# Patient Record
Sex: Female | Born: 1945 | Race: White | Hispanic: No | Marital: Married | Smoking: Current some day smoker
Health system: Southern US, Community
[De-identification: ages and names within clinical notes are randomized; demographics above are authoritative.]

## PROBLEM LIST (undated history)

## (undated) DIAGNOSIS — E785 Hyperlipidemia, unspecified: Secondary | ICD-10-CM

## (undated) DIAGNOSIS — E611 Iron deficiency: Secondary | ICD-10-CM

## (undated) DIAGNOSIS — M1990 Unspecified osteoarthritis, unspecified site: Secondary | ICD-10-CM

## (undated) DIAGNOSIS — H3530 Unspecified macular degeneration: Secondary | ICD-10-CM

## (undated) DIAGNOSIS — J449 Chronic obstructive pulmonary disease, unspecified: Secondary | ICD-10-CM

## (undated) DIAGNOSIS — Z923 Personal history of irradiation: Secondary | ICD-10-CM

## (undated) DIAGNOSIS — M5136 Other intervertebral disc degeneration, lumbar region: Secondary | ICD-10-CM

## (undated) DIAGNOSIS — R000 Tachycardia, unspecified: Secondary | ICD-10-CM

## (undated) DIAGNOSIS — I2699 Other pulmonary embolism without acute cor pulmonale: Secondary | ICD-10-CM

## (undated) DIAGNOSIS — C50919 Malignant neoplasm of unspecified site of unspecified female breast: Secondary | ICD-10-CM

## (undated) DIAGNOSIS — K219 Gastro-esophageal reflux disease without esophagitis: Secondary | ICD-10-CM

## (undated) DIAGNOSIS — K5792 Diverticulitis of intestine, part unspecified, without perforation or abscess without bleeding: Secondary | ICD-10-CM

## (undated) HISTORY — DX: Malignant neoplasm of unspecified site of unspecified female breast: C50.919

## (undated) HISTORY — DX: Diverticulitis of intestine, part unspecified, without perforation or abscess without bleeding: K57.92

## (undated) HISTORY — DX: Other pulmonary embolism without acute cor pulmonale: I26.99

## (undated) HISTORY — DX: Unspecified macular degeneration: H35.30

## (undated) HISTORY — DX: Hyperlipidemia, unspecified: E78.5

## (undated) HISTORY — PX: TONSILLECTOMY: SUR1361

## (undated) HISTORY — DX: Gastro-esophageal reflux disease without esophagitis: K21.9

## (undated) HISTORY — DX: Chronic obstructive pulmonary disease, unspecified: J44.9

## (undated) HISTORY — DX: Tachycardia, unspecified: R00.0

## (undated) HISTORY — PX: BACK SURGERY: SHX140

## (undated) HISTORY — DX: Unspecified osteoarthritis, unspecified site: M19.90

## (undated) HISTORY — PX: BREAST LUMPECTOMY: SHX2

## (undated) HISTORY — DX: Iron deficiency: E61.1

## (undated) HISTORY — DX: Other intervertebral disc degeneration, lumbar region: M51.36

## (undated) SURGERY — Surgical Case
Anesthesia: *Unknown

---

## 1968-06-02 HISTORY — PX: LAPAROSCOPIC TUBAL LIGATION: SUR803

## 2001-06-02 DIAGNOSIS — Z923 Personal history of irradiation: Secondary | ICD-10-CM

## 2001-06-02 HISTORY — DX: Personal history of irradiation: Z92.3

## 2001-10-28 ENCOUNTER — Ambulatory Visit: Admission: RE | Admit: 2001-10-28 | Discharge: 2002-01-26 | Payer: Self-pay | Admitting: Radiation Oncology

## 2003-05-22 ENCOUNTER — Encounter (HOSPITAL_COMMUNITY): Admission: RE | Admit: 2003-05-22 | Discharge: 2003-08-20 | Payer: Self-pay | Admitting: Hematology and Oncology

## 2004-06-06 ENCOUNTER — Encounter (HOSPITAL_COMMUNITY): Admission: RE | Admit: 2004-06-06 | Discharge: 2004-07-06 | Payer: Self-pay

## 2004-07-25 ENCOUNTER — Encounter (HOSPITAL_COMMUNITY): Admission: RE | Admit: 2004-07-25 | Discharge: 2004-08-24 | Payer: Self-pay

## 2004-08-26 ENCOUNTER — Encounter (HOSPITAL_COMMUNITY): Admission: RE | Admit: 2004-08-26 | Discharge: 2004-09-25 | Payer: Self-pay

## 2006-06-25 ENCOUNTER — Encounter: Admission: RE | Admit: 2006-06-25 | Discharge: 2006-06-25 | Payer: Self-pay | Admitting: Hematology and Oncology

## 2006-12-15 ENCOUNTER — Ambulatory Visit (HOSPITAL_COMMUNITY): Admission: RE | Admit: 2006-12-15 | Discharge: 2006-12-15 | Payer: Self-pay | Admitting: Internal Medicine

## 2007-11-18 ENCOUNTER — Inpatient Hospital Stay (HOSPITAL_COMMUNITY): Admission: EM | Admit: 2007-11-18 | Discharge: 2007-11-25 | Payer: Self-pay | Admitting: Emergency Medicine

## 2009-05-10 ENCOUNTER — Ambulatory Visit (HOSPITAL_COMMUNITY): Admission: RE | Admit: 2009-05-10 | Discharge: 2009-05-10 | Payer: Self-pay | Admitting: Internal Medicine

## 2009-05-31 ENCOUNTER — Ambulatory Visit (HOSPITAL_COMMUNITY): Admission: RE | Admit: 2009-05-31 | Discharge: 2009-05-31 | Payer: Self-pay | Admitting: Internal Medicine

## 2009-10-24 ENCOUNTER — Ambulatory Visit (HOSPITAL_COMMUNITY): Admission: RE | Admit: 2009-10-24 | Discharge: 2009-10-24 | Payer: Self-pay | Admitting: Internal Medicine

## 2010-06-23 ENCOUNTER — Encounter: Payer: Self-pay | Admitting: Internal Medicine

## 2010-10-15 ENCOUNTER — Ambulatory Visit: Payer: Self-pay | Admitting: Cardiology

## 2010-10-15 NOTE — Group Therapy Note (Signed)
NAME:  [name], [name] NO.:  [id number]   MEDICAL RECORD NO.:  [id number]          PATIENT TYPE:  INP   LOCATION:  A320                          FACILITY:  [facility]   PHYSICIAN:  Catalina Pizza, M.D.        DATE OF BIRTH:  1945/06/17   DATE OF PROCEDURE:  11/23/2007  DATE OF DISCHARGE:                                 PROGRESS NOTE   SUBJECTIVE:  [name] is a 65 year old white female with multiple  large PAs who was out to a telemetry bed at this time out of ICU.  She  overall is feeling much better, some occasional pain in her chest with  deep inspiration, but no other new complaints.  Eating and drinking  better.  Still does not had a bowel movement at this time.   OBJECTIVE:  VITAL SIGNS:  Temperature is 98.4, blood pressure 118/65,  pulse 93, respirations 16, sating 93% on room air.  GENERAL:  This is a thin white female lying in bed in no acute distress.  HEENT:  Unremarkable.  LUNGS:  Clear to auscultation bilaterally, showing good air movement  throughout.  Did not appreciate any rhonchi or crackles.  HEART:  Regular rate and rhythm.  No murmurs, gallops or rubs.  ABDOMEN:  Soft, nontender, nondistended, positive bowel sounds.  EXTREMITIES:  No lower extremity edema.  NEUROLOGIC:  Alert and oriented x3.  No deficits noted.   LABORATORY DATA:  PT is 20.5, INR 1.7.  Urine culture from a several  days ago shows greater than 100,000 colonies of different morphology, no  specific growth.  Anemia panel just showed RBC of 3.25, still awaiting  full hemoglobin/hematocrit at this time.  Chest x-ray revealed  subsegmental atelectasis in the right upper lobe, COPD type findings as  well.   IMPRESSION:  This is a 65 year old white female with new onset pulmonary  emboli.   ASSESSMENT AND PLAN:  1. Pulmonary emboli, INR is becoming close to therapeutic.  We will      bump up her Coumadin again at 10 mg today and continue with the      Lovenox through today as well.  2. Tachycardia.  This improved.  Continue on the Cardizem  3. Hemoptysis no further signs.  4. Anemia.  She continues to have anemia, but awaiting a CBC or H&H      today to see if it is significantly trended down further unclear      whether she is having any signs of bleed, but if it is lower than      was yesterday, we will get a CT scan of her abdomen to look for      retroperitoneal type bleed also awaiting a stool to see if she has      any blood in her stool as well.  5. Gastroesophageal reflux disease.  Continue on the Protonix.  6. History of diverticulitis.  No further signs at this time.  7. Mild dizziness.  This is improved and does not have any further      problems.   DISPOSITION:  The patient once therapeutic on Coumadin, will likely be  able to go back to home either tomorrow or Thursday as long as her blood  count is remaining stable.  Her diet has improved and encouraged to keep  restraints up.  Work with physical therapy as well.      Catalina Pizza, M.D.  Electronically Signed     ZH/MEDQ  D:  11/23/2007  T:  11/24/2007  Job:  [id number]

## 2010-10-15 NOTE — Group Therapy Note (Signed)
NAME:  [name], [name] NO.:  [id number]   MEDICAL RECORD NO.:  [id number]          PATIENT TYPE:  INP   LOCATION:  A320                          FACILITY:  [facility]   PHYSICIAN:  Catalina Pizza, M.D.        DATE OF BIRTH:  06-09-1945   DATE OF PROCEDURE:  11/24/2007  DATE OF DISCHARGE:                                 PROGRESS NOTE   SUBJECTIVE:  [name] is a 65 year old white female with multiple  large pulmonary emboli who is overall doing much better, ambulating  more, eating and drinking well.  Denies any specific complaints.  Does  note some pain with deep inspiration which has continued but overall  much better.   PHYSICAL EXAMINATION:  VITAL SIGNS:  Temperature is 98.3, blood pressure  101/55, pulse 81, respirations 16-22, and sating 95% on room air.  GENERAL:  This is a white female lying in bed, in no acute distress.  HEENT:  Unremarkable.  LUNGS:  Clear to auscultation bilaterally.  No rhonchi or wheezing.  HEART:  Regular rate and rhythm.  No murmurs, gallops, or rubs.  ABDOMEN:  Soft, nontender, nondistended, and positive bowel sounds.  EXTREMITIES:  No lower extremity edema.  NEUROLOGIC:  Alert and oriented x3.  No deficits noted.   LABORATORY DATA:  Anemia panel from yesterday full results show B12 of  455, folate of 10.2, percent saturation of iron 16, total iron of 29,  and total iron binding capacity of 187.  CBC shows a white count of 7.1,  hemoglobin 10.4, and platelet count of 407.  PT was 19.3 and INR of 1.6.  BMET shows sodium 141, potassium 4.0, chloride 103, CO2 30, glucose 113,  BUN 8, creatinine 0.58, and calcium of 9.2.   IMPRESSION:  This is a 65 year old white female with new-onset pulmonary  emboli.   PLAN:  1. Pulmonary emboli, still not therapeutic, unclear why her Coumadin      bumped down.  We will continue on the 10 mg daily and we will      recheck PT/INR in the morning.  Continue with Lovenox for now.  Did      instruct the  patient on Lovenox use and may be able to be sent home      with Lovenox if not therapeutic tomorrow.  2. Tachycardia, this is improved.  Continue on the Cardizem.  3. Hemoptysis, unclear exact etiology, but has not had any further      problems since initially.  May have been initially related to      pulmonary emboli.  4. Anemia.  CBC did show a bump up to 10.4.  This has remained stable      enough for the last 2 days.  She does have some signs of iron      deficiency but given her low TIBC as well, it could be a chronic      disease type picture as well.  Given her history of constipation,      we will hold off on the iron in the short term but  likely resume      once the patient is eating and drinking normally back at home.  5. Gastroesophageal reflux disease.  Continue on Protonix.  6. History of diverticulitis, no new signs of this.   DISPOSITION:  The patient will continue on her current therapy and will  change her pain regimen to oral Vicodin at this time.  We will continue  with physical therapy and likely will need home health set up at the  time of discharge to recheck PT/INR on routine basis depending on  whether she is therapeutic or not.  I appreciate Dr. Juanetta Gosling' input and  per his report it suggests to continue on the Coumadin therapy for least  6 months, may need to be longer than that.  Given the degree of her  pulmonary emboli, we will definitely not resume her on her Evista at the  time of discharge.      Catalina Pizza, M.D.  Electronically Signed     ZH/MEDQ  D:  11/24/2007  T:  11/25/2007  Job:  [id number]

## 2010-10-15 NOTE — Group Therapy Note (Signed)
NAME:  [name], [name] NO.:  [id number]   MEDICAL RECORD NO.:  [id number]          PATIENT TYPE:  INP   LOCATION:  IC03                          FACILITY:  [facility]   PHYSICIAN:  Catalina Pizza, M.D.        DATE OF BIRTH:  April 07, 1946   DATE OF PROCEDURE:  11/20/2007  DATE OF DISCHARGE:                                 PROGRESS NOTE   SUBJECTIVE:  [name] is a 65 year old white female who presented  with chest pain and shortness of breath, and found to have a significant  large PEs in the right and left lung.  She was seen and assessed by Dr.  Juanetta Gosling yesterday in consultation and again this morning.  Overall, [name] has improved both of chest pain and discomfort at some point as  well as her breathing.  She continues to have some difficulty, but  overall improved.  She is eating and drinking some, and does not have as  much nausea and abdominal pain still, but does have some bloating and  occasional burping sensation with mild cough.  She does also mentioned  that she did have cough with a trace of red blood, pinkish in nature,  noted this morning.   OBJECTIVE:  VITAL SIGNS:  Temperature is 97.3, blood pressure is 115/62,  pulse is ranging between 95 up to 120, most recently at approximately  105, and oxygen saturation 98% on 2 L of oxygen.  GENERAL:  This is a thin white female lying in bed in no acute distress.  HEENT:  Unremarkable.  LUNGS:  Showed okay air movement anteriorly, but question diminished at  the posterior of the right lung.  She does continue to have some  pleuritic-type chest pain with deep breathing.  No significant  tachypnea.  No other rhonchi or wheezing noted.  ABDOMEN:  Soft and nontender, positive bowel sounds.  HEART:  Regular rate and rhythm.  No murmurs, gallops, or rubs.  NEUROLOGIC:  The patient is alert and oriented x3.  No deficits.  EXTREMITIES:  On lower extremities, decreased pain on palpation in her  right calf, question quads  felt, but also has same type situation in the  left leg.  Right does appear to be mildly larger than the left, but no  significant swelling.   LABORATORY DATA:  CBC shows a white count of 11.0 and hemoglobin of  10.9.  BMET, sodium 133, potassium 3.9, chloride 97, CO2 of 30, glucose  128, BUN 90, creatinine 0.53, calcium of 9.4, PT is 14.6, and INR of  1.1.   IMPRESSION:  This is a 65 year old white female with new onset of  pulmonary emboli.   ASSESSMENT AND PLAN:  1. Pulmonary emboli, unknown exact cause of  immobility and recent      hospitalization.  Also, the patient started back on Evista which      may have contributed to clot formation.  Continue with the Lovenox      injections full dose q.12 h. as well as the Coumadin, we will      increase  the dose to 10 mg today.  We will continue O2 to help with      pain management.  We will also continue with morphine or Percocet      for severe pain.  2. Dr. Juanetta Gosling also follow along and appreciate his input.  3. Tachycardia.  Dr. Juanetta Gosling resumed her Cardizem back to several      doses per day, though not the long-acting as she was on before      which he will continue to monitor this.  4. Question hemoptysis.  The patient did have a trace of blood in her      sputum per report.  Unclear, whether this is a result of damage to      her lungs related to the pulmonary emboli plus her Coumadin therapy      contributed to it or whether there is some other etiology that      causing this.  5. Anemia.  Her hemoglobin has dropped down to 10.9 from 12.3.  No      appreciable signs of blood loss, but we will guaiac all stools.  6. Gastroesophageal reflux disease/belching.  The patient does have      some stomach irritation which may have been related to her recent      gastrointestinal issues, but we will treat with Protonix IV b.i.d.      for now.  7. History of diverticulitis.  No new signs or issues related to this.   DISPOSITION:  We  will continue in ICU for now and we will try to  encourage increased eating as well as getting up into a chair, probably  the patient will be here for another 2 or 3 days while being therapeutic  on her Coumadin.      Catalina Pizza, M.D.  Electronically Signed     ZH/MEDQ  D:  11/20/2007  T:  11/21/2007  Job:  [id number]

## 2010-10-15 NOTE — H&P (Signed)
[name], [name]                ACCOUNT NO.:  [id number]   MEDICAL RECORD NO.:  [id number]          PATIENT TYPE:  INP   LOCATION:  A317                          FACILITY:  [facility]   PHYSICIAN:  Angus G. Renard Matter, MD   DATE OF BIRTH:  [birth year]   DATE OF ADMISSION:  11/18/2007  DATE OF DISCHARGE:  LH                              HISTORY & PHYSICAL   ADDENDUM   REVIEW OF SYSTEMS:  HEENT:  Negative.  CARDIOPULMONARY:  The patient has  had some dyspnea and wheezing.  GI:  No vomiting, no diarrhea, no  melena.  GU:  No dysuria or hematuria.   PHYSICAL EXAMINATION:  GENERAL:  Alert white female complaining of chest  pain, sitting in upright position.  VITAL SIGNS:  Blood pressure 111/63, respirations 20, and pulse 106.  HEENT:  Eyes, PERRLA.  TM negative.  Oropharynx benign.  NECK:  Supple.  No JVD or thyroid abnormalities.  LUNGS:  Clear to P and A.  Diminished breath sounds.  HEART:  Regular rhythm.  No murmurs.  ABDOMEN:  No palpable organs or masses.  No organomegaly.  EXTREMITIES:  The patient does have tenderness in both calves  bilaterally.  NEUROLOGIC:  No focal deficit.   DIAGNOSES:  1. Pulmonary embolization probably from deep vein thrombosis.  2. Recent history of diverticulitis, treated conservatively.   ADDITIONAL LAB WORK:  Blood gases, pH of 7.455 with a PCO2 36.5, PO2  59.6, and bicarb 25.3.  Chem shows sodium 137, potassium 3.7, chloride  101, CO2 29, glucose 134, BUN 13, creatinine 0.60.  CBC, WBC 11,400,  hemoglobin 12.3, and hematocrit 36.2.  D-dimer 5, derivatives 11.33.   PLAN:  To start anticoagulation, Coumadin 5 mg, Lovenox 1 mg/kg b.i.d.  Nasal O2.      Angus G. Renard Matter, MD  Electronically Signed     AGM/MEDQ  D:  11/18/2007  T:  11/19/2007  Job:  [id number]

## 2010-10-15 NOTE — Group Therapy Note (Signed)
NAMENEERA, TENG                ACCOUNT NO.:  [id number]   MEDICAL RECORD NO.:  [id number]          PATIENT TYPE:  INP   LOCATION:  IC03                          FACILITY:  [facility]   PHYSICIAN:  Edward L. Juanetta Gosling, M.D.DATE OF BIRTH:  November 17, 1945   DATE OF PROCEDURE:  11/22/2007  DATE OF DISCHARGE:                                 PROGRESS NOTE   Ms. Revolorio seems to be doing better than yesterday.  She has had some  more cardiac arrhythmia but overall feels better.  She is taking deeper  breaths.  She is not having as much pain.  She has no new complaints.  No hemoptysis.  No chest pain except for some pleuritic type pain.   PHYSICAL EXAMINATION:  VITAL SIGNS:  Her heart rates in the 90s and  blood pressure 130 systolic.  CHEST:  Clear.  HEART:  Occasional extrasystole.  ABDOMEN:  Soft.   She wants to know about taking some eye drops from home that she says is  for some sort of a retinal problem.  I do not think it should interfere  with anything, so I think that is okay.   LABORATORY DATA:  Her lab work today shows that her BMET, CO2 is 33,  glucose is 111, otherwise normal.  CBC; white count 6400, hemoglobin  9.5, and platelets 316.  Protime is 19.5 with an INR of 1.6.   ASSESSMENT:  She has had some cardiac arrhythmias.  I think pretty much  to be expected in someone who has had massive pulmonary emboli,  particularly someone who has had cardiac arrhythmias in the past.  She  has problems with some sort of retinal difficulty, and she is on eye  drops for that.  I think it is okay for her to take at home.  She has  pulmonary emboli and seems to be making progress with that.  She is  starting to get anticoagulated with Coumadin.      Edward L. Juanetta Gosling, M.D.  Electronically Signed     ELH/MEDQ  D:  11/22/2007  T:  11/22/2007  Job:  [id number]

## 2010-10-15 NOTE — Group Therapy Note (Signed)
[name], [name]                ACCOUNT NO.:  [id number]   MEDICAL RECORD NO.:  [id number]          PATIENT TYPE:  INP   LOCATION:  IC03                          FACILITY:  [facility]   PHYSICIAN:  Edward L. Juanetta Gosling, M.D.DATE OF BIRTH:  [birth year]   DATE OF PROCEDURE:  DATE OF DISCHARGE:                                 PROGRESS NOTE   PROBLEM:  Extensive pulmonary emboli.   SUBJECTIVE:  [name] says she is feeling a little bit better.  She  is able to move around a little bit more.  She has no other new  complaints.  In addition to her previously noted hospitalizations and  bedrest she also has had risk factor of taking Evista that may have  increase her risk of pulmonary emboli.  Today, she says she is able to  move a little better.  She looks better.  She is more comfortable.  Her  heart rate still about 120.  She has been on Cardizem so I am going to  restart that.  Her blood pressure in the 120s, O2 sats in the mid 90s on  2 liters.  Her chest clear.  She is moving air better.   Her CBC shows a white count 11,000, hemoglobin is 10.9, and platelets of  330.  BMET:  BUN is 9, creatinine 0.53, and prothrombin time 14.61, and  INR of 1.1.   ASSESSMENT:  She is better.   PLAN:  To continue with treatments as follow with no changes today.  I  will add her Cardizem back, and I went ahead and did a short-acting  version for now.      Edward L. Juanetta Gosling, M.D.  Electronically Signed     ELH/MEDQ  D:  11/20/2007  T:  11/20/2007  Job:  [id number]

## 2010-10-15 NOTE — Consult Note (Signed)
[name], [name]                ACCOUNT NO.:  [id number]   MEDICAL RECORD NO.:  [id number]          PATIENT TYPE:  INP   LOCATION:  IC03                          FACILITY:  [facility]   PHYSICIAN:  Edward L. Juanetta Gosling, M.D.DATE OF BIRTH:  [birth year]   DATE OF CONSULTATION:  11/19/2007  DATE OF DISCHARGE:                                 CONSULTATION   REASON FOR CONSULTATION:  Bilateral pulmonary emboli.   HISTORY:  [name] is a 65 year old who had been in fairly good  health until about a month ago.  About a month ago, she developed  abdominal discomfort, went to the [facility], she was admitted  there, was being treated with antibiotics, but continued having  discomfort, was eventually transferred to [facility] where she was treated.  There was some question at that time  about whether she had a blood clot somewhere, that may have been in her  bladder area.  She had an ultrasound done that was negative for clots,  so she was treated simply with typical subcutaneous prophylactic dose of  what sounds like heparin, she said she was taking it 3 times a day.  Since then, she was discharged home, then she developed severe knee pain  in her left knee, went to see Dr. Arletha Grippe, an orthopedic surgeon, who  thought she might have gout, aspirated her knee, he did not find any  evidence of gout, and he injected her knee with steroids.  Several days  after that, she developed severe back pain, which went up into her  chest.  She then about 36 hours ago developed pain in her right side  mostly in the chest, it became quite severe.  She came to the emergency  room where she was found to have pulmonary emboli.  She came to the  emergency room where she was admitted.  Since then, she has had  increasing problems with pain and was transferred to the ICU because of  her increasing pain.  She says she is still having significant pain.  She is short of breath.  She is not  able to lie flat.   PAST MEDICAL HISTORY:  Positive for a history of breast cancer.  She  received chemotherapy and radiation post the breast cancer.  She is not  on any of these oral agents.  She has a history of rapid heartbeat and  takes Cardizem for that.  She has a history of hyperlipidemia, for which  she takes Lipitor.   REVIEW OF SYSTEMS:  Except as mentioned is negative.   FAMILY HISTORY:  Her father had cardiac disease and had thick blood  and ended up on Coumadin as well.  Her mother had excellent physical  health, but had Alzheimer's and died from that.   SOCIAL HISTORY:  She does not smoke now, stopped about a month ago when  she got sick, but has a significant smoking history.   PHYSICAL EXAMINATION:  GENERAL:  She appears to be mildly uncomfortable  now.  VITAL SIGNS:  Pulse 102, blood pressure 108/64, and respirations about  20.  HEENT:  Pupils are reactive.  Her nose and throat are clear.  NECK:  Supple without masses.  CHEST:  Fairly clear.  HEART:  Regular without gallop.  ABDOMEN:  Soft.  EXTREMITIES:  No edema.  She has some tenderness in the lower  extremities bilaterally.  I do not feel a definite cord.  CNS:  Grossly intact.   LABORATORY DATA:  White blood count is 11,400, hemoglobin is 12.3, and  platelets 402.  Amylase 43.  BMET; glucose is 134, otherwise normal.  Lipase was 21.  Blood gas on room air showed a pO2 of 59, pCO2 of 36,  and pH 7.45.  D-dimer was 11.33.  Urinalysis negative.  BNP was 46.9.  Protime this morning, INR was 1.1 and protime of 14.2.  Her CT angiogram  of the chest is strongly positive with large filling defects in both  pulmonary arteries and large saddle embolus in the right pulmonary  artery extending into the right upper lobe, right lower lobe, and right  middle lobe branches.  I expect that she has probably got some pulmonary  infarction as well based on the fact that she should have an infiltrate.  If she becomes febrile,  she needs antibiotics.   ASSESSMENT:  She has bilateral pulmonary emboli, which are large,  probably precipitated by her being hospitalized.  She is on Lovenox,  which is appropriate.  She is on Coumadin, which is appropriate.  I do  not think there is anything to add at this point.  She is  hemodynamically stable, so she is not a candidate I think for  prophylaxis and would watch hemodynamics carefully and continue with her  treatments.  She is already started on Coumadin, which is appropriate.  She may need a workup for hypercoagulable state, but I do think she has  a reasonable precipitating cause with this hospitalization.  She has  medications for pain which is also appropriate.  I do not think there is  anything else to add at this point.   Thank you very much for allowing me to see her with you.      Edward L. Juanetta Gosling, M.D.  Electronically Signed     ELH/MEDQ  D:  11/19/2007  T:  11/20/2007  Job:  [id number]

## 2010-10-15 NOTE — Group Therapy Note (Signed)
[name], [name]                ACCOUNT NO.:  [id number]   MEDICAL RECORD NO.:  [id number]          PATIENT TYPE:  INP   LOCATION:  A320                          FACILITY:  [facility]   PHYSICIAN:  Edward Diana. Juanetta Gosling, M.D.DATE OF BIRTH:  01-19-1946   DATE OF PROCEDURE:  11/23/2007  DATE OF DISCHARGE:                                 PROGRESS NOTE   PATIENT OF:  Catalina Pizza, MD   [name] is better.  She is up and moving around.  She has no new  complaints.   PHYSICAL EXAMINATION:  VITAL SIGNS:  Today shows; temperature 97.8,  pulse 90, respirations 20, blood pressure 129/68, and O2 sats 94% on  room air.  Her PT is 20.5 with an INR of 1.7.   ASSESSMENT:  She has had pulmonary emboli.  She is recovering from that  and seems to be doing very well, and I am going to plan to follow her  more peripherally.   Thanks for allowing me to see her with you.  She will need 6 months of  outpatient oral anticoagulation.      Edward Diana. Juanetta Gosling, M.D.  Electronically Signed     ELH/MEDQ  D:  11/23/2007  T:  11/23/2007  Job:  [id number]

## 2010-10-15 NOTE — H&P (Signed)
[name], [name]                ACCOUNT NO.:  [id number]   MEDICAL RECORD NO.:  [id number]          PATIENT TYPE:  INP   LOCATION:  A317                          FACILITY:  [facility]   PHYSICIAN:  Angus G. Renard Matter, MD   DATE OF BIRTH:  [birth year]   DATE OF ADMISSION:  11/18/2007  DATE OF DISCHARGE:  LH                              HISTORY & PHYSICAL   A 65 year old white female presented to the ED with chief complaint  being discomfort in her chest and shortness of breath.  Apparently, this  began today although she had noticed some pain in the left knee and had  seen Dr. Pecola Lawless with reference to this and noted some pain in both  lower legs she has not had any hemoptysis, but said some dyspnea.  Apparently, she was admitted to the hospital both in [place] and at  [facility] for treatment of diverticulitis.  This apparently was  conservative treat with antibiotics no surgery.  The patient was  evaluated by ED physician.  CT of the chest was done following elevation  of D-dimer and regular chest x-ray.  Chest x-ray showed bronchitic  changes and scattered area of parenchymal lung scarring left suprahilar  nodular density.  Chest CT showed evidence of pulmonary embolization.  The patient was given Lovenox in the emergency department and was  started on nasal O2 and subsequently admitted.   MEDICAL HISTORY:  The patient has history of breast cancer.  She has had  previous back surgery.   SOCIAL HISTORY:  She was a former smoker.  Does not use drugs or does  not smoke.   ALLERGIES:  No known no drug allergies.   MEDICATION LIST:  Vicodin p.r.n.   Dictation ended at this point.      Angus G. Renard Matter, MD  Electronically Signed     AGM/MEDQ  D:  11/18/2007  T:  11/19/2007  Job:  [id number]

## 2010-10-15 NOTE — Group Therapy Note (Signed)
NAME:  [name], [name] NO.:  [id number]   MEDICAL RECORD NO.:  [id number]          PATIENT TYPE:  INP   LOCATION:  IC03                          FACILITY:  [facility]   PHYSICIAN:  Catalina Pizza, M.D.        DATE OF BIRTH:  1946-04-12   DATE OF PROCEDURE:  11/22/2007  DATE OF DISCHARGE:                                 PROGRESS NOTE   SUBJECTIVE:  [name] is a 65 year old white female with multiple  large PEs, currently in the ICU.  Overall, feeling much better;  decreased pain in her chest, although she did note one episode of  different pain than she has had.  Overall, eating and drinking well,  able to get out of bed, denies any specific shortness of breath at this  time.  No signs of bleed.  No coughing up blood.   OBJECTIVE:  VITAL SIGNS:  Temperature is afebrile, blood pressure is  ranging between 100/66 to 115/56, respiratory rate 16-18, sating 99% on  2 liters of oxygen.  GENERAL:  This is thin white female, sitting on flatbed, in no acute  distress.  HEENT:  Unremarkable.  LUNGS:  Show somewhat better air movement in the right lower base  compared to the left.  HEART:  Regular rate and rhythm.  No murmurs, gallops, or rubs.  ABDOMEN:  Soft, nontender, nondistended with positive bowel sounds.  EXTREMITIES:  No lower extremity edema.  No significant pain to  palpation in the calf area as before.  NEUROLOGIC:  The patient is alert and oriented x3.  No deficits.   LABORATORY DATA:  BMET shows sodium 139, potassium 4.2, chloride 102,  CO2 33, glucose 111, BUN was 7, creatinine 0.57, and calcium of 9.  CBC  shows white count of 6.4, hemoglobin 9.5, hematocrit 28.1, platelet  count of 316.  PT is 19.5, INR of 1.6.   IMPRESSION:  A 65 year old white female with new onset of pulmonary  emboli.   ASSESSMENT/PLAN:  1. Pulmonary emboli.  Her INR is coming up not quite at the      therapeutic range at this time, but continue with Lovenox and      Coumadin; Evista  was stopped.  2. Tachycardia is much improved, change the Cardizem once daily.  3. Hemoptysis.  No further problems with this.  4. Anemia.  She continued to have this trending down, unclear where      this blood could be going; get guaiac stools and get anemia panel      in the morning, may also get a chest x-ray if continues to trend      down further tomorrow, need to do a CT scan of her abdomen and      pelvis looking for any possible retroperitoneal bleed, although I      am not sure exactly where this would be.  5. Gastroesophageal reflux disease, Protonix is helping.  6. History of diverticulitis, this apparently is better.  No signs of      bleeding as of yet in her stools.  7. Mild dizziness.  This is secondary to debility and possibly some      orthostasis.  As the patient gets more ambulatory, we will      reassess.   DISPOSITION:  The patient will be sent out of the ICU today, and we will  recheck all lab work in the morning.      Catalina Pizza, M.D.  Electronically Signed     ZH/MEDQ  D:  11/22/2007  T:  11/22/2007  Job:  [id number]

## 2010-10-15 NOTE — Discharge Summary (Signed)
NAMEHAYLEEN, [name] NO.:  [id number]   MEDICAL RECORD NO.:  [id number]          PATIENT TYPE:  INP   LOCATION:  A320                          FACILITY:  [facility]   PHYSICIAN:  Catalina Pizza, M.D.        DATE OF BIRTH:  [birth year]   DATE OF ADMISSION:  11/18/2007  DATE OF DISCHARGE:  06/25/2009LH                               DISCHARGE SUMMARY   DISCHARGE DIAGNOSES:  1. Multiple pulmonary emboli.  2. Tachycardia.  3. Hemoptysis.  4. Iron deficiency anemia.  5. Gastroesophageal reflux disease.  6. History of diverticulitis.  7. Chronic obstructive pulmonary disease.   DISCHARGE MEDICATIONS:  1. Folic acid 400 mcg p.o. daily.  2. Cardizem 360 mg p.o. daily.  3. Lipitor 40 mg p.o. daily.  4. Caltrate plus D with extra vitamin D b.i.d.  5. Metamucil 2 tablets p.o. daily.  6. Omega-3 1000 mg p.o. daily.  7. Multivitamin p.o. daily.  8. Vicodin 5/500 one tablet q.6 h. p.r.n. #40 given.  9. Coumadin 10 mg p.o. daily at 6:00 p.m. daily.  10.Lovenox 70 mg subcu two times a day until instructed otherwise.  11.Protonix 40 mg 1 tablet daily.   BRIEF HISTORY OF PRESENT ILLNESS:  Ms. [name] is a pleasant 65 year old  white female who had a recent prolonged hospitalization approximately 8-  10 days seen at  [facility] and at [facility], had some leg  discomfort bilaterally and woke up on November 18, 2007, with worsening  shortness of breath.  She came into the emergency department and was  found to have multiple large pulmonary emboli and was admitted for  further anticoagulation therapy.   PHYSICAL EXAM AT TIME OF DISCHARGE:  VITAL SIGNS:  Temperature is 98.3,  blood pressure 121/66, pulse 83, respirations 16, sating 95% on room  air.  GENERAL:  This is a white female lying in bed in no acute distress.  HEENT:  Unremarkable.  Pupils equal, round and reactive to light and  accommodation.  LUNGS:  Showed clear to auscultation bilaterally.  No rhonchi or  wheezing.   Does have improved aeration at the bases as compared to  initial exam.  Decrease pleuritic chest pain with deep inspiration.  HEART:  Regular rate and rhythm.  No murmurs, gallops or rubs.  ABDOMEN:  Soft, does have some mild suprapubic tenderness but mostly  related to when her body is in full extension, seems  musculoskeletal in  nature, is nondistended and positive bowel sounds.    EXTREMITIES:  No lower extremity edema.  NEUROLOGIC:  Alert and oriented x3.  No deficits noted.   LABORATORY DATA OBTAINED DURING HOSPITALIZATION:  Initial CBC showed a  white count of 11.4, hemoglobin of 12.3, platelet count of 402,  hemoglobin trended down to as low as 9.5 and day before discharge,  hemoglobin was stable at 10.4.  Her initial ABG obtained on room air  showed pH of 7.45, pCO2 of 36.5, pO2 of 59.6.  Initial D-dimer was  11.33.  Anemia panel obtained showed iron level 29, total iron binding  capacity of 187, percent saturation  of 16, vitamin B12 of 455, folate of  10.2, ferritin of 197.  PT at time of discharge 22.0, INR of 1.8.   SCANS OBTAINED DURING HOSPITALIZATION:  Initial chest x-ray revealed  bronchiectatic changes with scattered parenchymal lung scarring and  question of left suprahilar nodular density.  CT scan showed extensive  pulmonary emboli involving right upper, middle and lower lobes and left  lower lobe minimal infiltrate right lung base and right pleural  effusion, underlying chronic changes.  I did not appreciate any other  findings seen on chest x-ray.  Of note, did mention a large saddle  emboli in the bifurcation of right pulmonary artery.  A follow-up chest  x-ray showed subsegmental atelectasis on right upper lobe and COPD.   HOSPITAL COURSE PER PROBLEM:  1. Pulmonary emboli.  The patient was started on full anticoagulation      with Lovenox and Coumadin.  I did get Dr. Juanetta Gosling opinion as well      and we will continue with the Coumadin therapy.  She has been  slow      to increase with this and the patient does know the risks of and      the need, importance of being therapeutic on her Coumadin between 2      and 3, would aim for goals 2.5 given the extensive nature of her      pulmonary emboli bleeding clinically that it may have been a      combination of Evista, as well as sedentary nature of recent      illness that caused these venous thrombosis and pulmonary embolis      likely related to her legs.  She did have an increased right leg      compared to the left initially on exam when she came into the      hospital but this resolved at the time of discharge.  The patient      will likely need to be on Coumadin for at least 6 months likely      closer to 9-12 months depending on improvement in this.  May want      to reassess to assure that these emboli are improving after 4-6      weeks as far as reabsorption.  She is given some Vicodin for a      pleuritic type chest pain.  She did require several doses of      morphine during hospitalization for increasing her inspiration but      otherwise is doing well.  2. Tachycardia.  She had tachycardia initially likely related to      pulmonary emboli.  She does have a history of tachycardia and was      on Cardizem for this and we will continue this.  She is baseline      approximately 80-90 during hospitalization with occasional going up      in the 130s, but has resolved at the time of discharge.  3. Hemoptysis.  She did have signs of coughing up blood initially only      small amounts and only lasted for less than a day.  She has not had      any further episodes since then.  4. Anemia.  In reviewing her anemia panel, it did show that she has      iron deficiency anemia.  We will likely start her on some iron once      the patient is getting back  to normal.  Her hemoglobins remained      stable approximately 10.4 drop from 12.3-10.4, may have been      secondary to multiple blood draws, as  well as dilutional given the      fact she was getting fluids initially.  Unclear exact baseline for      this.  5. Gastroesophageal reflux disease/belching.  She was started on      Protonix and this is much improved.  6. History of diverticulitis.  She continues to do well.  She has had      some constipation and did have bowel movement yesterday. Continue      with routine treatment of this.  Her diverticulitis was adequately      treated and has not had any fevers signs at this time but did      recommend from [facility] that she get a follow-up colonoscopy which      we will arrange with Dr. Loreta Ave in next 4-6 weeks.  I do not feel it      is urgently important given the fact that she is on Coumadin right      now.  We will need to decide how to move forward with this.   DISPOSITION:  The patient will be discharged on only mentioned above  medicines on Lovenox that she will be educated on and give herself  daily.  She is to get home health nursing to come out and assist the  patient with this and check a PT/INR tomorrow, as well as Monday and  adjust the dose appropriately.  She is to continue on her Lovenox  until alerted not to. We will reassess on Monday per phone conversation  and see when the patient is to follow-up with me in the office.  The  patient is aware of all this and is in agreement that she will do better  at home.  We will need to keep close follow-up on this.      Catalina Pizza, M.D.  Electronically Signed     ZH/MEDQ  D:  11/25/2007  T:  11/25/2007  Job:  [id number]

## 2010-10-15 NOTE — Group Therapy Note (Signed)
NAME:  [name], [name] NO.:  [id number]   MEDICAL RECORD NO.:  [id number]          PATIENT TYPE:  INP   LOCATION:  IC03                          FACILITY:  [facility]   PHYSICIAN:  Catalina Pizza, M.D.        DATE OF BIRTH:  07-Jun-1945   DATE OF PROCEDURE:  DATE OF DISCHARGE:                                 PROGRESS NOTE   Of note, history and physical was performed by Dr. Renard Matter, but is  incomplete halfway through Dr. Renard Matter aware of this.   SUBJECTIVE:  [name] continues to have chest pain and discomfort  related to multiple large PEs, which she has.  She continues to have  pain in her legs bilaterally right greater than left.  She is also  having some anxiety issues as well.   BRIEF HISTORY:  The patient was at [facility] for 8-10 days and  went home approximately 1 week ago, was seen for the first time in my  office 2 days ago and with complain of some bilateral musculoskeletal  type pain and seeing Dr. Arletha Grippe for this.  She is continuing to have  fatigue apparently per Dr. Renard Matter' note.  She started having some  shortness of breath and chest discomfort and came to the Emergency  Department for further assessment and was found to have multiple  pulmonary emboli.  She was admitted, put on Lovenox, and admitted to the  third floor.   PHYSICAL EXAMINATION:  VITAL SIGNS:  Temperature is 97.8, blood pressure  is 96/57, pulse 103, respirations 20, satting 97% on room air.  GENERAL:  This is a white female sitting in chair in no acute distress.  HEENT:  Unremarkable.  Pupils equally round and reactive  to light and  accommodation.  LUNGS:  Clear to auscultation bilaterally.  Question with some mild  crackles at the bases bilaterally.  She did have some pleuritic type  chest pain with deep breathing, right greater than left.  ABDOMEN:  Soft, nontender, nondistended, positive bowel sounds.  HEART:  Regular rate and rhythm.  No murmurs, gallops, or rubs.  NEUROLOGIC:  The patient alert and oriented x3, some mild anxiety and  discussion with the patient.   Laboratory data was obtained, but not in dictation. White count of 11.4,  hemoglobin of 12.3, platelet count of 402,000, amylase of 43.  BMET only  showed mildly elevated glucose of 134, lipase was 21.  ABG did show pH  of 7.45, pCO2 of 36.5, low pO2 of 59.6.  D-Dimer was very elevated at  11.3.  UA showed small amount of blood, but specific gravity greater  than 1.030.  BNP was 46.9.  PT/INR was 13.7, INR of 1.0.   A CT angio of chest did reveal pulmonary emboli, but did have extensive  pulmonary emboli involving right upper, middle, and lower lobes and left  lower lobe, minimal infiltrate in the right lung base and right pleural  effusion.  In further looking at this, she has a large saddle embolus  identified at the bifurcation of  right pulmonary artery extending  to  the right upper lobe, right lower lobe, and right middle lobe branches.   IMPRESSION:  This is a 65 year old white female with recent  hospitalization for diverticulitis who has extensive pulmonary emboli.   ASSESSMENT/PLAN:  Pulmonary emboli/large saddle emboli.  This is life-  threatening in nature and will move the patient to the Intensive Care  unit.  She is to continue on the Lovenox for full anticoagulation as  well as Coumadin therapy.  We will place on telemetry and continue to  monitor her.  She is to limit her mobility in fear that small additional  emboli could tip over this large saddle emboli.  The patient is aware of  the severity of this condition.  We will get Dr. Juanetta Gosling to offer up any  further input from a pulmonary perspective.  She continues to have some  shortness of breath very likely to the large degree of pulmonary emboli  and should improve.  We will continue to monitor and continue with O2 on  a p.r.n. basis for this.  It is unclear exact cause of this.  We will  get lower extremity  ultrasound to further assess this, but do not feel  this is going to change our therapy at this time.  She does have history  of breast cancer and cancer risks do increase risk for embolization, so  we will need further workup as we move forward.   Significant tobacco use/question of chronic obstructive pulmonary  disease.  She has significant tobacco use, smoking approximately 2 packs  daily for 35 years up until several weeks ago when she went to [employer]  We will continue to treat on a p.r.n. basis for symptoms.   Recent history of diverticulitis.  She does not have any significant  problems at this time related to this, but still in recovery from recent  hospitalization.   DISPOSITION:  The patient as mentioned above will move to ICU and  continue to monitor and we will get therapeutic and treat  symptomatically with morphine on a p.r.n. basis.      Catalina Pizza, M.D.  Electronically Signed     ZH/MEDQ  D:  11/19/2007  T:  11/20/2007  Job:  [id number]

## 2010-10-15 NOTE — Group Therapy Note (Signed)
NAME:  [name], YAGER NO.:  [id number]   MEDICAL RECORD NO.:  [id number]          PATIENT TYPE:  INP   LOCATION:  IC03                          FACILITY:  [facility]   PHYSICIAN:  Catalina Pizza, M.D.        DATE OF BIRTH:  11-19-1945   DATE OF PROCEDURE:  DATE OF DISCHARGE:                                 PROGRESS NOTE   SUBJECTIVE:  [name] is a 65 year old white female with multiple  large PEs, currently in the ICU.  Overall, she is doing better.  She  still continues to have some right upper chest pain when leaning back  but breathing is okay, denies any abdominal pain or appetite, maybe  improving just mildly she only had a very trace amount of pinkish tinge  in sputum with cough yesterday evening but no further bright red blood  per report.  No other signs of bleeding at this time.   OBJECTIVE:  VITAL SIGNS:  Temperature is 97.6, blood pressures ranging  97/64 to 113/54, respiratory rate 16, and sating 99% on 2 liters of  oxygen.  GENERAL:  This is a thin white female lying in bed in no acute distress.  HEENT:  Unremarkable.  LUNGS:  Air movement heard throughout.  Question decrease on the right  side compared to the left in the posterior aspect anteriorly sounds good  with no appreciating rhonchi or wheezing.  HEART:  Regular rate and rhythm.  No murmurs, gallops, or rubs.  ABDOMEN:  Soft, nontender, and nondistended.  Positive bowel sounds.  EXTREMITIES:  No lower extremity edema.  No significant pain to  palpation in the calf area as before.  NEUROLOGIC:  The patient is alert and oriented x3.  No deficits.   LABORATORY DATA:  CBC shows white count of 7.9, hemoglobin 10.2, and  platelet count of 324.  PT is 14.9 and INR is 1.1   IMPRESSION:  A 65 year old white female with new onset of pulmonary  emboli.   ASSESSMENT AND PLAN:  1. Pulmonary emboli.  Continue with the Coumadin and Lovenox.  No      further Evista, continue treat pain on a p.r.n. basis or  in      significant amount.   1. Tachycardia is much improved.  We will change her back to her daily      Cardizem at this time and continue to monitor and see how she does      on that.   1. Hemoptysis.  She has not had any further problems with this. We      will continue to monitor.   1. Anemia.  She continues to have slow trend down in her hemoglobin.      We will recheck again in the morning.   1. Gastroesophageal reflux disease and belching.  Started on Protonix      and will continue that, will likely need to change to be her oral      regimen at this time.   1. History of diverticulitis.  No new issues.  Apparent, will do  Guaiac stools to see if she had any signs of diverticular-type      bleed that causing slight decrease in her anemia scheduled to have      a colonoscopy, and in another 4-6 weeks, we will need to discuss      given the findings of pulmonary emboli on Coumadin therapy.   1. Mild dizziness.  She may have a little bit of orthostasis due to      blood pressure medicines and immobility, we will get physical      therapy to work with the patient tomorrow and get her out of ICU.      Catalina Pizza, M.D.  Electronically Signed     ZH/MEDQ  D:  11/21/2007  T:  11/22/2007  Job:  [id number]

## 2010-10-15 NOTE — Group Therapy Note (Signed)
NAMEJANIT, CUTTER                ACCOUNT NO.:  [id number]   MEDICAL RECORD NO.:  [id number]          PATIENT TYPE:  INP   LOCATION:  IC03                          FACILITY:  [facility]   PHYSICIAN:  Edward L. Juanetta Gosling, M.D.DATE OF BIRTH:  10/20/1945   DATE OF PROCEDURE:  11/21/2007  DATE OF DISCHARGE:                                 PROGRESS NOTE   Ms. Rewerts seems to be getting better in general.  She says that she  has less shortness of breath and less chest discomfort.  She seems to be  improving.  She is able to take deeper breaths.  Her heart rate is much  improved with the addition of Cardizem.  She is not having any  hemoptysis.  She is not complaining of any leg pain.  She still has  trouble lying flat.   PHYSICAL EXAMINATION:  VITAL SIGNS:  Her heart rate in the 90s and blood  pressure 120/70.  CHEST:  Clear.  She does still have some decreased breath sounds.  HEART:  Regular with occasional extrasystole.  EXTREMITIES:  She has only minimal if any edema.   LABORATORY DATA:  CBC shows white count 7900, hemoglobin is 10.2, and  platelets 324.  PT 14.9 with an INR of 1.1.   ASSESSMENT:  Overall, I think she is improving.  She has bilateral large  pulmonary emboli, so she is at risk for some lung damage.  She also  probably has some chronic obstructive pulmonary disease from years of  smoking.  Otherwise, she is doing about the same.  I think things are  going about as well we can expect.      Edward L. Juanetta Gosling, M.D.  Electronically Signed     ELH/MEDQ  D:  11/22/2007  T:  11/22/2007  Job:  [id number]

## 2010-10-29 ENCOUNTER — Ambulatory Visit: Payer: Self-pay | Admitting: Cardiology

## 2010-11-06 ENCOUNTER — Ambulatory Visit: Payer: Self-pay | Admitting: Cardiology

## 2010-11-20 ENCOUNTER — Other Ambulatory Visit: Payer: Self-pay | Admitting: *Deleted

## 2010-11-21 ENCOUNTER — Encounter: Payer: Self-pay | Admitting: Cardiology

## 2010-11-21 ENCOUNTER — Other Ambulatory Visit: Payer: Self-pay

## 2010-11-21 ENCOUNTER — Ambulatory Visit (INDEPENDENT_AMBULATORY_CARE_PROVIDER_SITE_OTHER): Payer: Medicare HMO | Admitting: Cardiology

## 2010-11-21 VITALS — BP 120/70 | HR 99 | Ht 69.0 in | Wt 157.0 lb

## 2010-11-21 DIAGNOSIS — R000 Tachycardia, unspecified: Secondary | ICD-10-CM

## 2010-11-21 DIAGNOSIS — I495 Sick sinus syndrome: Secondary | ICD-10-CM

## 2010-11-21 MED ORDER — DILTIAZEM HCL ER COATED BEADS 180 MG PO CP24: 180.0000 mg | ORAL_CAPSULE | Freq: Every day | ORAL | Status: DC

## 2010-11-21 NOTE — Patient Instructions (Signed)
Your physician has recommended you make the following change in your medication: Stop taking all Vitamins except for Calcium and Vitamin D  Your physician recommends that you schedule a follow-up appointment in: as needed

## 2010-11-21 NOTE — Progress Notes (Signed)
HPI [name] is a delightful 65 year old white female is referred by [name] for fast and slow heart rate.  About 12 years ago, she had a drop in her heart rate into the 30s and felt weak. She checked her pulse with a machine. She was seen by Dr. Eliberto Ivory, her primary care, who basically reassured her. In 2003 prior to a colonoscopy, her heart rate was noted to be in the 120s. She was evaluated by our team at [facility] and remembers having a nuclear scan and possibly an echocardiogram. She was placed on diltiazem and went ahead and had the colonoscopy. There's been no followup since.  She's had periods where she's noted heart rate to be above 100. Again this was with her blood pressure monitor. She is not sure if this are regular.  She had one episode this one her in [place] where she felt weak but she did not have any way to check her pulse.  She saw [name] recently. He decreased her diltiazem and half. She's currently on 180 mg a day. She has felt better and her heart rate is now in the 80s.  She denies any angina or chest pain. He denies any syncope or palpitations.  Recent blood work showed a normal potassium. A TSH was not checked but she has no symptoms of thyroid disease.  The echocardiogram today shows normal sinus rhythm with a borderline first degree AV block with RSR prime in V1 and V2. QTC is normal at 425 ms Past Medical History  Diagnosis Date  . Pulmonary embolism   . Tachycardia   . Hemoptysis   . Iron deficiency   . GERD (gastroesophageal reflux disease)   . Diverticulitis   . COPD (chronic obstructive pulmonary disease)   . Breast cancer   . Hyperlipidemia   . Hypertension     Past Surgical History  Procedure Date  . Breast lumpectomy   . Back surgery     No family history on file.  History   Social History  . Marital Status: Married    Spouse Name: N/A    Number of Children: N/A  . Years of Education: N/A   Occupational History  . Not on file.    Social History Main Topics  . Smoking status: Current Everyday Smoker    Types: Cigarettes  . Smokeless tobacco: Never Used  . Alcohol Use: No  . Drug Use: No  . Sexually Active: Not on file   Other Topics Concern  . Not on file   Social History Narrative  . No narrative on file    Allergies  Allergen Reactions  . Sulfa Antibiotics     Current Outpatient Prescriptions  Medication Sig Dispense Refill  . aspirin 81 MG tablet Take 81 mg by mouth daily.        . Bimatoprost (LUMIGAN) 0.01 % SOLN Apply 1 drop to eye daily.        . calcium carbonate (OS-CAL) 600 MG TABS Take 600 mg by mouth daily.        . Cholecalciferol (VITAMIN D) 1000 UNITS capsule Take 1,000 Units by mouth daily.        . diclofenac (VOLTAREN) 75 MG EC tablet Take 75 mg by mouth daily.        . niacin (NIASPAN) 500 MG CR tablet Take 500 mg by mouth at bedtime.        Marland Kitchen omeprazole (PRILOSEC) 20 MG capsule Take 20 mg by mouth daily.        Marland Kitchen  simvastatin (ZOCOR) 40 MG tablet Take 40 mg by mouth at bedtime.        Marland Kitchen DISCONTD: Coenzyme Q10 (COQ10) 100 MG CAPS Take 1 capsule by mouth daily.        Marland Kitchen DISCONTD: fish oil-omega-3 fatty acids 1000 MG capsule Take 1 g by mouth daily.        Marland Kitchen DISCONTD: Lutein 20 MG CAPS Take 1 capsule by mouth daily.        Marland Kitchen DISCONTD: Multiple Vitamins-Minerals (MULTIVITAMIN WITH MINERALS) tablet Take 1 tablet by mouth daily.        Marland Kitchen DISCONTD: psyllium (METAMUCIL) 58.6 % powder Take 1 packet by mouth daily.        Marland Kitchen DISCONTD: vitamin C (ASCORBIC ACID) 500 MG tablet Take 500 mg by mouth daily.        Marland Kitchen DISCONTD: COMBIGAN 0.2-0.5 % ophthalmic solution       . DISCONTD: diltiazem (CARDIZEM CD) 180 MG 24 hr capsule Take 180 mg by mouth daily.        Marland Kitchen DISCONTD: latanoprost (XALATAN) 0.005 % ophthalmic solution         ROS Negative other than HPI.   PE General Appearance: well developed, well nourished in no acute distress HEENT: symmetrical face, PERRLA, good dentition  Neck: no  JVD, thyromegaly, or adenopathy, trachea midline Chest: symmetric without deformity Cardiac: PMI non-displaced, RRR, normal S1, S2, no gallop or murmur Lung: clear to ausculation and percussion Vascular: all pulses full without bruits  Abdominal: nondistended, nontender, good bowel sounds, no HSM, no bruits Extremities: no cyanosis, clubbing or edema, no sign of DVT, no varicosities  Skin: normal color, no rashes Neuro: alert and oriented x 3, non-focal Pysch: normal affect Filed Vitals:   11/21/10 1403  BP: 120/70  Pulse: 99  Height: 5\' 9"  (1.753 m)  Weight: 157 lb (71.215 kg)  SpO2: 95%    EKG  Labs and Studies Reviewed.   No results found for this basename: WBC, HGB, HCT, MCV, PLT      Chemistry   No results found for this basename: NA, K, CL, CO2, BUN, CREATININE, GLU   No results found for this basename: CALCIUM, ALKPHOS, AST, ALT, BILITOT       No results found for this basename: CHOL   No results found for this basename: HDL   No results found for this basename: LDLCALC   No results found for this basename: TRIG   No results found for this basename: CHOLHDL   No results found for this basename: HGBA1C   No results found for this basename: ALT, AST, GGT, ALKPHOS, BILITOT   No results found for this basename: TSH

## 2010-11-21 NOTE — Assessment & Plan Note (Signed)
It is obviously not clear what she is having. Regardless, she is better today has felt better since her diltiazem was decreased. Her exam is normal.  I have made no changes today in her program. I showed her how to check her heart rate with her carotid pulse. We've also talked about regularity versus irregularity. Watchful waiting approach will be initial strategy. If she begins to have recurrent symptoms will see her back in place a monitor. She was fine with this approach.

## 2010-11-29 ENCOUNTER — Encounter: Payer: Self-pay | Admitting: Cardiology

## 2010-12-03 ENCOUNTER — Other Ambulatory Visit (HOSPITAL_COMMUNITY)
Admission: RE | Admit: 2010-12-03 | Discharge: 2010-12-03 | Disposition: A | Discharge status: Home / Self Care | Payer: Medicare HMO | Source: Ambulatory Visit | Attending: Obstetrics & Gynecology | Admitting: Obstetrics & Gynecology

## 2010-12-03 ENCOUNTER — Other Ambulatory Visit: Payer: Self-pay | Admitting: Obstetrics & Gynecology

## 2010-12-03 DIAGNOSIS — Z01419 Encounter for gynecological examination (general) (routine) without abnormal findings: Secondary | ICD-10-CM | POA: Insufficient documentation

## 2011-02-27 LAB — URINALYSIS, ROUTINE W REFLEX MICROSCOPIC
Bilirubin Urine: NEGATIVE
Glucose, UA: NEGATIVE
Ketones, ur: NEGATIVE
Leukocytes, UA: NEGATIVE
Nitrite: NEGATIVE
Protein, ur: NEGATIVE
Specific Gravity, Urine: >1.03 — ABNORMAL HIGH
Urobilinogen, UA: 0.2
pH: 5

## 2011-02-27 LAB — LIPASE, BLOOD: Lipase: 21

## 2011-02-27 LAB — AMYLASE: Amylase: 43

## 2011-02-27 LAB — CBC
HCT: 30.2 — ABNORMAL LOW
HCT: 30.4 — ABNORMAL LOW
HCT: 30.8 — ABNORMAL LOW
HCT: 36.3
Hemoglobin: 10.2 — ABNORMAL LOW
Hemoglobin: 10.4 — ABNORMAL LOW
Hemoglobin: 12.3
Hemoglobin: 9.5 — ABNORMAL LOW
MCHC: 33.6
MCHC: 33.8
MCHC: 33.8
MCHC: 33.9
MCV: 92.1
MCV: 92.8
MCV: 93
MCV: 93.5
MCV: 93.7
Platelets: 330
Platelets: 402 — ABNORMAL HIGH
Platelets: 407 — ABNORMAL HIGH
RBC: 3.02 — ABNORMAL LOW
RBC: 3.22 — ABNORMAL LOW
RBC: 3.34 — ABNORMAL LOW
RBC: 3.87
RDW: 13.6
RDW: 13.7
RDW: 13.8
RDW: 13.8
RDW: 13.9
WBC: 11.4 — ABNORMAL HIGH
WBC: 7.1

## 2011-02-27 LAB — D-DIMER, QUANTITATIVE

## 2011-02-27 LAB — DIFFERENTIAL
Basophils Absolute: 0
Basophils Absolute: 0.1
Basophils Absolute: 0.1
Basophils Relative: 0
Basophils Relative: 1
Basophils Relative: 1
Basophils Relative: 1
Eosinophils Absolute: 0.1
Eosinophils Absolute: 0.1
Eosinophils Absolute: 0.2
Eosinophils Absolute: 0.2
Eosinophils Relative: 1
Eosinophils Relative: 1
Eosinophils Relative: 2
Eosinophils Relative: 3
Eosinophils Relative: 3
Lymphocytes Relative: 22
Lymphocytes Relative: 25
Lymphocytes Relative: 32
Lymphs Abs: 2.4
Lymphs Abs: 2.5
Monocytes Absolute: 0.5
Monocytes Absolute: 0.6
Monocytes Absolute: 0.6
Monocytes Absolute: 0.8
Monocytes Relative: 10
Monocytes Relative: 7
Monocytes Relative: 7
Monocytes Relative: 8
Monocytes Relative: 9
Neutro Abs: 5.2
Neutro Abs: 7.8 — ABNORMAL HIGH
Neutro Abs: 7.9 — ABNORMAL HIGH
Neutrophils Relative %: 69
Neutrophils Relative %: 71

## 2011-02-27 LAB — BASIC METABOLIC PANEL
BUN: 9
CO2: 30
CO2: 30
CO2: 33 — ABNORMAL HIGH
Calcium: 9.2
Calcium: 9.4
Chloride: 102
Chloride: 97
Creatinine, Ser: 0.53
Creatinine, Ser: 0.58
GFR calc Af Amer: >60
GFR calc Af Amer: >60
Glucose, Bld: 111 — ABNORMAL HIGH
Glucose, Bld: 113 — ABNORMAL HIGH
Glucose, Bld: 128 — ABNORMAL HIGH
Sodium: 139

## 2011-02-27 LAB — URINE CULTURE

## 2011-02-27 LAB — BLOOD GAS, ARTERIAL
Acid-Base Excess: 1.7
Bicarbonate: 25.3 — ABNORMAL HIGH
FIO2: 21
O2 Content: 21
O2 Saturation: 89.5
Patient temperature: 37
TCO2: 22.5
pCO2 arterial: 36.5
pH, Arterial: 7.455 — ABNORMAL HIGH
pO2, Arterial: 59.6 — ABNORMAL LOW

## 2011-02-27 LAB — URINE MICROSCOPIC-ADD ON

## 2011-02-27 LAB — IRON AND TIBC
Saturation Ratios: 16 — ABNORMAL LOW
UIBC: 158

## 2011-02-27 LAB — BASIC METABOLIC PANEL WITH GFR
BUN: 13
CO2: 29
Calcium: 9.6
Chloride: 101
Creatinine, Ser: 0.6
GFR calc non Af Amer: >60
Glucose, Bld: 134 — ABNORMAL HIGH
Potassium: 3.7
Sodium: 137

## 2011-02-27 LAB — PROTIME-INR
INR: 1
INR: 1.1
Prothrombin Time: 13.7
Prothrombin Time: 20.5 — ABNORMAL HIGH

## 2011-02-27 LAB — RETICULOCYTES: Retic Count, Absolute: 22.8

## 2011-02-27 LAB — FOLATE: Folate: 10.2

## 2011-02-27 LAB — FERRITIN: Ferritin: 197 (ref 10–291)

## 2011-02-27 LAB — B-NATRIURETIC PEPTIDE (CONVERTED LAB): Pro B Natriuretic peptide (BNP): 46.9

## 2011-10-09 ENCOUNTER — Encounter (INDEPENDENT_AMBULATORY_CARE_PROVIDER_SITE_OTHER): Payer: Self-pay | Admitting: *Deleted

## 2011-11-06 ENCOUNTER — Encounter (INDEPENDENT_AMBULATORY_CARE_PROVIDER_SITE_OTHER): Payer: Self-pay | Admitting: *Deleted

## 2011-11-06 ENCOUNTER — Other Ambulatory Visit (INDEPENDENT_AMBULATORY_CARE_PROVIDER_SITE_OTHER): Payer: Self-pay | Admitting: *Deleted

## 2011-11-06 ENCOUNTER — Telehealth (INDEPENDENT_AMBULATORY_CARE_PROVIDER_SITE_OTHER): Payer: Self-pay | Admitting: *Deleted

## 2011-11-06 DIAGNOSIS — Z1211 Encounter for screening for malignant neoplasm of colon: Secondary | ICD-10-CM

## 2011-11-06 NOTE — Telephone Encounter (Signed)
Patient needs movi prep 

## 2011-11-07 MED ORDER — PEG-KCL-NACL-NASULF-NA ASC-C 100 G PO SOLR: 1.0000 | Freq: Once | ORAL | Status: DC

## 2011-11-25 ENCOUNTER — Encounter: Payer: Medicare Other | Admitting: Hematology and Oncology

## 2011-12-10 ENCOUNTER — Encounter (INDEPENDENT_AMBULATORY_CARE_PROVIDER_SITE_OTHER): Payer: Self-pay | Admitting: *Deleted

## 2011-12-23 ENCOUNTER — Encounter: Payer: Medicare Other | Admitting: Hematology and Oncology

## 2011-12-30 ENCOUNTER — Other Ambulatory Visit: Payer: Self-pay | Admitting: Physician Assistant

## 2012-01-01 HISTORY — PX: COLONOSCOPY: SHX174

## 2012-01-09 ENCOUNTER — Telehealth (INDEPENDENT_AMBULATORY_CARE_PROVIDER_SITE_OTHER): Payer: Self-pay | Admitting: *Deleted

## 2012-01-09 NOTE — Telephone Encounter (Signed)
PCP/Requesting MD: hall  Name & DOB: Stephannie Franzel 05/06/1946     Procedure: tcs  Reason/Indication:  screening  Has patient had this procedure before?  yes  If so, when, by whom and where?  2003  Is there a family history of colon cancer?  no  Who?  What age when diagnosed?    Is patient diabetic?   borderline      Does patient have prosthetic heart valve?  no  Do you have a pacemaker?  no  Has patient had joint replacement within last 12 months?  no  Is patient on Coumadin, Plavix and/or Aspirin? yes  Medications: see epic  Allergies: see epic  Medication Adjustment: asa 2 days  Procedure date & time: 01/28/12 at 1200

## 2012-01-13 NOTE — Telephone Encounter (Signed)
agree

## 2012-01-14 ENCOUNTER — Encounter (HOSPITAL_COMMUNITY): Payer: Self-pay | Admitting: Pharmacy Technician

## 2012-01-27 MED ORDER — SODIUM CHLORIDE 0.45 % IV SOLN
Freq: Once | INTRAVENOUS | Status: AC
  Administered 2012-01-28: 1000 mL via INTRAVENOUS

## 2012-01-28 ENCOUNTER — Encounter (HOSPITAL_COMMUNITY): Payer: Self-pay | Admitting: *Deleted

## 2012-01-28 ENCOUNTER — Encounter (HOSPITAL_COMMUNITY)
Admission: RE | Disposition: A | Discharge status: Home / Self Care | Payer: Self-pay | Source: Ambulatory Visit | Attending: Internal Medicine

## 2012-01-28 ENCOUNTER — Ambulatory Visit (HOSPITAL_COMMUNITY)
Admission: RE | Admit: 2012-01-28 | Discharge: 2012-01-28 | Disposition: A | Discharge status: Home / Self Care | Payer: Medicare Other | Source: Ambulatory Visit | Attending: Internal Medicine | Admitting: Internal Medicine

## 2012-01-28 DIAGNOSIS — K5730 Diverticulosis of large intestine without perforation or abscess without bleeding: Secondary | ICD-10-CM | POA: Insufficient documentation

## 2012-01-28 DIAGNOSIS — E785 Hyperlipidemia, unspecified: Secondary | ICD-10-CM | POA: Insufficient documentation

## 2012-01-28 DIAGNOSIS — Q438 Other specified congenital malformations of intestine: Secondary | ICD-10-CM

## 2012-01-28 DIAGNOSIS — J4489 Other specified chronic obstructive pulmonary disease: Secondary | ICD-10-CM | POA: Insufficient documentation

## 2012-01-28 DIAGNOSIS — J449 Chronic obstructive pulmonary disease, unspecified: Secondary | ICD-10-CM | POA: Insufficient documentation

## 2012-01-28 DIAGNOSIS — Z1211 Encounter for screening for malignant neoplasm of colon: Secondary | ICD-10-CM

## 2012-01-28 HISTORY — PX: COLONOSCOPY: SHX5424

## 2012-01-28 SURGERY — COLONOSCOPY
Anesthesia: Moderate Sedation

## 2012-01-28 MED ORDER — MEPERIDINE HCL 50 MG/ML IJ SOLN
INTRAMUSCULAR | Status: DC | PRN
Start: 2012-01-28 — End: 2012-01-28
  Administered 2012-01-28 (×2): 25 mg via INTRAVENOUS

## 2012-01-28 MED ORDER — MIDAZOLAM HCL 5 MG/5ML IJ SOLN
INTRAMUSCULAR | Status: DC | PRN
  Administered 2012-01-28: 1 mg via INTRAVENOUS
  Administered 2012-01-28: 2 mg via INTRAVENOUS
  Administered 2012-01-28: 1 mg via INTRAVENOUS
  Administered 2012-01-28: 2 mg via INTRAVENOUS

## 2012-01-28 MED ORDER — SIMETHICONE 40 MG/0.6ML PO SUSP
ORAL | Status: DC | PRN
  Administered 2012-01-28: 11:00:00

## 2012-01-28 MED ORDER — MEPERIDINE HCL 50 MG/ML IJ SOLN
INTRAMUSCULAR | Status: AC
  Filled 2012-01-28: qty 1

## 2012-01-28 MED ORDER — MIDAZOLAM HCL 5 MG/5ML IJ SOLN
INTRAMUSCULAR | Status: AC
  Filled 2012-01-28: qty 10

## 2012-01-28 NOTE — H&P (Signed)
[name] is an 66 y.o. female.   Chief Complaint: Patient is here for colonoscopy. HPI: Asian is 65 year old Caucasian female was undergoing average risk screening colonoscopy. She denies abdominal pain change in her bowel habits or rectal bleeding. Family history is negative for colorectal carcinoma. Her last exam was incomplete 10 years ago followed by barium enema.  Past Medical History  Diagnosis Date  . Pulmonary embolism   . Tachycardia   . Hemoptysis   . Iron deficiency   . GERD (gastroesophageal reflux disease)   . Diverticulitis   . COPD (chronic obstructive pulmonary disease)   . Breast cancer   . Hyperlipidemia     Past Surgical History  Procedure Date  . Breast lumpectomy   . Back surgery     History reviewed. No pertinent family history. Social History:  reports that she has been smoking Cigarettes.  She has never used smokeless tobacco. She reports that she does not drink alcohol or use illicit drugs.  Allergies:  Allergies  Allergen Reactions  . Sulfa Antibiotics Other (See Comments)    Childhood Allergy     Medications Prior to Admission  Medication Sig Dispense Refill  . beta carotene w/minerals (OCUVITE) tablet Take 1 tablet by mouth daily.      . Bimatoprost (LUMIGAN) 0.01 % SOLN Place 1 drop into both eyes daily.       . calcium carbonate (OS-CAL) 600 MG TABS Take 600 mg by mouth daily.        . Cholecalciferol (VITAMIN D) 1000 UNITS capsule Take 1,000 Units by mouth daily.        Marland Kitchen diltiazem (DILACOR XR) 180 MG 24 hr capsule Take 180 mg by mouth daily.      . niacin (NIASPAN) 500 MG CR tablet Take 500 mg by mouth at bedtime.        Marland Kitchen omeprazole (PRILOSEC) 20 MG capsule Take 20 mg by mouth daily.        . peg 3350 powder (MOVIPREP) 100 G SOLR Take 1 kit (100 g total) by mouth once.  1 kit  0  . simvastatin (ZOCOR) 40 MG tablet Take 40 mg by mouth at bedtime.        Marland Kitchen aspirin 81 MG tablet Take 81 mg by mouth daily.          No results found for  this or any previous visit (from the past 48 hour(s)). No results found.  ROS  Blood pressure 136/88, pulse 93, temperature 97.5 F (36.4 C), temperature source Oral, resp. rate 18, height 5\' 9"  (1.753 m), weight 144 lb (65.318 kg), SpO2 99.00%. Physical Exam  Constitutional: She appears well-developed and well-nourished.  HENT:  Mouth/Throat: Oropharynx is clear and moist.  Eyes: Conjunctivae are normal. No scleral icterus.  Neck: No thyromegaly present.  Cardiovascular: Normal rate, regular rhythm and normal heart sounds.   No murmur heard. Respiratory: Effort normal and breath sounds normal.  GI: Soft. She exhibits no distension and no mass. There is no tenderness.  Lymphadenopathy:    She has no cervical adenopathy.  Neurological: She is alert.  Skin: Skin is warm and dry.     Assessment/Plan Average risk screening colonoscopy.  Ghali Morissette U 01/28/2012, 11:30 AM

## 2012-01-28 NOTE — Op Note (Signed)
COLONOSCOPY PROCEDURE REPORT  PATIENT:  [name]  MR#:  [id number] Birthdate:  1945-12-27, 66 y.o., female Endoscopist:  Dr. Malissa Hippo, MD Referred By:  Dr. Catalina Pizza, MD Procedure Date: 01/28/2012  Procedure:   Colonoscopy  Indications:  Patient is 65 year old Caucasian female was undergoing average risk screening colonoscopy. Her last exam was 10 years ago and was in to be followed by barium enema at an outside facility.  Informed Consent:  The procedure and risks were reviewed with the patient and informed consent was obtained.  Medications:  Demerol 50 mg IV Versed 6 mg IV  Description of procedure:  After a digital rectal exam was performed, that colonoscope was advanced from the anus through the rectum and colon to the area of the cecum, ileocecal valve and appendiceal orifice. The cecum was deeply intubated. These structures were well-seen and photographed for the record. From the level of the cecum and ileocecal valve, the scope was slowly and cautiously withdrawn. The mucosal surfaces were carefully surveyed utilizing scope tip to flexion to facilitate fold flattening as needed. The scope was pulled down into the rectum where a thorough exam including retroflexion was performed.  Findings:   Prep excellent. Tortuous redundant colon. Few large diverticula at sigmoid and descending colon. No colonic polyps or other mucosal abnormalities. Normal rectal mucosa. Thickened anoderm.  Therapeutic/Diagnostic Maneuvers Performed:  None  Complications:  None  Cecal Withdrawal Time:  10 minutes  Impression:  Examination performed to cecum. Tortuous and redundant colon with few diverticula at sigmoid and descending colon.  Recommendations:  Standard instructions given. Next screening exam in 10 years.  Nykia Turko U  01/28/2012 12:18 PM  CC: Dr. Dwana Melena, MD & Dr. Bonnetta Barry ref. provider found

## 2012-01-28 NOTE — Progress Notes (Signed)
Prior to colonoscopy procedure patient voiced concern to 2 RN's about her husbands driving to the hospital for procedure.  She stated that he had run off the road several times ran through at least 3 stop signs and ran into a ditch.  [name] stated to the patient that it did not sound like she had a safe means of transportation for departure from hospital after her procedure.  It was suggested by both [name] and [name] that we call an alternative person to take her home because she would not be able to drive herself.  [name] gave the name and number of her neighbor [name] as a safe way home.  [name] was called and the situation was explained to him of the unsafe driving situation with [name] husband.  [name] came to the hospital to get [name] but the husband sent him home. Therefore, patient was discharged home to the husband.

## 2012-01-30 ENCOUNTER — Encounter (HOSPITAL_COMMUNITY): Payer: Self-pay | Admitting: Internal Medicine

## 2012-01-30 ENCOUNTER — Encounter: Payer: Medicare Other | Admitting: Hematology and Oncology

## 2012-01-30 DIAGNOSIS — C50919 Malignant neoplasm of unspecified site of unspecified female breast: Secondary | ICD-10-CM

## 2012-01-30 DIAGNOSIS — Z171 Estrogen receptor negative status [ER-]: Secondary | ICD-10-CM

## 2012-08-19 ENCOUNTER — Telehealth (HOSPITAL_COMMUNITY): Payer: Self-pay | Admitting: Dietician

## 2012-08-19 NOTE — Telephone Encounter (Signed)
Referral coordinator to send order and records.

## 2012-08-19 NOTE — Telephone Encounter (Addendum)
Received voicemail from pt at 1627. Returned call at [employer]. Pt reports that referral is for low K diet. She reports K levels have been checked every 3 months for past 15 months and K levels are consistently elevated (most recent was 5.7). Pt reports she has "tried everything" to reduce dietary sources of K in her diet. She reports her kidneys are fine. Her glucose levels have improved and she reports that she is very familiar and has followed the diabetic diet closely. SHe is requesting meal plans for low K diet.

## 2012-08-19 NOTE — Telephone Encounter (Signed)
Received telephone request for consult (dx: HTN) from Dr. Marcelo Baldy' office. Appointment scheduled for 08/24/12 at 11 AM.

## 2012-08-24 ENCOUNTER — Encounter (HOSPITAL_COMMUNITY): Payer: Self-pay | Admitting: Dietician

## 2012-08-24 NOTE — Progress Notes (Addendum)
Outpatient Initial Nutrition Assessment  Date:08/24/2012   Appt Start Time: 1103  Referring Physician: [name] Reason for Visit: hyperkalemia/ low potassium diet  Nutrition Assessment:  Height: 5\' 9"  (175.3 cm)   Weight: 144 lb (65.318 kg) (reported)   IBW: 145# %IBW: 99% UBW: 144# %UBW: 100% Body mass index is 21.26 kg/(m^2). Classified as normal weight.  Goal Weight: Maintainence Weight hx: UBW: 144#.  Estimated nutritional needs: 1200-1300 kcals daily, 52-65 grams protein, 1.2-1.3 L fluid daily  PMH:  Past Medical History  Diagnosis Date  . Pulmonary embolism   . Tachycardia   . Hemoptysis   . Iron deficiency   . GERD (gastroesophageal reflux disease)   . Diverticulitis   . COPD (chronic obstructive pulmonary disease)   . Breast cancer   . Hyperlipidemia     Medications:  Current Outpatient Rx  Name  Route  Sig  Dispense  Refill  . aspirin 81 MG tablet   Oral   Take 81 mg by mouth daily.           . beta carotene w/minerals (OCUVITE) tablet   Oral   Take 1 tablet by mouth daily.         . Bimatoprost (LUMIGAN) 0.01 % SOLN   Both Eyes   Place 1 drop into both eyes daily.          . calcium carbonate (OS-CAL) 600 MG TABS   Oral   Take 600 mg by mouth daily.           . Cholecalciferol (VITAMIN D) 1000 UNITS capsule   Oral   Take 1,000 Units by mouth daily.           Marland Kitchen diltiazem (DILACOR XR) 180 MG 24 hr capsule   Oral   Take 180 mg by mouth daily.         . niacin (NIASPAN) 500 MG CR tablet   Oral   Take 500 mg by mouth at bedtime.           Marland Kitchen omeprazole (PRILOSEC) 20 MG capsule   Oral   Take 20 mg by mouth daily.           . simvastatin (ZOCOR) 40 MG tablet   Oral   Take 40 mg by mouth at bedtime.             Labs: CMP     Component Value Date/Time   NA 141 11/24/2007 0530   K 4.0 11/24/2007 0530   CL 103 11/24/2007 0530   CO2 30 11/24/2007 0530   GLUCOSE 113* 11/24/2007 0530   BUN 8 11/24/2007 0530   CREATININE 0.58  11/24/2007 0530   CALCIUM 9.2 11/24/2007 0530   GFRNONAA >60 11/24/2007 0530   GFRAA  Value: >60        The eGFR has been calculated using the MDRD equation. This calculation has not been validated in all clinical 11/24/2007 0530    Lipid Panel  No results found for this basename: chol, trig, hdl, cholhdl, vldl, ldlcalc     No results found for this basename: HGBA1C   Lab Results  Component Value Date   CREATININE 0.58 11/24/2007     Lifestyle/ social habits: [name] is a pleasant woman who lives in [place] with her husband. She is retired.   Nutrition hx/habits: [name] reports struggling with high potassium levels over the past year. She reports that K levels have been checked every 3 months for the past 15 months  and they continue to rise. All other labs are fine. She reports that she was diagnosed with pre-diabetes last years and was able to improve her blood sugars through diet alone. She is very concerned about her potassium levels and wonders why it is high. Per pt, she has not been referred to a nephrologist as all remaining kidney labs are WNL. She was told to work on her diet.  Diet has improved over the past year. She reports following a diabetic diet. Most of her diet is consists of low potassium foods. However, she is concerned because she does not know exactly how much potassium is in those foods. She reads food labels and finds it frustrating when potassium contents are either not included in labels or they are high. She has given up many foods she enjoys, including tomatoes, dairy products, and orange juice, in attempt to lower her potassium levels. She claims that she is hungry all the time. She requested an individualized meal plan prior to this appointment so she can better reach her K goals.   Diet recall: Pt reports eating a well balanced diet. She follows a diabetic diet. She does not use salt substitute (although she admits to using over 3 years ago) ir table salt.  Seasonings are mostly Mrs. Dash and cinnamon.   Nutrition Diagnosis: Nutrition-related knowledge deficit r/t low K diet AEB pt with many diet related questions.   Nutrition Intervention: Nutrition rx: 1500 kcal NAS, no added sugar, low K diet; 3 meals per day; physical activity as tolerated  Education/Counseling Provided: Most of visit was spend on counseling, as pt has a very good understanding of diabetic and low potassium diet principles. Individualized meal plan provided. Discussed with pt importance of portion control- as a low potassium food can become a high potassium food if eaten in large quantities. Discussed ways that pt can include favorite foods in her diet by pairing high K foods with lower K foods throughout the day and being vigilant with portion control. Also discussed hidden sources of potassium in foods and to continue to refrain from using salt substitutes. Also discussed importance of draining canned fruits and vegetables to limit K content. Reinforced importance of reading food labels. Emotional support given. Teach back method used.   Understanding, Motivation, Ability to Follow Recommendations: Expect good compliance.  Monitoring and Evaluation: Goals: 1) Weight maintenance; 2) 30 minutes physical activity 5 times daily; 3) K intake between 2000-2500 mg daily  Recommendations: 1) Watch portion sizes; 2) Keep food diary to better track K intake; 3) Continue to read food labels  F/U: PRN. Provided RD contact information.   Melody Haver, RD, LDN 08/24/2012  Appt EndTime:1140

## 2013-05-04 ENCOUNTER — Encounter (INDEPENDENT_AMBULATORY_CARE_PROVIDER_SITE_OTHER): Payer: Self-pay | Admitting: *Deleted

## 2013-06-27 ENCOUNTER — Ambulatory Visit (INDEPENDENT_AMBULATORY_CARE_PROVIDER_SITE_OTHER): Payer: Medicare HMO | Admitting: Internal Medicine

## 2013-06-27 ENCOUNTER — Encounter (INDEPENDENT_AMBULATORY_CARE_PROVIDER_SITE_OTHER): Payer: Self-pay | Admitting: Internal Medicine

## 2013-06-27 ENCOUNTER — Other Ambulatory Visit (INDEPENDENT_AMBULATORY_CARE_PROVIDER_SITE_OTHER): Payer: Self-pay | Admitting: *Deleted

## 2013-06-27 VITALS — BP 110/70 | HR 74 | Temp 97.9°F | Resp 18 | Ht 69.5 in | Wt 148.6 lb

## 2013-06-27 DIAGNOSIS — K219 Gastro-esophageal reflux disease without esophagitis: Secondary | ICD-10-CM

## 2013-06-27 DIAGNOSIS — K922 Gastrointestinal hemorrhage, unspecified: Secondary | ICD-10-CM

## 2013-06-27 DIAGNOSIS — R1013 Epigastric pain: Secondary | ICD-10-CM | POA: Insufficient documentation

## 2013-06-27 DIAGNOSIS — Z8719 Personal history of other diseases of the digestive system: Secondary | ICD-10-CM | POA: Insufficient documentation

## 2013-06-27 NOTE — Patient Instructions (Signed)
Esophagogastroduodenoscopy to be scheduled.  

## 2013-06-27 NOTE — Progress Notes (Signed)
Presenting complaint;  Burning in throat; epigastric pain and episode of hematemesis.  History of present illness;  Patient is 68 year old Caucasian female who has several year history of GERD and has been on omeprazole. About 3 months ago she developed intractable burning in her throat retrosternum and epigastric region. She noted some relief with OTC NSAIDs. She woke up sick during the night on 02/13/2013 and vomited by mouth blood. She did not experience melena rectal bleeding or postural symptoms. She was seen by Ms. Verlon Au NP and Dr. Josue Hector office on 03/06/2013. Patient was advised to discontinue aspirin and Voltaren. She was taking 81 mg of aspirin daily and 3 doses of Voltaren every week.. she was switched to Nexium at 40 mg by mouth daily for 5 days followed by 20 mg daily. Patient was advised to use liquid antacids on an as-needed basis and also advice to quit cigarette smoking. Patient states turning in her throat retrosternum and epigastric region has improved but has not gone away completely. She believes that these symptoms resulted from using 2 different eyedrops following cataract surgery in July of last year. Now she is only on one type of eyedrops. She states she has had problems with heartburn for 30 years of uncontrolled with therapy. She has never undergone an EGD in the past. She denies dysphagia nausea or vomiting. Her appetite is good and her weight has been stable.  Current Medications: Current Outpatient Prescriptions  Medication Sig Dispense Refill  . dorzolamide-timolol (COSOPT) 22.3-6.8 MG/ML ophthalmic solution Place 1 drop into both eyes 2 (two) times daily.       Marland Kitchen esomeprazole (NEXIUM) 20 MG capsule Take 20 mg by mouth daily at 12 noon.      . Multiple Vitamins-Minerals (PRESERVISION AREDS 2) CAPS Take by mouth daily.      . Omega-3 Fatty Acids (FISH OIL) 1000 MG CAPS Take by mouth daily.      Marland Kitchen aspirin 81 MG tablet Take 81 mg by mouth daily.          No current facility-administered medications for this visit.   Past medical history; Chronic GERD as above. Osteopenia. Last bone density study was in May 2013. She was given prescription for Fosamax by Dr. Sonny Dandy which she only took for a few doses. Hyperlipidemia. Prediabetes. Tonsillectomy at age 75. Bilateral tubal ligation in 1970. Neck surgery for disease in 1992. Lumbar spine surgery for disease possibly in 2000. History of left breast carcinoma in 2003 requiring lumpectomy followed by chemotherapy and radiation therapy and she remains in remission. Diverticulitis in 2009 treated medically. History of pulmonary embolism in 2009. She has bilateral glaucoma. She had left her rectal surgery about 3 years ago. She had a right cataract extraction in July 2014 and retinal surgery in November 2014. She had screening colonoscopy in August 2013 revealing few diverticula in sigmoid and descending colon.  Allergies; Allergies  Allergen Reactions  . Sulfa Antibiotics Other (See Comments)    Childhood Allergy      Family history; Mother was treated for uterine cancer in her 43s but died of MI at age 69. Father had COPD and CAD and died at 48. She has one brother in good health.  Social history; She is married. 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 She Has One Son Age 70 Whose in [place]. She's been smoking cigarettes for 35 years and now smokes half a pack of cigarettes a day and she does not drink alcohol.  Objective: Blood pressure 110/70, pulse 74, temperature 97.9 F (36.6 C), temperature source Oral, resp. rate 18, height 5' 9.5" (1.765 m), weight 148 lb 9.6 oz (67.405 kg). Patient is alert and in no acute distress. Conjunctiva is pink. Sclera is nonicteric Oropharyngeal mucosa is normal. No neck masses or thyromegaly noted. Cardiac exam with regular rhythm normal S1 and S2. No murmur or gallop noted. Lungs are clear to auscultation. Abdomen is  symmetrical. Bowel sounds are normal. No bruits noted. Abdomen is soft with mild midepigastric tenderness but no organomegaly or masses.  No LE edema or clubbing noted.  Assessment:  #1. Single episode of hematemesis over 3 months ago most likely secondary to peptic ulcer disease or Mallory-Weiss tear. She was on low dose aspirin and Voltaren at that time.  #2. Gastroesophageal reflux disease. Not convinced that symptoms triggered with use of  of eyedrops.  Plan:  Patient will continue Nexium at 20 mg by mouth q. A.m. Diagnostic esophagogastroduodenoscopy in the near future.

## 2013-06-29 ENCOUNTER — Encounter (HOSPITAL_COMMUNITY): Payer: Self-pay | Admitting: Pharmacy Technician

## 2013-07-01 ENCOUNTER — Encounter (HOSPITAL_COMMUNITY): Payer: Self-pay | Admitting: *Deleted

## 2013-07-01 ENCOUNTER — Ambulatory Visit (HOSPITAL_COMMUNITY)
Admission: RE | Admit: 2013-07-01 | Discharge: 2013-07-01 | Disposition: A | Discharge status: Home / Self Care | Payer: Medicare HMO | Source: Ambulatory Visit | Attending: Internal Medicine | Admitting: Internal Medicine

## 2013-07-01 ENCOUNTER — Encounter (HOSPITAL_COMMUNITY)
Admission: RE | Disposition: A | Discharge status: Home / Self Care | Payer: Self-pay | Source: Ambulatory Visit | Attending: Internal Medicine

## 2013-07-01 DIAGNOSIS — K449 Diaphragmatic hernia without obstruction or gangrene: Secondary | ICD-10-CM

## 2013-07-01 DIAGNOSIS — K2990 Gastroduodenitis, unspecified, without bleeding: Secondary | ICD-10-CM

## 2013-07-01 DIAGNOSIS — R1013 Epigastric pain: Secondary | ICD-10-CM

## 2013-07-01 DIAGNOSIS — K922 Gastrointestinal hemorrhage, unspecified: Secondary | ICD-10-CM

## 2013-07-01 DIAGNOSIS — K920 Hematemesis: Secondary | ICD-10-CM | POA: Insufficient documentation

## 2013-07-01 DIAGNOSIS — K2100 Gastro-esophageal reflux disease with esophagitis, without bleeding: Secondary | ICD-10-CM | POA: Insufficient documentation

## 2013-07-01 DIAGNOSIS — J449 Chronic obstructive pulmonary disease, unspecified: Secondary | ICD-10-CM | POA: Insufficient documentation

## 2013-07-01 DIAGNOSIS — Z7982 Long term (current) use of aspirin: Secondary | ICD-10-CM | POA: Insufficient documentation

## 2013-07-01 DIAGNOSIS — K2970 Gastritis, unspecified, without bleeding: Secondary | ICD-10-CM

## 2013-07-01 DIAGNOSIS — K219 Gastro-esophageal reflux disease without esophagitis: Secondary | ICD-10-CM

## 2013-07-01 DIAGNOSIS — J4489 Other specified chronic obstructive pulmonary disease: Secondary | ICD-10-CM | POA: Insufficient documentation

## 2013-07-01 HISTORY — PX: ESOPHAGOGASTRODUODENOSCOPY: SHX5428

## 2013-07-01 SURGERY — EGD (ESOPHAGOGASTRODUODENOSCOPY)
Anesthesia: Moderate Sedation

## 2013-07-01 MED ORDER — PANTOPRAZOLE SODIUM 40 MG PO TBEC: 40.0000 mg | DELAYED_RELEASE_TABLET | Freq: Two times a day (BID) | ORAL | Status: DC

## 2013-07-01 MED ORDER — MIDAZOLAM HCL 5 MG/5ML IJ SOLN
INTRAMUSCULAR | Status: DC | PRN
  Administered 2013-07-01 (×3): 2 mg via INTRAVENOUS

## 2013-07-01 MED ORDER — MEPERIDINE HCL 50 MG/ML IJ SOLN
INTRAMUSCULAR | Status: AC
  Filled 2013-07-01: qty 1

## 2013-07-01 MED ORDER — BUTAMBEN-TETRACAINE-BENZOCAINE 2-2-14 % EX AERO
INHALATION_SPRAY | CUTANEOUS | Status: DC | PRN
  Administered 2013-07-01: 2 via TOPICAL

## 2013-07-01 MED ORDER — STERILE WATER FOR IRRIGATION IR SOLN
Status: DC | PRN
  Administered 2013-07-01: 13:00:00

## 2013-07-01 MED ORDER — MIDAZOLAM HCL 5 MG/5ML IJ SOLN
INTRAMUSCULAR | Status: AC
  Filled 2013-07-01: qty 10

## 2013-07-01 MED ORDER — MEPERIDINE HCL 50 MG/ML IJ SOLN
INTRAMUSCULAR | Status: DC | PRN
  Administered 2013-07-01 (×2): 25 mg via INTRAVENOUS

## 2013-07-01 MED ORDER — SODIUM CHLORIDE 0.9 % IV SOLN
INTRAVENOUS | Status: DC
  Administered 2013-07-01: 13:00:00 via INTRAVENOUS

## 2013-07-01 NOTE — Op Note (Signed)
EGD PROCEDURE REPORT  PATIENT:  [name]  MR#:  [id number] Birthdate:  09-19-1945, 68 y.o., female Endoscopist:  Dr. Rogene Houston, MD Referred By:  Dr. Wende Neighbors, MD  Procedure Date: 07/01/2013  Procedure:   EGD  Indications:  Patient is a 68 year old Caucasian female who presents with persistent symptoms of sore throat rash or burning epigastric discomfort and experienced an episode of hematemesis over 3 months ago. She's been on Nexium 20 mg daily without complete resolution of her symptoms. She believes her symptoms were triggered because of eyedrops.            Informed Consent:  The risks, benefits, alternatives & imponderables which include, but are not limited to, bleeding, infection, perforation, drug reaction and potential missed lesion have been reviewed.  The potential for biopsy, lesion removal, esophageal dilation, etc. have also been discussed.  Questions have been answered.  All parties agreeable.  Please see history & physical in medical record for more information.  Medications:  Demerol 50 mg IV Versed 6 mg IV Cetacaine spray topically for oropharyngeal anesthesia  Description of procedure:  The endoscope was introduced through the mouth and advanced to the second portion of the duodenum without difficulty or limitations. The mucosal surfaces were surveyed very carefully during advancement of the scope and upon withdrawal.  Findings:  Esophagus:  Mucosa of the esophagus was normal. Serrated RVV GE junction with 2 erosions. No ring or stricture noted. GEJ:  39 cm Hiatus:  41 cm Stomach:  Stomach was empty and distended very well with insufflation. Folds in the proximal stomach were normal. Examination of mucosa at gastric body was normal. Focal erythema and edema noted in the prepyloric region as well as pyloric channel. Pyloric channel however was patent. Duodenum:  Patchy bulbar erythema and edema. Post bulbar mucosa was normal.  Therapeutic/Diagnostic Maneuvers  Performed:  None  Complications:   None  Impression: Erosive reflux esophagitis with small sliding hiatal hernia. Nonerosive gastroduodenitis.  Recommendations:  Discontinue Nexium. Pantoprazole 40 mg by mouth twice a day for 8 weeks and thereafter once a day. H. pylori serology.  [name]  07/01/2013  1:51 PM  CC: Dr. Delphina Cahill, MD & Dr. Rayne Du ref. provider found

## 2013-07-01 NOTE — H&P (Signed)
[name] is an 68 y.o. female.   Chief Complaint: Patient's here for EGD. HPI: Patient is 68 year old Caucasian female who has history of GERD experienced an episode of hematemesis in September 2014. She believes her symptoms started after she was given 2 different eyedrops and developed a soreness in the throat chest and epigastric region. She has been on a PPI with improvement in her symptoms without complete resolution. She denies dysphagia anorexia or melena. She is a little aspirin but does not take other NSAIDs.  Past Medical History  Diagnosis Date  . Pulmonary embolism   . Tachycardia   . Hemoptysis   . Iron deficiency   . GERD (gastroesophageal reflux disease)   . Diverticulitis   . COPD (chronic obstructive pulmonary disease)   . Breast cancer   . Hyperlipidemia     Past Surgical History  Procedure Laterality Date  . Breast lumpectomy    . Back surgery    . Colonoscopy  01/28/2012    Procedure: COLONOSCOPY;  Surgeon: Rogene Houston, MD;  Location: [facility];  Service: Endoscopy;  Laterality: N/A;  1200  . Colonoscopy  01-2012  . Tonsillectomy      Patient was age 24  . Laparoscopic tubal ligation  1970    Family History  Problem Relation Age of Onset  . Heart disease Mother   . Alzheimer's disease Mother   . Heart disease Father   . Lung disease Father   . Healthy Son    Social History:  reports that she has been smoking Cigarettes.  She has a 15 pack-year smoking history. She has never used smokeless tobacco. She reports that she does not drink alcohol or use illicit drugs.  Allergies:  Allergies  Allergen Reactions  . Sulfa Antibiotics Other (See Comments)    Childhood Allergy     Medications Prior to Admission  Medication Sig Dispense Refill  . CALCIUM PO Take 1 tablet by mouth daily.      . Cholecalciferol (VITAMIN D PO) Take 1 tablet by mouth daily.      . dorzolamide-timolol (COSOPT) 22.3-6.8 MG/ML ophthalmic solution Place 1 drop into both  eyes 2 (two) times daily.       Marland Kitchen esomeprazole (NEXIUM) 20 MG capsule Take 20 mg by mouth daily at 12 noon.      . folic acid (FOLVITE) 1 MG tablet Take 1 mg by mouth daily.      . Multiple Vitamins-Minerals (PRESERVISION AREDS 2) CAPS Take by mouth daily.      . Omega-3 Fatty Acids (FISH OIL) 1000 MG CAPS Take by mouth daily.      Marland Kitchen aspirin 81 MG tablet Take 81 mg by mouth daily.          No results found for this or any previous visit (from the past 48 hour(s)). No results found.  ROS  Blood pressure 131/71, pulse 82, temperature 97.8 F (36.6 C), temperature source Oral, resp. rate 15, height 5' 9.5" (1.765 m), weight 148 lb (67.132 kg), SpO2 96.00%. Physical Exam  Constitutional: She appears well-developed and well-nourished.  HENT:  Mouth/Throat: Oropharynx is clear and moist.  Eyes: Conjunctivae are normal. No scleral icterus.  Neck: No thyromegaly present.  Cardiovascular: Normal rate, regular rhythm and normal heart sounds.   No murmur heard. Respiratory: Effort normal and breath sounds normal.  GI: Soft. She exhibits no distension and no mass. Tenderness: mild midepigastric tenderness.  Musculoskeletal: She exhibits no edema.  Lymphadenopathy:  She has no cervical adenopathy.  Neurological: She is alert.  Skin: Skin is warm and dry.     Assessment/Plan History of hematemesis. Persistent symptoms of GERD and epigastric pain. Diagnostic EGD.  Alisan Dokes U 07/01/2013, 1:24 PM

## 2013-07-01 NOTE — Discharge Instructions (Signed)
Discontinue Nexium but resume other medications as before. Pantoprazole 40 mg by mouth 30 minutes before breakfast and evening meal daily. Resume usual diet. No driving for 24 hours. Physician will contact you with results of blood tests.  Gastroesophageal Reflux Disease, Adult Gastroesophageal reflux disease (GERD) happens when acid from your stomach flows up into the esophagus. When acid comes in contact with the esophagus, the acid causes soreness (inflammation) in the esophagus. Over time, GERD may create small holes (ulcers) in the lining of the esophagus. CAUSES   Increased body weight. This puts pressure on the stomach, making acid rise from the stomach into the esophagus.  Smoking. This increases acid production in the stomach.  Drinking alcohol. This causes decreased pressure in the lower esophageal sphincter (valve or ring of muscle between the esophagus and stomach), allowing acid from the stomach into the esophagus.  Late evening meals and a full stomach. This increases pressure and acid production in the stomach.  A malformed lower esophageal sphincter. Sometimes, no cause is found. SYMPTOMS   Burning pain in the lower part of the mid-chest behind the breastbone and in the mid-stomach area. This may occur twice a week or more often.  Trouble swallowing.  Sore throat.  Dry cough.  Asthma-like symptoms including chest tightness, shortness of breath, or wheezing. DIAGNOSIS  Your caregiver may be able to diagnose GERD based on your symptoms. In some cases, X-rays and other tests may be done to check for complications or to check the condition of your stomach and esophagus. TREATMENT  Your caregiver may recommend over-the-counter or prescription medicines to help decrease acid production. Ask your caregiver before starting or adding any new medicines.  HOME CARE INSTRUCTIONS   Change the factors that you can control. Ask your caregiver for guidance concerning weight loss,  quitting smoking, and alcohol consumption.  Avoid foods and drinks that make your symptoms worse, such as:  Caffeine or alcoholic drinks.  Chocolate.  Peppermint or mint flavorings.  Garlic and onions.  Spicy foods.  Citrus fruits, such as oranges, lemons, or limes.  Tomato-based foods such as sauce, chili, salsa, and pizza.  Fried and fatty foods.  Avoid lying down for the 3 hours prior to your bedtime or prior to taking a nap.  Eat small, frequent meals instead of large meals.  Wear loose-fitting clothing. Do not wear anything tight around your waist that causes pressure on your stomach.  Raise the head of your bed 6 to 8 inches with wood blocks to help you sleep. Extra pillows will not help.  Only take over-the-counter or prescription medicines for pain, discomfort, or fever as directed by your caregiver.  Do not take aspirin, ibuprofen, or other nonsteroidal anti-inflammatory drugs (NSAIDs). SEEK IMMEDIATE MEDICAL CARE IF:   You have pain in your arms, neck, jaw, teeth, or back.  Your pain increases or changes in intensity or duration.  You develop nausea, vomiting, or sweating (diaphoresis).  You develop shortness of breath, or you faint.  Your vomit is green, yellow, black, or looks like coffee grounds or blood.  Your stool is red, bloody, or black. These symptoms could be signs of other problems, such as heart disease, gastric bleeding, or esophageal bleeding. MAKE SURE YOU:   Understand these instructions.  Will watch your condition.  Will get help right away if you are not doing well or get worse. Document Released: 02/26/2005 Document Revised: 08/11/2011 Document Reviewed: 12/06/2010 [facility] Patient Information 2014 [place], [place]. Gastroesophageal Reflux Disease, Adult Gastroesophageal reflux  disease (GERD) happens when acid from your stomach flows up into the esophagus. When acid comes in contact with the esophagus, the acid causes soreness  (inflammation) in the esophagus. Over time, GERD may create small holes (ulcers) in the lining of the esophagus. CAUSES   Increased body weight. This puts pressure on the stomach, making acid rise from the stomach into the esophagus.  Smoking. This increases acid production in the stomach.  Drinking alcohol. This causes decreased pressure in the lower esophageal sphincter (valve or ring of muscle between the esophagus and stomach), allowing acid from the stomach into the esophagus.  Late evening meals and a full stomach. This increases pressure and acid production in the stomach.  A malformed lower esophageal sphincter. Sometimes, no cause is found. SYMPTOMS   Burning pain in the lower part of the mid-chest behind the breastbone and in the mid-stomach area. This may occur twice a week or more often.  Trouble swallowing.  Sore throat.  Dry cough.  Asthma-like symptoms including chest tightness, shortness of breath, or wheezing. DIAGNOSIS  Your caregiver may be able to diagnose GERD based on your symptoms. In some cases, X-rays and other tests may be done to check for complications or to check the condition of your stomach and esophagus. TREATMENT  Your caregiver may recommend over-the-counter or prescription medicines to help decrease acid production. Ask your caregiver before starting or adding any new medicines.  HOME CARE INSTRUCTIONS   Change the factors that you can control. Ask your caregiver for guidance concerning weight loss, quitting smoking, and alcohol consumption.  Avoid foods and drinks that make your symptoms worse, such as:  Caffeine or alcoholic drinks.  Chocolate.  Peppermint or mint flavorings.  Garlic and onions.  Spicy foods.  Citrus fruits, such as oranges, lemons, or limes.  Tomato-based foods such as sauce, chili, salsa, and pizza.  Fried and fatty foods.  Avoid lying down for the 3 hours prior to your bedtime or prior to taking a nap.  Eat  small, frequent meals instead of large meals.  Wear loose-fitting clothing. Do not wear anything tight around your waist that causes pressure on your stomach.  Raise the head of your bed 6 to 8 inches with wood blocks to help you sleep. Extra pillows will not help.  Only take over-the-counter or prescription medicines for pain, discomfort, or fever as directed by your caregiver.  Do not take aspirin, ibuprofen, or other nonsteroidal anti-inflammatory drugs (NSAIDs). SEEK IMMEDIATE MEDICAL CARE IF:   You have pain in your arms, neck, jaw, teeth, or back.  Your pain increases or changes in intensity or duration.  You develop nausea, vomiting, or sweating (diaphoresis).  You develop shortness of breath, or you faint.  Your vomit is green, yellow, black, or looks like coffee grounds or blood.  Your stool is red, bloody, or black. These symptoms could be signs of other problems, such as heart disease, gastric bleeding, or esophageal bleeding. MAKE SURE YOU:   Understand these instructions.  Will watch your condition.  Will get help right away if you are not doing well or get worse. Document Released: 02/26/2005 Document Revised: 08/11/2011 Document Reviewed: 12/06/2010 [facility] Patient Information 2014 [place], [place].

## 2013-07-04 ENCOUNTER — Encounter (HOSPITAL_COMMUNITY): Payer: Self-pay | Admitting: Internal Medicine

## 2013-07-04 LAB — H. PYLORI ANTIBODY, IGG: H Pylori IgG: 0.84 {ISR}

## 2013-10-03 ENCOUNTER — Other Ambulatory Visit (HOSPITAL_COMMUNITY): Payer: Self-pay | Admitting: Internal Medicine

## 2013-10-03 DIAGNOSIS — R042 Hemoptysis: Secondary | ICD-10-CM

## 2013-10-06 ENCOUNTER — Encounter (HOSPITAL_COMMUNITY): Payer: Self-pay

## 2013-10-06 ENCOUNTER — Ambulatory Visit (HOSPITAL_COMMUNITY)
Admission: RE | Admit: 2013-10-06 | Discharge: 2013-10-06 | Disposition: A | Discharge status: Home / Self Care | Payer: Medicare HMO | Source: Ambulatory Visit | Attending: Internal Medicine | Admitting: Internal Medicine

## 2013-10-06 DIAGNOSIS — R042 Hemoptysis: Secondary | ICD-10-CM | POA: Insufficient documentation

## 2013-10-06 DIAGNOSIS — I700 Atherosclerosis of aorta: Secondary | ICD-10-CM | POA: Insufficient documentation

## 2013-10-06 DIAGNOSIS — I7090 Unspecified atherosclerosis: Secondary | ICD-10-CM | POA: Insufficient documentation

## 2014-01-18 ENCOUNTER — Encounter (INDEPENDENT_AMBULATORY_CARE_PROVIDER_SITE_OTHER): Payer: Self-pay | Admitting: *Deleted

## 2014-03-10 ENCOUNTER — Other Ambulatory Visit (HOSPITAL_COMMUNITY): Payer: Self-pay | Admitting: Internal Medicine

## 2014-03-10 DIAGNOSIS — Z1231 Encounter for screening mammogram for malignant neoplasm of breast: Secondary | ICD-10-CM

## 2014-03-15 ENCOUNTER — Ambulatory Visit (HOSPITAL_COMMUNITY): Payer: Medicare HMO

## 2014-03-16 ENCOUNTER — Other Ambulatory Visit (HOSPITAL_COMMUNITY): Payer: Self-pay | Admitting: Internal Medicine

## 2014-03-16 DIAGNOSIS — Z1231 Encounter for screening mammogram for malignant neoplasm of breast: Secondary | ICD-10-CM

## 2014-03-17 ENCOUNTER — Ambulatory Visit (HOSPITAL_COMMUNITY)
Admission: RE | Admit: 2014-03-17 | Discharge: 2014-03-17 | Disposition: A | Discharge status: Home / Self Care | Payer: Medicare HMO | Source: Ambulatory Visit | Attending: Internal Medicine | Admitting: Internal Medicine

## 2014-03-17 DIAGNOSIS — Z1231 Encounter for screening mammogram for malignant neoplasm of breast: Secondary | ICD-10-CM | POA: Diagnosis not present

## 2014-05-30 ENCOUNTER — Encounter (INDEPENDENT_AMBULATORY_CARE_PROVIDER_SITE_OTHER): Payer: Self-pay

## 2014-06-26 ENCOUNTER — Ambulatory Visit (INDEPENDENT_AMBULATORY_CARE_PROVIDER_SITE_OTHER): Payer: Medicare HMO | Admitting: Internal Medicine

## 2014-06-27 ENCOUNTER — Encounter (INDEPENDENT_AMBULATORY_CARE_PROVIDER_SITE_OTHER): Payer: Self-pay | Admitting: Internal Medicine

## 2014-06-27 ENCOUNTER — Ambulatory Visit (INDEPENDENT_AMBULATORY_CARE_PROVIDER_SITE_OTHER): Payer: Medicare HMO | Admitting: Internal Medicine

## 2014-06-27 VITALS — BP 118/70 | HR 74 | Temp 97.0°F | Resp 18 | Ht 69.5 in | Wt 159.0 lb

## 2014-06-27 DIAGNOSIS — K2100 Gastro-esophageal reflux disease with esophagitis, without bleeding: Secondary | ICD-10-CM

## 2014-06-27 DIAGNOSIS — R1013 Epigastric pain: Secondary | ICD-10-CM | POA: Diagnosis not present

## 2014-06-27 MED ORDER — OMEPRAZOLE 40 MG PO CPDR: 40.0000 mg | DELAYED_RELEASE_CAPSULE | Freq: Every day | ORAL | Status: DC

## 2014-06-27 NOTE — Progress Notes (Signed)
Presenting complaint;  Follow-up for GERD.  Subjective:  Patient is 69 year old Caucasian female with history of erosive reflux esophagitis and is here for scheduled visit. She was last seen 1 year ago. Her last visit she was in pantoprazole. She states it didn't work and she has been taking Nexium OTC. However she is using OTC antacids on frequent basis. She believes she use it 15 times this month. He has very good appetite and admits to eating big meals every evening. She has gained 10 pounds since her last visit. She eats her evening meal at 6 PM and goes to bed at 11. She drinks diet green tea and 2-3 cups of coffee a day. She does not drink carbonated drinks. She denies nausea vomiting dysphagia melena or rectal bleeding. She does complain of epigastric pain when she bends forward. She is taking diet overnight but has cut back and is taking 1 pill every other day.   Current Medications: Outpatient Encounter Prescriptions as of 06/27/2014  Medication Sig  . aspirin 81 MG tablet Take 81 mg by mouth daily.    Marland Kitchen CALCIUM PO Take 1 tablet by mouth daily.  . Cholecalciferol (VITAMIN D PO) Take 1 tablet by mouth daily.  . diclofenac (VOLTAREN) 75 MG EC tablet Take 75 mg by mouth. Patient states that she takes 1 by mouth every other day  . dorzolamide-timolol (COSOPT) 22.3-6.8 MG/ML ophthalmic solution Place 1 drop into both eyes 2 (two) times daily.   . Esomeprazole Magnesium (NEXIUM 24HR PO) Take by mouth daily.  . folic acid (FOLVITE) 1 MG tablet Take 1 mg by mouth daily.  . Multiple Vitamins-Minerals (PRESERVISION AREDS 2) CAPS Take by mouth 2 (two) times daily.   . Omega-3 Fatty Acids (FISH OIL) 1000 MG CAPS Take by mouth daily.  . Psyllium (METAMUCIL PO) Take by mouth 2 (two) times daily.  . [DISCONTINUED] pantoprazole (PROTONIX) 40 MG tablet Take 1 tablet (40 mg total) by mouth 2 (two) times daily before a meal. (Patient not taking: Reported on 06/27/2014)     Objective: Blood pressure  118/70, pulse 74, temperature 97 F (36.1 C), temperature source Oral, resp. rate 18, height 5' 9.5" (1.765 m), weight 159 lb (72.122 kg). Patient is alert and in no acute distress. Conjunctiva is pink. Sclera is nonicteric Oropharyngeal mucosa is normal. No neck masses or thyromegaly noted. Cardiac exam with regular rhythm normal S1 and S2. No murmur or gallop noted. Lungs are clear to auscultation. Abdomen is symmetrical. It is soft with mild midepigastric tenderness. No organomegaly or masses. No LE edema or clubbing noted.   Assessment:  #1. Erosive reflux esophagitis. Last EGD was in January 2015. Heartburn is not well controlled with therapy. She has gained 10 pounds since her last visit. To date she has tried omeprazole 20 mg, pantoprazole and Nexium OTC. She does not have alarm symptoms. #2. Epigastric pain appears to be abdominal wall pain as it is more pronounced when she bends or stoops forward. She is on NSAID and therefore could have gastritis or peptic ulcer disease. H pylori serology one year ago was negative.   Plan:  Anti-reflux measures reinforced. She needs to decrease food intake at supper time. Discontinue Nexium as it is not working. Omeprazole 40 mg by mouth every morning. Use OTC antacid on as-needed basis and keep record as to the frequency of its use. Call with progress report in 1 month or earlier if needed. Office visit in 6 months

## 2014-06-27 NOTE — Patient Instructions (Signed)
Keep symptom diary and record the times you use OTC antacid for 1 month and call with progress report. Take omeprazole 40 mg by mouth 30 to 60 minutes daily before breakfast

## 2014-08-07 ENCOUNTER — Telehealth (INDEPENDENT_AMBULATORY_CARE_PROVIDER_SITE_OTHER): Payer: Self-pay | Admitting: *Deleted

## 2014-08-07 NOTE — Telephone Encounter (Signed)
L[place] was to call Dr. Laural Golden back with a progress report. She has taken OTC antiacids (generic liquid for maloxx) 3 times with the [place]. She is much better since she has started taking the Prilosec correctly.

## 2014-08-07 NOTE — Telephone Encounter (Signed)
Dr.Rehman made aware. 

## 2014-10-04 DIAGNOSIS — Z961 Presence of intraocular lens: Secondary | ICD-10-CM | POA: Diagnosis not present

## 2014-10-04 DIAGNOSIS — H3531 Nonexudative age-related macular degeneration: Secondary | ICD-10-CM | POA: Diagnosis not present

## 2014-10-04 DIAGNOSIS — H04123 Dry eye syndrome of bilateral lacrimal glands: Secondary | ICD-10-CM | POA: Diagnosis not present

## 2014-10-04 DIAGNOSIS — H35373 Puckering of macula, bilateral: Secondary | ICD-10-CM | POA: Diagnosis not present

## 2014-10-04 DIAGNOSIS — H02834 Dermatochalasis of left upper eyelid: Secondary | ICD-10-CM | POA: Diagnosis not present

## 2014-10-04 DIAGNOSIS — H40053 Ocular hypertension, bilateral: Secondary | ICD-10-CM | POA: Diagnosis not present

## 2014-10-04 DIAGNOSIS — H02831 Dermatochalasis of right upper eyelid: Secondary | ICD-10-CM | POA: Diagnosis not present

## 2014-12-21 DIAGNOSIS — E782 Mixed hyperlipidemia: Secondary | ICD-10-CM | POA: Diagnosis not present

## 2014-12-21 DIAGNOSIS — I10 Essential (primary) hypertension: Secondary | ICD-10-CM | POA: Diagnosis not present

## 2014-12-26 ENCOUNTER — Ambulatory Visit (INDEPENDENT_AMBULATORY_CARE_PROVIDER_SITE_OTHER): Payer: Medicare HMO | Admitting: Internal Medicine

## 2014-12-26 ENCOUNTER — Encounter (INDEPENDENT_AMBULATORY_CARE_PROVIDER_SITE_OTHER): Payer: Self-pay | Admitting: Internal Medicine

## 2014-12-26 VITALS — BP 120/70 | HR 82 | Temp 98.4°F | Resp 18 | Ht 69.5 in | Wt 154.0 lb

## 2014-12-26 DIAGNOSIS — H3530 Unspecified macular degeneration: Secondary | ICD-10-CM | POA: Diagnosis not present

## 2014-12-26 DIAGNOSIS — K2100 Gastro-esophageal reflux disease with esophagitis, without bleeding: Secondary | ICD-10-CM

## 2014-12-26 DIAGNOSIS — H409 Unspecified glaucoma: Secondary | ICD-10-CM | POA: Diagnosis not present

## 2014-12-26 DIAGNOSIS — I10 Essential (primary) hypertension: Secondary | ICD-10-CM | POA: Diagnosis not present

## 2014-12-26 DIAGNOSIS — E782 Mixed hyperlipidemia: Secondary | ICD-10-CM | POA: Diagnosis not present

## 2014-12-26 DIAGNOSIS — R3915 Urgency of urination: Secondary | ICD-10-CM | POA: Diagnosis not present

## 2014-12-26 DIAGNOSIS — R319 Hematuria, unspecified: Secondary | ICD-10-CM | POA: Diagnosis not present

## 2014-12-26 NOTE — Progress Notes (Signed)
Presenting complaint;  Follow-up for GERD.  Subjective:  Patient is 69 year old Caucasian female who is here for scheduled visit. She was last seen 6 months ago. She has no complaints. She is watching her diet and takes OTC Nexium once a day. She states her cost is 0. She is not having any side effects. She denies dysphagia nausea vomiting or throat symptoms. She also denies abdominal pain melena or rectal bleeding. Bowels move regularly. She gives history of osteopenia but does not remember the timing of her last bone density study.   Current Medications: Outpatient Encounter Prescriptions as of 12/26/2014  Medication Sig  . aspirin 81 MG tablet Take 81 mg by mouth daily.    Marland Kitchen CALCIUM PO Take 1 tablet by mouth daily.  . Cholecalciferol (VITAMIN D PO) Take 1 tablet by mouth daily.  . diclofenac (VOLTAREN) 75 MG EC tablet Take 75 mg by mouth. Patient states that she takes 1 by mouth every other day  . dorzolamide-timolol (COSOPT) 22.3-6.8 MG/ML ophthalmic solution Place 1 drop into both eyes 2 (two) times daily.   Marland Kitchen esomeprazole (NEXIUM) 20 MG capsule Take 20 mg by mouth daily at 12 noon.  . folic acid (FOLVITE) 1 MG tablet Take 1 mg by mouth daily.  . Multiple Vitamins-Minerals (PRESERVISION AREDS 2) CAPS Take by mouth 2 (two) times daily.   . Omega-3 Fatty Acids (FISH OIL) 1000 MG CAPS Take by mouth daily.  . Psyllium (METAMUCIL PO) Take by mouth 2 (two) times daily.  . Red Yeast Rice Extract (RED YEAST RICE PO) Take by mouth 2 (two) times daily.  . [DISCONTINUED] omeprazole (PRILOSEC) 40 MG capsule Take 1 capsule (40 mg total) by mouth daily before breakfast. (Patient not taking: Reported on 12/26/2014)   No facility-administered encounter medications on file as of 12/26/2014.    Objective: Blood pressure 120/70, pulse 82, temperature 98.4 F (36.9 C), temperature source Oral, resp. rate 18, height 5' 9.5" (1.765 m), weight 154 lb (69.854 kg). Patient is alert and in no acute  distress. Conjunctiva is pink. Sclera is nonicteric Oropharyngeal mucosa is normal. No neck masses or thyromegaly noted. Cardiac exam with regular rhythm normal S1 and S2. No murmur or gallop noted. Lungs are clear to auscultation. Abdomen is symmetrical soft and nontender without organomegaly or masses. No LE edema or clubbing noted.    Assessment:  #1. Erosive reflux esophagitis. She underwent EGD in January 2015. Symptoms are well controlled with anti-reflex measures and low dose PPI. #2.  Patient is average risk for CRC. Last colonoscopy was in August 2013 and she would not need another screening until August 2023. #3. History of osteopenia.   Plan:  Continue Nexium OTC 20 mg by mouth every morning. Patient advised to check with Dr. Delphina Cahill if she is due for bone density study. Office visit in one year.

## 2014-12-26 NOTE — Patient Instructions (Signed)
Please check with Dr. [place] Crane but the timing of her next bone density study. Call if current dose of Nexium or esomeprazole stops working

## 2014-12-29 ENCOUNTER — Other Ambulatory Visit (HOSPITAL_COMMUNITY): Payer: Self-pay | Admitting: Internal Medicine

## 2014-12-29 DIAGNOSIS — M8580 Other specified disorders of bone density and structure, unspecified site: Secondary | ICD-10-CM

## 2015-01-04 ENCOUNTER — Ambulatory Visit (HOSPITAL_COMMUNITY)
Admission: RE | Admit: 2015-01-04 | Discharge: 2015-01-04 | Disposition: A | Discharge status: Home / Self Care | Payer: Commercial Managed Care - HMO | Source: Ambulatory Visit | Attending: Internal Medicine | Admitting: Internal Medicine

## 2015-01-04 DIAGNOSIS — M8580 Other specified disorders of bone density and structure, unspecified site: Secondary | ICD-10-CM

## 2015-01-04 DIAGNOSIS — M899 Disorder of bone, unspecified: Secondary | ICD-10-CM | POA: Diagnosis not present

## 2015-01-04 DIAGNOSIS — Z780 Asymptomatic menopausal state: Secondary | ICD-10-CM | POA: Diagnosis not present

## 2015-01-04 DIAGNOSIS — M8588 Other specified disorders of bone density and structure, other site: Secondary | ICD-10-CM | POA: Diagnosis not present

## 2015-01-19 DIAGNOSIS — R300 Dysuria: Secondary | ICD-10-CM | POA: Diagnosis not present

## 2015-02-19 ENCOUNTER — Other Ambulatory Visit (HOSPITAL_COMMUNITY): Payer: Self-pay | Admitting: Internal Medicine

## 2015-02-19 DIAGNOSIS — Z1231 Encounter for screening mammogram for malignant neoplasm of breast: Secondary | ICD-10-CM

## 2015-03-19 ENCOUNTER — Ambulatory Visit (HOSPITAL_COMMUNITY)
Admission: RE | Admit: 2015-03-19 | Discharge: 2015-03-19 | Disposition: A | Discharge status: Home / Self Care | Payer: Commercial Managed Care - HMO | Source: Ambulatory Visit | Attending: Internal Medicine | Admitting: Internal Medicine

## 2015-03-19 DIAGNOSIS — Z1231 Encounter for screening mammogram for malignant neoplasm of breast: Secondary | ICD-10-CM

## 2015-04-04 DIAGNOSIS — E039 Hypothyroidism, unspecified: Secondary | ICD-10-CM | POA: Diagnosis not present

## 2015-04-04 DIAGNOSIS — L600 Ingrowing nail: Secondary | ICD-10-CM | POA: Diagnosis not present

## 2015-04-04 DIAGNOSIS — B3789 Other sites of candidiasis: Secondary | ICD-10-CM | POA: Diagnosis not present

## 2015-04-04 DIAGNOSIS — R160 Hepatomegaly, not elsewhere classified: Secondary | ICD-10-CM | POA: Diagnosis not present

## 2015-04-04 DIAGNOSIS — D510 Vitamin B12 deficiency anemia due to intrinsic factor deficiency: Secondary | ICD-10-CM | POA: Diagnosis not present

## 2015-04-18 DIAGNOSIS — H02831 Dermatochalasis of right upper eyelid: Secondary | ICD-10-CM | POA: Diagnosis not present

## 2015-04-18 DIAGNOSIS — H02834 Dermatochalasis of left upper eyelid: Secondary | ICD-10-CM | POA: Diagnosis not present

## 2015-04-18 DIAGNOSIS — H04123 Dry eye syndrome of bilateral lacrimal glands: Secondary | ICD-10-CM | POA: Diagnosis not present

## 2015-04-18 DIAGNOSIS — H40053 Ocular hypertension, bilateral: Secondary | ICD-10-CM | POA: Diagnosis not present

## 2015-04-18 DIAGNOSIS — H353132 Nonexudative age-related macular degeneration, bilateral, intermediate dry stage: Secondary | ICD-10-CM | POA: Diagnosis not present

## 2015-04-18 DIAGNOSIS — H35373 Puckering of macula, bilateral: Secondary | ICD-10-CM | POA: Diagnosis not present

## 2015-05-01 DIAGNOSIS — M25572 Pain in left ankle and joints of left foot: Secondary | ICD-10-CM | POA: Diagnosis not present

## 2015-05-02 ENCOUNTER — Other Ambulatory Visit (HOSPITAL_COMMUNITY): Payer: Self-pay | Admitting: Internal Medicine

## 2015-05-02 ENCOUNTER — Ambulatory Visit (HOSPITAL_COMMUNITY)
Admission: RE | Admit: 2015-05-02 | Discharge: 2015-05-02 | Disposition: A | Discharge status: Home / Self Care | Payer: Commercial Managed Care - HMO | Source: Ambulatory Visit | Attending: Internal Medicine | Admitting: Internal Medicine

## 2015-05-02 DIAGNOSIS — S9002XA Contusion of left ankle, initial encounter: Secondary | ICD-10-CM | POA: Diagnosis not present

## 2015-05-02 DIAGNOSIS — S93402A Sprain of unspecified ligament of left ankle, initial encounter: Secondary | ICD-10-CM | POA: Insufficient documentation

## 2015-05-02 DIAGNOSIS — T148XXA Other injury of unspecified body region, initial encounter: Secondary | ICD-10-CM

## 2015-05-02 DIAGNOSIS — W010XXA Fall on same level from slipping, tripping and stumbling without subsequent striking against object, initial encounter: Secondary | ICD-10-CM | POA: Diagnosis not present

## 2015-05-07 DIAGNOSIS — S91152A Open bite of left great toe without damage to nail, initial encounter: Secondary | ICD-10-CM | POA: Diagnosis not present

## 2015-05-07 DIAGNOSIS — M25572 Pain in left ankle and joints of left foot: Secondary | ICD-10-CM | POA: Diagnosis not present

## 2015-05-07 DIAGNOSIS — S99912A Unspecified injury of left ankle, initial encounter: Secondary | ICD-10-CM | POA: Diagnosis not present

## 2015-05-30 DIAGNOSIS — M25572 Pain in left ankle and joints of left foot: Secondary | ICD-10-CM | POA: Diagnosis not present

## 2015-05-30 DIAGNOSIS — S99912D Unspecified injury of left ankle, subsequent encounter: Secondary | ICD-10-CM | POA: Diagnosis not present

## 2015-05-30 DIAGNOSIS — M25672 Stiffness of left ankle, not elsewhere classified: Secondary | ICD-10-CM | POA: Diagnosis not present

## 2015-05-30 DIAGNOSIS — S92152D Displaced avulsion fracture (chip fracture) of left talus, subsequent encounter for fracture with routine healing: Secondary | ICD-10-CM | POA: Diagnosis not present

## 2015-05-31 DIAGNOSIS — L603 Nail dystrophy: Secondary | ICD-10-CM | POA: Diagnosis not present

## 2015-05-31 DIAGNOSIS — M25519 Pain in unspecified shoulder: Secondary | ICD-10-CM | POA: Diagnosis not present

## 2015-05-31 DIAGNOSIS — Z9181 History of falling: Secondary | ICD-10-CM | POA: Diagnosis not present

## 2015-06-11 ENCOUNTER — Telehealth (HOSPITAL_COMMUNITY): Payer: Self-pay | Admitting: Physical Therapy

## 2015-06-11 ENCOUNTER — Ambulatory Visit (HOSPITAL_COMMUNITY): Payer: Commercial Managed Care - HMO | Attending: Sports Medicine | Admitting: Physical Therapy

## 2015-06-11 DIAGNOSIS — R600 Localized edema: Secondary | ICD-10-CM | POA: Diagnosis not present

## 2015-06-11 DIAGNOSIS — M25672 Stiffness of left ankle, not elsewhere classified: Secondary | ICD-10-CM | POA: Diagnosis not present

## 2015-06-11 DIAGNOSIS — M25572 Pain in left ankle and joints of left foot: Secondary | ICD-10-CM | POA: Insufficient documentation

## 2015-06-11 DIAGNOSIS — R2681 Unsteadiness on feet: Secondary | ICD-10-CM | POA: Diagnosis not present

## 2015-06-11 DIAGNOSIS — R262 Difficulty in walking, not elsewhere classified: Secondary | ICD-10-CM | POA: Diagnosis not present

## 2015-06-11 NOTE — Therapy (Signed)
[place], [place], [address] Phone: [phone]   Fax:  [phone]  Physical Therapy Evaluation  Patient Details  Name: [name] MRN: [id number] Date of Birth: Sep 06, 1945 Referring Provider: Verda Cumins MD   Encounter Date: 06/11/2015      PT End of Session - 06/11/15 1442    Visit Number 1   Number of Visits 12   Date for PT Re-Evaluation 07/09/15   Authorization Type [employer] Medicare HMO/[employer]    Authorization Time Period 06/11/15 to 08/09/15   Authorization - Visit Number 1   Authorization - Number of Visits 10   PT Start Time [id number]   PT Stop Time 1429   PT Time Calculation (min) 42 min   Activity Tolerance Patient tolerated treatment well;Patient limited by pain   Behavior During Therapy [facility] for tasks assessed/performed      Past Medical History  Diagnosis Date  . Pulmonary embolism   . Tachycardia   . Hemoptysis   . Iron deficiency   . GERD (gastroesophageal reflux disease)   . Diverticulitis   . COPD (chronic obstructive pulmonary disease)   . Hyperlipidemia   . Breast cancer     left breast/lumpectomy/chemo/rad 2003    Past Surgical History  Procedure Laterality Date  . Breast lumpectomy    . Back surgery    . Colonoscopy  01/28/2012    Procedure: COLONOSCOPY;  Surgeon: Rogene Houston, MD;  [facility];  Service: Endoscopy;  Laterality: N/A;  1200  . Colonoscopy  01-2012  . Tonsillectomy      Patient was age 70  . Laparoscopic tubal ligation  1970  . Esophagogastroduodenoscopy N/A 07/01/2013    Procedure: ESOPHAGOGASTRODUODENOSCOPY (EGD);  Surgeon: Rogene Houston, MD;  [facility];  Service: Endoscopy;  Laterality: N/A;  1:10    There were no vitals filed for this visit.  Visit Diagnosis:  Left ankle pain - Plan: PT plan of care cert/re-cert  Local edema - Plan: PT plan of care cert/re-cert  Ankle stiffness, left - Plan: PT plan of care  cert/re-cert  Difficulty walking - Plan: PT plan of care cert/re-cert  Unsteadiness - Plan: PT plan of care cert/re-cert      Subjective Assessment - 06/11/15 1354    Subjective Ankle itself does not have constant pain; on the outer and inner sides of the ankle she will have some sharp pains coming up leg. Does have constant pains in the front of her foot as well as down to the front of the toes.    Pertinent History Patient does have cardiac history, also history of PE as well; also history of back surgeries on neck and low back. As for her ankle, patient was walking in her yard, which had been re-sodded. She was trying to set up the sprinkler and fell. Her big toe dug into the clay dirt and came up with a big plug of grass. She looked on web MD and diagnosed herself with sprained ankle; did not walk on her ankle for 3 weeks and followed RICE. She then went to the MD who she reports did find a fracture (per imaging report, small avulsion). She reports she is still supposed to be in the boot but did not wear it due to the ice today as she reports she has had a fall 2 weeks ago due to the boot.     How long can you stand comfortably?  30 minutes    How long can you walk comfortably? have not tried distance walking; maybe 15-20 minutes    Patient Stated Goals get foot to not be swollen and start back towards PLOF    Currently in Pain? Yes   Pain Score 1    Pain Location Ankle   Pain Orientation Anterior            [facility] PT Assessment - 06/11/15 0001    Assessment   Medical Diagnosis L ankle pain/swelling    Referring Provider Verda Cumins MD    Onset Date/Surgical Date 03/30/15   Next MD Visit Dr. Drema [place] January 30th    Precautions   Precautions Other (comment)   Precaution Comments needs to be in boot until PT contacts MD    Restrictions   Weight Bearing Restrictions No   Balance Screen   Has the patient fallen in the past 6 months Yes   How many times? 4- one in yard and others  while wearing boot   Has the patient had a decrease in activity level because of a fear of falling?  No   Is the patient reluctant to leave their home because of a fear of falling?  No   Prior Function   Level of Independence Independent with basic ADLs;Independent;Independent with gait;Independent with transfers   Vocation Retired   Leisure no hobbies    Observation/Other Assessments   Observations 72cm figure 8 on R, 57 cm figure 8 on L. Generally a difference of 1.5-2cm volume with all other circumferential measures.    Focus on Therapeutic Outcomes (FOTO)  52% limited    AROM   Right Ankle Dorsiflexion 4   Right Ankle Plantar Flexion 60   Right Ankle Inversion 28   Right Ankle Eversion 14   Left Ankle Dorsiflexion 2  4 AAROM    Left Ankle Plantar Flexion 37   Left Ankle Inversion 7   Left Ankle Eversion 13   Strength   Right Ankle Dorsiflexion 5/5   Right Ankle Inversion 5/5   Right Ankle Eversion 5/5   Left Ankle Dorsiflexion 4-/5   Left Ankle Inversion 4/5  pain    Left Ankle Eversion 4+/5  painful                            PT Education - 06/11/15 1441    Education provided Yes   Education Details necessity of proper boot wear until MD clears her to be out of boot; prognosis, HEP, plan of care    Person(s) Educated Patient   Methods Explanation;Demonstration;Handout   Comprehension Verbalized understanding;Returned demonstration          PT Short Term Goals - 06/11/15 1450    PT SHORT TERM GOAL #1   Title Patient will be independent in self-care management techniques for edema and pain, including but not limited to elevation, retromassage, and appropriate stretches/exercises in order to enhance safe self management of condition    Time 3   Period Weeks   Status New   PT SHORT TERM GOAL #2   Title Patient will improve bilateral ankle dorsiflexion to at least 10 degrees in order to improve overall mobility and improve balance reaction skills     Time 3   Period Weeks   Status New   PT SHORT TERM GOAL #3   Title Patient to be able to perform inversion/eversion with L ankle to at least 15 degrees eversion  and at least 25 degrees inversion with pain 0/10 in order to improve mechanics and general ankle function    Time 3   Period Weeks   Status New   PT SHORT TERM GOAL #4   Title Patient to be independent in correctly and consistently performing appropriate HEP, to be updated PRN    Time 3   Period Weeks   Status New   PT SHORT TERM GOAL #5   Title Patient will demonstrate no more than 0.5cm difference with circumferential measures between both ankles in order to reduce edema and pain, improve ROM    Time 3   Period Weeks   Status New           PT Long Term Goals - 06/11/15 1454    PT LONG TERM GOAL #1   Title Patient will demonstrate strength at least 4+/5 in all ankle planes with pain 0/10 in order to improve overall mobility and promote overall ankle stability    Time 6   Period Weeks   Status New   PT LONG TERM GOAL #2   Title Patient will be able to ambulate unlimited distances outside of boot, after MD has cleared patient to do so, with pain no more than 1/10 in order to assist patient in returning to PLOF    Time 6   Period Weeks   Status New   PT LONG TERM GOAL #3   Title Patient will be able to ambulate over uneven surfaces with pain no more than 1/10 and no loss of balance in order to assist her in being able to work in her yard and ambulate outdoors    Time 6   Period Weeks   Status New   PT LONG TERM GOAL #4   Title Patient to score at least 52 on BERG balance test in order to demonstrate improved balance skills and in general overall reduced fall risk    Time 6   Period Weeks   Status New               Plan - 06/11/15 1443    Clinical Impression Statement Patient presents with left ankle pain and swelling secondary to fall last year that, per imaging reports found in EPIC, resulted in small  avulsion injury to talus. Patient arrived reporting that her MD has her still wearing a boot for weight bearing however she did not wear it today because of the ice; she was extensively educated regarding importance of wearing boot per MD advise and evaluation was adjusted today to non-weightbearing tasks only. Upon examination patient does display approximately 1.5-2.0cm additional circumferential volume compared to non-injured side, some ankle weakness, signficant ankle stiffness, and difficulty with gait. Patient educated in retrograde massage to ankle for now as well as ankle pumps, which she was able to perform on a pain free basis but all other exercises/activities held for now pending return call from MD regarding boot/activity in boot. Pain with inversion/eversion and ankle circles. At this time patient will benefit from skilled PT services to address her functional limitations and assist in reaching an optimal level of function. ADDENDUM- MD office returned phone call and report that MD states patient is OK to be out of boot at this time due to length of time since initial injury.     Pt will benefit from skilled therapeutic intervention in order to improve on the following deficits Abnormal gait;Hypomobility;Decreased knowledge of precautions;Increased edema;Decreased strength;Pain;Decreased mobility;Difficulty walking   Rehab Potential  Good   PT Frequency 2x / week   PT Duration 6 weeks   PT Treatment/Interventions ADLs/Self Care Home Management;Cryotherapy;Gait training;Stair training;Functional mobility training;Therapeutic activities;Therapeutic exercise;Balance training;Neuromuscular re-education;Patient/family education;Manual techniques   PT Next Visit Plan  perform  BERG and update HEP; review HEP and goals; functional exercise in pain free range, gait training, manual for edema and calf tightness   PT Home Exercise Plan given- expand as MD says OK to be out of boot    Consulted and Agree  with Plan of Care Patient          G-Codes - 06-24-15 1509    Functional Assessment Tool Used FOTO 52% limited    Functional Limitation Mobility: Walking and moving around   Mobility: Walking and Moving Around Current Status [phone]) At least 40 percent but less than 60 percent impaired, limited or restricted   Mobility: Walking and Moving Around Goal Status [phone]) At least 20 percent but less than 40 percent impaired, limited or restricted       Problem List Patient Active Problem List   Diagnosis Date Noted  . GERD (gastroesophageal reflux disease) 06/27/2013  . H/O: upper GI bleed 06/27/2013  . Epigastric pain 06/27/2013  . Tachycardia-bradycardia [facility]) 11/21/2010    Deniece Ree PT, DPT [phone]  [facility] [address] [place], [place], [address] Phone: [phone]   Fax:  [phone]  Name: VALINDA WEARS MRN: [id number] Date of Birth: 04-Nov-1945

## 2015-06-11 NOTE — Telephone Encounter (Signed)
Called MD office to ask about how long patient needs to be in boot; staff returned call after speaking to MD who stated that due to length of time since injury she can now come out of boot during [place], DPT [phone]

## 2015-06-11 NOTE — Patient Instructions (Signed)
RETROGRADE MASSAGE  With your foot elevated, and using gentle pressure, lightly rub up from your foot and up along your ankle to about halfway up your calf. Massage your ankle/foot like this on all sides for 5-10 minutes twice a day, or as needed.   Ankle Pumps    Lie with knees supported on bolster or sit. Straighten one knee. Keep knee straight; alternately press out and pull up heel. Emphasize movement of heel. Repeat __10-15_ times, 2-3 times per day.   Copyright  VHI. All rights reserved.

## 2015-06-14 ENCOUNTER — Encounter (HOSPITAL_COMMUNITY): Payer: Self-pay

## 2015-06-14 ENCOUNTER — Ambulatory Visit (HOSPITAL_COMMUNITY): Payer: Commercial Managed Care - HMO

## 2015-06-14 DIAGNOSIS — R262 Difficulty in walking, not elsewhere classified: Secondary | ICD-10-CM

## 2015-06-14 DIAGNOSIS — M25672 Stiffness of left ankle, not elsewhere classified: Secondary | ICD-10-CM | POA: Diagnosis not present

## 2015-06-14 DIAGNOSIS — R600 Localized edema: Secondary | ICD-10-CM | POA: Diagnosis not present

## 2015-06-14 DIAGNOSIS — R2681 Unsteadiness on feet: Secondary | ICD-10-CM | POA: Diagnosis not present

## 2015-06-14 DIAGNOSIS — M25572 Pain in left ankle and joints of left foot: Secondary | ICD-10-CM | POA: Diagnosis not present

## 2015-06-14 NOTE — Therapy (Signed)
[place], [place], [address] Phone: [phone]   Fax:  [phone]  Physical Therapy Treatment  Patient Details  Name: [name] MRN: [id number] Date of Birth: Oct 08, 1945 Referring Provider: Verda Cumins MD   Encounter Date: 06/14/2015      PT End of Session - 06/14/15 1639    Visit Number 2   Number of Visits 12   Date for PT Re-Evaluation 07/09/15   Authorization Type [employer] Medicare/[employer] Gold   Authorization Time Period 06/11/2015 to 08/09/2015   Authorization - Visit Number 2   Authorization - Number of Visits 10   PT Start Time 1518   PT Stop Time 1615   PT Time Calculation (min) 57 min   Equipment Utilized During Treatment Other (comment)  ankle brace provided by MD   Activity Tolerance Patient tolerated treatment well   Behavior During Therapy [facility] for tasks assessed/performed      Past Medical History  Diagnosis Date  . Pulmonary embolism ([place])   . Tachycardia   . Hemoptysis   . Iron deficiency   . GERD (gastroesophageal reflux disease)   . Diverticulitis   . COPD (chronic obstructive pulmonary disease) ([place])   . Hyperlipidemia   . Breast cancer [facility])     left breast/lumpectomy/chemo/rad 2003    Past Surgical History  Procedure Laterality Date  . Breast lumpectomy    . Back surgery    . Colonoscopy  01/28/2012    Procedure: COLONOSCOPY;  Surgeon: Rogene Houston, MD;  [facility];  Service: Endoscopy;  Laterality: N/A;  1200  . Colonoscopy  01-2012  . Tonsillectomy      Patient was age 70  . Laparoscopic tubal ligation  1970  . Esophagogastroduodenoscopy N/A 07/01/2013    Procedure: ESOPHAGOGASTRODUODENOSCOPY (EGD);  Surgeon: Rogene Houston, MD;  [facility];  Service: Endoscopy;  Laterality: N/A;  1:10    There were no vitals filed for this visit.  Visit Diagnosis:  Left ankle pain  Local edema  Ankle stiffness, left  Difficulty  walking  Unsteadiness      Subjective Assessment - 06/14/15 1527    Subjective Pt denied pain upon arrival, but noted 2/10 pain over the distal aspect of the L foot yesterday. Pt reports icing and massaging her L foot and ankle multiple times a day with good relief reported. Furthermore, pt noted good compliance with initial HEP with good tolerance reported. Pt denied falls since last PT visit.    Pertinent History Tachycardia   Limitations Walking;Standing   Currently in Pain? No/denies   Pain Score 2   Current pain= 0/10 on a VAS; Recent pain= 2/10 on a VAS   Pain Location Foot  distal aspect of the L foot over the metatarsal region   Pain Orientation Left   Multiple Pain Sites No                         [facility] Adult PT Treatment/Exercise - 06/14/15 0001    Static Standing Balance   Static Standing - Comment/# of Minutes on airex pad with feet shoulder width apart x 3 trials    Dynamic Standing Balance   Forward lean/weight shifting comments: anterior<>posterior and medial<>lateral weight shifting on airex pad x 3 trials each    Manual Therapy   Edema Management Retrograde massage to L ankle in supine position with L LE elevated   x 5 minutes  Myofascial Release L Gastroc-soleus in prone position   x 5 minutes    Passive ROM L ankle into DF, PF, inversion, eversion x 10 reps   in seated position   Ankle Exercises: Stretches   Gastroc Stretch 4 reps;30 seconds;Other (comment)  left; completed manually    Ankle Exercises: Seated   Ankle Circles/Pumps AROM;Left;15 reps   Towel Crunch Other (comment)  15 reps; left foot;    Heel Raises 10 reps;Other (comment)  left foot   Other Seated Ankle Exercises Rocker board in DF<>PF and inversion<>eversion x 15 reps each direction  L foot/ankle                PT Education - 06/14/15 1638    Education provided Yes   Education Details educated pt on PT eval findings and goals, use of ice, elevation, ankle  pumps, use of CAM boot, and updated HEP with addition of toe towel scrunches   Person(s) Educated Patient   Methods Explanation;Demonstration;Handout   Comprehension Verbalized understanding;Returned demonstration          PT Short Term Goals - 06/11/15 1450    PT SHORT TERM GOAL #1   Title Patient will be independent in self-care management techniques for edema and pain, including but not limited to elevation, retromassage, and appropriate stretches/exercises in order to enhance safe self management of condition    Time 3   Period Weeks   Status New   PT SHORT TERM GOAL #2   Title Patient will improve bilateral ankle dorsiflexion to at least 10 degrees in order to improve overall mobility and improve balance reaction skills    Time 3   Period Weeks   Status New   PT SHORT TERM GOAL #3   Title Patient to be able to perform inversion/eversion with L ankle to at least 15 degrees eversion and at least 25 degrees inversion with pain 0/10 in order to improve mechanics and general ankle function    Time 3   Period Weeks   Status New   PT SHORT TERM GOAL #4   Title Patient to be independent in correctly and consistently performing appropriate HEP, to be updated PRN    Time 3   Period Weeks   Status New   PT SHORT TERM GOAL #5   Title Patient will demonstrate no more than 0.5cm difference with circumferential measures between both ankles in order to reduce edema and pain, improve ROM    Time 3   Period Weeks   Status New           PT Long Term Goals - 06/11/15 1454    PT LONG TERM GOAL #1   Title Patient will demonstrate strength at least 4+/5 in all ankle planes with pain 0/10 in order to improve overall mobility and promote overall ankle stability    Time 6   Period Weeks   Status New   PT LONG TERM GOAL #2   Title Patient will be able to ambulate unlimited distances outside of boot, after MD has cleared patient to do so, with pain no more than 1/10 in order to assist  patient in returning to PLOF    Time 6   Period Weeks   Status New   PT LONG TERM GOAL #3   Title Patient will be able to ambulate over uneven surfaces with pain no more than 1/10 and no loss of balance in order to assist her in being able to work in her yard  and ambulate outdoors    Time 6   Period Weeks   Status New   PT LONG TERM GOAL #4   Title Patient to score at least 52 on BERG balance test in order to demonstrate improved balance skills and in general overall reduced fall risk    Time 6   Period Weeks   Status New               Plan - 06/14/15 1642    Clinical Impression Statement Pt tolerated additional L ankle/foot ROM ther ex and added manual therapy techniques without exacerbating symptoms. Palpable edema and tenderness assessed over the distal and dorsal aspect of the L foot. L foot edema was reduced post retrograde massage. Instructed pt to ice and elevate to manage edema and pain. Added toe towel scrunches to ther ex regimen and HEP with good tolerance reported.. Progressed to standing weight shifting on airex pad with ankle brace donned with fair weight shifting assessed. Pt remained pain-free through today's tx. Pt declined cryotherapy at the completion of today's PT visit. Pt would continue to benefit from skilled PT to address current limitations and impairments.    Pt will benefit from skilled therapeutic intervention in order to improve on the following deficits Abnormal gait;Decreased range of motion;Difficulty walking;Increased muscle spasms;Decreased activity tolerance;Hypomobility;Pain;Impaired flexibility;Decreased mobility;Decreased strength;Increased edema   Rehab Potential Good   PT Frequency 2x / week   PT Duration 6 weeks   PT Next Visit Plan Next visit to focus on L ankle PROM/AROM ther ex, weight shifting on airex pad, STM/MFR to L gastroc-soleus, and review HEP understanding/tolerance.    PT Home Exercise Plan Added toe towel scrunches this visit; add  ankle isometrics 4-way at next visit   Consulted and Agree with Plan of Care Patient        Problem List Patient Active Problem List   Diagnosis Date Noted  . GERD (gastroesophageal reflux disease) 06/27/2013  . H/O: upper GI bleed 06/27/2013  . Epigastric pain 06/27/2013  . 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[place]) 11/21/2010    Garen Lah, PT, DPT    06/14/2015, 5:08 PM  [facility] [address] [place], [place], [address] Phone: [phone]   Fax:  [phone]  Name: [name] MRN: [id number] Date of Birth: [birth year]

## 2015-06-14 NOTE — Patient Instructions (Signed)
   Towel scrunch  Place towel on hard surface.  Begin with the foot flat on the floor.  Keeping the heel on the floor, repetitively "scrunch up" the towel by curling your toes.  Each repetition of toe curling is one repetition.  Attempt to bunch up the towel under the foot.  Completed x 15 reps, 2-3 sets/day, everyday

## 2015-06-15 DIAGNOSIS — E782 Mixed hyperlipidemia: Secondary | ICD-10-CM | POA: Diagnosis not present

## 2015-06-15 DIAGNOSIS — I10 Essential (primary) hypertension: Secondary | ICD-10-CM | POA: Diagnosis not present

## 2015-06-15 DIAGNOSIS — R7301 Impaired fasting glucose: Secondary | ICD-10-CM | POA: Diagnosis not present

## 2015-06-19 ENCOUNTER — Ambulatory Visit (HOSPITAL_COMMUNITY): Payer: Commercial Managed Care - HMO

## 2015-06-19 DIAGNOSIS — R262 Difficulty in walking, not elsewhere classified: Secondary | ICD-10-CM

## 2015-06-19 DIAGNOSIS — M25672 Stiffness of left ankle, not elsewhere classified: Secondary | ICD-10-CM | POA: Diagnosis not present

## 2015-06-19 DIAGNOSIS — R2681 Unsteadiness on feet: Secondary | ICD-10-CM

## 2015-06-19 DIAGNOSIS — M25572 Pain in left ankle and joints of left foot: Secondary | ICD-10-CM

## 2015-06-19 DIAGNOSIS — R600 Localized edema: Secondary | ICD-10-CM

## 2015-06-19 NOTE — Patient Instructions (Signed)
Ankle Alphabet    Using left ankle and foot only, trace the letters of the alphabet. Perform A to Z. Repeat _1___ times per set. Do _1___ sets per session. Do __3__ sessions per day.  http://orth.exer.us/17   Copyright  VHI. All rights reserved.

## 2015-06-19 NOTE — Therapy (Addendum)
[place], [place], [address] Phone: [phone]   Fax:  [phone]  Physical Therapy Treatment  Patient Details  Name: [name] MRN: [id number] Date of Birth: Mar 24, 1946 Referring Provider: Verda Cumins MD   Encounter Date: 06/19/2015      PT End of Session - 06/19/15 1456    Visit Number 3   Number of Visits 12   Date for PT Re-Evaluation 07/09/15   Authorization - Visit Number 3   Authorization - Number of Visits 10   PT Start Time 0230   PT Stop Time 0320   PT Time Calculation (min) 50 min   Activity Tolerance Patient tolerated treatment well   Behavior During Therapy [facility] for tasks assessed/performed      Past Medical History  Diagnosis Date  . Pulmonary embolism ([place])   . Tachycardia   . Hemoptysis   . Iron deficiency   . GERD (gastroesophageal reflux disease)   . Diverticulitis   . COPD (chronic obstructive pulmonary disease) ([place])   . Hyperlipidemia   . Breast cancer [facility])     left breast/lumpectomy/chemo/rad 2003    Past Surgical History  Procedure Laterality Date  . Breast lumpectomy    . Back surgery    . Colonoscopy  01/28/2012    Procedure: COLONOSCOPY;  Surgeon: Rogene Houston, MD;  Location: [facility];  Service: Endoscopy;  Laterality: N/A;  1200  . Colonoscopy  01-2012  . Tonsillectomy      Patient was age 70  . Laparoscopic tubal ligation  1970  . Esophagogastroduodenoscopy N/A 07/01/2013    Procedure: ESOPHAGOGASTRODUODENOSCOPY (EGD);  Surgeon: Rogene Houston, MD;  Location: [facility];  Service: Endoscopy;  Laterality: N/A;  1:10    There were no vitals filed for this visit.  Visit Diagnosis:  Left ankle pain  Local edema  Ankle stiffness, left  Difficulty walking  Unsteadiness      Subjective Assessment - 06/19/15 1440    Subjective Pt denies "pain". "Getting better".     Pertinent History Tachycardia   Limitations Walking;Standing   Currently  in Pain? No/denies   Pain Score 2   with ther ex/ activity   Pain Location Ankle   Pain Orientation Left                         [facility] Adult PT Treatment/Exercise - 06/19/15 1435    Dynamic Standing Balance   Forward lean/weight shifting comments: Airex pad: A<>P, R<>L, 20 reps each    Manual Therapy   Edema Management Retrograde massage to L ankle in supine position   x 5 minutes   Myofascial Release L Gastroc-soleus in prone position   x 5 minutes    Passive ROM L MTP, DIP, PIP flexion ext. ankle PF/DF, INV/ EV.   in supine position   Ankle Exercises: Seated   ABC's 1 rep   Ankle Circles/Pumps 20 reps   Towel Crunch Other (comment)  2 mins   Towel Inversion/Eversion Other (comment)  towel swipes   BAPS Sitting;Level 1;10 reps   Ankle Exercises: Stretches   Soleus Stretch 3 reps;30 seconds  slant board, sitting   Gastroc Stretch 3 reps;30 seconds  slant board standing   Ankle Exercises: Standing   SLS L: 4 secs, 8 secs, R: 12 secs, 26 secs.                 PT Education -  06/19/15 1444    Education provided Yes   Education Details HEP: Added ABCs with written handout given.    Person(s) Educated Patient   Methods Explanation;Demonstration;Handout   Comprehension Verbalized understanding;Returned demonstration          PT Short Term Goals - 06/11/15 1450    PT SHORT TERM GOAL #1   Title Patient will be independent in self-care management techniques for edema and pain, including but not limited to elevation, retromassage, and appropriate stretches/exercises in order to enhance safe self management of condition    Time 3   Period Weeks   Status New   PT SHORT TERM GOAL #2   Title Patient will improve bilateral ankle dorsiflexion to at least 10 degrees in order to improve overall mobility and improve balance reaction skills    Time 3   Period Weeks   Status New   PT SHORT TERM GOAL #3   Title Patient to be able to perform  inversion/eversion with L ankle to at least 15 degrees eversion and at least 25 degrees inversion with pain 0/10 in order to improve mechanics and general ankle function    Time 3   Period Weeks   Status New   PT SHORT TERM GOAL #4   Title Patient to be independent in correctly and consistently performing appropriate HEP, to be updated PRN    Time 3   Period Weeks   Status New   PT SHORT TERM GOAL #5   Title Patient will demonstrate no more than 0.5cm difference with circumferential measures between both ankles in order to reduce edema and pain, improve ROM    Time 3   Period Weeks   Status New           PT Long Term Goals - 06/11/15 1454    PT LONG TERM GOAL #1   Title Patient will demonstrate strength at least 4+/5 in all ankle planes with pain 0/10 in order to improve overall mobility and promote overall ankle stability    Time 6   Period Weeks   Status New   PT LONG TERM GOAL #2   Title Patient will be able to ambulate unlimited distances outside of boot, after MD has cleared patient to do so, with pain no more than 1/10 in order to assist patient in returning to PLOF    Time 6   Period Weeks   Status New   PT LONG TERM GOAL #3   Title Patient will be able to ambulate over uneven surfaces with pain no more than 1/10 and no loss of balance in order to assist her in being able to work in her yard and ambulate outdoors    Time 6   Period Weeks   Status New   PT LONG TERM GOAL #4   Title Patient to score at least 52 on BERG balance test in order to demonstrate improved balance skills and in general overall reduced fall risk    Time 6   Period Weeks   Status New               Plan - 06/20/15 0745    Clinical Impression Statement Pt tolerated all tx today without exacerbation of symptoms. Added ABC for HEP, and pt demonstrated understanding. Progressed SLS and dynamic balance on airex pad. VCs for counterbalance with upper body during weight shift anterior and  posterior. Pt continues to make good progress toward goals and would benefit from continued PT services.  Pt will benefit from skilled therapeutic intervention in order to improve on the following deficits Abnormal gait;Decreased range of motion;Difficulty walking;Increased muscle spasms;Decreased activity tolerance;Hypomobility;Pain;Impaired flexibility;Decreased mobility;Decreased strength;Increased edema   Rehab Potential Good   PT Frequency 2x / week   PT Duration 6 weeks   PT Treatment/Interventions ADLs/Self Care Home Management;Cryotherapy;Gait training;Stair training;Functional mobility training;Therapeutic activities;Therapeutic exercise;Balance training;Neuromuscular re-education;Patient/family education;Manual techniques   PT Next Visit Plan Next visit add ankle 4 way with theraband,  weight shifting on airex pad, SLS, STM/MFR to L gastroc-soleus, and review HEP understanding/tolerance.    PT Home Exercise Plan Added ABCs.    Consulted and Agree with Plan of Care Patient        Problem List Patient Active Problem List   Diagnosis Date Noted  . GERD (gastroesophageal reflux disease) 06/27/2013  . H/O: upper GI bleed 06/27/2013  . Epigastric pain 06/27/2013  . 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 [place], [place], [address] Phone: [phone]   Fax:  [phone]  Name: [name] MRN: [id number] Date of Birth: 1945/06/21

## 2015-06-20 DIAGNOSIS — R7301 Impaired fasting glucose: Secondary | ICD-10-CM | POA: Diagnosis not present

## 2015-06-20 DIAGNOSIS — E782 Mixed hyperlipidemia: Secondary | ICD-10-CM | POA: Diagnosis not present

## 2015-06-20 DIAGNOSIS — E875 Hyperkalemia: Secondary | ICD-10-CM | POA: Diagnosis not present

## 2015-06-21 ENCOUNTER — Ambulatory Visit (HOSPITAL_COMMUNITY): Payer: Commercial Managed Care - HMO

## 2015-06-21 DIAGNOSIS — M25572 Pain in left ankle and joints of left foot: Secondary | ICD-10-CM

## 2015-06-21 DIAGNOSIS — R2681 Unsteadiness on feet: Secondary | ICD-10-CM

## 2015-06-21 DIAGNOSIS — R262 Difficulty in walking, not elsewhere classified: Secondary | ICD-10-CM

## 2015-06-21 DIAGNOSIS — R600 Localized edema: Secondary | ICD-10-CM | POA: Diagnosis not present

## 2015-06-21 DIAGNOSIS — M25672 Stiffness of left ankle, not elsewhere classified: Secondary | ICD-10-CM | POA: Diagnosis not present

## 2015-06-21 NOTE — Therapy (Signed)
[place], [place], [address] Phone: [phone]   Fax:  [phone]  Physical Therapy Treatment  Patient Details  Name: [name] MRN: [id number] Date of Birth: 16-Apr-1946 Referring Provider: Verda Cumins MD   Encounter Date: 06/21/2015      PT End of Session - 06/21/15 1452    Visit Number 4   Number of Visits 12   Date for PT Re-Evaluation 07/09/15   Authorization Type [employer]   Authorization Time Period 06/11/2015 to 08/09/2015   Authorization - Visit Number 4   Authorization - Number of Visits 10   PT Start Time [id number]   PT Stop Time 1518   PT Time Calculation (min) 46 min   Activity Tolerance Patient tolerated treatment well   Behavior During Therapy [facility] for tasks assessed/performed      Past Medical History  Diagnosis Date  . Pulmonary embolism ([place])   . Tachycardia   . Hemoptysis   . Iron deficiency   . GERD (gastroesophageal reflux disease)   . Diverticulitis   . COPD (chronic obstructive pulmonary disease) ([place])   . Hyperlipidemia   . Breast cancer [facility])     left breast/lumpectomy/chemo/rad 2003    Past Surgical History  Procedure Laterality Date  . Breast lumpectomy    . Back surgery    . Colonoscopy  01/28/2012    Procedure: COLONOSCOPY;  Surgeon: Rogene Houston, MD;  [facility];  Service: Endoscopy;  Laterality: N/A;  1200  . Colonoscopy  01-2012  . Tonsillectomy      Patient was age 70  . Laparoscopic tubal ligation  1970  . Esophagogastroduodenoscopy N/A 07/01/2013    Procedure: ESOPHAGOGASTRODUODENOSCOPY (EGD);  Surgeon: Rogene Houston, MD;  [facility];  Service: Endoscopy;  Laterality: N/A;  1:10    There were no vitals filed for this visit.  Visit Diagnosis:  Left ankle pain  Local edema  Ankle stiffness, left  Difficulty walking  Unsteadiness      Subjective Assessment - 06/21/15 1451    Subjective Pt stated she  was a little sore yesterda,, pain free today.  Pt reports compliance with HEP with no questions concerning   Pertinent History Tachycardia   Patient Stated Goals get foot to not be swollen and start back towards PLOF    Currently in Pain? No/denies            [facility] PT Assessment - 06/21/15 0001    Standardized Balance Assessment   Standardized Balance Assessment Berg Balance Test   Berg Balance Test   Sit to Stand Able to stand without using hands and stabilize independently   Standing Unsupported Able to stand safely 2 minutes   Sitting with Back Unsupported but Feet Supported on Floor or Stool Able to sit safely and securely 2 minutes   Stand to Sit Sits safely with minimal use of hands   Transfers Able to transfer safely, minor use of hands   Standing Unsupported with Eyes Closed Able to stand 10 seconds safely   Standing Ubsupported with Feet Together Able to place feet together independently and stand 1 minute safely   From Standing, Reach Forward with Outstretched Arm Can reach confidently >25 cm (10")   From Standing Position, Pick up Object from Floor Able to pick up shoe safely and easily   From Standing Position, Turn to Look Behind Over each Shoulder Looks behind one side only/other side shows less  weight shift   Turn 360 Degrees Able to turn 360 degrees safely one side only in 4 seconds or less   Standing Unsupported, Alternately Place Feet on Step/Stool Able to stand independently and complete 8 steps >20 seconds   Standing Unsupported, One Foot in Front Able to plae foot ahead of the other independently and hold 30 seconds   Standing on One Leg Able to lift leg independently and hold > 10 seconds   Total Score 52             [facility] Adult PT Treatment/Exercise - 06/21/15 0001    Exercises   Exercises Ankle   Ankle Exercises: Stretches   Soleus Stretch 3 reps;30 seconds  slant board seated   Slant Board Stretch 3 reps;30 seconds   Ankle Exercises: Standing   SLS  Lt 23", Rt 23" max of 3   Rocker Board 2 minutes   Rocker Board Limitations In/Ev and A/P   Other Standing Ankle Exercises Airex weight shifting   Ankle Exercises: Seated   ABC's Limitations   ABC's Limitations HEP   BAPS Level 2;10 reps;Sitting   Ankle Exercises: Supine   T-Band RTB all directions            PT Short Term Goals - 06/11/15 1450    PT SHORT TERM GOAL #1   Title Patient will be independent in self-care management techniques for edema and pain, including but not limited to elevation, retromassage, and appropriate stretches/exercises in order to enhance safe self management of condition    Time 3   Period Weeks   Status New   PT SHORT TERM GOAL #2   Title Patient will improve bilateral ankle dorsiflexion to at least 10 degrees in order to improve overall mobility and improve balance reaction skills    Time 3   Period Weeks   Status New   PT SHORT TERM GOAL #3   Title Patient to be able to perform inversion/eversion with L ankle to at least 15 degrees eversion and at least 25 degrees inversion with pain 0/10 in order to improve mechanics and general ankle function    Time 3   Period Weeks   Status New   PT SHORT TERM GOAL #4   Title Patient to be independent in correctly and consistently performing appropriate HEP, to be updated PRN    Time 3   Period Weeks   Status New   PT SHORT TERM GOAL #5   Title Patient will demonstrate no more than 0.5cm difference with circumferential measures between both ankles in order to reduce edema and pain, improve ROM    Time 3   Period Weeks   Status New           PT Long Term Goals - 06/11/15 1454    PT LONG TERM GOAL #1   Title Patient will demonstrate strength at least 4+/5 in all ankle planes with pain 0/10 in order to improve overall mobility and promote overall ankle stability    Time 6   Period Weeks   Status New   PT LONG TERM GOAL #2   Title Patient will be able to ambulate unlimited distances outside of  boot, after MD has cleared patient to do so, with pain no more than 1/10 in order to assist patient in returning to PLOF    Time 6   Period Weeks   Status New   PT LONG TERM GOAL #3   Title Patient will be able  to ambulate over uneven surfaces with pain no more than 1/10 and no loss of balance in order to assist her in being able to work in her yard and ambulate outdoors    Time 6   Period Weeks   Status New   PT LONG TERM GOAL #4   Title Patient to score at least 52 on BERG balance test in order to demonstrate improved balance skills and in general overall reduced fall risk    Time 6   Period Weeks   Status New               Plan - 06/21/15 1748    Clinical Impression Statement Reviwed new HEP exercises with reoprts of compliance and ability to verbalize technique.  Progressed ankle strengthening this session with weight bearing activities including weight shifting on dynamic surface, SLS activities and rockerboard to improve weight distribution with gait.  Pt improving though does continue to have deficits with SLS.  BERG Balance test complete with score 52/56.  Began 4 way theraband with RTB resistance for strengthening.  No reports of pain through sessin   PT Next Visit Plan Continue with PT POC for ankle strengthening with manual PRN.  Begin standing heel and toe raises next session as tolerated.        Problem List Patient Active Problem List   Diagnosis Date Noted  . GERD (gastroesophageal reflux disease) 06/27/2013  . H/O: upper GI bleed 06/27/2013  . Epigastric pain 06/27/2013  . 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 [place], [place], [address] Phone: [phone]   Fax:  [phone]  Name: NYKEA JACEK MRN: [id number] Date of Birth: 1945-07-13

## 2015-06-26 ENCOUNTER — Ambulatory Visit (HOSPITAL_COMMUNITY): Payer: Commercial Managed Care - HMO

## 2015-06-26 ENCOUNTER — Encounter (HOSPITAL_COMMUNITY): Payer: Self-pay

## 2015-06-26 DIAGNOSIS — L603 Nail dystrophy: Secondary | ICD-10-CM | POA: Diagnosis not present

## 2015-06-26 DIAGNOSIS — R262 Difficulty in walking, not elsewhere classified: Secondary | ICD-10-CM | POA: Diagnosis not present

## 2015-06-26 DIAGNOSIS — M25572 Pain in left ankle and joints of left foot: Secondary | ICD-10-CM

## 2015-06-26 DIAGNOSIS — M25672 Stiffness of left ankle, not elsewhere classified: Secondary | ICD-10-CM

## 2015-06-26 DIAGNOSIS — B351 Tinea unguium: Secondary | ICD-10-CM | POA: Diagnosis not present

## 2015-06-26 DIAGNOSIS — R600 Localized edema: Secondary | ICD-10-CM | POA: Diagnosis not present

## 2015-06-26 DIAGNOSIS — R2681 Unsteadiness on feet: Secondary | ICD-10-CM | POA: Diagnosis not present

## 2015-06-26 NOTE — Therapy (Signed)
[place], [place], [address] Phone: [phone]   Fax:  [phone]  Physical Therapy Treatment  Patient Details  Name: [name] MRN: [id number] Date of Birth: [birth year] Referring Provider: Verda Cumins MD   Encounter Date: 06/26/2015      PT End of Session - 06/26/15 1026    Visit Number 5   Number of Visits 12   Date for PT Re-Evaluation 07/09/15   Authorization Type [employer]   Authorization Time Period 06/11/2015 to 08/09/2015   Authorization - Visit Number 5   Authorization - Number of Visits 10   PT Start Time 0931   PT Stop Time 1031   PT Time Calculation (min) 60 min   Activity Tolerance Patient tolerated treatment well   Behavior During Therapy [facility] for tasks assessed/performed      Past Medical History  Diagnosis Date  . Pulmonary embolism ([place])   . Tachycardia   . Hemoptysis   . Iron deficiency   . GERD (gastroesophageal reflux disease)   . Diverticulitis   . COPD (chronic obstructive pulmonary disease) ([place])   . Hyperlipidemia   . Breast cancer [facility])     left breast/lumpectomy/chemo/rad 2003    Past Surgical History  Procedure Laterality Date  . Breast lumpectomy    . Back surgery    . Colonoscopy  01/28/2012    Procedure: COLONOSCOPY;  Surgeon: Rogene Houston, MD;  [facility];  Service: Endoscopy;  Laterality: N/A;  1200  . Colonoscopy  01-2012  . Tonsillectomy      Patient was age 70  . Laparoscopic tubal ligation  1970  . Esophagogastroduodenoscopy N/A 07/01/2013    Procedure: ESOPHAGOGASTRODUODENOSCOPY (EGD);  Surgeon: Rogene Houston, MD;  Location[facility];  Service: Endoscopy;  Laterality: N/A;  1:10    There were no vitals filed for this visit.  Visit Diagnosis:  Left ankle pain  Local edema  Ankle stiffness, left  Difficulty walking  Unsteadiness      Subjective Assessment - 06/26/15 0935    Subjective Pt denied pain  upon arrival and stated " I feel like I'm getting better". Pt denied pain since last PT visit, but noted that she continues to experience intermittent swelling of her L foot and ankle.    Pertinent History Tachycardia   Limitations Walking;Standing   How long can you stand comfortably? 30 minutes    How long can you walk comfortably? 15-20 minutes    Patient Stated Goals Pt's goals include decreasing her L foot/ankle edema, improve her walking distance, and improve her balance.    Currently in Pain? No/denies   Pain Score 0-No pain   Pain Location Ankle   Pain Orientation Left   Multiple Pain Sites No                         [facility] Adult PT Treatment/Exercise - 06/26/15 0001    Knee/Hip Exercises: Standing   Lateral Step Up Left;2 sets;10 reps;Step Height: 6";Other (comment)  with B UE support on railing   Forward Step Up 2 sets;10 reps;Step Height: 6";Left;Other (comment)  with B UE support on railing   Wall Squat 2 sets;10 reps;Other (comment)  with physioball in corner of room    Manual Therapy   Edema Management Retrograde massage to L ankle in supine position    Myofascial Release L Gastroc-soleus in prone position    Passive  ROM L MTP, DIP, PIP flexion ext. ankle PF/DF, INV/ EV.    Ankle Exercises: Stretches   Soleus Stretch 3 reps;30 seconds   Slant Board Stretch 3 reps;30 seconds   Ankle Exercises: Standing   SLS L LE x 4 sets x 25 seconds with unilateral UE support   Rocker Board 2 minutes   Rocker Board Limitations In/Ev and A/P  with unilateral UE support   Ankle Exercises: Supine   Other Supine Ankle Exercises L ankle inversion and DF with manual resist 2 sets x 10 reps, each                 PT Education - 06/26/15 1056    Education provided Yes   Education Details Educated pt on current HEP, cryotherapy coupled with elevation to reduce edema, and to wear closed heel shoes.    Person(s) Educated Patient   Methods Explanation;Demonstration    Comprehension Verbalized understanding;Returned demonstration          PT Short Term Goals - 06/11/15 1450    PT SHORT TERM GOAL #1   Title Patient will be independent in self-care management techniques for edema and pain, including but not limited to elevation, retromassage, and appropriate stretches/exercises in order to enhance safe self management of condition    Time 3   Period Weeks   Status New   PT SHORT TERM GOAL #2   Title Patient will improve bilateral ankle dorsiflexion to at least 10 degrees in order to improve overall mobility and improve balance reaction skills    Time 3   Period Weeks   Status New   PT SHORT TERM GOAL #3   Title Patient to be able to perform inversion/eversion with L ankle to at least 15 degrees eversion and at least 25 degrees inversion with pain 0/10 in order to improve mechanics and general ankle function    Time 3   Period Weeks   Status New   PT SHORT TERM GOAL #4   Title Patient to be independent in correctly and consistently performing appropriate HEP, to be updated PRN    Time 3   Period Weeks   Status New   PT SHORT TERM GOAL #5   Title Patient will demonstrate no more than 0.5cm difference with circumferential measures between both ankles in order to reduce edema and pain, improve ROM    Time 3   Period Weeks   Status New           PT Long Term Goals - 06/11/15 1454    PT LONG TERM GOAL #1   Title Patient will demonstrate strength at least 4+/5 in all ankle planes with pain 0/10 in order to improve overall mobility and promote overall ankle stability    Time 6   Period Weeks   Status New   PT LONG TERM GOAL #2   Title Patient will be able to ambulate unlimited distances outside of boot, after MD has cleared patient to do so, with pain no more than 1/10 in order to assist patient in returning to PLOF    Time 6   Period Weeks   Status New   PT LONG TERM GOAL #3   Title Patient will be able to ambulate over uneven surfaces  with pain no more than 1/10 and no loss of balance in order to assist her in being able to work in her yard and ambulate outdoors    Time 6   Period Weeks  Status New   PT LONG TERM GOAL #4   Title Patient to score at least 52 on BERG balance test in order to demonstrate improved balance skills and in general overall reduced fall risk    Time 6   Period Weeks   Status New               Plan - 06/26/15 1033    Clinical Impression Statement Reviewed current HEP with good understanding demo and verbalized per pt. Less prominent L ankle/foot edema assessed this visit. Progressed ankle stabilization ther act with addition of anterior and lateral step ups on 6" step with B UE support. Pt was able to demo good control of the L LE with no pain exacerbated. Minor cues required for proper L knee alignment during step ups. Completed L SLS with toes of R ankle grounded in order to improve balance. Pt demo decreased ankle stability with SLS activity with verbal cues required to pull the arch of the L foot up in order to improve muscle recruitment and alignment. L ankle weakness was assessed into inversion, which was graded a 4-/5 MMT. L ankle pain remained at baseline levels at completion of PT tx. Pt is making steady progress towards stated goals with less severe ankle/foot pain, less edema, and improved activity tolerance. Pt would benefit from continued skilled PT in order to further improve L ankle stabilization/strength, ROM, edema, and jt mobility.    Pt will benefit from skilled therapeutic intervention in order to improve on the following deficits Abnormal gait;Decreased range of motion;Difficulty walking;Increased muscle spasms;Decreased activity tolerance;Hypomobility;Pain;Impaired flexibility;Decreased mobility;Decreased strength;Increased edema   Rehab Potential Good   PT Frequency 2x / week   PT Duration 6 weeks   PT Treatment/Interventions ADLs/Self Care Home Management;Cryotherapy;Gait  training;Stair training;Functional mobility training;Therapeutic activities;Therapeutic exercise;Balance training;Neuromuscular re-education;Patient/family education;Manual techniques   PT Next Visit Plan Continue with current PT POC with focus on CKC/OKC ankle strengthening, manual therapy techniques to improve joint mobility and reduce edema, and update HEP . Begin standing heel and toe raises next session as tolerated. Reassess L ankle AROM.    PT Home Exercise Plan Reviewed current HEP with plan to add step-ups to HEP at next PT visit.    Consulted and Agree with Plan of Care Patient        Problem List Patient Active Problem List   Diagnosis Date Noted  . GERD (gastroesophageal reflux disease) 06/27/2013  . H/O: upper GI bleed 06/27/2013  . Epigastric pain 06/27/2013  . 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[place]) 11/21/2010    Garen Lah, PT, DPT    06/26/2015, 10:58 AM  [place] [address] [place], [place], [address] Phone: [phone]   Fax:  [phone]  Name: [name] MRN: [id number] Date of Birth: 1945-10-27

## 2015-06-28 ENCOUNTER — Ambulatory Visit (HOSPITAL_COMMUNITY): Payer: Commercial Managed Care - HMO

## 2015-06-28 ENCOUNTER — Encounter (HOSPITAL_COMMUNITY): Payer: Self-pay

## 2015-06-28 DIAGNOSIS — R262 Difficulty in walking, not elsewhere classified: Secondary | ICD-10-CM

## 2015-06-28 DIAGNOSIS — M25672 Stiffness of left ankle, not elsewhere classified: Secondary | ICD-10-CM

## 2015-06-28 DIAGNOSIS — R2681 Unsteadiness on feet: Secondary | ICD-10-CM

## 2015-06-28 DIAGNOSIS — R600 Localized edema: Secondary | ICD-10-CM | POA: Diagnosis not present

## 2015-06-28 DIAGNOSIS — M25572 Pain in left ankle and joints of left foot: Secondary | ICD-10-CM | POA: Diagnosis not present

## 2015-06-28 NOTE — Therapy (Signed)
[place], [place], [address] Phone: [phone]   Fax:  [phone]  Physical Therapy Treatment  Patient Details  Name: [name] MRN: [id number] Date of Birth: Feb 29, 1946 Referring Provider: Verda Cumins MD   Encounter Date: 06/28/2015      PT End of Session - 06/28/15 1436    Visit Number 6   Number of Visits 12   Date for PT Re-Evaluation 07/09/15   Authorization Type [employer]   Authorization Time Period 06/11/2015 to 08/09/2015   Authorization - Visit Number 6   Authorization - Number of Visits 10   PT Start Time [id number]   PT Stop Time 1515   PT Time Calculation (min) 44 min   Activity Tolerance Patient tolerated treatment well;No increased pain   Behavior During Therapy [facility] for tasks assessed/performed      Past Medical History  Diagnosis Date  . Pulmonary embolism ([place])   . Tachycardia   . Hemoptysis   . Iron deficiency   . GERD (gastroesophageal reflux disease)   . Diverticulitis   . COPD (chronic obstructive pulmonary disease) ([place])   . Hyperlipidemia   . Breast cancer [facility])     left breast/lumpectomy/chemo/rad 2003    Past Surgical History  Procedure Laterality Date  . Breast lumpectomy    . Back surgery    . Colonoscopy  01/28/2012    Procedure: COLONOSCOPY;  Surgeon: Rogene Houston, MD;  [facility];  Service: Endoscopy;  Laterality: N/A;  1200  . Colonoscopy  01-2012  . Tonsillectomy      Patient was age 70  . Laparoscopic tubal ligation  1970  . Esophagogastroduodenoscopy N/A 07/01/2013    Procedure: ESOPHAGOGASTRODUODENOSCOPY (EGD);  Surgeon: Rogene Houston, MD;  [facility];  Service: Endoscopy;  Laterality: N/A;  1:10    There were no vitals filed for this visit.  Visit Diagnosis:  Left ankle pain  Local edema  Ankle stiffness, left  Difficulty walking  Unsteadiness      Subjective Assessment - 06/28/15 1434    Subjective Pt denied pain upon arrival but noted residual soreness over the lateral aspect of the  L ankle.  " My swelling is a lot better". L ankle pain ranges between a 0-1/10 on a VAS.    Limitations Walking;Standing   How long can you stand comfortably? 30 minutes    How long can you walk comfortably? 15-20 minutes    Patient Stated Goals Pt's goals include decreasing her L foot/ankle edema, improve her walking distance, and improve her balance.    Currently in Pain? No/denies   Pain Score 0-No pain   Pain Location Ankle   Pain Orientation Left   Multiple Pain Sites No                         [facility] Adult PT Treatment/Exercise - 06/28/15 0001    Knee/Hip Exercises: Standing   Lateral Step Up Left;2 sets;10 reps;Step Height: 6";Other (comment)   Forward Step Up 2 sets;10 reps;Step Height: 6";Left;Other (comment)   Manual Therapy   Edema Management Retrograde massage to L ankle in supine position    Myofascial Release L Gastroc-soleus in prone position   with use of palmar spreading technique and use of roller   Passive ROM L MTP, DIP, PIP flexion ext. ankle PF/DF, INV/ EV.    Ankle Exercises: Stretches   Soleus Stretch 3 reps;30  seconds   Gastroc Stretch 3 reps;30 seconds   Slant Board Stretch 3 reps;30 seconds   Ankle Exercises: Supine   T-Band RTB all directions   Ankle Exercises: Seated   ABC's Limitations HEP   Ankle Circles/Pumps AROM;10 reps   BAPS Level 2;10 reps;Sitting   Ankle Exercises: Standing   Heel Raises 10 reps;1 second;Other (comment)  1 set                  PT Short Term Goals - 06/11/15 1450    PT SHORT TERM GOAL #1   Title Patient will be independent in self-care management techniques for edema and pain, including but not limited to elevation, retromassage, and appropriate stretches/exercises in order to enhance safe self management of condition    Time 3   Period Weeks   Status New   PT SHORT TERM GOAL #2   Title Patient  will improve bilateral ankle dorsiflexion to at least 10 degrees in order to improve overall mobility and improve balance reaction skills    Time 3   Period Weeks   Status New   PT SHORT TERM GOAL #3   Title Patient to be able to perform inversion/eversion with L ankle to at least 15 degrees eversion and at least 25 degrees inversion with pain 0/10 in order to improve mechanics and general ankle function    Time 3   Period Weeks   Status New   PT SHORT TERM GOAL #4   Title Patient to be independent in correctly and consistently performing appropriate HEP, to be updated PRN    Time 3   Period Weeks   Status New   PT SHORT TERM GOAL #5   Title Patient will demonstrate no more than 0.5cm difference with circumferential measures between both ankles in order to reduce edema and pain, improve ROM    Time 3   Period Weeks   Status New           PT Long Term Goals - 06/11/15 1454    PT LONG TERM GOAL #1   Title Patient will demonstrate strength at least 4+/5 in all ankle planes with pain 0/10 in order to improve overall mobility and promote overall ankle stability    Time 6   Period Weeks   Status New   PT LONG TERM GOAL #2   Title Patient will be able to ambulate unlimited distances outside of boot, after MD has cleared patient to do so, with pain no more than 1/10 in order to assist patient in returning to PLOF    Time 6   Period Weeks   Status New   PT LONG TERM GOAL #3   Title Patient will be able to ambulate over uneven surfaces with pain no more than 1/10 and no loss of balance in order to assist her in being able to work in her yard and ambulate outdoors    Time 6   Period Weeks   Status New   PT LONG TERM GOAL #4   Title Patient to score at least 52 on BERG balance test in order to demonstrate improved balance skills and in general overall reduced fall risk    Time 6   Period Weeks   Status New               Plan - 06/28/15 1437    Clinical Impression  Statement PT tx focused on manual therapy techniques and ther ex in order to improve L  ankle mobility, flexibility, and strength. Limited L talocrural joint mobility palpated. Pt continues to tolerate manual therapy techniques and demo improved mobility of the L ankle in all directions. Improved muscle activation and coordination assessed with resisted L ankle inversion and eversion. In addition, L ankle edema continues to improve. Heel raises added to ther ex regimen this visit with good tolerance reported. Reviewed and completed L LE anterior and lateral step-ups with good tolerance and technique demo. Minimal to no cues required. L ankle/foot pain remained at baseline level t/o and at the end of today's PT visit. The pt is responding well to the current PT POC and would benefit from continued skilled PT to address remaining deficits.    Pt will benefit from skilled therapeutic intervention in order to improve on the following deficits Abnormal gait;Decreased range of motion;Difficulty walking;Increased muscle spasms;Decreased activity tolerance;Hypomobility;Pain;Impaired flexibility;Decreased mobility;Decreased strength;Increased edema   Rehab Potential Good   PT Frequency 2x / week   PT Duration 6 weeks   PT Treatment/Interventions ADLs/Self Care Home Management;Cryotherapy;Gait training;Stair training;Functional mobility training;Therapeutic activities;Therapeutic exercise;Balance training;Neuromuscular re-education;Patient/family education;Manual techniques   PT Next Visit Plan Continue with current PT POC with focus on CKC/OKC ankle strengthening, manual therapy techniques to improve joint mobility and reduce edema, and review HEP .  Reassess L ankle ROM at next visit.    PT Home Exercise Plan Reviewed current HEP with addition of 6" step-ups- pt declined HEP handout with excellent understanding verbalized and demo   Consulted and Agree with Plan of Care Patient        Problem List Patient  Active Problem List   Diagnosis Date Noted  . GERD (gastroesophageal reflux disease) 06/27/2013  . H/O: upper GI bleed 06/27/2013  . Epigastric pain 06/27/2013  . 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 [place], [place], [address] Phone: [phone]   Fax:  [phone]  Name: [name] MRN: [id number] Date of Birth: 04-Apr-1946

## 2015-07-02 DIAGNOSIS — S92152D Displaced avulsion fracture (chip fracture) of left talus, subsequent encounter for fracture with routine healing: Secondary | ICD-10-CM | POA: Diagnosis not present

## 2015-07-02 DIAGNOSIS — M25672 Stiffness of left ankle, not elsewhere classified: Secondary | ICD-10-CM | POA: Diagnosis not present

## 2015-07-02 DIAGNOSIS — M25472 Effusion, left ankle: Secondary | ICD-10-CM | POA: Diagnosis not present

## 2015-07-02 DIAGNOSIS — M25572 Pain in left ankle and joints of left foot: Secondary | ICD-10-CM | POA: Diagnosis not present

## 2015-07-03 ENCOUNTER — Ambulatory Visit (HOSPITAL_COMMUNITY): Payer: Commercial Managed Care - HMO | Admitting: Physical Therapy

## 2015-07-03 DIAGNOSIS — R262 Difficulty in walking, not elsewhere classified: Secondary | ICD-10-CM | POA: Diagnosis not present

## 2015-07-03 DIAGNOSIS — M25672 Stiffness of left ankle, not elsewhere classified: Secondary | ICD-10-CM | POA: Diagnosis not present

## 2015-07-03 DIAGNOSIS — M25572 Pain in left ankle and joints of left foot: Secondary | ICD-10-CM | POA: Diagnosis not present

## 2015-07-03 DIAGNOSIS — R600 Localized edema: Secondary | ICD-10-CM

## 2015-07-03 DIAGNOSIS — R2681 Unsteadiness on feet: Secondary | ICD-10-CM | POA: Diagnosis not present

## 2015-07-03 NOTE — Therapy (Signed)
[place], [place], [address] Phone: [phone]   Fax:  [phone]  Physical Therapy Treatment  Patient Details  Name: [name] MRN: [id number] Date of Birth: 30-Jan-1946 Referring Provider: Verda Cumins MD   Encounter Date: 07/03/2015      PT End of Session - 07/03/15 1613    Visit Number 7   Number of Visits 12   Date for PT Re-Evaluation 07/09/15   Authorization Type [employer]   Authorization Time Period 06/11/2015 to 08/09/2015   Authorization - Visit Number 7   Authorization - Number of Visits 10   PT Start Time [id number]   PT Stop Time 1602   PT Time Calculation (min) 44 min   Activity Tolerance Patient tolerated treatment well;No increased pain   Behavior During Therapy [facility] for tasks assessed/performed      Past Medical History  Diagnosis Date  . Pulmonary embolism ([place])   . Tachycardia   . Hemoptysis   . Iron deficiency   . GERD (gastroesophageal reflux disease)   . Diverticulitis   . COPD (chronic obstructive pulmonary disease) ([place])   . Hyperlipidemia   . Breast cancer [facility])     left breast/lumpectomy/chemo/rad 2003    Past Surgical History  Procedure Laterality Date  . Breast lumpectomy    . Back surgery    . Colonoscopy  01/28/2012    Procedure: COLONOSCOPY;  Surgeon: Rogene Houston, MD;  [facility];  Service: Endoscopy;  Laterality: N/A;  1200  . Colonoscopy  01-2012  . Tonsillectomy      Patient was age 70  . Laparoscopic tubal ligation  1970  . Esophagogastroduodenoscopy N/A 07/01/2013    Procedure: ESOPHAGOGASTRODUODENOSCOPY (EGD);  Surgeon: Rogene Houston, MD;  [facility];  Service: Endoscopy;  Laterality: N/A;  1:10    There were no vitals filed for this visit.  Visit Diagnosis:  Left ankle pain  Local edema  Ankle stiffness, left  Difficulty walking  Unsteadiness      Subjective Assessment - 07/03/15 1521    Subjective PT states she has a little discomfort dorsal ankle and around the medial lateral side of her ankle but not really pain.  Pt states she went to the orthopedic yesterday and he gave her TED stockings and was instucted to wear all day and remove at night.     Currently in Pain? No/denies            [facility] PT Assessment - 07/03/15 1559    AROM   Left Ankle Dorsiflexion 10  was 2 degrees   Left Ankle Plantar Flexion 45  was 37 degrees   Left Ankle Inversion 36  was 7 degrees   Left Ankle Eversion 30  was 13 degrees                     [facility] Adult PT Treatment/Exercise - 07/03/15 0001    Knee/Hip Exercises: Standing   Lateral Step Up Left;2 sets;10 reps;Step Height: 6";Other (comment)   Forward Step Up 2 sets;10 reps;Step Height: 6";Left;Other (comment)   Wall Squat 2 sets;10 reps;Other (comment)   Manual Therapy   Manual Therapy Edema management   Manual therapy comments prone.  Manual completed separate from therapeutic exericses at end of session   Edema Management Retrograde massage to L ankle in supine position    Ankle Exercises: Stretches   Slant Board Stretch 3 reps;30 seconds  Ankle Exercises: Standing   SLS L LE x 4 sets x 25 seconds with unilateral UE support   Rocker Board 2 minutes   Rocker Board Limitations In/Ev and A/P   Heel Raises 15 reps  toeraises 15 reps                  PT Short Term Goals - 06/11/15 1450    PT SHORT TERM GOAL #1   Title Patient will be independent in self-care management techniques for edema and pain, including but not limited to elevation, retromassage, and appropriate stretches/exercises in order to enhance safe self management of condition    Time 3   Period Weeks   Status New   PT SHORT TERM GOAL #2   Title Patient will improve bilateral ankle dorsiflexion to at least 10 degrees in order to improve overall mobility and improve balance reaction skills    Time 3   Period Weeks   Status New   PT  SHORT TERM GOAL #3   Title Patient to be able to perform inversion/eversion with L ankle to at least 15 degrees eversion and at least 25 degrees inversion with pain 0/10 in order to improve mechanics and general ankle function    Time 3   Period Weeks   Status New   PT SHORT TERM GOAL #4   Title Patient to be independent in correctly and consistently performing appropriate HEP, to be updated PRN    Time 3   Period Weeks   Status New   PT SHORT TERM GOAL #5   Title Patient will demonstrate no more than 0.5cm difference with circumferential measures between both ankles in order to reduce edema and pain, improve ROM    Time 3   Period Weeks   Status New           PT Long Term Goals - 06/11/15 1454    PT LONG TERM GOAL #1   Title Patient will demonstrate strength at least 4+/5 in all ankle planes with pain 0/10 in order to improve overall mobility and promote overall ankle stability    Time 6   Period Weeks   Status New   PT LONG TERM GOAL #2   Title Patient will be able to ambulate unlimited distances outside of boot, after MD has cleared patient to do so, with pain no more than 1/10 in order to assist patient in returning to PLOF    Time 6   Period Weeks   Status New   PT LONG TERM GOAL #3   Title Patient will be able to ambulate over uneven surfaces with pain no more than 1/10 and no loss of balance in order to assist her in being able to work in her yard and ambulate outdoors    Time 6   Period Weeks   Status New   PT LONG TERM GOAL #4   Title Patient to score at least 52 on BERG balance test in order to demonstrate improved balance skills and in general overall reduced fall risk    Time 6   Period Weeks   Status New               Plan - 07/03/15 1616    Clinical Impression Statement Focused session today on increasing ankle stabilty and strength.  AROM remeasured today with ankle motion now functional.  Pt wtih decreasing lateral ankle pain with most discomfort  continuing anteiror dorsal ankle region.  Overall reduced swelling in  ankle since wearing TED hose, however explained compression hose would be more appropriate for day time wear and help more with swelling.    PT Next Visit Plan Progress stability exericses with less focus on edema at this point.  Begin BAPS in standing and vectors next session.   Consulted and Agree with Plan of Care Patient        Problem List Patient Active Problem List   Diagnosis Date Noted  . GERD (gastroesophageal reflux disease) 06/27/2013  . H/O: upper GI bleed 06/27/2013  . Epigastric pain 06/27/2013  . Tachycardia-bradycardia [facility]) 11/21/2010    Teena Irani, PTA/CLT [phone]  07/03/2015, 4:20 PM  [facility] [address] [place], [place], [address] Phone: [phone]   Fax:  [phone]  Name: [name] MRN: [id number] Date of Birth: 1945-07-19

## 2015-07-05 ENCOUNTER — Ambulatory Visit (HOSPITAL_COMMUNITY): Payer: Commercial Managed Care - HMO | Attending: Sports Medicine

## 2015-07-05 DIAGNOSIS — R2681 Unsteadiness on feet: Secondary | ICD-10-CM | POA: Diagnosis not present

## 2015-07-05 DIAGNOSIS — M25672 Stiffness of left ankle, not elsewhere classified: Secondary | ICD-10-CM

## 2015-07-05 DIAGNOSIS — M25572 Pain in left ankle and joints of left foot: Secondary | ICD-10-CM | POA: Diagnosis not present

## 2015-07-05 DIAGNOSIS — R600 Localized edema: Secondary | ICD-10-CM | POA: Diagnosis not present

## 2015-07-05 DIAGNOSIS — R262 Difficulty in walking, not elsewhere classified: Secondary | ICD-10-CM | POA: Insufficient documentation

## 2015-07-05 NOTE — Therapy (Signed)
[facility] [address] [place], [place], [address] Phone: [phone]   Fax:  [phone]  Physical Therapy Treatment  Patient Details  Name: [name] MRN: [id number] Date of Birth: 1945-11-16 Referring Provider: Verda Cumins MD   Encounter Date: 07/05/2015    Past Medical History  Diagnosis Date  . Pulmonary embolism ([place])   . Tachycardia   . Hemoptysis   . Iron deficiency   . GERD (gastroesophageal reflux disease)   . Diverticulitis   . COPD (chronic obstructive pulmonary disease) ([place])   . Hyperlipidemia   . Breast cancer [facility])     left breast/lumpectomy/chemo/rad 2003    Past Surgical History  Procedure Laterality Date  . Breast lumpectomy    . Back surgery    . Colonoscopy  01/28/2012    Procedure: COLONOSCOPY;  Surgeon: Rogene Houston, MD;  [facility];  Service: Endoscopy;  Laterality: N/A;  1200  . Colonoscopy  01-2012  . Tonsillectomy      Patient was age 9  . Laparoscopic tubal ligation  1970  . Esophagogastroduodenoscopy N/A 07/01/2013    Procedure: ESOPHAGOGASTRODUODENOSCOPY (EGD);  Surgeon: Rogene Houston, MD;  [facility];  Service: Endoscopy;  Laterality: N/A;  1:10    There were no vitals filed for this visit.  Visit Diagnosis:  Left ankle pain  Local edema  Ankle stiffness, left  Difficulty walking  Unsteadiness      Subjective Assessment - 07/05/15 1440    Subjective Pt reports that she saw Dr. Drema Dallas this past Monday, 07/02/15, and was provided with TED stockings due to conintued c/o L ankle edema. Pt noted that her edema has significantly improved since her stockings were provided. Lateral ankle discomfort reported that was rated a 1/10 on a  VAS. Pt further noted that she has a MRI scheduled for next Wednesday for further assessment of the L ankle.     Pertinent History Tachycardia   Limitations Walking;Standing   How long can you sit comfortably? Unlimited    How long can you stand comfortably? 07/05/15= 60 minutes    How long can you walk comfortably? 07/05/15= 30 minutes    Patient Stated Goals Pt's goals include decreasing her L foot/ankle edema, improve her walking distance, and improve her balance.    Currently in Pain? Yes   Pain Score 1    Pain Location Ankle   Pain Orientation Left   Pain Descriptors / Indicators Aching   Pain Radiating Towards None    Pain Onset More than a month ago   Pain Frequency Intermittent   Aggravating Factors  long distance ambulation   Pain Relieving Factors rest and pain meds   Multiple Pain Sites No                         [facility] Adult PT Treatment/Exercise - 07/05/15 0001    Knee/Hip Exercises: Standing   Forward Step Up 2 sets;10 reps;Step Height: 6";Left;Other (comment)   Manual Therapy   Manual Therapy Edema management;Soft tissue mobilization   Manual therapy comments prone.  Manual completed separate from therapeutic exericses at end of session   Edema Management Retrograde massage to L ankle in supine position    Soft tissue mobilization cross friction to L peroneal muscles superior to the malleolus; 2 minutes x 2 sets    Passive ROM L MTP, DIP, PIP flexion ext. ankle PF/DF, INV/ EV.    Ankle  Exercises: Stretches   Plantar Fascia Stretch 3 reps;60 seconds;Other (comment)  Manual to L PF   Soleus Stretch 3 reps;30 seconds   Gastroc Stretch 3 reps;30 seconds   Ankle Exercises: Supine   T-Band L ankle 4-way with red thera-band x 2 sets of 15 reps    Ankle Exercises: Standing   Rocker Board 2 minutes   Rocker Board Limitations In/Ev and A/P   Ankle Exercises: Aerobic   Stationary Bike 5 minutes on level 1                PT Education - 07/05/15 1653    Education provided Yes   Education Details Edcuated pt on L LE elevation 3-4x/day, cryotherapy, HEP, and to wear closed heel shoes   Person(s) Educated Patient   Methods Explanation;Demonstration   Comprehension  Verbalized understanding;Returned demonstration          PT Short Term Goals - 06/11/15 1450    PT SHORT TERM GOAL #1   Title Patient will be independent in self-care management techniques for edema and pain, including but not limited to elevation, retromassage, and appropriate stretches/exercises in order to enhance safe self management of condition    Time 3   Period Weeks   Status New   PT SHORT TERM GOAL #2   Title Patient will improve bilateral ankle dorsiflexion to at least 10 degrees in order to improve overall mobility and improve balance reaction skills    Time 3   Period Weeks   Status New   PT SHORT TERM GOAL #3   Title Patient to be able to perform inversion/eversion with L ankle to at least 15 degrees eversion and at least 25 degrees inversion with pain 0/10 in order to improve mechanics and general ankle function    Time 3   Period Weeks   Status New   PT SHORT TERM GOAL #4   Title Patient to be independent in correctly and consistently performing appropriate HEP, to be updated PRN    Time 3   Period Weeks   Status New   PT SHORT TERM GOAL #5   Title Patient will demonstrate no more than 0.5cm difference with circumferential measures between both ankles in order to reduce edema and pain, improve ROM    Time 3   Period Weeks   Status New           PT Long Term Goals - 06/11/15 1454    PT LONG TERM GOAL #1   Title Patient will demonstrate strength at least 4+/5 in all ankle planes with pain 0/10 in order to improve overall mobility and promote overall ankle stability    Time 6   Period Weeks   Status New   PT LONG TERM GOAL #2   Title Patient will be able to ambulate unlimited distances outside of boot, after MD has cleared patient to do so, with pain no more than 1/10 in order to assist patient in returning to PLOF    Time 6   Period Weeks   Status New   PT LONG TERM GOAL #3   Title Patient will be able to ambulate over uneven surfaces with pain no more  than 1/10 and no loss of balance in order to assist her in being able to work in her yard and ambulate outdoors    Time 6   Period Weeks   Status New   PT LONG TERM GOAL #4   Title Patient to score at least  52 on BERG balance test in order to demonstrate improved balance skills and in general overall reduced fall risk    Time 6   Period Weeks   Status New               Plan - 07/05/15 1654    Clinical Impression Statement Palpable tenderness assessed at distal L peroneal tendon/muscle. Completed cross friction massage x 2 minutes x 2 sets with less severe tenderness reported. Minor edema assessed of the L ankle, which appears to have improved since TED stockings were prescribed. Strongly encouraged the pt to elevate the L LE t/o the day in order to manage edema more effectively. Pt verbalized full understanding. Improved L ankle mobility and ROM assessed post manual therapy techniques. Progressed from red to green thera-band with ankle 4-way with good tolerance reported. Progressed to standing ther ex including front step-ups and dorsiflexion/plantarflexion/inversion/eversion with rocker board. L ankle pain was reduced to a 0/10 on a VAS at the completion of today's PT visit. Pt would benefit from continued skilled PT to improve L ankle stabilization, improve soft tissue extensibility, and further reduce pain and edema.    Pt will benefit from skilled therapeutic intervention in order to improve on the following deficits Abnormal gait;Decreased range of motion;Difficulty walking;Increased muscle spasms;Decreased activity tolerance;Hypomobility;Pain;Impaired flexibility;Decreased mobility;Decreased strength;Increased edema   Rehab Potential Good   PT Frequency 2x / week   PT Duration 6 weeks   PT Treatment/Interventions ADLs/Self Care Home Management;Cryotherapy;Gait training;Stair training;Functional mobility training;Therapeutic activities;Therapeutic exercise;Balance training;Neuromuscular  re-education;Patient/family education;Manual techniques   PT Next Visit Plan Progress with L ankle CKC stabilization ther ex and cross friction massage to L peroneal muscles.    PT Home Exercise Plan Reviewed current HEP with no changes made this visit    Consulted and Agree with Plan of Care Patient        Problem List Patient Active Problem List   Diagnosis Date Noted  . GERD (gastroesophageal reflux disease) 06/27/2013  . H/O: upper GI bleed 06/27/2013  . Epigastric pain 06/27/2013  . 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 [place], [place], [address] Phone: [phone]   Fax:  [phone]  Name: [name] MRN: [id number] Date of Birth: 02-01-1946

## 2015-07-10 ENCOUNTER — Ambulatory Visit (HOSPITAL_COMMUNITY): Payer: Commercial Managed Care - HMO | Admitting: Physical Therapy

## 2015-07-10 ENCOUNTER — Telehealth (HOSPITAL_COMMUNITY): Payer: Self-pay

## 2015-07-10 DIAGNOSIS — R2681 Unsteadiness on feet: Secondary | ICD-10-CM

## 2015-07-10 DIAGNOSIS — M25572 Pain in left ankle and joints of left foot: Secondary | ICD-10-CM

## 2015-07-10 DIAGNOSIS — R262 Difficulty in walking, not elsewhere classified: Secondary | ICD-10-CM | POA: Diagnosis not present

## 2015-07-10 DIAGNOSIS — M25672 Stiffness of left ankle, not elsewhere classified: Secondary | ICD-10-CM | POA: Diagnosis not present

## 2015-07-10 DIAGNOSIS — R600 Localized edema: Secondary | ICD-10-CM | POA: Diagnosis not present

## 2015-07-10 NOTE — Telephone Encounter (Signed)
Going to a funeral.

## 2015-07-10 NOTE — Therapy (Signed)
[facility] [address] [place], [place], [address] Phone: [phone]   Fax:  [phone]  Physical Therapy Treatment (Re-Assessment)  Patient Details  Name: [name] MRN: [id number] Date of Birth: [birth year] Referring Provider: Verda Cumins MD   Encounter Date: 07/10/2015      PT End of Session - 07/10/15 1546    Visit Number 8   Number of Visits 16   Date for PT Re-Evaluation 08/07/15   Authorization Type [employer]   Authorization Time Period 06/11/2015 to 08/09/2015   Authorization - Visit Number 8   Authorization - Number of Visits 18   PT Start Time [id number]   PT Stop Time 1514   PT Time Calculation (min) 42 min   Activity Tolerance Patient tolerated treatment well   Behavior During Therapy [facility] for tasks assessed/performed      Past Medical History  Diagnosis Date  . Pulmonary embolism ([place])   . Tachycardia   . Hemoptysis   . Iron deficiency   . GERD (gastroesophageal reflux disease)   . Diverticulitis   . COPD (chronic obstructive pulmonary disease) ([place])   . Hyperlipidemia   . Breast cancer [facility])     left breast/lumpectomy/chemo/rad 2003    Past Surgical History  Procedure Laterality Date  . Breast lumpectomy    . Back surgery    . Colonoscopy  01/28/2012    Procedure: COLONOSCOPY;  Surgeon: Rogene Houston, MD;  [facility]: [facility];  Service: Endoscopy;  Laterality: N/A;  1200  . Colonoscopy  01-2012  . Tonsillectomy      Patient was age 71  . Laparoscopic tubal ligation  1970  . Esophagogastroduodenoscopy N/A 07/01/2013    Procedure: ESOPHAGOGASTRODUODENOSCOPY (EGD);  Surgeon: Rogene Houston, MD;  Location: [facility];  Service: Endoscopy;  Laterality: N/A;  1:10    There were no vitals filed for this visit.  Visit Diagnosis:  Left ankle pain  Local edema  Ankle stiffness, left  Difficulty walking  Unsteadiness      Subjective Assessment - 07/10/15 1434    Subjective  Patient reports that she is having that "itching feeling that means its healing"; reports that she is feeling better with therapy. Saw Dr. Drema Dallas last week who gave her stocking compression to wear. Literally everything is getting easier for her to do; not having any big problems or concerns with things.    Pertinent History Tachycardia   How long can you sit comfortably? 2/7- unlimited    How long can you stand comfortably? 2/7- 60 minutes    How long can you walk comfortably? 2/7- no limits    Patient Stated Goals Pt's goals include decreasing her L foot/ankle edema, improve her walking distance, and improve her balance.    Currently in Pain? No/denies            [facility] PT Assessment - 07/10/15 0001    Observation/Other Assessments   Observations 53cm L, 52cm R circumferential    Focus on Therapeutic Outcomes (FOTO)  11% limited    AROM   Right Ankle Dorsiflexion 9  AAROM; 4 AROM    Right Ankle Plantar Flexion 60   Right Ankle Inversion 29   Right Ankle Eversion 15   Left Ankle Dorsiflexion 11   Left Ankle Plantar Flexion 50   Left Ankle Inversion 15   Left Ankle Eversion 18   Strength   Right Ankle Dorsiflexion 5/5   Right Ankle Inversion 5/5  Right Ankle Eversion 5/5   Left Ankle Dorsiflexion 4-/5   Left Ankle Inversion 4-/5   Left Ankle Eversion 4/5   6 minute walk test results    Aerobic Endurance Distance Walked [id number]   Endurance additional comments 6MWT; gait speed 1.30m/s    Berg Balance Test   Sit to Stand Able to stand without using hands and stabilize independently   Standing Unsupported Able to stand safely 2 minutes   Sitting with Back Unsupported but Feet Supported on Floor or Stool Able to sit safely and securely 2 minutes   Stand to Sit Sits safely with minimal use of hands   Transfers Able to transfer safely, minor use of hands   Standing Unsupported with Eyes Closed Able to stand 10 seconds safely   Standing Ubsupported with Feet Together Able to place  feet together independently and stand 1 minute safely   From Standing, Reach Forward with Outstretched Arm Can reach confidently >25 cm (10")   From Standing Position, Pick up Object from Floor Able to pick up shoe safely and easily   From Standing Position, Turn to Look Behind Over each Shoulder Looks behind one side only/other side shows less weight shift   Turn 360 Degrees Able to turn 360 degrees safely one side only in 4 seconds or less   Standing Unsupported, Alternately Place Feet on Step/Stool Able to stand independently and complete 8 steps >20 seconds   Standing Unsupported, One Foot in Front Able to plae foot ahead of the other independently and hold 30 seconds   Standing on One Leg Able to lift leg independently and hold 5-10 seconds   Total Score 51                             PT Education - 07/10/15 1545    Education provided Yes   Education Details progress with skilled PT services, plan of care moving forward    Person(s) Educated Patient   Methods Explanation   Comprehension Verbalized understanding          PT Short Term Goals - 07/10/15 1550    PT SHORT TERM GOAL #1   Title Patient will be independent in self-care management techniques for edema and pain, including but not limited to elevation, retromassage, and appropriate stretches/exercises in order to enhance safe self management of condition    Time 3   Period Weeks   Status On-going   PT SHORT TERM GOAL #2   Title Patient will improve bilateral ankle dorsiflexion to at least 10 degrees in order to improve overall mobility and improve balance reaction skills    Time 3   Period Weeks   Status On-going   PT SHORT TERM GOAL #3   Title Patient to be able to perform inversion/eversion with L ankle to at least 15 degrees eversion and at least 25 degrees inversion with pain 0/10 in order to improve mechanics and general ankle function    Time 3   Period Weeks   Status On-going   PT SHORT  TERM GOAL #4   Title Patient to be independent in correctly and consistently performing appropriate HEP, to be updated PRN    Time 3   Period Weeks   Status On-going   PT SHORT TERM GOAL #5   Title Patient will demonstrate no more than 0.5cm difference with circumferential measures between both ankles in order to reduce edema and pain, improve ROM  Time 3   Period Weeks   Status On-going           PT Long Term Goals - 2015/07/26 1550    PT LONG TERM GOAL #1   Title Patient will demonstrate strength at least 4+/5 in all ankle planes with pain 0/10 in order to improve overall mobility and promote overall ankle stability    Time 6   Period Weeks   Status On-going   PT LONG TERM GOAL #2   Title Patient will be able to ambulate unlimited distances outside of boot, after MD has cleared patient to do so, with pain no more than 1/10 in order to assist patient in returning to PLOF    Time 6   Period Weeks   Status Achieved   PT LONG TERM GOAL #3   Title Patient will be able to ambulate over uneven surfaces with pain no more than 1/10 and no loss of balance in order to assist her in being able to work in her yard and ambulate outdoors    Time 6   Period Weeks   Status On-going   PT LONG TERM GOAL #4   Title Patient to score at least 52 on BERG balance test in order to demonstrate improved balance skills and in general overall reduced fall risk    Time 6   Period Weeks   Status Achieved               Plan - Jul 26, 2015 1546    Clinical Impression Statement Re-assessment performed today. Patient contintues to demonstrate L ankle edema, ankle weakness, and some difficulty with balance and ankle stability at this point; patient reports that she did have a fall the other day as well stepping in a dtich in her yard, but no associated injury or concern. Patient reports no major functional limitations, however based on skilled clinical assessment recommend continuing skilled PT services in  order to address remaining functional limitations and assist in reaching optimal level of function.    Pt will benefit from skilled therapeutic intervention in order to improve on the following deficits Abnormal gait;Decreased range of motion;Difficulty walking;Increased muscle spasms;Decreased activity tolerance;Hypomobility;Pain;Impaired flexibility;Decreased mobility;Decreased strength;Increased edema   Rehab Potential Good   PT Frequency 2x / week   PT Duration 4 weeks   PT Treatment/Interventions ADLs/Self Care Home Management;Cryotherapy;Gait training;Stair training;Functional mobility training;Therapeutic activities;Therapeutic exercise;Balance training;Neuromuscular re-education;Patient/family education;Manual techniques   PT Next Visit Plan Progress with L ankle CKC stabilization ther ex and cross friction massage to L peroneal muscles.    PT Home Exercise Plan Reviewed current HEP with no changes made this visit    Consulted and Agree with Plan of Care Patient     G-CODES 07/26/15 MOBILITY AND WALKING AROUND BASED ON FOTO AND SKILLED CLINICAL ASSESS CURRENT CJ GOAL CI   Problem List Patient Active Problem List   Diagnosis Date Noted  . GERD (gastroesophageal reflux disease) 06/27/2013  . H/O: upper GI bleed 06/27/2013  . Epigastric pain 06/27/2013  . 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  [place], [place], [address] Phone: [phone]   Fax:  [phone]  Name: KURSTEN FINNEGAN MRN: [id number] Date of Birth: 20-Jul-1945

## 2015-07-11 DIAGNOSIS — S92152D Displaced avulsion fracture (chip fracture) of left talus, subsequent encounter for fracture with routine healing: Secondary | ICD-10-CM | POA: Diagnosis not present

## 2015-07-11 DIAGNOSIS — M25572 Pain in left ankle and joints of left foot: Secondary | ICD-10-CM | POA: Diagnosis not present

## 2015-07-12 ENCOUNTER — Encounter (HOSPITAL_COMMUNITY): Payer: Commercial Managed Care - HMO

## 2015-07-17 ENCOUNTER — Ambulatory Visit (HOSPITAL_COMMUNITY): Payer: Commercial Managed Care - HMO | Admitting: Physical Therapy

## 2015-07-17 DIAGNOSIS — R262 Difficulty in walking, not elsewhere classified: Secondary | ICD-10-CM | POA: Diagnosis not present

## 2015-07-17 DIAGNOSIS — R2681 Unsteadiness on feet: Secondary | ICD-10-CM

## 2015-07-17 DIAGNOSIS — M25672 Stiffness of left ankle, not elsewhere classified: Secondary | ICD-10-CM | POA: Diagnosis not present

## 2015-07-17 DIAGNOSIS — R600 Localized edema: Secondary | ICD-10-CM | POA: Diagnosis not present

## 2015-07-17 DIAGNOSIS — M25572 Pain in left ankle and joints of left foot: Secondary | ICD-10-CM | POA: Diagnosis not present

## 2015-07-17 NOTE — Therapy (Signed)
[facility] [place], [place], [address] Phone: [phone]   Fax:  [phone]  Physical Therapy Treatment  Patient Details  Name: [name] MRN: [id number] Date of Birth: July 27, 1945 Referring Provider: Verda Cumins MD   Encounter Date: 07/17/2015      PT End of Session - 07/17/15 1512    Visit Number 9   Number of Visits 12   Date for PT Re-Evaluation 08/07/15   Authorization Type [employer]   Authorization Time Period 06/11/2015 to 08/09/2015   Authorization - Visit Number 9   Authorization - Number of Visits 18   PT Start Time [id number]   PT Stop Time 1512   PT Time Calculation (min) 39 min   Activity Tolerance Patient tolerated treatment well   Behavior During Therapy [facility] for tasks assessed/performed      Past Medical History  Diagnosis Date  . Pulmonary embolism ([place])   . Tachycardia   . Hemoptysis   . Iron deficiency   . GERD (gastroesophageal reflux disease)   . Diverticulitis   . COPD (chronic obstructive pulmonary disease) ([place])   . Hyperlipidemia   . Breast cancer [facility])     left breast/lumpectomy/chemo/rad 2003    Past Surgical History  Procedure Laterality Date  . Breast lumpectomy    . Back surgery    . Colonoscopy  01/28/2012    Procedure: COLONOSCOPY;  Surgeon: Rogene Houston, MD;  [facility];  Service: Endoscopy;  Laterality: N/A;  1200  . Colonoscopy  01-2012  . Tonsillectomy      Patient was age 70  . Laparoscopic tubal ligation  1970  . Esophagogastroduodenoscopy N/A 07/01/2013    Procedure: ESOPHAGOGASTRODUODENOSCOPY (EGD);  Surgeon: Rogene Houston, MD;  Location[facility];  Service: Endoscopy;  Laterality: N/A;  1:10    There were no vitals filed for this visit.  Visit Diagnosis:  Left ankle pain  Local edema  Ankle stiffness, left  Difficulty walking  Unsteadiness      Subjective Assessment - 07/17/15 1436    Subjective Patient reoprts  that she is having a bit more pain today, it has been recently hurting more around the top of her foot; she went to a funeral out at the [place] recently and just got back last ngiht.    Pertinent History Tachycardia   Currently in Pain? Yes   Pain Score 3    Pain Location Ankle   Pain Orientation Left   Pain Descriptors / Indicators Aching   Pain Type Chronic pain   Pain Radiating Towards pain in ankle and across top of foot, not radiating    Pain Onset More than a month ago   Pain Frequency Intermittent   Aggravating Factors  worst pain is when she is sitting, cannot find a comfortable way to position foot    Pain Relieving Factors finding a comfortable position for foot    Effect of Pain on Daily Activities doesn't really stop her from doing much of anything    Multiple Pain Sites No                         [facility] Adult PT Treatment/Exercise - 07/17/15 0001    Ankle Exercises: Stretches   Plantar Fascia Stretch 3 reps;30 seconds   Slant Board Stretch 3 reps;30 seconds   Ankle Exercises: Seated   ABC's 2 reps   Ankle Circles/Pumps AROM;15 reps  Ankle Exercises: Supine   T-Band L ankle 4 way with Red TB 2x10, gravity present    Ankle Exercises: Standing   Rocker Board 2 minutes   Rocker Board Limitations [facility] and [place]    Heel Raises 15 reps;Other (comment)  heel-toe    Other Standing Ankle Exercises Airex weight shifting   Other Standing Ankle Exercises SLS L ankle x3; triple cone taps x5 L LE                 PT Education - 07/17/15 1512    Education provided No          PT Short Term Goals - 07/10/15 1550    PT SHORT TERM GOAL #1   Title Patient will be independent in self-care management techniques for edema and pain, including but not limited to elevation, retromassage, and appropriate stretches/exercises in order to enhance safe self management of condition    Time 3   Period Weeks   Status On-going   PT SHORT TERM GOAL #2   Title Patient  will improve bilateral ankle dorsiflexion to at least 10 degrees in order to improve overall mobility and improve balance reaction skills    Time 3   Period Weeks   Status On-going   PT SHORT TERM GOAL #3   Title Patient to be able to perform inversion/eversion with L ankle to at least 15 degrees eversion and at least 25 degrees inversion with pain 0/10 in order to improve mechanics and general ankle function    Time 3   Period Weeks   Status On-going   PT SHORT TERM GOAL #4   Title Patient to be independent in correctly and consistently performing appropriate HEP, to be updated PRN    Time 3   Period Weeks   Status On-going   PT SHORT TERM GOAL #5   Title Patient will demonstrate no more than 0.5cm difference with circumferential measures between both ankles in order to reduce edema and pain, improve ROM    Time 3   Period Weeks   Status On-going           PT Long Term Goals - 07/10/15 1550    PT LONG TERM GOAL #1   Title Patient will demonstrate strength at least 4+/5 in all ankle planes with pain 0/10 in order to improve overall mobility and promote overall ankle stability    Time 6   Period Weeks   Status On-going   PT LONG TERM GOAL #2   Title Patient will be able to ambulate unlimited distances outside of boot, after MD has cleared patient to do so, with pain no more than 1/10 in order to assist patient in returning to PLOF    Time 6   Period Weeks   Status Achieved   PT LONG TERM GOAL #3   Title Patient will be able to ambulate over uneven surfaces with pain no more than 1/10 and no loss of balance in order to assist her in being able to work in her yard and ambulate outdoors    Time 6   Period Weeks   Status On-going   PT LONG TERM GOAL #4   Title Patient to score at least 52 on BERG balance test in order to demonstrate improved balance skills and in general overall reduced fall risk    Time 6   Period Weeks   Status Achieved  Plan -  07/17/15 1458    Clinical Impression Statement Focused on ankle mobility and strength today as well as closed chain strengthening and balance tasks. Perofmred all exercises today within pain limitations as patient is reporting more pain across the top of her foot, however did not have fall or any other incident that would have casued an acute injury or irritation/aggravation  of originial injury. Noted ankle muscle fatigue with strengthening and stability exercises today. Pateint reports that she is getting new imaging results back from MD tomorrow.    Pt will benefit from skilled therapeutic intervention in order to improve on the following deficits Abnormal gait;Decreased range of motion;Difficulty walking;Increased muscle spasms;Decreased activity tolerance;Hypomobility;Pain;Impaired flexibility;Decreased mobility;Decreased strength;Increased edema   Rehab Potential Good   PT Frequency 2x / week   PT Duration 4 weeks   PT Treatment/Interventions ADLs/Self Care Home Management;Cryotherapy;Gait training;Stair training;Functional mobility training;Therapeutic activities;Therapeutic exercise;Balance training;Neuromuscular re-education;Patient/family education;Manual techniques   PT Next Visit Plan Progress with L ankle CKC stabilization ther ex and cross friction massage to L peroneal muscles.    PT Home Exercise Plan Reviewed current HEP with no changes made this visit    Consulted and Agree with Plan of Care Patient        Problem List Patient Active Problem List   Diagnosis Date Noted  . GERD (gastroesophageal reflux disease) 06/27/2013  . H/O: upper GI bleed 06/27/2013  . Epigastric pain 06/27/2013  . Tachycardia-bradycardia [facility]) 11/21/2010    Deniece Ree PT, DPT [phone]  [facility] [address] [place], [place], [address] Phone: [phone]   Fax:  [phone]  Name: KIMISHA KEITH MRN: [id number] Date of Birth:  January 21, 1946

## 2015-07-18 DIAGNOSIS — M25572 Pain in left ankle and joints of left foot: Secondary | ICD-10-CM | POA: Diagnosis not present

## 2015-07-18 DIAGNOSIS — S92152D Displaced avulsion fracture (chip fracture) of left talus, subsequent encounter for fracture with routine healing: Secondary | ICD-10-CM | POA: Diagnosis not present

## 2015-07-18 DIAGNOSIS — M25672 Stiffness of left ankle, not elsewhere classified: Secondary | ICD-10-CM | POA: Diagnosis not present

## 2015-07-18 DIAGNOSIS — M25472 Effusion, left ankle: Secondary | ICD-10-CM | POA: Diagnosis not present

## 2015-07-19 ENCOUNTER — Ambulatory Visit (HOSPITAL_COMMUNITY): Payer: Commercial Managed Care - HMO | Admitting: Physical Therapy

## 2015-07-19 DIAGNOSIS — R600 Localized edema: Secondary | ICD-10-CM | POA: Diagnosis not present

## 2015-07-19 DIAGNOSIS — M25572 Pain in left ankle and joints of left foot: Secondary | ICD-10-CM

## 2015-07-19 DIAGNOSIS — R262 Difficulty in walking, not elsewhere classified: Secondary | ICD-10-CM

## 2015-07-19 DIAGNOSIS — M25672 Stiffness of left ankle, not elsewhere classified: Secondary | ICD-10-CM | POA: Diagnosis not present

## 2015-07-19 DIAGNOSIS — R2681 Unsteadiness on feet: Secondary | ICD-10-CM

## 2015-07-19 NOTE — Therapy (Signed)
[place], [place], [address] Phone: [phone]   Fax:  [phone]  Physical Therapy Treatment  Patient Details  Name: [name] MRN: [id number] Date of Birth: 1945-08-03 Referring Provider: Verda Cumins MD   Encounter Date: 07/19/2015      PT End of Session - 07/19/15 1527    Visit Number 10   Number of Visits 12   Date for PT Re-Evaluation 08/07/15   Authorization Type [employer] Rm Griselda Miner done 8th session)   Authorization Time Period 06/11/2015 to 08/09/2015   Authorization - Visit Number 10   Authorization - Number of Visits 18   PT Start Time [id number]   PT Stop Time 1515   PT Time Calculation (min) 41 min   Activity Tolerance Patient tolerated treatment well   Behavior During Therapy [facility] for tasks assessed/performed      Past Medical History  Diagnosis Date  . Pulmonary embolism ([place])   . Tachycardia   . Hemoptysis   . Iron deficiency   . GERD (gastroesophageal reflux disease)   . Diverticulitis   . COPD (chronic obstructive pulmonary disease) ([place])   . Hyperlipidemia   . Breast cancer [facility])     left breast/lumpectomy/chemo/rad 2003    Past Surgical History  Procedure Laterality Date  . Breast lumpectomy    . Back surgery    . Colonoscopy  01/28/2012    Procedure: COLONOSCOPY;  Surgeon: Rogene Houston, MD;  [facility];  Service: Endoscopy;  Laterality: N/A;  1200  . Colonoscopy  01-2012  . Tonsillectomy      Patient was age 70  . Laparoscopic tubal ligation  1970  . Esophagogastroduodenoscopy N/A 07/01/2013    Procedure: ESOPHAGOGASTRODUODENOSCOPY (EGD);  Surgeon: Rogene Houston, MD;  Location: [facility];  Service: Endoscopy;  Laterality: N/A;  1:10    There were no vitals filed for this visit.  Visit Diagnosis:  Left ankle pain  Local edema  Ankle stiffness, left  Difficulty walking  Unsteadiness      Subjective Assessment - 07/19/15 1438    Subjective Patient arrives with new extension of MD order for ongiong skilled PT services; also brings imaging report detailing recent MRI.    Pertinent History Tachycardia   Currently in Pain? No/denies                         [facility] Adult PT Treatment/Exercise - 07/19/15 0001    Ankle Exercises: Stretches   Plantar Fascia Stretch 3 reps;30 seconds   Slant Board Stretch 3 reps;30 seconds   Other Stretch hamstring stretch 3x30 seconds    Ankle Exercises: Seated   ABC's 3 reps   Ankle Circles/Pumps AROM;20 reps   Ankle Exercises: Standing   Rocker Board 2 minutes   Rocker Board Limitations AP and latearl    Heel Raises 15 reps;Other (comment)  heel and toe    Other Standing Ankle Exercises toe and heel walking, 3 reps blue line each    Other Standing Ankle Exercises 3 way toe taps with SLS on L foot; weight shifts onto red airex pad with min guard                 PT Education - 07/19/15 1526    Education provided Yes   Education Details general education about terms MD used to describe imaging report to patient, as she did have questions about what he told  her about the image    Person(s) Educated Patient   Methods Explanation   Comprehension Verbalized understanding          PT Short Term Goals - 07/10/15 1550    PT SHORT TERM GOAL #1   Title Patient will be independent in self-care management techniques for edema and pain, including but not limited to elevation, retromassage, and appropriate stretches/exercises in order to enhance safe self management of condition    Time 3   Period Weeks   Status On-going   PT SHORT TERM GOAL #2   Title Patient will improve bilateral ankle dorsiflexion to at least 10 degrees in order to improve overall mobility and improve balance reaction skills    Time 3   Period Weeks   Status On-going   PT SHORT TERM GOAL #3   Title Patient to be able to perform inversion/eversion with L ankle to at least 15 degrees eversion  and at least 25 degrees inversion with pain 0/10 in order to improve mechanics and general ankle function    Time 3   Period Weeks   Status On-going   PT SHORT TERM GOAL #4   Title Patient to be independent in correctly and consistently performing appropriate HEP, to be updated PRN    Time 3   Period Weeks   Status On-going   PT SHORT TERM GOAL #5   Title Patient will demonstrate no more than 0.5cm difference with circumferential measures between both ankles in order to reduce edema and pain, improve ROM    Time 3   Period Weeks   Status On-going           PT Long Term Goals - 07/10/15 1550    PT LONG TERM GOAL #1   Title Patient will demonstrate strength at least 4+/5 in all ankle planes with pain 0/10 in order to improve overall mobility and promote overall ankle stability    Time 6   Period Weeks   Status On-going   PT LONG TERM GOAL #2   Title Patient will be able to ambulate unlimited distances outside of boot, after MD has cleared patient to do so, with pain no more than 1/10 in order to assist patient in returning to PLOF    Time 6   Period Weeks   Status Achieved   PT LONG TERM GOAL #3   Title Patient will be able to ambulate over uneven surfaces with pain no more than 1/10 and no loss of balance in order to assist her in being able to work in her yard and ambulate outdoors    Time 6   Period Weeks   Status On-going   PT LONG TERM GOAL #4   Title Patient to score at least 52 on BERG balance test in order to demonstrate improved balance skills and in general overall reduced fall risk    Time 6   Period Weeks   Status Achieved               Plan - 07/19/15 1531    Clinical Impression Statement COntinued to focus on ankle mobilty and strength this afternoon with introduction of heel and toe walking and single leg 3 way toe taps today; patient did bring a copy of MD"s extended prescription as well as a copy of the imaging report done at her last viisit. No  increased pain throughout session.    Pt will benefit from skilled therapeutic intervention in order to improve on the  following deficits Abnormal gait;Decreased range of motion;Difficulty walking;Increased muscle spasms;Decreased activity tolerance;Hypomobility;Pain;Impaired flexibility;Decreased mobility;Decreased strength;Increased edema   Rehab Potential Good   PT Frequency 2x / week   PT Duration 4 weeks   PT Treatment/Interventions ADLs/Self Care Home Management;Cryotherapy;Gait training;Stair training;Functional mobility training;Therapeutic activities;Therapeutic exercise;Balance training;Neuromuscular re-education;Patient/family education;Manual techniques   PT Next Visit Plan Progress with L ankle CKC stabilization ther ex and cross friction massage to L peroneal muscles.    PT Home Exercise Plan Reviewed current HEP with no changes made this visit    Consulted and Agree with Plan of Care Patient        Problem List Patient Active Problem List   Diagnosis Date Noted  . GERD (gastroesophageal reflux disease) 06/27/2013  . H/O: upper GI bleed 06/27/2013  . Epigastric pain 06/27/2013  . Tachycardia-bradycardia [facility]) 11/21/2010    Deniece Ree PT, DPT [phone]  [place] [address] [place], [place], [address] Phone: [phone]   Fax:  [phone]  Name: [name] MRN: [id number] Date of Birth: [birth year]

## 2015-07-24 ENCOUNTER — Ambulatory Visit (HOSPITAL_COMMUNITY): Payer: Commercial Managed Care - HMO | Admitting: Physical Therapy

## 2015-07-24 DIAGNOSIS — R262 Difficulty in walking, not elsewhere classified: Secondary | ICD-10-CM | POA: Diagnosis not present

## 2015-07-24 DIAGNOSIS — M25672 Stiffness of left ankle, not elsewhere classified: Secondary | ICD-10-CM | POA: Diagnosis not present

## 2015-07-24 DIAGNOSIS — M25572 Pain in left ankle and joints of left foot: Secondary | ICD-10-CM | POA: Diagnosis not present

## 2015-07-24 DIAGNOSIS — R2681 Unsteadiness on feet: Secondary | ICD-10-CM | POA: Diagnosis not present

## 2015-07-24 DIAGNOSIS — R600 Localized edema: Secondary | ICD-10-CM | POA: Diagnosis not present

## 2015-07-24 NOTE — Therapy (Signed)
[place], [place], [address] Phone: [phone]   Fax:  [phone]  Physical Therapy Treatment  Patient Details  Name: [name] MRN: [id number] Date of Birth: 11/30/1945 Referring Provider: Verda Cumins MD   Encounter Date: 07/24/2015      PT End of Session - 07/24/15 1521    Visit Number 11   Number of Visits 12   Date for PT Re-Evaluation 08/07/15   Authorization Type [employer] Rm Griselda Miner done 8th session)   Authorization Time Period 06/11/2015 to 08/09/2015   Authorization - Visit Number 11   Authorization - Number of Visits 18   PT Start Time [id number]   PT Stop Time 1515   PT Time Calculation (min) 42 min   Activity Tolerance Patient tolerated treatment well   Behavior During Therapy [facility] for tasks assessed/performed      Past Medical History  Diagnosis Date  . Pulmonary embolism ([place])   . Tachycardia   . Hemoptysis   . Iron deficiency   . GERD (gastroesophageal reflux disease)   . Diverticulitis   . COPD (chronic obstructive pulmonary disease) ([place])   . Hyperlipidemia   . Breast cancer [facility])     left breast/lumpectomy/chemo/rad 2003    Past Surgical History  Procedure Laterality Date  . Breast lumpectomy    . Back surgery    . Colonoscopy  01/28/2012    Procedure: COLONOSCOPY;  Surgeon: Rogene Houston, MD;  [facility];  Service: Endoscopy;  Laterality: N/A;  1200  . Colonoscopy  01-2012  . Tonsillectomy      Patient was age 8  . Laparoscopic tubal ligation  1970  . Esophagogastroduodenoscopy N/A 07/01/2013    Procedure: ESOPHAGOGASTRODUODENOSCOPY (EGD);  Surgeon: Rogene Houston, MD;  [facility];  Service: Endoscopy;  Laterality: N/A;  1:10    There were no vitals filed for this visit.  Visit Diagnosis:  Left ankle pain  Local edema  Ankle stiffness, left  Difficulty walking  Unsteadiness      Subjective Assessment - 07/24/15 1435    Subjective Patient reports she is doing OK today; states that she feels she just might be feeling symptoms from this the rest of her life    Pertinent History Tachycardia   Currently in Pain? No/denies  just discomfort, not true pain                          [facility] Adult PT Treatment/Exercise - 07/24/15 0001    Ankle Exercises: Stretches   Plantar Fascia Stretch 3 reps;30 seconds   Slant Board Stretch 3 reps;30 seconds   Other Stretch hamstring stretch 3x30 seconds    Ankle Exercises: Seated   ABC's 3 reps   Other Seated Ankle Exercises DF/inversion with green TB 1x15; everision with red TB 1x15    Ankle Exercises: Standing   Rocker Board 2 minutes   Rocker Board Limitations AP and latearl    Heel Walk (Round Trip) 2RT    Toe Walk (Round Trip) 2RT    Other Standing Ankle Exercises 3 way toe taps with SLS on L foot; weight shifts onto green airex pad with min guard ; marching on trampoline with fingertip pressure;                 PT Education - 07/24/15 1521    Education provided No  PT Short Term Goals - 07/10/15 1550    PT SHORT TERM GOAL #1   Title Patient will be independent in self-care management techniques for edema and pain, including but not limited to elevation, retromassage, and appropriate stretches/exercises in order to enhance safe self management of condition    Time 3   Period Weeks   Status On-going   PT SHORT TERM GOAL #2   Title Patient will improve bilateral ankle dorsiflexion to at least 10 degrees in order to improve overall mobility and improve balance reaction skills    Time 3   Period Weeks   Status On-going   PT SHORT TERM GOAL #3   Title Patient to be able to perform inversion/eversion with L ankle to at least 15 degrees eversion and at least 25 degrees inversion with pain 0/10 in order to improve mechanics and general ankle function    Time 3   Period Weeks   Status On-going   PT SHORT TERM GOAL #4   Title  Patient to be independent in correctly and consistently performing appropriate HEP, to be updated PRN    Time 3   Period Weeks   Status On-going   PT SHORT TERM GOAL #5   Title Patient will demonstrate no more than 0.5cm difference with circumferential measures between both ankles in order to reduce edema and pain, improve ROM    Time 3   Period Weeks   Status On-going           PT Long Term Goals - 07/10/15 1550    PT LONG TERM GOAL #1   Title Patient will demonstrate strength at least 4+/5 in all ankle planes with pain 0/10 in order to improve overall mobility and promote overall ankle stability    Time 6   Period Weeks   Status On-going   PT LONG TERM GOAL #2   Title Patient will be able to ambulate unlimited distances outside of boot, after MD has cleared patient to do so, with pain no more than 1/10 in order to assist patient in returning to PLOF    Time 6   Period Weeks   Status Achieved   PT LONG TERM GOAL #3   Title Patient will be able to ambulate over uneven surfaces with pain no more than 1/10 and no loss of balance in order to assist her in being able to work in her yard and ambulate outdoors    Time 6   Period Weeks   Status On-going   PT LONG TERM GOAL #4   Title Patient to score at least 52 on BERG balance test in order to demonstrate improved balance skills and in general overall reduced fall risk    Time 6   Period Weeks   Status Achieved               Plan - 07/24/15 1511    Clinical Impression Statement Focused on ankle stability and strength today with brief touch on mobilty exercise; patient continues to demontrate difficulty with single leg stance based tasks esepcially on unstable surfaces such as trampoline. Continues to experience discomfort in ankle but not pain with exercises. Suspect that patient may also have some abductor weakness due to difficulty with these tasks, which may be exacerbating difficulty maintaining balance in SLS.    Pt  will benefit from skilled therapeutic intervention in order to improve on the following deficits Abnormal gait;Decreased range of motion;Difficulty walking;Increased muscle spasms;Decreased activity tolerance;Hypomobility;Pain;Impaired flexibility;Decreased mobility;Decreased strength;Increased  edema   Rehab Potential Good   PT Frequency 2x / week   PT Duration 4 weeks   PT Treatment/Interventions ADLs/Self Care Home Management;Cryotherapy;Gait training;Stair training;Functional mobility training;Therapeutic activities;Therapeutic exercise;Balance training;Neuromuscular re-education;Patient/family education;Manual techniques   PT Next Visit Plan Progress with L ankle CKC stabilization ther ex and cross friction massage to L peroneal muscles.    PT Home Exercise Plan Reviewed current HEP with no changes made this visit    Consulted and Agree with Plan of Care Patient        Problem List Patient Active Problem List   Diagnosis Date Noted  . GERD (gastroesophageal reflux disease) 06/27/2013  . H/O: upper GI bleed 06/27/2013  . Epigastric pain 06/27/2013  . 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 [place], [place], [address] Phone: [phone]   Fax:  [phone]  Name: [name] MRN: [id number] Date of Birth: 1946/05/10

## 2015-07-26 ENCOUNTER — Ambulatory Visit (HOSPITAL_COMMUNITY): Payer: Commercial Managed Care - HMO | Admitting: Physical Therapy

## 2015-07-26 DIAGNOSIS — R2681 Unsteadiness on feet: Secondary | ICD-10-CM

## 2015-07-26 DIAGNOSIS — R600 Localized edema: Secondary | ICD-10-CM | POA: Diagnosis not present

## 2015-07-26 DIAGNOSIS — R262 Difficulty in walking, not elsewhere classified: Secondary | ICD-10-CM | POA: Diagnosis not present

## 2015-07-26 DIAGNOSIS — M25572 Pain in left ankle and joints of left foot: Secondary | ICD-10-CM | POA: Diagnosis not present

## 2015-07-26 DIAGNOSIS — M25672 Stiffness of left ankle, not elsewhere classified: Secondary | ICD-10-CM | POA: Diagnosis not present

## 2015-07-26 NOTE — Therapy (Signed)
[place], [place], [address] Phone: [phone]   Fax:  [phone]  Physical Therapy Treatment  Patient Details  Name: [name] MRN: [id number] Date of Birth: 05-25-1946 Referring Provider: Verda Cumins MD   Encounter Date: 07/26/2015      PT End of Session - 07/26/15 1536    Visit Number 13   Number of Visits 16   Date for PT Re-Evaluation 08/07/15   Authorization Type [employer] Medicare/[employer] Rm Griselda Miner done 9th session)   Authorization Time Period 06/11/2015 to 08/09/2015   Authorization - Visit Number 13   Authorization - Number of Visits 18   PT Start Time [id number]   PT Stop Time 1522   PT Time Calculation (min) 47 min   Activity Tolerance Patient tolerated treatment well   Behavior During Therapy [facility] for tasks assessed/performed      Past Medical History  Diagnosis Date  . Pulmonary embolism ([place])   . Tachycardia   . Hemoptysis   . Iron deficiency   . GERD (gastroesophageal reflux disease)   . Diverticulitis   . COPD (chronic obstructive pulmonary disease) ([place])   . Hyperlipidemia   . Breast cancer [facility])     left breast/lumpectomy/chemo/rad 2003    Past Surgical History  Procedure Laterality Date  . Breast lumpectomy    . Back surgery    . Colonoscopy  01/28/2012    Procedure: COLONOSCOPY;  Surgeon: Rogene Houston, MD;  [facility];  Service: Endoscopy;  Laterality: N/A;  1200  . Colonoscopy  01-2012  . Tonsillectomy      Patient was age 13  . Laparoscopic tubal ligation  1970  . Esophagogastroduodenoscopy N/A 07/01/2013    Procedure: ESOPHAGOGASTRODUODENOSCOPY (EGD);  Surgeon: Rogene Houston, MD;  [facility];  Service: Endoscopy;  Laterality: N/A;  1:10    There were no vitals filed for this visit.  Visit Diagnosis:  Left ankle pain  Local edema  Ankle stiffness, left  Difficulty walking  Unsteadiness      Subjective Assessment - 07/26/15 1541    Subjective PT states she can tell she's getting better.  Still aches from time to time and gets tired but improving.   Currently in Pain? No/denies                         [facility] Adult PT Treatment/Exercise - 07/26/15 0001    Ankle Exercises: Stretches   Plantar Fascia Stretch 3 reps;30 seconds   Gastroc Stretch 3 reps;30 seconds   Slant Board Stretch 3 reps;30 seconds   Other Stretch hamstring stretch 3x30 seconds    Ankle Exercises: Standing   BAPS Standing;Level 2;Limitations  Lt only with B UE support   Vector Stance Left;5 seconds  10 reps with 1 HHA   SLS Lt LE 3 trials no HHA 15" max   Heel Walk (Round Trip) 2RT    Toe Walk (Round Trip) 2RT    Ankle Exercises: Supine   T-Band L ankle 4 way with Red TB 2x10, gravity present                   PT Short Term Goals - 07/10/15 1550    PT SHORT TERM GOAL #1   Title Patient will be independent in self-care management techniques for edema and pain, including but not limited to elevation, retromassage, and appropriate stretches/exercises in order to enhance safe self management  of condition    Time 3   Period Weeks   Status On-going   PT SHORT TERM GOAL #2   Title Patient will improve bilateral ankle dorsiflexion to at least 10 degrees in order to improve overall mobility and improve balance reaction skills    Time 3   Period Weeks   Status On-going   PT SHORT TERM GOAL #3   Title Patient to be able to perform inversion/eversion with L ankle to at least 15 degrees eversion and at least 25 degrees inversion with pain 0/10 in order to improve mechanics and general ankle function    Time 3   Period Weeks   Status On-going   PT SHORT TERM GOAL #4   Title Patient to be independent in correctly and consistently performing appropriate HEP, to be updated PRN    Time 3   Period Weeks   Status On-going   PT SHORT TERM GOAL #5   Title Patient will demonstrate no more than 0.5cm difference with circumferential  measures between both ankles in order to reduce edema and pain, improve ROM    Time 3   Period Weeks   Status On-going           PT Long Term Goals - 07/10/15 1550    PT LONG TERM GOAL #1   Title Patient will demonstrate strength at least 4+/5 in all ankle planes with pain 0/10 in order to improve overall mobility and promote overall ankle stability    Time 6   Period Weeks   Status On-going   PT LONG TERM GOAL #2   Title Patient will be able to ambulate unlimited distances outside of boot, after MD has cleared patient to do so, with pain no more than 1/10 in order to assist patient in returning to PLOF    Time 6   Period Weeks   Status Achieved   PT LONG TERM GOAL #3   Title Patient will be able to ambulate over uneven surfaces with pain no more than 1/10 and no loss of balance in order to assist her in being able to work in her yard and ambulate outdoors    Time 6   Period Weeks   Status On-going   PT LONG TERM GOAL #4   Title Patient to score at least 52 on BERG balance test in order to demonstrate improved balance skills and in general overall reduced fall risk    Time 6   Period Weeks   Status Achieved               Plan - 07/26/15 1537    Clinical Impression Statement Continued focus on improving ankle stability.  Overall improvement voiced per patient, still with fatigue and swelling at times but continues to wear compression garment to help with this as well as take breaks as needed.  Progressed with standing vectors and BAPS actvitiy with short break between each movement.  Pt with most weakness with inversion as exhibitied with theraband exercises.     Pt will benefit from skilled therapeutic intervention in order to improve on the following deficits Abnormal gait;Decreased range of motion;Difficulty walking;Increased muscle spasms;Decreased activity tolerance;Hypomobility;Pain;Impaired flexibility;Decreased mobility;Decreased strength;Increased edema   Rehab  Potential Good   PT Frequency 2x / week   PT Duration 4 weeks   PT Treatment/Interventions ADLs/Self Care Home Management;Cryotherapy;Gait training;Stair training;Functional mobility training;Therapeutic activities;Therapeutic exercise;Balance training;Neuromuscular re-education;Patient/family education;Manual techniques   PT Next Visit Plan Progress with L ankle CKC stabilization ther  ex and cross friction massage to L peroneal muscles if needed.  Re-evaluate X 3 more visits.    Consulted and Agree with Plan of Care Patient        Problem List Patient Active Problem List   Diagnosis Date Noted  . GERD (gastroesophageal reflux disease) 06/27/2013  . H/O: upper GI bleed 06/27/2013  . Epigastric pain 06/27/2013  . 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

## 2015-07-31 ENCOUNTER — Ambulatory Visit (HOSPITAL_COMMUNITY): Payer: Commercial Managed Care - HMO | Admitting: Physical Therapy

## 2015-07-31 DIAGNOSIS — R262 Difficulty in walking, not elsewhere classified: Secondary | ICD-10-CM

## 2015-07-31 DIAGNOSIS — R2681 Unsteadiness on feet: Secondary | ICD-10-CM

## 2015-07-31 DIAGNOSIS — M25672 Stiffness of left ankle, not elsewhere classified: Secondary | ICD-10-CM | POA: Diagnosis not present

## 2015-07-31 DIAGNOSIS — R600 Localized edema: Secondary | ICD-10-CM | POA: Diagnosis not present

## 2015-07-31 DIAGNOSIS — M25572 Pain in left ankle and joints of left foot: Secondary | ICD-10-CM

## 2015-07-31 NOTE — Therapy (Signed)
[place], [place], [address] Phone: [phone]   Fax:  [phone]  Physical Therapy Treatment  Patient Details  Name: [name] MRN: [id number] Date of Birth: 1946-01-28 Referring Provider: Verda Cumins MD   Encounter Date: 07/31/2015      PT End of Session - 07/31/15 1823    Visit Number 14   Number of Visits 16   Date for PT Re-Evaluation 08/07/15   Authorization Type [employer] Rm Griselda Miner done 9th session)   Authorization Time Period 06/11/2015 to 08/09/2015   Authorization - Visit Number 14   Authorization - Number of Visits 18   PT Start Time [id number]   PT Stop Time 1428   PT Time Calculation (min) 40 min   Activity Tolerance Patient tolerated treatment well   Behavior During Therapy [facility] for tasks assessed/performed      Past Medical History  Diagnosis Date  . Pulmonary embolism ([place])   . Tachycardia   . Hemoptysis   . Iron deficiency   . GERD (gastroesophageal reflux disease)   . Diverticulitis   . COPD (chronic obstructive pulmonary disease) ([place])   . Hyperlipidemia   . Breast cancer [facility])     left breast/lumpectomy/chemo/rad 2003    Past Surgical History  Procedure Laterality Date  . Breast lumpectomy    . Back surgery    . Colonoscopy  01/28/2012    Procedure: COLONOSCOPY;  Surgeon: Rogene Houston, MD;  [facility];  Service: Endoscopy;  Laterality: N/A;  1200  . Colonoscopy  01-2012  . Tonsillectomy      Patient was age 70  . Laparoscopic tubal ligation  1970  . Esophagogastroduodenoscopy N/A 07/01/2013    Procedure: ESOPHAGOGASTRODUODENOSCOPY (EGD);  Surgeon: Rogene Houston, MD;  Location[facility];  Service: Endoscopy;  Laterality: N/A;  1:10    There were no vitals filed for this visit.  Visit Diagnosis:  Left ankle pain  Local edema  Ankle stiffness, left  Difficulty walking  Unsteadiness      Subjective Assessment - 07/31/15 1351    Subjective Patient reports she had quite a bit of hip soreness after last session ,reports it was mostly muscle soreness, otherwise doing well.    Pertinent History Tachycardia   Currently in Pain? No/denies                         [facility] Adult PT Treatment/Exercise - 07/31/15 0001    Ankle Exercises: Stretches   Plantar Fascia Stretch 3 reps;30 seconds   Gastroc Stretch 3 reps;30 seconds   Slant Board Stretch 3 reps;30 seconds   Other Stretch hamstring stretch 3x30 seconds    Ankle Exercises: Standing   Rocker Board 2 minutes   Rocker Board Limitations AP and latearl, 2 fingers    Heel Walk (Round Trip) 3RT    Toe Walk (Round Trip) 3RT    Other Standing Ankle Exercises toe and heel raises, no HHA, cues for form; lunges onto foam pad (attempted side lunges but unable due to knees); vector stances 1x10 each side, 3 second holds    Ankle Exercises: Seated   Ankle Circles/Pumps AROM;20 reps                PT Education - 07/31/15 1822    Education provided No          PT Short Term Goals - 07/10/15 1550    PT  SHORT TERM GOAL #1   Title Patient will be independent in self-care management techniques for edema and pain, including but not limited to elevation, retromassage, and appropriate stretches/exercises in order to enhance safe self management of condition    Time 3   Period Weeks   Status On-going   PT SHORT TERM GOAL #2   Title Patient will improve bilateral ankle dorsiflexion to at least 10 degrees in order to improve overall mobility and improve balance reaction skills    Time 3   Period Weeks   Status On-going   PT SHORT TERM GOAL #3   Title Patient to be able to perform inversion/eversion with L ankle to at least 15 degrees eversion and at least 25 degrees inversion with pain 0/10 in order to improve mechanics and general ankle function    Time 3   Period Weeks   Status On-going   PT SHORT TERM GOAL #4   Title Patient to be independent in  correctly and consistently performing appropriate HEP, to be updated PRN    Time 3   Period Weeks   Status On-going   PT SHORT TERM GOAL #5   Title Patient will demonstrate no more than 0.5cm difference with circumferential measures between both ankles in order to reduce edema and pain, improve ROM    Time 3   Period Weeks   Status On-going           PT Long Term Goals - 07/10/15 1550    PT LONG TERM GOAL #1   Title Patient will demonstrate strength at least 4+/5 in all ankle planes with pain 0/10 in order to improve overall mobility and promote overall ankle stability    Time 6   Period Weeks   Status On-going   PT LONG TERM GOAL #2   Title Patient will be able to ambulate unlimited distances outside of boot, after MD has cleared patient to do so, with pain no more than 1/10 in order to assist patient in returning to PLOF    Time 6   Period Weeks   Status Achieved   PT LONG TERM GOAL #3   Title Patient will be able to ambulate over uneven surfaces with pain no more than 1/10 and no loss of balance in order to assist her in being able to work in her yard and ambulate outdoors    Time 6   Period Weeks   Status On-going   PT LONG TERM GOAL #4   Title Patient to score at least 52 on BERG balance test in order to demonstrate improved balance skills and in general overall reduced fall risk    Time 6   Period Weeks   Status Achieved               Plan - 07/31/15 1823    Clinical Impression Statement Continued ankle stability training today; attempted lunges onto unsteady surfaces but this was limited today due to knee pain, pre-existing. Patient continues to state that she feels she is improving but is questioning just how much better she is going to get at this point. Contnued vector stances and ended patient session with gentle ROM exercises today.    Pt will benefit from skilled therapeutic intervention in order to improve on the following deficits Abnormal gait;Decreased  range of motion;Difficulty walking;Increased muscle spasms;Decreased activity tolerance;Hypomobility;Pain;Impaired flexibility;Decreased mobility;Decreased strength;Increased edema   Rehab Potential Good   PT Frequency 2x / week   PT Duration 4 weeks  PT Treatment/Interventions ADLs/Self Care Home Management;Cryotherapy;Gait training;Stair training;Functional mobility training;Therapeutic activities;Therapeutic exercise;Balance training;Neuromuscular re-education;Patient/family education;Manual techniques   PT Next Visit Plan Progress with L ankle CKC stabilization ther ex and cross friction massage to L peroneal muscles if needed.  Re-evaluate X 2 more visits.    PT Home Exercise Plan Reviewed current HEP with no changes made this visit    Consulted and Agree with Plan of Care Patient        Problem List Patient Active Problem List   Diagnosis Date Noted  . GERD (gastroesophageal reflux disease) 06/27/2013  . H/O: upper GI bleed 06/27/2013  . Epigastric pain 06/27/2013  . 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

## 2015-08-02 ENCOUNTER — Ambulatory Visit (HOSPITAL_COMMUNITY): Payer: Commercial Managed Care - HMO | Attending: Sports Medicine

## 2015-08-02 DIAGNOSIS — R262 Difficulty in walking, not elsewhere classified: Secondary | ICD-10-CM | POA: Diagnosis not present

## 2015-08-02 DIAGNOSIS — M25572 Pain in left ankle and joints of left foot: Secondary | ICD-10-CM | POA: Diagnosis not present

## 2015-08-02 DIAGNOSIS — R2681 Unsteadiness on feet: Secondary | ICD-10-CM | POA: Diagnosis not present

## 2015-08-02 DIAGNOSIS — R600 Localized edema: Secondary | ICD-10-CM | POA: Insufficient documentation

## 2015-08-02 DIAGNOSIS — M25672 Stiffness of left ankle, not elsewhere classified: Secondary | ICD-10-CM | POA: Diagnosis not present

## 2015-08-02 NOTE — Patient Instructions (Signed)
Calf Stretch    Place hands on wall at shoulder height. Keeping back leg straight, bend front leg, feet pointing forward, heels flat on floor. Lean forward slightly until stretch is felt in calf of back leg. Hold stretch 30 seconds, breathing slowly in and out. Repeat stretch with other leg back. Do 3 sessions per day. Variation: Use chair or table for support.  Copyright  VHI. All rights reserved.   Achilles / Soleus, Standing    Stand, right foot behind, heel on floor and turned slightly out. Lower hips and bend knees. Hold 30 seconds. Repeat 3 times per session. Do 1-2 sessions per day.  Copyright  VHI. All rights reserved.   Plantar Fascia Stretch    Standing with only ball of left foot on stair, push heel down until stretch is felt through arch of foot. Hold 30 seconds. Relax. Repeat 3 times per set.   http://orth.exer.us/23   Copyright  VHI. All rights reserved.

## 2015-08-02 NOTE — Therapy (Signed)
[place], [place], [address] Phone: [phone]   Fax:  [phone]  Physical Therapy Treatment  Patient Details  Name: [name] MRN: [id number] Date of Birth: [birth year] Referring Provider: Verda Cumins MD   Encounter Date: 08/02/2015      PT End of Session - 08/02/15 1820    Visit Number 15   Number of Visits 16   Date for PT Re-Evaluation 08/07/15   Authorization Type [employer] Rm Griselda Miner done 9th session)   Authorization Time Period 06/11/2015 to 08/09/2015   Authorization - Visit Number 15   Authorization - Number of Visits 18   PT Start Time [id number]   PT Stop Time 1820   PT Time Calculation (min) 48 min   Activity Tolerance Patient tolerated treatment well   Behavior During Therapy [facility] for tasks assessed/performed      Past Medical History  Diagnosis Date  . Pulmonary embolism ([place])   . Tachycardia   . Hemoptysis   . Iron deficiency   . GERD (gastroesophageal reflux disease)   . Diverticulitis   . COPD (chronic obstructive pulmonary disease) ([place])   . Hyperlipidemia   . Breast cancer [facility])     left breast/lumpectomy/chemo/rad 2003    Past Surgical History  Procedure Laterality Date  . Breast lumpectomy    . Back surgery    . Colonoscopy  01/28/2012    Procedure: COLONOSCOPY;  Surgeon: Rogene Houston, MD;  [facility];  Service: Endoscopy;  Laterality: N/A;  1200  . Colonoscopy  01-2012  . Tonsillectomy      Patient was age 70  . Laparoscopic tubal ligation  1970  . Esophagogastroduodenoscopy N/A 07/01/2013    Procedure: ESOPHAGOGASTRODUODENOSCOPY (EGD);  Surgeon: Rogene Houston, MD;  [facility];  Service: Endoscopy;  Laterality: N/A;  1:10    There were no vitals filed for this visit.  Visit Diagnosis:  Left ankle pain  Local edema  Ankle stiffness, left  Difficulty walking  Unsteadiness      Subjective Assessment - 08/02/15 1734    Subjective Pt reports she is feeling good today, no reports of pain today.   Pertinent History Tachycardia   Patient Stated Goals Pt's goals include decreasing her L foot/ankle edema, improve her walking distance, and improve her balance.    Currently in Pain? No/denies              [facility] Adult PT Treatment/Exercise - 08/02/15 0001    Ankle Exercises: Standing   Vector Stance Right;Left;5 reps;5 seconds  intermittent   SLS Lt 60", Rt 33" max of 3   Rocker Board 2 minutes   Rocker Board Limitations AP and latearl, 2 fingers    Heel Raises 20 reps   Toe Raise 20 reps   Heel Walk (Round Trip) 3RT    Toe Walk (Round Trip) 3RT    Other Standing Ankle Exercises lunges onto BOSU 15x each forward and lateral 10x , vector stancev 5x5"   Ankle Exercises: Stretches   Plantar Fascia Stretch 3 reps;30 seconds   Soleus Stretch 3 reps;30 seconds   Gastroc Stretch 3 reps;30 seconds  standard against wall, HEP given           PT Short Term Goals - 07/10/15 1550    PT SHORT TERM GOAL #1   Title Patient will be independent in self-care management techniques for edema and pain, including but not limited to elevation, retromassage,  and appropriate stretches/exercises in order to enhance safe self management of condition    Time 3   Period Weeks   Status On-going   PT SHORT TERM GOAL #2   Title Patient will improve bilateral ankle dorsiflexion to at least 10 degrees in order to improve overall mobility and improve balance reaction skills    Time 3   Period Weeks   Status On-going   PT SHORT TERM GOAL #3   Title Patient to be able to perform inversion/eversion with L ankle to at least 15 degrees eversion and at least 25 degrees inversion with pain 0/10 in order to improve mechanics and general ankle function    Time 3   Period Weeks   Status On-going   PT SHORT TERM GOAL #4   Title Patient to be independent in correctly and consistently performing appropriate HEP, to be updated PRN     Time 3   Period Weeks   Status On-going   PT SHORT TERM GOAL #5   Title Patient will demonstrate no more than 0.5cm difference with circumferential measures between both ankles in order to reduce edema and pain, improve ROM    Time 3   Period Weeks   Status On-going           PT Long Term Goals - 07/10/15 1550    PT LONG TERM GOAL #1   Title Patient will demonstrate strength at least 4+/5 in all ankle planes with pain 0/10 in order to improve overall mobility and promote overall ankle stability    Time 6   Period Weeks   Status On-going   PT LONG TERM GOAL #2   Title Patient will be able to ambulate unlimited distances outside of boot, after MD has cleared patient to do so, with pain no more than 1/10 in order to assist patient in returning to PLOF    Time 6   Period Weeks   Status Achieved   PT LONG TERM GOAL #3   Title Patient will be able to ambulate over uneven surfaces with pain no more than 1/10 and no loss of balance in order to assist her in being able to work in her yard and ambulate outdoors    Time 6   Period Weeks   Status On-going   PT LONG TERM GOAL #4   Title Patient to score at least 52 on BERG balance test in order to demonstrate improved balance skills and in general overall reduced fall risk    Time 6   Period Weeks   Status Achieved               Plan - 08/02/15 1826    Clinical Impression Statement Session focus on improving ankle mobiltiy initially to improve gait mechanics and progress to functional strenghtening for stabiltiy.  Pt given HEP printout for gastroc, soleus and plantar fascia stretches to add at home for mobiltiy.  Pt able to demontrate appropraite form with all exercises with min cueing for form wtih lateral lunges this session to prevent knee pain, lunges compelte on BOSU for dynamic surface to improve ankle stabiltiy.  Pt improving balance with SLS actiivities and form with vector stance this session with intermittent HHA required  only.     Pt will benefit from skilled therapeutic intervention in order to improve on the following deficits Abnormal gait;Decreased range of motion;Difficulty walking;Increased muscle spasms;Decreased activity tolerance;Hypomobility;Pain;Impaired flexibility;Decreased mobility;Decreased strength;Increased edema   PT Frequency 2x / week   PT Duration  4 weeks   PT Treatment/Interventions ADLs/Self Care Home Management;Cryotherapy;Gait training;Stair training;Functional mobility training;Therapeutic activities;Therapeutic exercise;Balance training;Neuromuscular re-education;Patient/family education;Manual techniques   PT Next Visit Plan Reassess next session.  Give pt HEP for functional strenghtening next session.     PT Home Exercise Plan Given printout for stretches HEP        Problem List Patient Active Problem List   Diagnosis Date Noted  . GERD (gastroesophageal reflux disease) 06/27/2013  . H/O: upper GI bleed 06/27/2013  . Epigastric pain 06/27/2013  . Tachycardia-bradycardia [facility]) 11/21/2010   Ihor Austin, LPTA; [place]  Aldona Lento 08/02/2015, 6:31 PM  [place] [address] [place], [place], [address] Phone: [phone]   Fax:  [phone]  Name: ORLETTA SOLIMINE MRN: [id number] Date of Birth: 05-24-1946

## 2015-08-07 ENCOUNTER — Ambulatory Visit (HOSPITAL_COMMUNITY): Payer: Commercial Managed Care - HMO

## 2015-08-07 DIAGNOSIS — R262 Difficulty in walking, not elsewhere classified: Secondary | ICD-10-CM | POA: Diagnosis not present

## 2015-08-07 DIAGNOSIS — M25672 Stiffness of left ankle, not elsewhere classified: Secondary | ICD-10-CM

## 2015-08-07 DIAGNOSIS — R2681 Unsteadiness on feet: Secondary | ICD-10-CM | POA: Diagnosis not present

## 2015-08-07 DIAGNOSIS — R600 Localized edema: Secondary | ICD-10-CM | POA: Diagnosis not present

## 2015-08-07 DIAGNOSIS — M25572 Pain in left ankle and joints of left foot: Secondary | ICD-10-CM

## 2015-08-07 NOTE — Patient Instructions (Addendum)
Heel Raises  Toe / Heel Raise (Standing)    Standing with support, raise heels, then rock back on heels and raise toes. Repeat 20 times.  Copyright  VHI. All rights reserved.   Single Leg Balance: Eyes Open    Stand on right leg with eyes open. Hold up to 6 seconds. 3 reps 1-2 times per day.  http://ggbe.exer.us/5   Copyright  VHI. All rights reserved.   Tandem Stance    Right foot in front of left, heel touching toe both feet "straight ahead". Stand on Foot Triangle of Support with both feet. Balance in this position 30  seconds. Do with left foot in front of right.  Copyright  VHI. All rights reserved.   Walking on Heels    Walk on heels for ___ feet while continuing on a straight path. Do 1-2 sessions per day.  Copyright  VHI. All rights reserved.   Walking on Toes    Walk on toes for ____ feet while continuing on a straight path.  Do 1-2 sessions per day.  Copyright  VHI. All rights reserved.

## 2015-08-08 NOTE — Therapy (Signed)
[place], [place], [address] Phone: [phone]   Fax:  [phone]  Physical Therapy Treatment  Patient Details  Name: [name] MRN: [id number] Date of Birth: July 12, 1945 Referring Provider: Verda Cumins  Encounter Date: 08/07/2015      PT End of Session - 08/07/15 1604    Visit Number 16   Number of Visits 16   Date for PT Re-Evaluation 08/07/15   Authorization Type [employer] [place] Rm Griselda Miner done 9th session)   Authorization Time Period 06/11/2015 to 08/09/2015   Authorization - Visit Number 16   Authorization - Number of Visits 16   PT Start Time 3299   PT Stop Time 1601   PT Time Calculation (min) 46 min   Activity Tolerance Patient tolerated treatment well   Behavior During Therapy [facility] for tasks assessed/performed      Past Medical History  Diagnosis Date  . Pulmonary embolism ([place])   . Tachycardia   . Hemoptysis   . Iron deficiency   . GERD (gastroesophageal reflux disease)   . Diverticulitis   . COPD (chronic obstructive pulmonary disease) ([place])   . Hyperlipidemia   . Breast cancer [facility])     left breast/lumpectomy/chemo/rad 2003    Past Surgical History  Procedure Laterality Date  . Breast lumpectomy    . Back surgery    . Colonoscopy  01/28/2012    Procedure: COLONOSCOPY;  Surgeon: Rogene Houston, MD;  [facility];  Service: Endoscopy;  Laterality: N/A;  1200  . Colonoscopy  01-2012  . Tonsillectomy      Patient was age 70  . Laparoscopic tubal ligation  1970  . Esophagogastroduodenoscopy N/A 07/01/2013    Procedure: ESOPHAGOGASTRODUODENOSCOPY (EGD);  Surgeon: Rogene Houston, MD;  [facility];  Service: Endoscopy;  Laterality: N/A;  1:10    There were no vitals filed for this visit.  Visit Diagnosis:  Local edema  Left ankle pain  Ankle stiffness, left  Difficulty walking  Unsteadiness      Subjective Assessment - 08/07/15 1519    Subjective Pt reports some lingering discomfort today, but notes that it doesn't keep her from doing anything.     Pertinent History Tachycardia   Limitations Walking;Standing   How long can you sit comfortably? 3/7   unlimited;   2/7- unlimited    How long can you stand comfortably? 3/7 unlimited;  2/7- 60 minutes    How long can you walk comfortably? 3/7 no limits;  2/7- no limits    Patient Stated Goals Pt's goals include decreasing her L foot/ankle edema, improve her walking distance, and improve her balance.    Currently in Pain? Yes   Pain Score 1    Pain Location Ankle   Pain Orientation Left   Pain Descriptors / Indicators Other (Comment)  sting   Pain Type Chronic pain   Pain Radiating Towards pain in ankle across top of foot   Pain Onset More than a month ago   Pain Frequency Intermittent   Aggravating Factors  not sure what increases pain   Pain Relieving Factors always there   Effect of Pain on Daily Activities doesn't stop her from doing anything    Multiple Pain Sites No            [facility] PT Assessment - 08/08/15 0001    Assessment   Medical Diagnosis L ankle pain/swelling    Referring Provider Verda Cumins  Next MD Visit Dr. Zachery Dauer March 31st   Balance Screen   Has the patient fallen in the past 6 months No   Has the patient had a decrease in activity level because of a fear of falling?  No   Is the patient reluctant to leave their home because of a fear of falling?  No   Observation/Other Assessments   Observations .5cm difference between Rt and Lt circumferential measurement   Focus on Therapeutic Outcomes (FOTO)  17% limited was 52% initial eval   AROM   Right Ankle Dorsiflexion --  was 9   Right Ankle Plantar Flexion --  was 60   Right Ankle Inversion --  was 29   Right Ankle Eversion --  was 15   Left Ankle Dorsiflexion 11  was 11   Left Ankle Plantar Flexion 50  was 50   Left Ankle Inversion 25  was 15   Left Ankle Eversion 20  was 18    Strength   Right Ankle Dorsiflexion 5/5   Right Ankle Inversion 5/5   Right Ankle Eversion 5/5   Left Ankle Dorsiflexion 5/5  was 4-/5   Left Ankle Inversion 4+/5  was 4-/5   Left Ankle Eversion 4+/5  was 4-/5   6 minute walk test results    Aerobic Endurance Distance Walked 1376   Endurance additional comments   Standardized Balance Assessment   Standardized Balance Assessment Berg Balance Test   Berg Balance Test   Sit to Stand Able to stand without using hands and stabilize independently   Standing Unsupported Able to stand safely 2 minutes   Sitting with Back Unsupported but Feet Supported on Floor or Stool Able to sit safely and securely 2 minutes   Stand to Sit Sits safely with minimal use of hands   Transfers Able to transfer safely, minor use of hands   Standing Unsupported with Eyes Closed Able to stand 10 seconds safely   Standing Ubsupported with Feet Together Able to place feet together independently and stand 1 minute safely   From Standing, Reach Forward with Outstretched Arm Can reach confidently >25 cm (10")   From Standing Position, Pick up Object from Floor Able to pick up shoe safely and easily   From Standing Position, Turn to Look Behind Over each Shoulder Looks behind from both sides and weight shifts well   Turn 360 Degrees Able to turn 360 degrees safely in 4 seconds or less   Standing Unsupported, Alternately Place Feet on Step/Stool Able to stand independently and safely and complete 8 steps in 20 seconds   Standing Unsupported, One Foot in Front Able to place foot tandem independently and hold 30 seconds   Standing on One Leg Able to lift leg independently and hold > 10 seconds   Total Score 56   Berg comment: no increase in pain                              PT Education - 08/07/15 1613    Education provided Yes   Education Details Reviewed and updated HEP, discussed edema management with ice/elevation/massage, discussed goals  met and discharge status with independence with HEP    Person(s) Educated Patient   Methods Explanation   Comprehension Verbalized understanding          PT Short Term Goals - 08/07/15 1530    PT SHORT TERM GOAL #1   Title Patient will  be independent in self-care management techniques for edema and pain, including but not limited to elevation, retromassage, and appropriate stretches/exercises in order to enhance safe self management of condition    Status Achieved   PT SHORT TERM GOAL #2   Title Patient will improve bilateral ankle dorsiflexion to at least 10 degrees in order to improve overall mobility and improve balance reaction skills    Status Achieved   PT SHORT TERM GOAL #3   Title Patient to be able to perform inversion/eversion with L ankle to at least 15 degrees eversion and at least 25 degrees inversion with pain 0/10 in order to improve mechanics and general ankle function    Status Achieved   PT SHORT TERM GOAL #4   Title Patient to be independent in correctly and consistently performing appropriate HEP, to be updated PRN    Baseline Reports compliance daily   Status Achieved   PT SHORT TERM GOAL #5   Title Patient will demonstrate no more than 0.5cm difference with circumferential measures between both ankles in order to reduce edema and pain, improve ROM    Status Achieved           PT Long Term Goals - 08/07/15 1538    PT LONG TERM GOAL #1   Title Patient will demonstrate strength at least 4+/5 in all ankle planes with pain 0/10 in order to improve overall mobility and promote overall ankle stability    Status Achieved   PT LONG TERM GOAL #2   Title Patient will be able to ambulate unlimited distances outside of boot, after MD has cleared patient to do so, with pain no more than 1/10 in order to assist patient in returning to PLOF    Status Achieved   PT LONG TERM GOAL #3   Title Patient will be able to ambulate over uneven surfaces with pain no more than 1/10  and no loss of balance in order to assist her in being able to work in her yard and ambulate outdoors    Baseline 08/07/2015:  No reports of pain on uneven surfaces, feels her vision inpact LOB episodes   Status Achieved   PT LONG TERM GOAL #4   Title Patient to score at least 52 on BERG balance test in order to demonstrate improved balance skills and in general overall reduced fall risk    Status Achieved               Plan - 08/07/15 1617    Clinical Impression Statement Pt has made significant progress from her intial evaluation, demonstrating improved ankle ROM, strength, balance and endurance. She notes some intermittent  "nagging" in her L ankle which is not limiting any activtiy and does not get higher than a 1/10 on the VAS. Strength was assessed via MMT revealing 4+/5 remaining ankle strength which will continue to improve with perfoming her HEP. Her ROM is full and pain free in BLE. Her 6MWT has improved significantly as well since her initial evaluation without any noted pain or issues with her LLE. She has met all updated short and long term goals and therapist reviewed her HEP as well as edema management at home with compression, elevation and ice. Pt has no concerns and feels she has made great improvement since she started coming to PT. Pt no longer benefits from skilled PT services and is discharged with independence with her updated HEP.   Pt will benefit from skilled therapeutic intervention in order to improve  on the following deficits Other (comment)  Pt no longer requires skilled PT services; indep with HEP.   PT Next Visit Plan D/C to HEP   PT Home Exercise Plan Updated HEP; printout given    Consulted and Agree with Plan of Care Patient          G-Codes - 2015-08-25 1758    Functional Assessment Tool Used FOTO; clinicall assessment of ROM, strength, balance and mobility.   Functional Limitation Mobility: Walking and moving around   Mobility: Walking and Moving  Around Goal Status [phone]) At least 1 percent but less than 20 percent impaired, limited or restricted   Mobility: Walking and Moving Around Discharge Status [phone]) At least 1 percent but less than 20 percent impaired, limited or restricted      Problem List Patient Active Problem List   Diagnosis Date Noted  . GERD (gastroesophageal reflux disease) 06/27/2013  . H/O: upper GI bleed 06/27/2013  . Epigastric pain 06/27/2013  . 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[place]) 11/21/2010   PHYSICAL THERAPY DISCHARGE SUMMARY  Visits from Start of Care: 16  Current functional level related to goals / functional outcomes:  Pt showing improvement in all areas of mobility, ROM, strength, balance and gait. She is independent with HEP. Please see assessment above for further clarification.   Remaining deficits: Strength 4+/5 in certain areas, but this will continue to improve with HEP.   Education / Equipment: Advanced home program   Plan: Patient agrees to discharge.  Patient goals were partially met. Patient is being discharged due to meeting the stated rehab goals.  ?????        Elly Modena PT, [place] [address] [place], [place], [address] Phone: [phone]   Fax:  [phone]  Name: JOANNIE MEDINE MRN: [id number] Date of Birth: 06/09/1945

## 2015-08-09 ENCOUNTER — Encounter (HOSPITAL_COMMUNITY): Payer: Commercial Managed Care - HMO | Admitting: Physical Therapy

## 2015-08-29 DIAGNOSIS — M25572 Pain in left ankle and joints of left foot: Secondary | ICD-10-CM | POA: Diagnosis not present

## 2015-08-29 DIAGNOSIS — S92152D Displaced avulsion fracture (chip fracture) of left talus, subsequent encounter for fracture with routine healing: Secondary | ICD-10-CM | POA: Diagnosis not present

## 2015-08-29 DIAGNOSIS — M25672 Stiffness of left ankle, not elsewhere classified: Secondary | ICD-10-CM | POA: Diagnosis not present

## 2015-10-01 ENCOUNTER — Encounter (INDEPENDENT_AMBULATORY_CARE_PROVIDER_SITE_OTHER): Payer: Self-pay | Admitting: *Deleted

## 2015-10-01 DIAGNOSIS — J0100 Acute maxillary sinusitis, unspecified: Secondary | ICD-10-CM | POA: Diagnosis not present

## 2015-10-31 DIAGNOSIS — H02834 Dermatochalasis of left upper eyelid: Secondary | ICD-10-CM | POA: Diagnosis not present

## 2015-10-31 DIAGNOSIS — H35373 Puckering of macula, bilateral: Secondary | ICD-10-CM | POA: Diagnosis not present

## 2015-10-31 DIAGNOSIS — Z961 Presence of intraocular lens: Secondary | ICD-10-CM | POA: Diagnosis not present

## 2015-10-31 DIAGNOSIS — H04123 Dry eye syndrome of bilateral lacrimal glands: Secondary | ICD-10-CM | POA: Diagnosis not present

## 2015-10-31 DIAGNOSIS — H40053 Ocular hypertension, bilateral: Secondary | ICD-10-CM | POA: Diagnosis not present

## 2015-11-20 DIAGNOSIS — M25552 Pain in left hip: Secondary | ICD-10-CM | POA: Diagnosis not present

## 2015-12-20 ENCOUNTER — Ambulatory Visit (INDEPENDENT_AMBULATORY_CARE_PROVIDER_SITE_OTHER): Payer: Commercial Managed Care - HMO | Admitting: Internal Medicine

## 2015-12-20 ENCOUNTER — Encounter (INDEPENDENT_AMBULATORY_CARE_PROVIDER_SITE_OTHER): Payer: Self-pay | Admitting: Internal Medicine

## 2015-12-20 ENCOUNTER — Ambulatory Visit (HOSPITAL_COMMUNITY)
Admission: RE | Admit: 2015-12-20 | Discharge: 2015-12-20 | Disposition: A | Discharge status: Home / Self Care | Payer: Commercial Managed Care - HMO | Source: Ambulatory Visit | Attending: Internal Medicine | Admitting: Internal Medicine

## 2015-12-20 VITALS — BP 130/62 | HR 112 | Temp 98.3°F | Ht 68.0 in | Wt 157.7 lb

## 2015-12-20 DIAGNOSIS — R0602 Shortness of breath: Secondary | ICD-10-CM | POA: Diagnosis not present

## 2015-12-20 DIAGNOSIS — K2100 Gastro-esophageal reflux disease with esophagitis, without bleeding: Secondary | ICD-10-CM

## 2015-12-20 DIAGNOSIS — R05 Cough: Secondary | ICD-10-CM | POA: Diagnosis not present

## 2015-12-20 DIAGNOSIS — R1013 Epigastric pain: Secondary | ICD-10-CM | POA: Insufficient documentation

## 2015-12-20 DIAGNOSIS — I700 Atherosclerosis of aorta: Secondary | ICD-10-CM | POA: Diagnosis not present

## 2015-12-20 NOTE — Progress Notes (Signed)
Subjective:    Patient ID: [name], female    DOB: April 30, 1946, 70 y.o.   MRN: [id number]  HPI here today for f/u of her GERD. She was last seen by Dr. Laural Golden in July of 2016. She tells me she is doing good. GERD for the most part controlled with Nexium. Her appetite has remained good. No weight loss. Usually has a BM dialy. No melena or BRRB. She stays active at home. No dysphagia, nausea or vomiting. She has no GI complaints.       07/01/2013: EGD  Indications: Patient is a 70 year old Caucasian female who presents with persistent symptoms of sore throat rash or burning epigastric discomfort and experienced an episode of hematemesis over 3 months ago. She's been on Nexium 20 mg daily without complete resolution of her symptoms. She believes her symptoms were triggered because of eyedrops.  Impression: Erosive reflux esophagitis with small sliding hiatal hernia. Nonerosive gastroduodenitis.   Review of Systems Past Medical History  Diagnosis Date  . Pulmonary embolism ([place])   . Tachycardia   . Hemoptysis   . Iron deficiency   . GERD (gastroesophageal reflux disease)   . Diverticulitis   . COPD (chronic obstructive pulmonary disease) ([place])   . Hyperlipidemia   . Breast cancer [facility])     left breast/lumpectomy/chemo/rad 2003    Past Surgical History  Procedure Laterality Date  . Breast lumpectomy    . Back surgery    . Colonoscopy  01/28/2012    Procedure: COLONOSCOPY;  Surgeon: Rogene Houston, MD;  [facility];  Service: Endoscopy;  Laterality: N/A;  1200  . Colonoscopy  01-2012  . Tonsillectomy      Patient was age 46  . Laparoscopic tubal ligation  1970  . Esophagogastroduodenoscopy N/A 07/01/2013    Procedure: ESOPHAGOGASTRODUODENOSCOPY (EGD);  Surgeon: Rogene Houston, MD;  [facility];  Service: Endoscopy;  Laterality:  N/A;  1:10    Allergies  Allergen Reactions  . Sulfa Antibiotics Other (See Comments)    Childhood Allergy     Current Outpatient Prescriptions on File Prior to Visit  Medication Sig Dispense Refill  . aspirin 81 MG tablet Take 81 mg by mouth daily.      Marland Kitchen CALCIUM PO Take 1 tablet by mouth daily.    . Cholecalciferol (VITAMIN D PO) Take 1 tablet by mouth daily.    . diclofenac (VOLTAREN) 75 MG EC tablet Take 75 mg by mouth. Patient states that she takes 1 by mouth every other day    . dorzolamide-timolol (COSOPT) 22.3-6.8 MG/ML ophthalmic solution Place 1 drop into both eyes 2 (two) times daily.     Marland Kitchen esomeprazole (NEXIUM) 20 MG capsule Take 20 mg by mouth daily at 12 noon.    . folic acid (FOLVITE) 1 MG tablet Take 1 mg by mouth daily.    . Multiple Vitamins-Minerals (PRESERVISION AREDS 2) CAPS Take by mouth 2 (two) times daily.     . Omega-3 Fatty Acids (FISH OIL) 1000 MG CAPS Take by mouth daily.    . Psyllium (METAMUCIL PO) Take by mouth 2 (two) times daily.    . Red Yeast Rice Extract (RED YEAST RICE PO) Take by mouth 2 (two) times daily.     No current facility-administered medications on file prior to visit.    Past Medical History  Diagnosis Date  . Pulmonary embolism ([place])   . Tachycardia   . Hemoptysis   . Iron deficiency   .  GERD (gastroesophageal reflux disease)   . Diverticulitis   . COPD (chronic obstructive pulmonary disease) ([place])   . Hyperlipidemia   . Breast cancer [facility])     left breast/lumpectomy/chemo/rad 2003    Past Surgical History  Procedure Laterality Date  . Breast lumpectomy    . Back surgery    . Colonoscopy  01/28/2012    Procedure: COLONOSCOPY;  Surgeon: Rogene Houston, MD;  [facility];  Service: Endoscopy;  Laterality: N/A;  1200  . Colonoscopy  01-2012  . Tonsillectomy      Patient was age 54  . Laparoscopic tubal ligation  1970  . Esophagogastroduodenoscopy N/A 07/01/2013    Procedure: ESOPHAGOGASTRODUODENOSCOPY (EGD);   Surgeon: Rogene Houston, MD;  [facility];  Service: Endoscopy;  Laterality: N/A;  1:10    Allergies  Allergen Reactions  . Sulfa Antibiotics Other (See Comments)    Childhood Allergy     Current Outpatient Prescriptions on File Prior to Visit  Medication Sig Dispense Refill  . aspirin 81 MG tablet Take 81 mg by mouth daily.      Marland Kitchen CALCIUM PO Take 1 tablet by mouth daily.    . Cholecalciferol (VITAMIN D PO) Take 1 tablet by mouth daily.    . diclofenac (VOLTAREN) 75 MG EC tablet Take 75 mg by mouth. Patient states that she takes 1 by mouth every other day    . dorzolamide-timolol (COSOPT) 22.3-6.8 MG/ML ophthalmic solution Place 1 drop into both eyes 2 (two) times daily.     Marland Kitchen esomeprazole (NEXIUM) 20 MG capsule Take 20 mg by mouth daily at 12 noon.    . folic acid (FOLVITE) 1 MG tablet Take 1 mg by mouth daily.    . Multiple Vitamins-Minerals (PRESERVISION AREDS 2) CAPS Take by mouth 2 (two) times daily.     . Omega-3 Fatty Acids (FISH OIL) 1000 MG CAPS Take by mouth daily.    . Psyllium (METAMUCIL PO) Take by mouth 2 (two) times daily.    . Red Yeast Rice Extract (RED YEAST RICE PO) Take by mouth 2 (two) times daily.     No current facility-administered medications on file prior to visit.        Objective:   Physical ExamBlood pressure 130/62, pulse 112, temperature 98.3 F (36.8 C), height 5\' 8"  (1.727 m), weight 157 lb 11.2 oz (71.532 kg).  Alert and oriented. Skin warm and dry. Oral mucosa is moist.   . Sclera anicteric, conjunctivae is pink. Thyroid not enlarged. No cervical lymphadenopathy. Lungs clear. Heart regular rate and rhythm.  Abdomen is soft. Bowel sounds are positive. No hepatomegaly. No abdominal masses felt. No tenderness.  No edema to lower extremities.         Assessment & Plan:     Erosive reflux esophagitis. She underwent EGD in January 2015. Symptoms are well controlled with anti-reflex measures and low dose PPI. Continue Nexium.    Patient is average risk for CRC. Last colonoscopy was in August 2013 and she would not need another screening until August 2023.  OV in 1 year.

## 2015-12-20 NOTE — Patient Instructions (Signed)
OV in 1 year.  

## 2015-12-24 DIAGNOSIS — E782 Mixed hyperlipidemia: Secondary | ICD-10-CM | POA: Diagnosis not present

## 2015-12-24 DIAGNOSIS — R7301 Impaired fasting glucose: Secondary | ICD-10-CM | POA: Diagnosis not present

## 2015-12-26 DIAGNOSIS — E875 Hyperkalemia: Secondary | ICD-10-CM | POA: Diagnosis not present

## 2015-12-26 DIAGNOSIS — R600 Localized edema: Secondary | ICD-10-CM | POA: Diagnosis not present

## 2015-12-26 DIAGNOSIS — R7301 Impaired fasting glucose: Secondary | ICD-10-CM | POA: Diagnosis not present

## 2015-12-26 DIAGNOSIS — E782 Mixed hyperlipidemia: Secondary | ICD-10-CM | POA: Diagnosis not present

## 2015-12-26 DIAGNOSIS — Z0001 Encounter for general adult medical examination with abnormal findings: Secondary | ICD-10-CM | POA: Diagnosis not present

## 2016-01-01 ENCOUNTER — Ambulatory Visit (INDEPENDENT_AMBULATORY_CARE_PROVIDER_SITE_OTHER): Payer: Commercial Managed Care - HMO | Admitting: Orthopedic Surgery

## 2016-01-01 ENCOUNTER — Ambulatory Visit (HOSPITAL_COMMUNITY)
Admission: RE | Admit: 2016-01-01 | Discharge: 2016-01-01 | Disposition: A | Discharge status: Home / Self Care | Payer: Commercial Managed Care - HMO | Source: Ambulatory Visit | Attending: Orthopedic Surgery | Admitting: Orthopedic Surgery

## 2016-01-01 ENCOUNTER — Encounter: Payer: Self-pay | Admitting: Orthopedic Surgery

## 2016-01-01 VITALS — BP 138/72 | HR 101 | Ht 68.5 in | Wt 159.0 lb

## 2016-01-01 DIAGNOSIS — M545 Low back pain, unspecified: Secondary | ICD-10-CM

## 2016-01-01 DIAGNOSIS — M25552 Pain in left hip: Secondary | ICD-10-CM

## 2016-01-01 DIAGNOSIS — I700 Atherosclerosis of aorta: Secondary | ICD-10-CM | POA: Diagnosis not present

## 2016-01-01 DIAGNOSIS — M5136 Other intervertebral disc degeneration, lumbar region: Secondary | ICD-10-CM | POA: Insufficient documentation

## 2016-01-01 MED ORDER — PREDNISONE 10 MG PO TABS: 10.0000 mg | ORAL_TABLET | Freq: Every day | ORAL | 0 refills | Status: DC

## 2016-01-01 NOTE — Progress Notes (Signed)
Chief Complaint  Patient presents with  . New Patient (Initial Visit)    Left hip pain   HPI 70 year old female presents with 6 months history of left hip pain no injury pain is 1 out of 10 after activity. It's dull it's worse with sitting and activity better if she lies down. She has tried some Tylenol. She does have a history of back surgery in 1992. The pain radiates down her left leg to the knee but not below    Review of Systems  Constitutional: Negative.   Eyes: Negative.        Problems with vision  Genitourinary:       Loss of bladder control, frequent urination, excessive urination.  Musculoskeletal: Positive for back pain.  Neurological: Negative.     Past Medical History:  Diagnosis Date  . Breast cancer ([place])    left breast/lumpectomy/chemo/rad 2003  . COPD (chronic obstructive pulmonary disease) ([place])   . Diverticulitis   . GERD (gastroesophageal reflux disease)   . Hemoptysis   . Hyperlipidemia   . Iron deficiency   . Pulmonary embolism ([place])   . Tachycardia     Past Surgical History:  Procedure Laterality Date  . BACK SURGERY    . BREAST LUMPECTOMY    . COLONOSCOPY  01/28/2012   Procedure: COLONOSCOPY;  Surgeon: Rogene Houston, MD;  [facility];  Service: Endoscopy;  Laterality: N/A;  1200  . COLONOSCOPY  01-2012  . ESOPHAGOGASTRODUODENOSCOPY N/A 07/01/2013   Procedure: ESOPHAGOGASTRODUODENOSCOPY (EGD);  Surgeon: Rogene Houston, MD;  [facility];  Service: Endoscopy;  Laterality: N/A;  1:10  . LAPAROSCOPIC TUBAL LIGATION  1970  . TONSILLECTOMY     Patient was age 31   Family History  Problem Relation Age of Onset  . Heart disease Mother   . Alzheimer's disease Mother   . Heart disease Father   . Lung disease Father   . Healthy Son    Social History  Substance Use Topics  . Smoking status: Current Every Day Smoker    Packs/day: 0.50    Years: 30.00    Types: Cigarettes  . Smokeless tobacco: Never Used  . Alcohol use No     Current Outpatient Prescriptions:  .  aspirin 81 MG tablet, Take 81 mg by mouth daily.  , Disp: , Rfl:  .  CALCIUM PO, Take 1 tablet by mouth daily., Disp: , Rfl:  .  Cholecalciferol (VITAMIN D PO), Take 1 tablet by mouth daily., Disp: , Rfl:  .  diclofenac (VOLTAREN) 75 MG EC tablet, Take 75 mg by mouth. Patient states that she takes 1 by mouth every other day, Disp: , Rfl:  .  dorzolamide-timolol (COSOPT) 22.3-6.8 MG/ML ophthalmic solution, Place 1 drop into both eyes 2 (two) times daily. , Disp: , Rfl:  .  esomeprazole (NEXIUM) 20 MG capsule, Take 20 mg by mouth daily at 12 noon., Disp: , Rfl:  .  folic acid (FOLVITE) 1 MG tablet, Take 1 mg by mouth daily., Disp: , Rfl:  .  Multiple Vitamins-Minerals (PRESERVISION AREDS 2) CAPS, Take by mouth 2 (two) times daily. , Disp: , Rfl:  .  Omega-3 Fatty Acids (FISH OIL) 1000 MG CAPS, Take by mouth daily., Disp: , Rfl:  .  Psyllium (METAMUCIL PO), Take by mouth 2 (two) times daily., Disp: , Rfl:  .  Red Yeast Rice Extract (RED YEAST RICE PO), Take by mouth 2 (two) times daily., Disp: , Rfl:   BP  138/72   Pulse (!) 101   Ht 5' 8.5" (1.74 m)   Physical Exam  Constitutional: She is oriented to person, place, and time. She appears well-developed and well-nourished. No distress.  Cardiovascular: Normal rate and intact distal pulses.   Neurological: She is alert and oriented to person, place, and time. She has normal reflexes. She exhibits normal muscle tone. Coordination normal.  Skin: Skin is warm and dry. No rash noted. She is not diaphoretic. No erythema. No pallor.  Psychiatric: She has a normal mood and affect. Her behavior is normal. Judgment and thought content normal.   Gait:  Ortho Exam Moderate kyphosis in the thoracic 6 spine tenderness in the lower lumbar spine central and left. With incision healed. Decreased range of motion.  Right and left hip range of motion is normal both hips are stable strength in the right and left leg  normal hip flexors knee flexor extensor dorsiflexes plantar flexors in both hips have normal skin without nodularity ulceration. Distal pulses are intact no edema. Distal sensation is normal in each leg. Reflexes are normal knee and ankle. Straight leg raise is negative.  ASSESSMENT: My personal interpretation of the images:  Hospital films were obtained. Have ordered by our office.  AP pelvis and AP lateral left hip normal hip joints  Lumbar spine 3 views degenerative disc disease at L5-S1 mild spondylolisthesis L4-L5 and L5-S1.   Encounter Diagnoses  Name Primary?  . Left hip pain Yes  . Left-sided low back pain without sciatica     PLAN Physical therapy  Meds ordered this encounter  Medications  . predniSONE (DELTASONE) 10 MG tablet    Sig: Take 1 tablet (10 mg total) by mouth daily.    Dispense:  21 tablet    Refill:  0   4 weeks return for follow-up  Arther Abbott, MD 01/01/2016 10:45 AM

## 2016-01-01 NOTE — Patient Instructions (Signed)
CALL [facility] THERAPY DEPT TO SCHEDULE THERAPY VISITS [phone] MEDICATION SENT TO YOUR PHARMACY

## 2016-01-14 ENCOUNTER — Ambulatory Visit (HOSPITAL_COMMUNITY): Payer: Commercial Managed Care - HMO | Attending: Orthopedic Surgery

## 2016-01-14 DIAGNOSIS — M25552 Pain in left hip: Secondary | ICD-10-CM | POA: Insufficient documentation

## 2016-01-14 DIAGNOSIS — R293 Abnormal posture: Secondary | ICD-10-CM | POA: Diagnosis not present

## 2016-01-14 DIAGNOSIS — M62838 Other muscle spasm: Secondary | ICD-10-CM | POA: Diagnosis not present

## 2016-01-14 NOTE — Therapy (Addendum)
[place], [place], [address] Phone: [phone]   Fax:  [phone]  Physical Therapy Evaluation  Patient Details  Name: [name] MRN: [id number] Date of Birth: July 23, 1945 Referring Provider: Arther Abbott   Encounter Date: 01/14/2016      PT End of Session - 01/14/16 1650    Visit Number 1   Number of Visits 16   Date for PT Re-Evaluation 02/14/16   Authorization Type [employer]   Authorization Time Period 01/14/16-03/15/16   Authorization - Visit Number 1   Authorization - Number of Visits 10   PT Start Time 1302   PT Stop Time 1402   PT Time Calculation (min) 60 min   Activity Tolerance Patient tolerated treatment well   Behavior During Therapy [facility] for tasks assessed/performed      Past Medical History:  Diagnosis Date  . Breast cancer ([place])    left breast/lumpectomy/chemo/rad 2003  . COPD (chronic obstructive pulmonary disease) ([place])   . Diverticulitis   . GERD (gastroesophageal reflux disease)   . Hemoptysis   . Hyperlipidemia   . Iron deficiency   . Pulmonary embolism ([place])   . Tachycardia     Past Surgical History:  Procedure Laterality Date  . BACK SURGERY    . BREAST LUMPECTOMY    . COLONOSCOPY  01/28/2012   Procedure: COLONOSCOPY;  Surgeon: Rogene Houston, MD;  [facility];  Service: Endoscopy;  Laterality: N/A;  1200  . COLONOSCOPY  01-2012  . ESOPHAGOGASTRODUODENOSCOPY N/A 07/01/2013   Procedure: ESOPHAGOGASTRODUODENOSCOPY (EGD);  Surgeon: Rogene Houston, MD;  [facility];  Service: Endoscopy;  Laterality: N/A;  1:10  . LAPAROSCOPIC TUBAL LIGATION  1970  . TONSILLECTOMY     Patient was age 70    There were no vitals filed for this visit.       Subjective Assessment - 01/14/16 1308    Subjective Pt reports some in intermittent pain in her posterolateral hip, with referral from lateral iliac crest to through the left lateral promial third  of femur. Pain is achy. She reports pain started about 8 months ago, but has had 3 really bad exacerbations within the last few months. These are exacerbated by house/yard work.    Pertinent History Tachycardia; recent osteopenia noted in imaging report (pt unclear about BMD testing)   Limitations Sitting   How long can you sit comfortably? Pain is worse with sitting prolonged periods but is tolerated.   How long can you stand comfortably? Aggravated by low back pain; low back hurts chronically, but does not change with activity, and does not relate to changes with CC.    How long can you walk comfortably? Only when exacerbated.    Diagnostic tests XRAYS only, hip/pelvis    Patient Stated Goals Be able to perform house work/yardwork without having to stop due to pain    Currently in Pain? No/denies  Left hip discomfort/achiness; wil nto call it pain            [facility] PT Assessment - 01/14/16 0001      Assessment   Medical Diagnosis L posterolateral hip pain   Referring Provider Arther Abbott    Onset Date/Surgical Date --  About 8 months    Hand Dominance Right   Next MD Visit --  Middle September    Prior Therapy Other diagnoses only (at this location)      Balance Screen   Has  the patient fallen in the past 6 months Yes   How many times? 2   Has the patient had a decrease in activity level because of a fear of falling?  Yes   Is the patient reluctant to leave their home because of a fear of falling?  No     Posture/Postural Control   Posture/Postural Control Postural limitations   Postural Limitations Increased lumbar lordosis;Increased thoracic kyphosis     AROM   Overall AROM  Deficits     PROM   PROM Assessment Site Hip   Right/Left Hip Right;Left   Right Hip External Rotation  45  spingy end-feel   Right Hip Internal Rotation  45   Left Hip Extension 8   Left Hip Flexion 112  limited by lordosis, pain free   Left Hip External Rotation  45   Left Hip Internal  Rotation  45   Left Hip ADduction 19     Strength   Right Hip Extension 3-/5   Right Hip External Rotation  5/5  seated   Right Hip Internal Rotation 5/5  seated   Right Hip ABduction 4/5   Right Hip ADduction 5/5   Left Hip Extension 3-/5   Left Hip External Rotation 5/5  seated   Left Hip Internal Rotation 5/5  seated   Left Hip ABduction 5/5   Left Hip ADduction 5/5   Right/Left Knee Right;Left   Right Knee Flexion 5/5   Right Knee Extension 5/5   Left Knee Flexion 5/5   Left Knee Extension 5/5   Right Ankle Dorsiflexion 5/5  hallux ext (5/5); toe ext (5/5)   Left Ankle Dorsiflexion 5/5  hallux ext (5/5); toe ext (5/5)     Right Hip   Right Hip Extension 8   Right Hip Flexion 110  limited by lordosis, pain free   Right Hip ABduction 21     Palpation   Spinal mobility mild hypomobility   Palpation comment L hamstrings (-); glute med/min painful bilat, unfamiliar pain; Piriformis negative  Left glute max painful with trigger points along the sacrum      Special Tests    Special Tests Lumbar;Sacrolliac Tests;Hip Special Tests   Lumbar Tests other   Sacroiliac Tests  Sacral Thrust   Hip Special Tests  Marcello Moores Test;Ely's Test     other   Findings Negative   Side  Left   Comments PA glides/spring testing- all left  L5/S1 facet, L4/L5 facet, L3/L4 facet     Sacral thrust    Findings Negative   Side Left   Comments Doesn not present like an SI problem     Thomas Test    Findings Positive   Side Right;Left   Comments bilat hip extension: -5* bil  Right rectus very tight     Ely's Test   Findings Positive   Side Right;Left     Balance   Balance Assessed Yes     High Level Balance   High Level Balance Comments R-SLS: 10.20; L-SLS 9.30s     Functional Gait  Assessment   Gait assessed  No                   [facility] Adult PT Treatment/Exercise - 01/14/16 0001      Manual Therapy   Myofascial Release Trigger point release  Left glute max  along the sacral border S2, S3, S4  PT Education - 01/14/16 1643    Education provided Yes   Education Details Explained how pain can be generated from knots in muscle and easily assumed to be related to joint pain.    Person(s) Educated Patient   Methods Explanation   Comprehension Verbalized understanding          PT Short Term Goals - 01/14/16 1724      PT SHORT TERM GOAL #1   Title After 2 weeks pt will be independent in HEP to reduce symptoms at home and progress function.    Status New     PT SHORT TERM GOAL #2   Title After 4 weeks patient will demonstrate 5 degrees of hip extension bil during thomas test to improve lumbosacral mechanics during gait for reduced pain with function.     Baseline -5 degrees extension bilat   Status New     PT SHORT TERM GOAL #3   Title After 4 weeks patient will demonstrate 4/5 strength in MMT of hip extension in prone bilat to improve strength for yard/house work activity.    Baseline 3-/5, range limited and painful   Status New           PT Long Term Goals - 01/14/16 1724      PT LONG TERM GOAL #1   Title After 8 weeks pt will be independent in advanced HEP to progress function after DC.    Status New     PT LONG TERM GOAL #2   Title After 8 weeks patient will demonstrate 10 degrees of hip extension bil during thomas test to improve lumbosacral mechanics during gait for reduced pain with function.     Status New     PT LONG TERM GOAL #3   Title After 8 weeks patient will demonstrate 4+/5 strength in MMT of hip extension in prone bilat to improve strength for yard/house work activity.    Status New               Plan - 01/14/16 1653    Clinical Impression Statement Patient presenting today with continued achiness along the left posterolateral gluteal region. Pt demonstrating impairment of pain, joint range of motion, strength, balance, and activity tolerance, limiting ability to perform typical  leisure and household activities at prior level of fuction without exacerbation of pain. Light touch sensation is intact in BLE. Subjective report inconsistent with typical presentation of hip OA, hence is not exahaustively examined, however, assessment that is performed reveals no suspected intraarticular impairment in the hip. Bilat hips present with mobility restrictions improsed by tightness in hip flexors. In depth testing of lumbar spine reveals excessive lordosis, lack of lumbar flexion, and some pain with perturbation of PA spring testing, all of which is local and does not reproduce the patients CC. PA springing of Left lumbar facet joints while in lordosis is noted to 'feel good' and does not reproduce any pain at all. Soft tissue assessment reveals tenderness in bilat glute med/min superior the great trochanters bil, but is not familiar pain. Pt's CC is exacerbated by palpation of myofascial trigger points in the Left Gluteus Maximus along the attachment ot the sacrum (S2, S3, S4). This pain is reduced after extensive manual therapy, both in prone and return to seated posture. Palpation of the hamstrings attachment at the L ischial tuberaosity if pain free. Pt endorses repeated flexion as the primary aggravating factor, an activity that engages the glute max as a hip extensor.  Rehab Potential Excellent   PT Frequency 2x / week   PT Duration 4 weeks   PT Treatment/Interventions ADLs/Self Care Home Management;Cryotherapy;Gait training;Stair training;Functional mobility training;Therapeutic activities;Therapeutic exercise;Balance training;Neuromuscular re-education;Patient/family education;Manual techniques   PT Next Visit Plan Assess response to treatment; Review treatment goals; issue and teach HEP;    PT Home Exercise Plan Next session: HEP --> glute max bridge, Clam shell with band, Thomas Test Psoas Stretch (3x60sec bilat)    Consulted and Agree with Plan of Care Patient      Patient will  benefit from skilled therapeutic intervention in order to improve the following deficits and impairments:  Abnormal gait, Decreased activity tolerance, Decreased balance, Decreased mobility, Decreased strength, Decreased range of motion, Increased muscle spasms, Pain, Improper body mechanics, Postural dysfunction  Visit Diagnosis: Pain in left hip - Plan: PT plan of care cert/re-cert  Abnormal posture - Plan: PT plan of care cert/re-cert  Other muscle spasm - Plan: PT plan of care cert/re-cert     [id number]  PT G-Codes  Functional Assessment Tool Used Clinical Judgment  Functional Limitation Mobility: Walking and moving around  Mobility: Walking and Moving Around Current Status [phone]) CJ  Mobility: Walking and Moving Around Goal Status [id number]) CI      Problem List Patient Active Problem List   Diagnosis Date Noted  . GERD (gastroesophageal reflux disease) 06/27/2013  . H/O: upper GI bleed 06/27/2013  . Epigastric pain 06/27/2013  . 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  10:09 AM, 01/29/16   Etta Grandchild, PT, DPT Physical Therapist at [place] [phone] (office)

## 2016-01-16 ENCOUNTER — Ambulatory Visit (HOSPITAL_COMMUNITY): Payer: Commercial Managed Care - HMO | Admitting: Physical Therapy

## 2016-01-16 DIAGNOSIS — M25552 Pain in left hip: Secondary | ICD-10-CM | POA: Diagnosis not present

## 2016-01-16 DIAGNOSIS — R293 Abnormal posture: Secondary | ICD-10-CM | POA: Diagnosis not present

## 2016-01-16 DIAGNOSIS — M62838 Other muscle spasm: Secondary | ICD-10-CM

## 2016-01-16 NOTE — Therapy (Signed)
[place], [place], [address] Phone: [phone]   Fax:  [phone]  Physical Therapy Treatment  Patient Details  Name: [name] MRN: [id number] Date of Birth: 09-Mar-1946 Referring Provider: Arther Abbott   Encounter Date: 01/16/2016      PT End of Session - 01/16/16 1313    Visit Number 2   Number of Visits 16   Date for PT Re-Evaluation 02/14/16   Authorization Type [employer]   Authorization Time Period 01/14/16-03/15/16   Authorization - Visit Number 2   Authorization - Number of Visits 10   PT Start Time 1301   PT Stop Time 1345   PT Time Calculation (min) 44 min   Activity Tolerance Patient tolerated treatment well   Behavior During Therapy [facility] for tasks assessed/performed      Past Medical History:  Diagnosis Date  . Breast cancer ([place])    left breast/lumpectomy/chemo/rad 2003  . COPD (chronic obstructive pulmonary disease) ([place])   . Diverticulitis   . GERD (gastroesophageal reflux disease)   . Hemoptysis   . Hyperlipidemia   . Iron deficiency   . Pulmonary embolism ([place])   . Tachycardia     Past Surgical History:  Procedure Laterality Date  . BACK SURGERY    . BREAST LUMPECTOMY    . COLONOSCOPY  01/28/2012   Procedure: COLONOSCOPY;  Surgeon: Rogene Houston, MD;  [facility];  Service: Endoscopy;  Laterality: N/A;  1200  . COLONOSCOPY  01-2012  . ESOPHAGOGASTRODUODENOSCOPY N/A 07/01/2013   Procedure: ESOPHAGOGASTRODUODENOSCOPY (EGD);  Surgeon: Rogene Houston, MD;  [facility];  Service: Endoscopy;  Laterality: N/A;  1:10  . LAPAROSCOPIC TUBAL LIGATION  1970  . TONSILLECTOMY     Patient was age 70    There were no vitals filed for this visit.      Subjective Assessment - 01/16/16 1304    Subjective Pt reports that her hip is only a little sore today, but overall feeling better.    Pertinent History Tachycardia; recent osteopenia noted in  imaging report (pt unclear about BMD testing)   Limitations Sitting   How long can you sit comfortably? Pain is worse with sitting prolonged periods but is tolerated.   How long can you stand comfortably? Aggravated by low back pain; low back hurts chronically, but does not change with activity, and does not relate to changes with CC.    How long can you walk comfortably? Only when exacerbated.    Diagnostic tests XRAYS only, hip/pelvis    Patient Stated Goals Be able to perform house work/yardwork without having to stop due to pain    Currently in Pain? No/denies                         [facility] Adult PT Treatment/Exercise - 01/16/16 0001      Exercises   Exercises Knee/Hip     Knee/Hip Exercises: Stretches   Hip Flexor Stretch 3 reps;60 seconds;Both   Hip Flexor Stretch Limitations supine    Other Knee/Hip Stretches SKTC 5x10 each     Knee/Hip Exercises: Supine   Bridges Both;2 sets;15 reps     Knee/Hip Exercises: Sidelying   Clams 2x10 reps, green TB                PT Education - 01/16/16 1311    Education provided Yes   Education Details HEP; importance of good  posture during housework to decrease risk of injury.   Person(s) Educated Patient   Methods Explanation;Handout   Comprehension Verbalized understanding;Returned demonstration          PT Short Term Goals - 01/14/16 1724      PT SHORT TERM GOAL #1   Title After 2 weeks pt will be independent in HEP to reduce symptoms at home and progress function.    Status New     PT SHORT TERM GOAL #2   Title After 4 weeks patient will demonstrate 5 degrees of hip extension bil during thomas test to improve lumbosacral mechanics during gait for reduced pain with function.     Baseline -5 degrees extension bilat   Status New     PT SHORT TERM GOAL #3   Title After 4 weeks patient will demonstrate 4/5 strength in MMT of hip extension in prone bilat to improve strength for yard/house work activity.     Baseline 3-/5, range limited and painful   Status New           PT Long Term Goals - 01/14/16 1724      PT LONG TERM GOAL #1   Title After 8 weeks pt will be independent in advanced HEP to progress function after DC.    Status New     PT LONG TERM GOAL #2   Title After 8 weeks patient will demonstrate 10 degrees of hip extension bil during thomas test to improve lumbosacral mechanics during gait for reduced pain with function.     Status New     PT LONG TERM GOAL #3   Title After 8 weeks patient will demonstrate 4+/5 strength in MMT of hip extension in prone bilat to improve strength for yard/house work activity.    Status New               Plan - 01/16/16 1320    Clinical Impression Statement Pt presents today with improved pain report since her initial evaluation. Session focused on introduction of therex to improve LE strength and flexibility. Pt able to perform all exercises with minimal cues from the therapist. Will continue with current POC.   Rehab Potential Excellent   PT Frequency 2x / week   PT Duration 4 weeks   PT Treatment/Interventions ADLs/Self Care Home Management;Cryotherapy;Gait training;Stair training;Functional mobility training;Therapeutic activities;Therapeutic exercise;Balance training;Neuromuscular re-education;Patient/family education;Manual techniques   PT Next Visit Plan progress hip strength (extensor/abductor specifically) ; STM along glute max as needed   PT Home Exercise Plan  bridge, Clam shell with band, Thomas Test Psoas Stretch (3x60sec bilat)    Consulted and Agree with Plan of Care Patient      Patient will benefit from skilled therapeutic intervention in order to improve the following deficits and impairments:  Abnormal gait, Decreased activity tolerance, Decreased balance, Decreased mobility, Decreased strength, Decreased range of motion, Increased muscle spasms, Pain, Improper body mechanics, Postural dysfunction  Visit  Diagnosis: Pain in left hip  Abnormal posture  Other muscle spasm     Problem List Patient Active Problem List   Diagnosis Date Noted  . GERD (gastroesophageal reflux disease) 06/27/2013  . H/O: upper GI bleed 06/27/2013  . Epigastric pain 06/27/2013  . 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

## 2016-01-21 ENCOUNTER — Ambulatory Visit (HOSPITAL_COMMUNITY): Payer: Commercial Managed Care - HMO | Admitting: Physical Therapy

## 2016-01-21 DIAGNOSIS — R293 Abnormal posture: Secondary | ICD-10-CM | POA: Diagnosis not present

## 2016-01-21 DIAGNOSIS — M62838 Other muscle spasm: Secondary | ICD-10-CM | POA: Diagnosis not present

## 2016-01-21 DIAGNOSIS — M25552 Pain in left hip: Secondary | ICD-10-CM | POA: Diagnosis not present

## 2016-01-21 NOTE — Therapy (Signed)
[place], [place], [address] Phone: [phone]   Fax:  [phone]  Physical Therapy Treatment  Patient Details  Name: [name] MRN: [id number] Date of Birth: 1945/08/19 Referring Provider: Arther Abbott   Encounter Date: 01/21/2016      PT End of Session - 01/21/16 1434    Visit Number 3   Number of Visits 16   Date for PT Re-Evaluation 02/14/16   Authorization Type [employer]   Authorization Time Period 01/14/16-03/15/16   Authorization - Visit Number 3   Authorization - Number of Visits 10   PT Start Time [id number]   PT Stop Time 1426   PT Time Calculation (min) 38 min   Activity Tolerance Patient tolerated treatment well   Behavior During Therapy [facility] for tasks assessed/performed      Past Medical History:  Diagnosis Date  . Breast cancer ([place])    left breast/lumpectomy/chemo/rad 2003  . COPD (chronic obstructive pulmonary disease) ([place])   . Diverticulitis   . GERD (gastroesophageal reflux disease)   . Hemoptysis   . Hyperlipidemia   . Iron deficiency   . Pulmonary embolism ([place])   . Tachycardia     Past Surgical History:  Procedure Laterality Date  . BACK SURGERY    . BREAST LUMPECTOMY    . COLONOSCOPY  01/28/2012   Procedure: COLONOSCOPY;  Surgeon: Rogene Houston, MD;  [facility];  Service: Endoscopy;  Laterality: N/A;  1200  . COLONOSCOPY  01-2012  . ESOPHAGOGASTRODUODENOSCOPY N/A 07/01/2013   Procedure: ESOPHAGOGASTRODUODENOSCOPY (EGD);  Surgeon: Rogene Houston, MD;  [facility];  Service: Endoscopy;  Laterality: N/A;  1:10  . LAPAROSCOPIC TUBAL LIGATION  1970  . TONSILLECTOMY     Patient was age 70    There were no vitals filed for this visit.      Subjective Assessment - 01/21/16 1349    Subjective Patient arrives stating that she is sore from some cleaning she did this weekends, her knees are bothering her more than her hip though. Her back is  still there but not bad.    Pertinent History Tachycardia; recent osteopenia noted in imaging report (pt unclear about BMD testing)   Currently in Pain? No/denies                         [facility] Adult PT Treatment/Exercise - 01/21/16 0001      Knee/Hip Exercises: Stretches   Hip Flexor Stretch 2 reps;30 seconds   Hip Flexor Stretch Limitations thomas position    Other Knee/Hip Stretches SKTC 5x10 each   Other Knee/Hip Stretches lumbar rotations 5x10 each      Knee/Hip Exercises: Seated   Sit to Sand with UE support;5 reps  x10 each side      Knee/Hip Exercises: Supine   Bridges Both;2 sets;15 reps   Other Supine Knee/Hip Exercises supine hip ABD with red TB 2x10      Prone hip extensions with knee bent 1x10 each            PT Education - 01/21/16 1433    Education provided No          PT Short Term Goals - 01/14/16 1724      PT SHORT TERM GOAL #1   Title After 2 weeks pt will be independent in HEP to reduce symptoms at home and progress function.    Status New  PT SHORT TERM GOAL #2   Title After 4 weeks patient will demonstrate 5 degrees of hip extension bil during thomas test to improve lumbosacral mechanics during gait for reduced pain with function.     Baseline -5 degrees extension bilat   Status New     PT SHORT TERM GOAL #3   Title After 4 weeks patient will demonstrate 4/5 strength in MMT of hip extension in prone bilat to improve strength for yard/house work activity.    Baseline 3-/5, range limited and painful   Status New           PT Long Term Goals - 01/14/16 1724      PT LONG TERM GOAL #1   Title After 8 weeks pt will be independent in advanced HEP to progress function after DC.    Status New     PT LONG TERM GOAL #2   Title After 8 weeks patient will demonstrate 10 degrees of hip extension bil during thomas test to improve lumbosacral mechanics during gait for reduced pain with function.     Status New     PT LONG  TERM GOAL #3   Title After 8 weeks patient will demonstrate 4+/5 strength in MMT of hip extension in prone bilat to improve strength for yard/house work activity.    Status New               Plan - 01/21/16 1434    Clinical Impression Statement Continued work on functional lumbar and hip stretching as well as strengthening of functional musculature around the hips as well; patient reports her back/hips are feeling better overall since starting PT. Continue to note significant weakness regarding hip musculature, especially ABD and extensor regions.    Rehab Potential Excellent   PT Frequency 2x / week   PT Duration 4 weeks   PT Treatment/Interventions ADLs/Self Care Home Management;Cryotherapy;Gait training;Stair training;Functional mobility training;Therapeutic activities;Therapeutic exercise;Balance training;Neuromuscular re-education;Patient/family education;Manual techniques   PT Next Visit Plan continue proximal strengthening and manual PRN; work on sit to stand with no UEs (likely limited due to hip extensors)   PT Home Exercise Plan  bridge, Clam shell with band, Thomas Test Psoas Stretch (3x60sec bilat)    Consulted and Agree with Plan of Care Patient      Patient will benefit from skilled therapeutic intervention in order to improve the following deficits and impairments:  Abnormal gait, Decreased activity tolerance, Decreased balance, Decreased mobility, Decreased strength, Decreased range of motion, Increased muscle spasms, Pain, Improper body mechanics, Postural dysfunction  Visit Diagnosis: Pain in left hip  Abnormal posture  Other muscle spasm     Problem List Patient Active Problem List   Diagnosis Date Noted  . GERD (gastroesophageal reflux disease) 06/27/2013  . H/O: upper GI bleed 06/27/2013  . Epigastric pain 06/27/2013  . 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

## 2016-01-23 ENCOUNTER — Ambulatory Visit (HOSPITAL_COMMUNITY): Payer: Commercial Managed Care - HMO | Admitting: Physical Therapy

## 2016-01-23 DIAGNOSIS — R293 Abnormal posture: Secondary | ICD-10-CM

## 2016-01-23 DIAGNOSIS — M25552 Pain in left hip: Secondary | ICD-10-CM

## 2016-01-23 DIAGNOSIS — M62838 Other muscle spasm: Secondary | ICD-10-CM

## 2016-01-23 NOTE — Therapy (Signed)
PHYSICAL THERAPY DISCHARGE SUMMARY  Visits from Start of Care: 4  Current functional level related to goals / functional outcomes: *see below   Remaining deficits: *see below    Education / Equipment: *see below  Plan: Patient agrees to discharge.  Patient goals were met. Patient is being discharged due to being pleased with the current functional level.  ?????        [place], [place], [address] Phone: [phone]   Fax:  [phone]  Physical Therapy Treatment  Patient Details  Name: [name] MRN: [id number] Date of Birth: 01/25/1946 Referring Provider: Arther Abbott  Encounter Date: 01/23/2016      PT End of Session - 01/23/16 1401    Visit Number 4   Number of Visits 16   Date for PT Re-Evaluation 02/14/16   Authorization Type [employer]   Authorization Time Period 01/14/16-03/15/16   Authorization - Visit Number 4   Authorization - Number of Visits 10   PT Start Time 1300   PT Stop Time 1345   PT Time Calculation (min) 45 min   Activity Tolerance Patient tolerated treatment well   Behavior During Therapy [facility] for tasks assessed/performed      Past Medical History:  Diagnosis Date  . Breast cancer ([place])    left breast/lumpectomy/chemo/rad 2003  . COPD (chronic obstructive pulmonary disease) ([place])   . Diverticulitis   . GERD (gastroesophageal reflux disease)   . Hemoptysis   . Hyperlipidemia   . Iron deficiency   . Pulmonary embolism ([place])   . Tachycardia     Past Surgical History:  Procedure Laterality Date  . BACK SURGERY    . BREAST LUMPECTOMY    . COLONOSCOPY  01/28/2012   Procedure: COLONOSCOPY;  Surgeon: Rogene Houston, MD;  Location[facility];  Service: Endoscopy;  Laterality: N/A;  1200  . COLONOSCOPY  01-2012  . ESOPHAGOGASTRODUODENOSCOPY N/A 07/01/2013   Procedure: ESOPHAGOGASTRODUODENOSCOPY (EGD);  Surgeon: Rogene Houston, MD;  Location: [facility] ENDO  SUITE;  Service: Endoscopy;  Laterality: N/A;  1:10  . LAPAROSCOPIC TUBAL LIGATION  1970  . TONSILLECTOMY     Patient was age 3    There were no vitals filed for this visit.      Subjective Assessment - 01/23/16 1348    Subjective "I want to be done with therapy"  Pt states she has not had any issues since beginning therapy, is independent with her exercises.   Currently in Pain? No/denies            [facility] PT Assessment - 01/23/16 0001      Assessment   Medical Diagnosis L posterolateral hip pain   Referring Provider Arther Abbott   Hand Dominance Right     Strength   Right Hip Extension 4/5  was 3-/5   Right Hip External Rotation  5/5   Right Hip Internal Rotation 5/5   Right Hip ABduction 4/5  was 4/5   Right Hip ADduction 5/5   Left Hip Extension 4/5  was 3-/5   Left Hip External Rotation 5/5   Left Hip Internal Rotation 5/5   Left Hip ABduction 5/5   Left Hip ADduction 5/5   Right Knee Flexion 5/5   Right Knee Extension 5/5   Left Knee Flexion 5/5   Left Knee Extension 5/5   Right Ankle Dorsiflexion 5/5   Left Ankle Dorsiflexion 5/5  [place] Adult PT Treatment/Exercise - 01/23/16 0001      Knee/Hip Exercises: Stretches   Other Knee/Hip Stretches lumbar rotations 5x10 each      Knee/Hip Exercises: Seated   Sit to Sand 5 reps;without UE support     Knee/Hip Exercises: Supine   Bridges Both;2 sets;15 reps   Other Supine Knee/Hip Exercises supine hip ABD with red TB 2x10                 PT Education - 01/23/16 1347    Education provided Yes   Education Details updated HEP to include KTC, STS, lumbar rotation, bridge, hip abduction and prone hip extension.   Person(s) Educated Patient   Methods Explanation;Demonstration;Tactile cues;Verbal cues;Handout   Comprehension Verbalized understanding;Returned demonstration;Verbal cues required;Tactile cues required          PT Short Term Goals - 01/23/16 1319       PT SHORT TERM GOAL #1   Title After 2 weeks pt will be independent in HEP to reduce symptoms at home and progress function.    Time 3   Period Weeks   Status Achieved     PT SHORT TERM GOAL #2   Title After 4 weeks patient will demonstrate 5 degrees of hip extension bil during thomas test to improve lumbosacral mechanics during gait for reduced pain with function.     Baseline -5 degrees extension bilat   Time 3   Period Weeks   Status Partially Met  now at neutral     PT SHORT TERM GOAL #3   Title After 4 weeks patient will demonstrate 4/5 strength in MMT of hip extension in prone bilat to improve strength for yard/house work activity.    Baseline 3-/5, range limited and painful   Time 3   Period Weeks   Status Partially Met  now 4-/5           PT Long Term Goals - 01/23/16 1324      PT LONG TERM GOAL #1   Title After 8 weeks pt will be independent in advanced HEP to progress function after DC.    Status Partially Met     PT LONG TERM GOAL #2   Title After 8 weeks patient will demonstrate 10 degrees of hip extension bil during thomas test to improve lumbosacral mechanics during gait for reduced pain with function.     Status Not Met     PT LONG TERM GOAL #3   Title After 8 weeks patient will demonstrate 4+/5 strength in MMT of hip extension in prone bilat to improve strength for yard/house work activity.    Status Not Met               Plan - 01/23/16 1425    Clinical Impression Statement Pt comes to therapy today requesting discharge from skilled services.  Muscles retested and found to have improvments in Rt hip abductor and bilateral hip extensors.  Pt partially met most STG's and unable to meet LTG's as has only completed 4 sessions.   HEP updated to include exericses to help improve all remaining deficits.  pt without questions or concerns.   Rehab Potential Excellent   PT Frequency 2x / week   PT Duration 4 weeks   PT Treatment/Interventions ADLs/Self  Care Home Management;Cryotherapy;Gait training;Stair training;Functional mobility training;Therapeutic activities;Therapeutic exercise;Balance training;Neuromuscular re-education;Patient/family education;Manual techniques   PT Next Visit Plan discharge per pateint request.   PT Home Exercise Plan updated (see pt education)  Consulted and Agree with Plan of Care Patient      Patient will benefit from skilled therapeutic intervention in order to improve the following deficits and impairments:  Abnormal gait, Decreased activity tolerance, Decreased balance, Decreased mobility, Decreased strength, Decreased range of motion, Increased muscle spasms, Pain, Improper body mechanics, Postural dysfunction     2016-02-06 1405  PT G-Codes  Functional Assessment Tool Used Clinical Judgment  Functional Limitation Mobility: Walking and moving around  Mobility: Walking and Moving Around Current Status (X0379) CI  Mobility: Walking and Moving Around Goal Status (D5831) CI  Mobility: Walking and Moving Around Discharge Status [phone]) CI     Visit Diagnosis: Pain in left hip  Abnormal posture  Other muscle spasm     Problem List Patient Active Problem List   Diagnosis Date Noted  . GERD (gastroesophageal reflux disease) 06/27/2013  . H/O: upper GI bleed 06/27/2013  . Epigastric pain 06/27/2013  . 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 [place], [place], [address] Phone: [phone]   Fax:  [phone]  Name: [name] MRN: [id number] Date of Birth: 07-01-1945   10:01 AM, 01/29/16 Etta Grandchild, PT, DPT Physical Therapist - [place] [phone] (mobile)

## 2016-01-23 NOTE — Patient Instructions (Signed)
ABDUCTION: Supine using resistive band    Lie on back, legs straight. Draw right leg out to the side as far as possible. Complete _10_ repetitions. Perform _2_ sessions per day.   Bridge    Lie back, legs bent. Inhale, pressing hips up. Repeat _10_ times. Do __2_ sessions per day.   Knee Roll    Lying on back, with knees bent and feet flat on floor, arms outstretched to sides, slowly roll both knees to side, hold 5 seconds. Back to starting position, hold 5 seconds. Then to opposite side, hold 5 seconds. Return to starting position. Keep shoulders and arms in contact with floor.   Functional Quadriceps: Sit to Stand    Sit on edge of chair, feet flat on floor. Stand upright, extending knees fully. Repeat _10__ times per set.  Do _2_ sessions per day.    HIP / KNEE: Extension - Prone    Squeeze glutes. Raise leg up. Keep knee straight. _10__ reps per set, _2__ sets per day

## 2016-01-28 ENCOUNTER — Encounter (HOSPITAL_COMMUNITY): Payer: Commercial Managed Care - HMO | Admitting: Physical Therapy

## 2016-01-29 ENCOUNTER — Encounter: Payer: Self-pay | Admitting: Orthopedic Surgery

## 2016-01-29 ENCOUNTER — Ambulatory Visit (INDEPENDENT_AMBULATORY_CARE_PROVIDER_SITE_OTHER): Payer: Commercial Managed Care - HMO | Admitting: Orthopedic Surgery

## 2016-01-29 VITALS — BP 135/70 | HR 111 | Wt 159.0 lb

## 2016-01-29 DIAGNOSIS — M545 Low back pain, unspecified: Secondary | ICD-10-CM

## 2016-01-29 DIAGNOSIS — M25552 Pain in left hip: Secondary | ICD-10-CM

## 2016-01-29 NOTE — Progress Notes (Signed)
FOLLOW UP   Initial presentation with left hip pain found to have left-sided low back pain with out sciatica. We sent her to physical therapy. Her x-ray showed degenerative disc disease L5-S1 with mild spondylolisthesis at L4-S1  She has made significant improvement with decreased pain  She's not having any neuropathic symptoms no numbness no tingling no bowel problems no bladder function issue  The spine is nontender today she has excellent range of motion in her hip and knee on right and left leg with normal strength in both legs and normal sensation in both legs  Impression Encounter Diagnoses  Name Primary?  . Left hip pain Yes  . Left-sided low back pain without sciatica      Follow-up as needed

## 2016-01-30 ENCOUNTER — Encounter (HOSPITAL_COMMUNITY): Payer: Commercial Managed Care - HMO

## 2016-02-05 ENCOUNTER — Encounter (HOSPITAL_COMMUNITY): Payer: Commercial Managed Care - HMO

## 2016-02-07 ENCOUNTER — Encounter (HOSPITAL_COMMUNITY): Payer: Commercial Managed Care - HMO

## 2016-02-12 ENCOUNTER — Encounter (HOSPITAL_COMMUNITY): Payer: Commercial Managed Care - HMO

## 2016-02-14 ENCOUNTER — Encounter (HOSPITAL_COMMUNITY): Payer: Commercial Managed Care - HMO

## 2016-02-19 ENCOUNTER — Encounter (HOSPITAL_COMMUNITY): Payer: Commercial Managed Care - HMO

## 2016-02-21 ENCOUNTER — Encounter (HOSPITAL_COMMUNITY): Payer: Commercial Managed Care - HMO

## 2016-02-26 ENCOUNTER — Encounter (HOSPITAL_COMMUNITY): Payer: Commercial Managed Care - HMO

## 2016-02-28 ENCOUNTER — Encounter (HOSPITAL_COMMUNITY): Payer: Commercial Managed Care - HMO

## 2016-03-12 DIAGNOSIS — M25511 Pain in right shoulder: Secondary | ICD-10-CM | POA: Diagnosis not present

## 2016-03-12 DIAGNOSIS — R609 Edema, unspecified: Secondary | ICD-10-CM | POA: Diagnosis not present

## 2016-03-12 DIAGNOSIS — R000 Tachycardia, unspecified: Secondary | ICD-10-CM | POA: Diagnosis not present

## 2016-03-19 DIAGNOSIS — M25511 Pain in right shoulder: Secondary | ICD-10-CM | POA: Diagnosis not present

## 2016-03-19 DIAGNOSIS — R000 Tachycardia, unspecified: Secondary | ICD-10-CM | POA: Diagnosis not present

## 2016-03-19 DIAGNOSIS — R609 Edema, unspecified: Secondary | ICD-10-CM | POA: Diagnosis not present

## 2016-03-24 ENCOUNTER — Ambulatory Visit: Payer: Commercial Managed Care - HMO | Admitting: Orthopedic Surgery

## 2016-03-26 DIAGNOSIS — R600 Localized edema: Secondary | ICD-10-CM | POA: Diagnosis not present

## 2016-04-09 ENCOUNTER — Encounter: Payer: Self-pay | Admitting: Orthopedic Surgery

## 2016-04-09 ENCOUNTER — Ambulatory Visit (INDEPENDENT_AMBULATORY_CARE_PROVIDER_SITE_OTHER): Payer: Commercial Managed Care - HMO | Admitting: Orthopedic Surgery

## 2016-04-09 ENCOUNTER — Ambulatory Visit (INDEPENDENT_AMBULATORY_CARE_PROVIDER_SITE_OTHER): Payer: Commercial Managed Care - HMO

## 2016-04-09 VITALS — BP 147/78 | HR 111 | Wt 164.0 lb

## 2016-04-09 DIAGNOSIS — M7551 Bursitis of right shoulder: Secondary | ICD-10-CM | POA: Diagnosis not present

## 2016-04-09 DIAGNOSIS — I878 Other specified disorders of veins: Secondary | ICD-10-CM

## 2016-04-09 DIAGNOSIS — M25512 Pain in left shoulder: Secondary | ICD-10-CM | POA: Diagnosis not present

## 2016-04-09 DIAGNOSIS — G8929 Other chronic pain: Secondary | ICD-10-CM

## 2016-04-09 DIAGNOSIS — M75102 Unspecified rotator cuff tear or rupture of left shoulder, not specified as traumatic: Secondary | ICD-10-CM

## 2016-04-09 NOTE — Patient Instructions (Signed)
You have received an injection of steroids into the joint. 15% of patients will have increased pain within the 24 hours postinjection.   This is transient and will go away.   We recommend that you use ice packs on the injection site for 20 minutes every 2 hours and extra strength Tylenol 2 tablets every 8 as needed until the pain resolves.  If you continue to have pain after taking the Tylenol and using the ice please call the office for further instructions.   Shoulder exercises 3-4 times a day 6 weeks   He will be scheduled to see Dr. Rosana Hoes for right leg swelling

## 2016-04-09 NOTE — Progress Notes (Signed)
Chief Complaint  Patient presents with  . Shoulder Pain    left shoulder pain   HPI-70 years old FEMALE presents with atraumatic onset of moderate throbbing aching left shoulder pain for several months with popping and locking up decreased range of motion pain at night  Posttraumatic pain anterior right shoulder after fall last spring  Presents with no history of treatment  Review of Systems  Constitutional: Negative for chills, fever and weight loss.  Respiratory: Negative for shortness of breath.   Cardiovascular: Negative for chest pain.  Neurological: Negative for tingling.    Past Medical History:  Diagnosis Date  . Breast cancer ([place])    left breast/lumpectomy/chemo/rad 2003  . COPD (chronic obstructive pulmonary disease) ([place])   . Diverticulitis   . GERD (gastroesophageal reflux disease)   . Hemoptysis   . Hyperlipidemia   . Iron deficiency   . Pulmonary embolism ([place])   . Tachycardia     Past Surgical History:  Procedure Laterality Date  . BACK SURGERY    . BREAST LUMPECTOMY    . COLONOSCOPY  01/28/2012   Procedure: COLONOSCOPY;  Surgeon: Rogene Houston, MD;  [facility];  Service: Endoscopy;  Laterality: N/A;  1200  . COLONOSCOPY  01-2012  . ESOPHAGOGASTRODUODENOSCOPY N/A 07/01/2013   Procedure: ESOPHAGOGASTRODUODENOSCOPY (EGD);  Surgeon: Rogene Houston, MD;  Location[facility];  Service: Endoscopy;  Laterality: N/A;  1:10  . LAPAROSCOPIC TUBAL LIGATION  1970  . TONSILLECTOMY     Patient was age 63   Family History  Problem Relation Age of Onset  . Heart disease Mother   . Alzheimer's disease Mother   . Heart disease Father   . Lung disease Father   . Healthy Son    Social History  Substance Use Topics  . Smoking status: Current Every Day Smoker    Packs/day: 0.50    Years: 30.00    Types: Cigarettes  . Smokeless tobacco: Never Used  . Alcohol use No   Current Meds  Medication Sig  . aspirin 81 MG tablet Take 81 mg by mouth  daily.    Marland Kitchen CALCIUM PO Take 1 tablet by mouth daily.  . Cholecalciferol (VITAMIN D PO) Take 1 tablet by mouth daily.  . diclofenac (VOLTAREN) 75 MG EC tablet Take 75 mg by mouth. Patient states that she takes 1 by mouth every other day  . dorzolamide-timolol (COSOPT) 22.3-6.8 MG/ML ophthalmic solution Place 1 drop into both eyes 2 (two) times daily.   Marland Kitchen esomeprazole (NEXIUM) 20 MG capsule Take 20 mg by mouth daily at 12 noon.  . Multiple Vitamins-Minerals (PRESERVISION AREDS 2) CAPS Take by mouth 2 (two) times daily.   . Omega-3 Fatty Acids (FISH OIL) 1000 MG CAPS Take by mouth daily.  . Psyllium (METAMUCIL PO) Take by mouth 2 (two) times daily.  . Red Yeast Rice Extract (RED YEAST RICE PO) Take by mouth 2 (two) times daily.    BP (!) 147/78   Pulse (!) 111   Wt 164 lb (74.4 kg)   BMI 24.57 kg/m   Physical Exam Gen. appearance is normal. She is oriented 3. Her mood and affect are normal. Her gait and station are unremarkable and normal heel to toe without a limp and no assistive device   Ortho Exam  Left shoulder tenderness around the acromion. Active range of motion 100 of straight abduction, external rotation is 50 at her side  Passive flexion is full and painful throughout the arc  of motion. She stable in abduction external rotation inferior subluxation test was normal, posterior stress test was normal. Rotator cuff strength was grade 5  Skin was normal. Chest good pulse normal temperature. Lymph nodes in the left axillary region were nonpalpable. Sensation was normal  At the anterior before meals joint she was tender on the right with full range of motion normal strength in the rotator cuff full range of motion.  Right leg and ankle showed mild peripheral edema tenderness some mild redness some mild skin discoloration poor pulses   ASSESSMENT: My personal interpretation of the images:  Plain films show mild proximal migration of the humeral head  Encounter Diagnoses   Name Primary?  . Chronic left shoulder pain Yes  . Venous stasis of lower extremity   . Rotator cuff syndrome of left shoulder   . Bursitis of right shoulder     PLAN Injected left shoulder: Procedure note the subacromial injection shoulder left   Verbal consent was obtained to inject the  Left   Shoulder  Timeout was completed to confirm the injection site is a subacromial space of the  left  shoulder  Medication used Depo-Medrol 40 mg and lidocaine 1% 3 cc  Anesthesia was provided by ethyl chloride  The injection was performed in the left  posterior subacromial space. After pinning the skin with alcohol and anesthetized the skin with ethyl chloride the subacromial space was injected using a 20-gauge needle. There were no complications  Sterile dressing was applied.   [place] exercises for both shoulders  Arrange appointment to see Dr. Rosana Hoes for the venous stasis disease  Arther Abbott, MD 04/09/2016 11:27 AM  .meds

## 2016-04-15 ENCOUNTER — Ambulatory Visit: Payer: Commercial Managed Care - HMO | Admitting: Orthopedic Surgery

## 2016-04-16 ENCOUNTER — Other Ambulatory Visit (HOSPITAL_COMMUNITY): Payer: Self-pay | Admitting: Internal Medicine

## 2016-04-16 DIAGNOSIS — Z780 Asymptomatic menopausal state: Secondary | ICD-10-CM

## 2016-05-07 ENCOUNTER — Other Ambulatory Visit (HOSPITAL_COMMUNITY): Payer: Self-pay | Admitting: Surgery

## 2016-05-07 DIAGNOSIS — I87001 Postthrombotic syndrome without complications of right lower extremity: Secondary | ICD-10-CM | POA: Diagnosis not present

## 2016-05-07 DIAGNOSIS — I878 Other specified disorders of veins: Secondary | ICD-10-CM | POA: Diagnosis not present

## 2016-05-07 DIAGNOSIS — Z86711 Personal history of pulmonary embolism: Secondary | ICD-10-CM

## 2016-05-07 DIAGNOSIS — I872 Venous insufficiency (chronic) (peripheral): Secondary | ICD-10-CM

## 2016-05-09 ENCOUNTER — Ambulatory Visit (HOSPITAL_COMMUNITY)
Admission: RE | Admit: 2016-05-09 | Discharge: 2016-05-09 | Disposition: A | Discharge status: Home / Self Care | Payer: Commercial Managed Care - HMO | Source: Ambulatory Visit | Attending: Surgery | Admitting: Surgery

## 2016-05-09 DIAGNOSIS — I872 Venous insufficiency (chronic) (peripheral): Secondary | ICD-10-CM | POA: Diagnosis not present

## 2016-05-09 DIAGNOSIS — Z86711 Personal history of pulmonary embolism: Secondary | ICD-10-CM

## 2016-05-09 DIAGNOSIS — M7989 Other specified soft tissue disorders: Secondary | ICD-10-CM | POA: Diagnosis not present

## 2016-05-13 ENCOUNTER — Encounter: Payer: Self-pay | Admitting: Family Medicine

## 2016-05-13 ENCOUNTER — Ambulatory Visit (INDEPENDENT_AMBULATORY_CARE_PROVIDER_SITE_OTHER): Payer: Commercial Managed Care - HMO | Admitting: Family Medicine

## 2016-05-13 VITALS — BP 138/74 | HR 92 | Temp 98.7°F | Resp 14 | Ht 69.0 in | Wt 162.0 lb

## 2016-05-13 DIAGNOSIS — M8580 Other specified disorders of bone density and structure, unspecified site: Secondary | ICD-10-CM | POA: Insufficient documentation

## 2016-05-13 DIAGNOSIS — E785 Hyperlipidemia, unspecified: Secondary | ICD-10-CM | POA: Insufficient documentation

## 2016-05-13 DIAGNOSIS — Z1239 Encounter for other screening for malignant neoplasm of breast: Secondary | ICD-10-CM

## 2016-05-13 DIAGNOSIS — H409 Unspecified glaucoma: Secondary | ICD-10-CM | POA: Diagnosis not present

## 2016-05-13 DIAGNOSIS — I495 Sick sinus syndrome: Secondary | ICD-10-CM

## 2016-05-13 DIAGNOSIS — K219 Gastro-esophageal reflux disease without esophagitis: Secondary | ICD-10-CM | POA: Diagnosis not present

## 2016-05-13 DIAGNOSIS — Z853 Personal history of malignant neoplasm of breast: Secondary | ICD-10-CM

## 2016-05-13 DIAGNOSIS — Z1231 Encounter for screening mammogram for malignant neoplasm of breast: Secondary | ICD-10-CM

## 2016-05-13 DIAGNOSIS — F17200 Nicotine dependence, unspecified, uncomplicated: Secondary | ICD-10-CM

## 2016-05-13 DIAGNOSIS — E782 Mixed hyperlipidemia: Secondary | ICD-10-CM | POA: Diagnosis not present

## 2016-05-13 DIAGNOSIS — M8589 Other specified disorders of bone density and structure, multiple sites: Secondary | ICD-10-CM

## 2016-05-13 DIAGNOSIS — Z23 Encounter for immunization: Secondary | ICD-10-CM | POA: Diagnosis not present

## 2016-05-13 DIAGNOSIS — R32 Unspecified urinary incontinence: Secondary | ICD-10-CM | POA: Insufficient documentation

## 2016-05-13 DIAGNOSIS — N393 Stress incontinence (female) (male): Secondary | ICD-10-CM

## 2016-05-13 NOTE — Assessment & Plan Note (Signed)
Will just monitor for now

## 2016-05-13 NOTE — Assessment & Plan Note (Signed)
Continue PPI ?

## 2016-05-13 NOTE — Progress Notes (Signed)
Subjective:    Patient ID: [name], female    DOB: December 08, 1945, 70 y.o.   MRN: [id number]  Patient presents for [facility] (is not fasting) Patient here to establish care she is previously followed by Dr. Nevada Crane in [place]  She has quite significant past medical history. She is followed by gastroenterology she has history of perforated bowel after colonoscopy she's also had episodes of reflux with hematemesis. She is last had an EGD in 2015 colonoscopy is all up-to-date. She is now on proton pump inhibitor does not have any difficulties.  She's been having difficulty with swelling and foot pain for the past few months. She was eventually seen by orthopedics who referred her to vascular for concern for peripheral vascular disease. She recent ultrasound she has follow-up with them in January. She's currently using compression hose which has helped. Note she was treated for possible gout as well as cellulitis with no improvement in her symptoms and her previous primary care provider.  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 Baird Cancer  She is history of osteopenia in the past she was on Fosamax but took for a very short period of time concerned about side effects. She is now on calcium and vitamin D she is due for repeat bone density.  History of urinary incontinence feels  like she cannot hold her bladder at all. She's not been tried on many bladder medications that I can tell but wanted to see urology over the past couple years but has not been referred there per report.  Orthopedics currently seen Dr. Andria Frames has also had intermittant dizzy spells trying to come off her tobacco , ? History of COPD   Review Of Systems:  GEN- denies fatigue, fever, weight loss,weakness, recent illness HEENT- denies eye drainage, change in vision, nasal discharge, CVS- denies chest pain, palpitations RESP- denies SOB, cough, wheeze ABD- denies N/V, change in stools, abd pain GU- denies dysuria, hematuria, dribbling, +incontinence MSK-+ joint pain, muscle aches, injury Neuro- denies headache,+ dizziness, syncope, seizure activity       Objective:    BP 138/74 (BP Location: Left Arm, Patient Position: Sitting, Cuff Size: Large)   Pulse 92   Temp 98.7 F (37.1 C) (Oral)   Resp 14   Ht 5\' 9"  (1.753 m)   Wt 162 lb (73.5 kg)   SpO2 98%   BMI 23.92 kg/m  GEN- NAD, alert and oriented x3 HEENT- PERRL, EOMI, non injected sclera, pink conjunctiva, MMM, oropharynx clear Neck- Supple, no thyromegaly CVS- RRR, no murmur RESP-CTAB ABD-NABS,soft,NT,ND EXT-bilat pedal  Edema Neuro-CNII-XII intact, no deficits  Pulses- Radial, DP- 2+        Assessment & Plan:      Obtain records- 45 minutes spent with pt, multiple issues  Problem List Items Addressed This Visit  Urinary incontinence    Refer to urology, likley has some pelvic floor dysfunction with age, possible bladder prolapse       Tobacco use disorder    counsled on cessation, trying nicotine gum       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[place])    Will just monitor for now       Relevant Orders   CBC with Differential/Platelet   Comprehensive metabolic panel   Osteopenia    Continue calcium and Vitamin D  Bone density to be done       Relevant Orders   Vitamin D, 25-hydroxy   DG Bone Density    Hyperlipidemia    Fasting labs to be done in January given lab slip  Continue supplements       Relevant Orders   Lipid panel   History of left breast cancer   Relevant Orders   MS DIGITAL SCREENING TOMO BILATERAL   Glaucoma   GERD (gastroesophageal reflux disease) - Primary    Continue PPI       Other Visit Diagnoses    Breast cancer screening       Relevant Orders   MS DIGITAL SCREENING TOMO BILATERAL      Note: This dictation was prepared with Dragon dictation along with smaller phrase technology. Any transcriptional errors that result from this process are unintentional.

## 2016-05-13 NOTE — Assessment & Plan Note (Signed)
Fasting labs to be done in January given lab slip  Continue supplements

## 2016-05-13 NOTE — Assessment & Plan Note (Signed)
Refer to urology, likley has some pelvic floor dysfunction with age, possible bladder prolapse

## 2016-05-13 NOTE — Patient Instructions (Addendum)
Bone Density to be done Mammogram to be done  Flu shot given  Get labs in January  Release of records- Dr. Nevada Crane [facility])  , Dr. Rosana Hoes ( vascular surgery)  Urology referral  Work on smoking F/U 2 months in office for recheck -30 minute slot

## 2016-05-13 NOTE — Assessment & Plan Note (Signed)
Continue calcium and Vitamin D  Bone density to be done

## 2016-05-13 NOTE — Assessment & Plan Note (Signed)
counsled on cessation, trying nicotine gum

## 2016-05-14 ENCOUNTER — Other Ambulatory Visit: Payer: Self-pay | Admitting: Family Medicine

## 2016-05-14 DIAGNOSIS — M8589 Other specified disorders of bone density and structure, multiple sites: Secondary | ICD-10-CM

## 2016-05-15 ENCOUNTER — Telehealth: Payer: Self-pay | Admitting: Family Medicine

## 2016-05-15 NOTE — Telephone Encounter (Signed)
Seeing Dr Katy Fitch today for Glaucoma H40.9.  [place] Rossetti auth # [id number] for 6 visits from 05/15/16 - 11/11/16.

## 2016-05-22 ENCOUNTER — Ambulatory Visit (HOSPITAL_COMMUNITY): Payer: Commercial Managed Care - HMO

## 2016-05-30 ENCOUNTER — Telehealth: Payer: Self-pay

## 2016-05-30 NOTE — Telephone Encounter (Signed)
[facility] called, unable to reach patient. Called spouse, made aware. Spouse was out driving said he would c/b to get [facility] info.

## 2016-06-04 ENCOUNTER — Other Ambulatory Visit: Payer: Self-pay | Admitting: Family Medicine

## 2016-06-04 DIAGNOSIS — Z1231 Encounter for screening mammogram for malignant neoplasm of breast: Secondary | ICD-10-CM

## 2016-06-09 ENCOUNTER — Ambulatory Visit (HOSPITAL_COMMUNITY)
Admission: RE | Admit: 2016-06-09 | Discharge: 2016-06-09 | Disposition: A | Discharge status: Home / Self Care | Payer: Commercial Managed Care - HMO | Source: Ambulatory Visit | Attending: Family Medicine | Admitting: Family Medicine

## 2016-06-09 ENCOUNTER — Ambulatory Visit (HOSPITAL_COMMUNITY): Payer: Commercial Managed Care - HMO

## 2016-06-09 DIAGNOSIS — Z1231 Encounter for screening mammogram for malignant neoplasm of breast: Secondary | ICD-10-CM | POA: Insufficient documentation

## 2016-06-09 DIAGNOSIS — M8589 Other specified disorders of bone density and structure, multiple sites: Secondary | ICD-10-CM

## 2016-06-09 DIAGNOSIS — M85832 Other specified disorders of bone density and structure, left forearm: Secondary | ICD-10-CM | POA: Insufficient documentation

## 2016-06-09 DIAGNOSIS — M85851 Other specified disorders of bone density and structure, right thigh: Secondary | ICD-10-CM | POA: Diagnosis not present

## 2016-06-24 DIAGNOSIS — I878 Other specified disorders of veins: Secondary | ICD-10-CM | POA: Diagnosis not present

## 2016-06-27 ENCOUNTER — Encounter: Payer: Self-pay | Admitting: Family Medicine

## 2016-06-27 ENCOUNTER — Ambulatory Visit (INDEPENDENT_AMBULATORY_CARE_PROVIDER_SITE_OTHER): Payer: Medicare HMO | Admitting: Family Medicine

## 2016-06-27 VITALS — BP 136/70 | HR 104 | Temp 98.7°F | Resp 14 | Ht 69.0 in | Wt 166.0 lb

## 2016-06-27 DIAGNOSIS — I495 Sick sinus syndrome: Secondary | ICD-10-CM | POA: Diagnosis not present

## 2016-06-27 MED ORDER — METOPROLOL SUCCINATE ER 25 MG PO TB24: 12.5000 mg | ORAL_TABLET | Freq: Every day | ORAL | 3 refills | Status: DC

## 2016-06-27 NOTE — Assessment & Plan Note (Signed)
Recurrent tachycardia with some palpitations Recommend she start low dose metoprolol 12.5mg  once a day  She will get her fasting labs done this weekend as well She will check BP at home She will likley need cardiology f/u but took some convincing for her to start medications today F/U in office in a few weeks

## 2016-06-27 NOTE — Patient Instructions (Signed)
F/U 4 weeks Start the metoprolol 12.5mg  once a day  Get the labs done

## 2016-06-27 NOTE — Progress Notes (Signed)
   Subjective:    Patient ID: [name], female    DOB: May 19, 1946, 71 y.o.   MRN: [id number]  Patient presents for BP Issues (x1 year- states that her normal BP has increased from 100's to 130's and she is concerned- also states that HR is higher )     Patient here worried about her blood pressure and heart rate. Her blood pressures been in the 130s for the past few months she's concerned because her heart rate is also coming down. When she was at the orthopedic office her heart rate was in the 1 teens she was at vascular last week her heart rate was 110 she has occasionally felt some palpitations no real chest pain or shortness of breath. Occasionally has dizziness but not sure it was related or not. In the past she's had tachybradycardia syndrome she was on Cardizem but states that her heart rate was never any lower than 90 and she felt bad on the medication therefore she ended up discontinuing this. She was last seen by cardiology in 2012    Review Of Systems:  GEN- denies fatigue, fever, weight loss,weakness, recent illness HEENT- denies eye drainage, change in vision, nasal discharge, CVS- denies chest pain, palpitations RESP- denies SOB, cough, wheeze ABD- denies N/V, change in stools, abd pain GU- denies dysuria, hematuria, dribbling, incontinence MSK- denies joint pain, muscle aches, injury Neuro- denies headache, dizziness, syncope, seizure activity       Objective:    BP 136/70 (BP Location: Left Arm, Patient Position: Sitting, Cuff Size: Normal)   Pulse (!) 104   Temp 98.7 F (37.1 C) (Oral)   Resp 14   Ht 5\' 9"  (1.753 m)   Wt 166 lb (75.3 kg)   SpO2 93%   BMI 24.51 kg/m  GEN- NAD, alert and oriented x3 HEENT- PERRL, EOMI, non injected sclera, pink conjunctiva, MMM, oropharynx clear Neck- Supple, no thyromegaly CVS- tachycardia, no murmur RESP-CTAB ABD-NABS,soft,NT,ND EXT- No edema Pulses- Radial  2+   EKG-NSR , HR 99     Assessment & Plan:       Problem List Items Addressed This Visit    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 Any transcriptional errors that result from this process are unintentional.

## 2016-07-03 DIAGNOSIS — M8589 Other specified disorders of bone density and structure, multiple sites: Secondary | ICD-10-CM | POA: Diagnosis not present

## 2016-07-03 DIAGNOSIS — E782 Mixed hyperlipidemia: Secondary | ICD-10-CM | POA: Diagnosis not present

## 2016-07-03 DIAGNOSIS — I495 Sick sinus syndrome: Secondary | ICD-10-CM | POA: Diagnosis not present

## 2016-07-03 LAB — CBC WITH DIFFERENTIAL/PLATELET
BASOS ABS: 87 {cells}/uL (ref 0–200)
Basophils Relative: 1 %
EOS PCT: 3 %
Eosinophils Absolute: 261 cells/uL (ref 15–500)
HCT: 39.3 % (ref 35.0–45.0)
Hemoglobin: 13.1 g/dL (ref 12.0–15.0)
Lymphocytes Relative: 32 %
Lymphs Abs: 2784 cells/uL (ref 850–3900)
MCH: 31 pg (ref 27.0–33.0)
MCHC: 33.3 g/dL (ref 32.0–36.0)
MCV: 93.1 fL (ref 80.0–100.0)
MPV: 8.7 fL (ref 7.5–12.5)
Monocytes Absolute: 609 cells/uL (ref 200–950)
Monocytes Relative: 7 %
Neutro Abs: 4959 cells/uL (ref 1500–7800)
Neutrophils Relative %: 57 %
Platelets: 382 10*3/uL (ref 140–400)
RBC: 4.22 MIL/uL (ref 3.80–5.10)
RDW: 14.3 % (ref 11.0–15.0)
WBC: 8.7 10*3/uL (ref 3.8–10.8)

## 2016-07-03 LAB — COMPREHENSIVE METABOLIC PANEL
ALBUMIN: 4 g/dL (ref 3.6–5.1)
ALT: 16 U/L (ref 6–29)
AST: 16 U/L (ref 10–35)
Alkaline Phosphatase: 80 U/L (ref 33–130)
BUN: 11 mg/dL (ref 7–25)
CHLORIDE: 105 mmol/L (ref 98–110)
CO2: 29 mmol/L (ref 20–31)
CREATININE: 0.65 mg/dL (ref 0.60–0.93)
Calcium: 9.9 mg/dL (ref 8.6–10.4)
GLUCOSE: 94 mg/dL (ref 70–99)
Potassium: 5.4 mmol/L — ABNORMAL HIGH (ref 3.5–5.3)
SODIUM: 141 mmol/L (ref 135–146)
Total Bilirubin: 0.6 mg/dL (ref 0.2–1.2)
Total Protein: 6.7 g/dL (ref 6.1–8.1)

## 2016-07-03 LAB — LIPID PANEL
Cholesterol: 244 mg/dL — ABNORMAL HIGH (ref <200)
HDL: 60 mg/dL (ref >50)
LDL Cholesterol: 160 mg/dL — ABNORMAL HIGH (ref <100)
Total CHOL/HDL Ratio: 4.1 Ratio (ref <5.0)
Triglycerides: 121 mg/dL (ref <150)
VLDL: 24 mg/dL (ref <30)

## 2016-07-04 LAB — VITAMIN D 25 HYDROXY (VIT D DEFICIENCY, FRACTURES): VIT D 25 HYDROXY: 39 ng/mL (ref 30–100)

## 2016-07-08 DIAGNOSIS — H04123 Dry eye syndrome of bilateral lacrimal glands: Secondary | ICD-10-CM | POA: Diagnosis not present

## 2016-07-08 DIAGNOSIS — H35373 Puckering of macula, bilateral: Secondary | ICD-10-CM | POA: Diagnosis not present

## 2016-07-08 DIAGNOSIS — H353230 Exudative age-related macular degeneration, bilateral, stage unspecified: Secondary | ICD-10-CM | POA: Diagnosis not present

## 2016-07-08 DIAGNOSIS — H40053 Ocular hypertension, bilateral: Secondary | ICD-10-CM | POA: Diagnosis not present

## 2016-07-22 DIAGNOSIS — Z961 Presence of intraocular lens: Secondary | ICD-10-CM | POA: Diagnosis not present

## 2016-07-22 DIAGNOSIS — H26491 Other secondary cataract, right eye: Secondary | ICD-10-CM | POA: Diagnosis not present

## 2016-07-22 DIAGNOSIS — H353221 Exudative age-related macular degeneration, left eye, with active choroidal neovascularization: Secondary | ICD-10-CM | POA: Diagnosis not present

## 2016-07-22 DIAGNOSIS — H353133 Nonexudative age-related macular degeneration, bilateral, advanced atrophic without subfoveal involvement: Secondary | ICD-10-CM | POA: Diagnosis not present

## 2016-07-22 DIAGNOSIS — H3562 Retinal hemorrhage, left eye: Secondary | ICD-10-CM | POA: Diagnosis not present

## 2016-07-28 ENCOUNTER — Encounter: Payer: Self-pay | Admitting: Family Medicine

## 2016-07-28 ENCOUNTER — Ambulatory Visit (INDEPENDENT_AMBULATORY_CARE_PROVIDER_SITE_OTHER): Payer: Medicare HMO | Admitting: Family Medicine

## 2016-07-28 VITALS — BP 132/68 | HR 86 | Temp 98.6°F | Resp 16 | Ht 69.0 in | Wt 166.0 lb

## 2016-07-28 DIAGNOSIS — I495 Sick sinus syndrome: Secondary | ICD-10-CM

## 2016-07-28 DIAGNOSIS — E782 Mixed hyperlipidemia: Secondary | ICD-10-CM

## 2016-07-28 MED ORDER — METOPROLOL SUCCINATE ER 25 MG PO TB24: 25.0000 mg | ORAL_TABLET | Freq: Every day | ORAL | 3 refills | Status: DC

## 2016-07-28 NOTE — Progress Notes (Signed)
   Subjective:    Patient ID: [name], female    DOB: 1945-11-25, 71 y.o.   MRN: [id number]  Patient presents for 1 month F/U (is not fasting)    Patient never one-month follow-up on her tachycardia-bradycardia syndrome. She was started on metoprolol 12.5 mg her heart rate has still been in the upper 90s on average. Her blood pressure has ranged from 113 to 130s over 70s  HR 70- 114 in the morning in the evening 115 to 150/ 60-70's. Her heart rate in the evenings is typically 70-99 She had one low blood pressure 102/68 but states that she felt fine. She states her blood purchase typically on the lower end. She's not had any chest pain no palpitations. I reviewed her labs with her again at that bedside. She did have minimally elevated potassium want to make sure that this was not a trending factor for her but she brought me some old labs were she's had borderline high potassium for a while she is not sure if it was due to some other medication she had been on in the past even tried changing her diet her last potassium was actually normal 4.7 back in July 2017    Review Of Systems:  GEN- denies fatigue, fever, weight loss,weakness, recent illness HEENT- denies eye drainage, change in vision, nasal discharge, CVS- denies chest pain, palpitations RESP- denies SOB, cough, wheeze ABD- denies N/V, change in stools, abd pain GU- denies dysuria, hematuria, dribbling, incontinence MSK- denies joint pain, muscle aches, injury Neuro- denies headache, dizziness, syncope, seizure activity       Objective:    BP 132/68   Pulse 86   Temp 98.6 F (37 C) (Oral)   Resp 16   Ht 5\' 9"  (1.753 m)   Wt 166 lb (75.3 kg)   SpO2 98%   BMI 24.51 kg/m  GEN- NAD, alert and oriented x3 HEENT- PERRL, EOMI, non injected sclera, pink conjunctiva, MMM, oropharynx clear Neck- Supple, no thyromegaly CVS- RRR, no murmur RESP-CTAB EXT- No edema Pulses- Radial 2+        Assessment & Plan:       Problem List Items Addressed This Visit    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 She will continue to monitor blood pressure as well as her heart rate. She has had these mildly elevated potassiums on and off I will put these records into the chart.      Relevant Medications   metoprolol succinate (TOPROL-XL) 25 MG 24 hr tablet   Hyperlipidemia - Primary    She changed her red yEast rice brand she can return to her previous as her cholesterol was much better Recheck at her next visit      Relevant Medications   metoprolol succinate (TOPROL-XL) 25 MG 24 hr tablet      Note: This dictation was prepared with Dragon dictation along with smaller phrase technology. Any transcriptional errors that result from this process are unintentional.

## 2016-07-28 NOTE — Patient Instructions (Signed)
F/U  3 months 

## 2016-07-29 ENCOUNTER — Encounter: Payer: Self-pay | Admitting: Family Medicine

## 2016-07-29 DIAGNOSIS — H353221 Exudative age-related macular degeneration, left eye, with active choroidal neovascularization: Secondary | ICD-10-CM | POA: Diagnosis not present

## 2016-07-29 DIAGNOSIS — H353112 Nonexudative age-related macular degeneration, right eye, intermediate dry stage: Secondary | ICD-10-CM | POA: Diagnosis not present

## 2016-07-29 NOTE — Assessment & Plan Note (Addendum)
She changed her red yEast rice brand she can return to her previous as her cholesterol was much better Recheck at her next visit

## 2016-07-29 NOTE — Assessment & Plan Note (Signed)
We'll have her increase the metoprolol to 25 mg think her blood pressure can handle this. She will continue to monitor blood pressure as well as her heart rate. She has had these mildly elevated potassiums on and off I will put these records into the chart.

## 2016-08-15 ENCOUNTER — Ambulatory Visit (INDEPENDENT_AMBULATORY_CARE_PROVIDER_SITE_OTHER): Payer: 59 | Admitting: Urology

## 2016-08-15 DIAGNOSIS — N952 Postmenopausal atrophic vaginitis: Secondary | ICD-10-CM

## 2016-08-15 DIAGNOSIS — R350 Frequency of micturition: Secondary | ICD-10-CM

## 2016-08-15 DIAGNOSIS — R351 Nocturia: Secondary | ICD-10-CM | POA: Diagnosis not present

## 2016-08-15 DIAGNOSIS — N3941 Urge incontinence: Secondary | ICD-10-CM | POA: Diagnosis not present

## 2016-08-21 ENCOUNTER — Telehealth: Payer: Self-pay | Admitting: Family Medicine

## 2016-08-21 DIAGNOSIS — I878 Other specified disorders of veins: Secondary | ICD-10-CM

## 2016-08-21 DIAGNOSIS — I87001 Postthrombotic syndrome without complications of right lower extremity: Secondary | ICD-10-CM

## 2016-08-21 NOTE — Telephone Encounter (Signed)
Pt called stating that she was going to a vein and vascular specialist in [place] but the doctor has left the practice and they were suppose to refer her to a new specialist but have not done so yet. When she called them today they told her that her pcp would have to do the referral for her.

## 2016-08-22 NOTE — Telephone Encounter (Signed)
Pt ws being seen by Dr Rosana Hoes at surgical office in [place].  He has left that office and now needs to be seen in [place].  Dr Rosana Hoes had done some venous studies.  Was planning some procedure but now he is gone.  New referral into [facility]

## 2016-08-22 NOTE — Telephone Encounter (Signed)
Okay to refer? 

## 2016-08-22 NOTE — Telephone Encounter (Signed)
Ok to refer.

## 2016-09-09 DIAGNOSIS — H353221 Exudative age-related macular degeneration, left eye, with active choroidal neovascularization: Secondary | ICD-10-CM | POA: Diagnosis not present

## 2016-09-12 ENCOUNTER — Ambulatory Visit (INDEPENDENT_AMBULATORY_CARE_PROVIDER_SITE_OTHER): Payer: Medicare HMO | Admitting: Urology

## 2016-09-12 DIAGNOSIS — R351 Nocturia: Secondary | ICD-10-CM

## 2016-09-12 DIAGNOSIS — N3941 Urge incontinence: Secondary | ICD-10-CM | POA: Diagnosis not present

## 2016-09-29 DIAGNOSIS — R351 Nocturia: Secondary | ICD-10-CM | POA: Diagnosis not present

## 2016-09-29 DIAGNOSIS — N3941 Urge incontinence: Secondary | ICD-10-CM | POA: Diagnosis not present

## 2016-10-08 DIAGNOSIS — H353221 Exudative age-related macular degeneration, left eye, with active choroidal neovascularization: Secondary | ICD-10-CM | POA: Diagnosis not present

## 2016-10-08 DIAGNOSIS — H353112 Nonexudative age-related macular degeneration, right eye, intermediate dry stage: Secondary | ICD-10-CM | POA: Diagnosis not present

## 2016-10-17 ENCOUNTER — Encounter: Payer: Medicare HMO | Admitting: Vascular Surgery

## 2016-10-17 ENCOUNTER — Encounter (HOSPITAL_COMMUNITY): Payer: Medicare HMO

## 2016-10-17 ENCOUNTER — Encounter: Payer: Self-pay | Admitting: *Deleted

## 2016-10-21 ENCOUNTER — Other Ambulatory Visit: Payer: Self-pay

## 2016-10-21 DIAGNOSIS — I878 Other specified disorders of veins: Secondary | ICD-10-CM

## 2016-10-24 ENCOUNTER — Ambulatory Visit (INDEPENDENT_AMBULATORY_CARE_PROVIDER_SITE_OTHER): Payer: Medicare HMO | Admitting: Urology

## 2016-10-24 DIAGNOSIS — R351 Nocturia: Secondary | ICD-10-CM | POA: Diagnosis not present

## 2016-10-24 DIAGNOSIS — N311 Reflex neuropathic bladder, not elsewhere classified: Secondary | ICD-10-CM | POA: Diagnosis not present

## 2016-10-24 DIAGNOSIS — N3941 Urge incontinence: Secondary | ICD-10-CM

## 2016-10-28 ENCOUNTER — Ambulatory Visit (INDEPENDENT_AMBULATORY_CARE_PROVIDER_SITE_OTHER): Payer: Medicare HMO | Admitting: Family Medicine

## 2016-10-28 ENCOUNTER — Encounter: Payer: Self-pay | Admitting: Family Medicine

## 2016-10-28 VITALS — BP 120/64 | HR 112 | Temp 98.6°F | Resp 14 | Ht 69.0 in | Wt 165.0 lb

## 2016-10-28 DIAGNOSIS — I495 Sick sinus syndrome: Secondary | ICD-10-CM | POA: Diagnosis not present

## 2016-10-28 DIAGNOSIS — N393 Stress incontinence (female) (male): Secondary | ICD-10-CM | POA: Diagnosis not present

## 2016-10-28 MED ORDER — METOPROLOL SUCCINATE ER 25 MG PO TB24: 25.0000 mg | ORAL_TABLET | Freq: Every day | ORAL | 1 refills | Status: DC

## 2016-10-28 NOTE — Patient Instructions (Addendum)
Restart the metoprolol 12.5mg  daily  F/U 4 months for PHYSICAL

## 2016-10-28 NOTE — Assessment & Plan Note (Signed)
Little benefit with the medications Has long standing history of OB and overflow incontnence, concerned about SE Okay to stay off bladder meds, wears depends

## 2016-10-28 NOTE — Progress Notes (Signed)
   Subjective:    Patient ID: [name], female    DOB: Oct 31, 1945, 71 y.o.   MRN: [id number]  Patient presents for 3 month F/u (is not fasting) and Medication Review (quit taking metoprolol d/t pt thinks she does not need it) Patient here for follow-up chronic medical problems. Her last visit 3 months ago her metoprolol was increased to 25 mg due to her tachycardia. She has history of tachybradycardia syndrome. At that time I felt like her blood pressure was able to tolerate the higher dose of metoprolol. Hyperlipidemia she is still been taking red yeast rice and fish oil. Takes anti-inflammatories as needed for her arthritis  Was seen by urologist- given myrbetriq did not help, states he told her to decrease her metoprolol tpo 1/2 tablet but she just stopped taking it, her BP Has ranged 101-153/69-82, HR off meds 99-117. She then stated she  Keep reading things on the internet therefore stopped She also did not try the 2nd bladder med, some type of anti-cholinergic due to her glaucoma and wanted my opinion on this    Has appt tomorrow with vascular for her venous stasis/varicose veins   Review Of Systems:  GEN- denies fatigue, fever, weight loss,weakness, recent illness HEENT- denies eye drainage, change in vision, nasal discharge, CVS- denies chest pain, palpitations RESP- denies SOB, cough, wheeze ABD- denies N/V, change in stools, abd pain GU- denies dysuria, hematuria, dribbling, incontinence MSK- denies joint pain, muscle aches, injury Neuro- denies headache, dizziness, syncope, seizure activity       Objective:    BP 120/64   Pulse (!) 112   Temp 98.6 F (37 C) (Oral)   Resp 14   Ht 5\' 9"  (1.753 m)   Wt 165 lb (74.8 kg)   SpO2 98%   BMI 24.37 kg/m  GEN- NAD, alert and oriented x3 HEENT- PERRL, EOMI, non injected sclera, pink conjunctiva, MMM, oropharynx clear Neck- Supple, no thyromegaly CVS- tachycardic, HR 110 no murmur RESP-CTAB ABD-NABS,soft,NT,ND EXT- No  edema,varicose veins  Pulses- Radial, DP- 2+        Assessment & Plan:    Labs next visit    Problem List Items Addressed This Visit    Urinary incontinence    Little benefit with the medications Has long standing 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 Any transcriptional errors that result from this process are unintentional.

## 2016-10-28 NOTE — Assessment & Plan Note (Signed)
Restart metoprolol at 12.5mg  daily, discussed importance of the medication She does admit to palpitations at times

## 2016-10-29 ENCOUNTER — Ambulatory Visit (HOSPITAL_COMMUNITY)
Admission: RE | Admit: 2016-10-29 | Discharge: 2016-10-29 | Disposition: A | Discharge status: Home / Self Care | Payer: Medicare HMO | Source: Ambulatory Visit | Attending: Vascular Surgery | Admitting: Vascular Surgery

## 2016-10-29 ENCOUNTER — Encounter: Payer: Self-pay | Admitting: Vascular Surgery

## 2016-10-29 ENCOUNTER — Ambulatory Visit (INDEPENDENT_AMBULATORY_CARE_PROVIDER_SITE_OTHER): Payer: Medicare HMO | Admitting: Vascular Surgery

## 2016-10-29 VITALS — BP 121/75 | HR 97 | Temp 97.6°F | Resp 18 | Ht 63.0 in | Wt 161.2 lb

## 2016-10-29 DIAGNOSIS — I87303 Chronic venous hypertension (idiopathic) without complications of bilateral lower extremity: Secondary | ICD-10-CM | POA: Diagnosis not present

## 2016-10-29 DIAGNOSIS — I878 Other specified disorders of veins: Secondary | ICD-10-CM | POA: Diagnosis not present

## 2016-10-29 DIAGNOSIS — I8393 Asymptomatic varicose veins of bilateral lower extremities: Secondary | ICD-10-CM | POA: Insufficient documentation

## 2016-10-29 DIAGNOSIS — R609 Edema, unspecified: Secondary | ICD-10-CM | POA: Diagnosis present

## 2016-10-29 NOTE — Progress Notes (Signed)
[facility] [place]  Patient name: [name] MRN: [id number] DOB: Mar 28, 1946 Sex: female  REASON FOR CONSULT: Evaluation  of swelling and discomfort bilateral lower extremities right greater than left  HPI: [name] is a 71 y.o. female, who is seen today for evaluation of lower extremity swelling. She is very pleasant 71 year old female. Does have a history of pulmonary embolism in the past. She has had the progressive swelling more so on her right leg and her left leg. She does wear 20-30 mm knee-high graduated compression garments with some improvement. She is seen today for discussion of other potential options. She does not have any history of venous ulceration.  Past Medical History:  Diagnosis Date  . Breast cancer ([place])    left breast/lumpectomy/chemo/rad 2003  . COPD (chronic obstructive pulmonary disease) ([place])   . Diverticulitis   . GERD (gastroesophageal reflux disease)   . Hemoptysis   . Hyperlipidemia   . Iron deficiency   . Pulmonary embolism ([place])   . Tachycardia     Family History  Problem Relation Age of Onset  . Heart disease Mother   . Alzheimer's disease Mother   . Heart disease Father   . Lung disease Father   . Healthy Son     SOCIAL HISTORY: Social History   Social History  . Marital status: Married    Spouse name: N/A  . Number of children: N/A  . Years of education: N/A   Occupational History  . Not on file.   Social History Main Topics  . Smoking status: Former Smoker    Packs/day: 0.50    Years: 30.00    Types: Cigarettes    Quit date: 08/29/2016  . Smokeless tobacco: Never Used  . Alcohol use No  . Drug use: No  . Sexual activity: Not Currently   Other Topics Concern  . Not on file   Social History Narrative  . No narrative on file    Allergies  Allergen Reactions  . Sulfa Antibiotics Other (See Comments)    Childhood Allergy     Current Outpatient  Prescriptions  Medication Sig Dispense Refill  . aspirin 81 MG tablet Take 81 mg by mouth daily.      Marland Kitchen CALCIUM PO Take 1 tablet by mouth daily.    . Cholecalciferol (VITAMIN D PO) Take 1 tablet by mouth daily.    . diclofenac (VOLTAREN) 75 MG EC tablet Take 75 mg by mouth daily as needed. Patient states that she takes 1 by mouth every other day     . dorzolamide-timolol (COSOPT) 22.3-6.8 MG/ML ophthalmic solution Place 1 drop into both eyes 2 (two) times daily.     Marland Kitchen esomeprazole (NEXIUM) 20 MG capsule Take 20 mg by mouth daily at 12 noon.    . metoprolol succinate (TOPROL-XL) 25 MG 24 hr tablet Take 1 tablet (25 mg total) by mouth daily. (Patient taking differently: Take 12.5 mg by mouth daily. ) 90 tablet 1  . Multiple Vitamins-Minerals (PRESERVISION AREDS 2) CAPS Take by mouth 2 (two) times daily.     . Omega-3 Fatty Acids (FISH OIL) 1000 MG CAPS Take by mouth daily.    . Psyllium (METAMUCIL PO) Take by mouth 2 (two) times daily.    . Red Yeast Rice Extract (RED YEAST RICE PO) Take by mouth 2 (two) times daily.     No current facility-administered medications for this visit.     REVIEW OF SYSTEMS:  [X]   denotes positive finding, [ ]  denotes negative finding Cardiac  Comments:  Chest pain or chest pressure:    Shortness of breath upon exertion:    Short of breath when lying flat:    Irregular heart rhythm:        Vascular    Pain in calf, thigh, or hip brought on by ambulation: x   Pain in feet at night that wakes you up from your sleep:  x   Blood clot in your veins:    Leg swelling:  x       Pulmonary    Oxygen at home:    Productive cough:  x   Wheezing:         Neurologic    Sudden weakness in arms or legs:     Sudden numbness in arms or legs:     Sudden onset of difficulty speaking or slurred speech:    Temporary loss of vision in one eye:     Problems with dizziness:         Gastrointestinal    Blood in stool:     Vomited blood:         Genitourinary    Burning  when urinating:     Blood in urine:        Psychiatric    Major depression:         Hematologic    Bleeding problems:    Problems with blood clotting too easily:        Skin    Rashes or ulcers:        Constitutional    Fever or chills:      PHYSICAL EXAM: Vitals:   10/29/16 1318  BP: 121/75  Pulse: 97  Resp: 18  Temp: 97.6 F (36.4 C)  TempSrc: Oral  SpO2: 96%  Weight: 161 lb 3.2 oz (73.1 kg)  Height: 5\' 3"  (1.6 m)    GENERAL: The patient is a well-nourished female, in no acute distress. The vital signs are documented above. CARDIOVASCULAR: 2+ radial and 2+ dorsalis pedis pulses bilaterally. Carotid arteries without bruits. PULMONARY: There is good air exchange  ABDOMEN: Soft and non-tender  MUSCULOSKELETAL: There are no major deformities or cyanosis. NEUROLOGIC: No focal weakness or paresthesias are detected. SKIN: There are no ulcers or rashes noted. Does have swelling more so on her right leg and her left leg from her knee distally onto her foot. No evidence of hemosiderin deposit. Diffuse telangiectasia over both lower extremities more so on the left than on the right PSYCHIATRIC: The patient has a normal affect.  DATA:  Noninvasive studies today reveal reflux in her deep system bilaterally. Trivial reflux in her saphenous vein with no enlargement of her saphenous vein bilaterally  MEDICAL ISSUES: Discuss this. She has normal arterial flow. She has reflux mainly related to her deep venous system. I encouraged her to continue her use of graduated compression garments is much as possible. Also with elevation is much as possible. Splane no surgical options for deep venous reflux. She will see [place] again on as-needed basis   Rosetta Posner, MD [facility] Tel [phone] Pager [phone]

## 2016-10-30 DIAGNOSIS — H1852 Epithelial (juvenile) corneal dystrophy: Secondary | ICD-10-CM | POA: Diagnosis not present

## 2016-10-30 DIAGNOSIS — H16223 Keratoconjunctivitis sicca, not specified as Sjogren's, bilateral: Secondary | ICD-10-CM | POA: Diagnosis not present

## 2016-10-30 DIAGNOSIS — H04123 Dry eye syndrome of bilateral lacrimal glands: Secondary | ICD-10-CM | POA: Diagnosis not present

## 2016-10-30 DIAGNOSIS — H401134 Primary open-angle glaucoma, bilateral, indeterminate stage: Secondary | ICD-10-CM | POA: Diagnosis not present

## 2016-11-12 DIAGNOSIS — H353221 Exudative age-related macular degeneration, left eye, with active choroidal neovascularization: Secondary | ICD-10-CM | POA: Diagnosis not present

## 2016-11-12 DIAGNOSIS — H353112 Nonexudative age-related macular degeneration, right eye, intermediate dry stage: Secondary | ICD-10-CM | POA: Diagnosis not present

## 2016-11-14 ENCOUNTER — Encounter: Payer: Self-pay | Admitting: Family Medicine

## 2016-11-14 ENCOUNTER — Ambulatory Visit (INDEPENDENT_AMBULATORY_CARE_PROVIDER_SITE_OTHER): Payer: Medicare HMO | Admitting: Family Medicine

## 2016-11-14 VITALS — BP 128/78 | HR 98 | Temp 98.7°F | Resp 14 | Ht 63.0 in | Wt 160.0 lb

## 2016-11-14 DIAGNOSIS — B372 Candidiasis of skin and nail: Secondary | ICD-10-CM | POA: Diagnosis not present

## 2016-11-14 MED ORDER — CLOTRIMAZOLE-BETAMETHASONE 1-0.05 % EX CREA: 1.0000 "application " | TOPICAL_CREAM | Freq: Two times a day (BID) | CUTANEOUS | 1 refills | Status: DC

## 2016-11-14 NOTE — Progress Notes (Signed)
   Subjective:    Patient ID: [name], female    DOB: [birth year], 71 y.o.   MRN: [id number]  Patient presents for Rash (itching and redness under breasts- states that she can feel bumps that are tender to touch- reports that she has been using Nystatin for yeast, but this feel sdifferent)     Review Of Systems:  GEN- denies fatigue, fever, weight loss,weakness, recent illness HEENT- denies eye drainage, change in vision, nasal discharge, CVS- denies chest pain, palpitations RESP- denies SOB, cough, wheeze ABD- denies N/V, change in stools, abd pain GU- denies dysuria, hematuria, dribbling, incontinence MSK- denies joint pain, muscle aches, injury Neuro- denies headache, dizziness, syncope, seizure activity       Objective:    BP 128/78   Pulse 98   Temp 98.7 F (37.1 C) (Oral)   Resp 14   Ht 5\' 3"  (1.6 m)   Wt 160 lb (72.6 kg)   SpO2 98%   BMI 28.34 kg/m  GEN- NAD, alert and oriented x3 Breast- normal symmetry, no nipple inversion,no nipple drainage, no nodules or lumps felt left breast previous surgical scar noted  Nodes- no axillary nodes Skin- erythema with few tiny papular lesions beneath left breast with moisture, no cracking of skin         Assessment & Plan:      Problem List Items Addressed This Visit    None    Visit Diagnoses    Candidiasis, intertrigo    -  Primary   Change to lotrisone, inflammation over the yeast, though very small area. Breast exam no nodules palpated, no axillary nodes no sign of cellulitis    Relevant Medications   clotrimazole-betamethasone (LOTRISONE) cream      Note: This dictation was prepared with Dragon dictation along with smaller phrase technology. Any transcriptional errors that result from this process are unintentional.

## 2016-11-14 NOTE — Patient Instructions (Signed)
Use the lotrisone twice a day until clear F/U as needed

## 2016-11-25 ENCOUNTER — Encounter (INDEPENDENT_AMBULATORY_CARE_PROVIDER_SITE_OTHER): Payer: Self-pay | Admitting: Internal Medicine

## 2016-12-17 ENCOUNTER — Telehealth: Payer: Self-pay

## 2016-12-17 NOTE — Telephone Encounter (Signed)
Patient called in complaining of abdominal pain, diarrhea, and vomiting. Patient states she has had these symptoms since 12/14/2016. Patient states she does not have an appetite denies having sharp pain. Patient states she has been able to eat a few saltine crackers, but find her self not being able to hold anything down. Patient states everytime she eats or drinks it cause her stomach to burn.  Patient states she does not have a lot of energy. Explained to patient to try and drink Gatorade,powerade, water, ginger ale, continue to eat saltine crackers, toast .Patient was advised to go to the ER to make sure she is not dehydrated and where she can be assessed for her abdominal pain,   Patient states she has an appointment on 7/20 with GI. Patient was informed to make sure she keep the appointment and to go to the ER if her symptoms do not improve. Patient does indicate a little relief today and indicates that she has experienced this back in 2011. Patient verbalizes understanding

## 2016-12-18 DIAGNOSIS — H35373 Puckering of macula, bilateral: Secondary | ICD-10-CM | POA: Diagnosis not present

## 2016-12-18 DIAGNOSIS — H353112 Nonexudative age-related macular degeneration, right eye, intermediate dry stage: Secondary | ICD-10-CM | POA: Diagnosis not present

## 2016-12-18 DIAGNOSIS — H401134 Primary open-angle glaucoma, bilateral, indeterminate stage: Secondary | ICD-10-CM | POA: Diagnosis not present

## 2016-12-18 DIAGNOSIS — H353221 Exudative age-related macular degeneration, left eye, with active choroidal neovascularization: Secondary | ICD-10-CM | POA: Diagnosis not present

## 2016-12-19 ENCOUNTER — Ambulatory Visit (INDEPENDENT_AMBULATORY_CARE_PROVIDER_SITE_OTHER): Payer: Medicare HMO | Admitting: Internal Medicine

## 2016-12-19 ENCOUNTER — Encounter (INDEPENDENT_AMBULATORY_CARE_PROVIDER_SITE_OTHER): Payer: Self-pay | Admitting: Internal Medicine

## 2016-12-19 VITALS — BP 140/62 | HR 60 | Temp 98.0°F | Ht 68.0 in | Wt 157.6 lb

## 2016-12-19 DIAGNOSIS — K219 Gastro-esophageal reflux disease without esophagitis: Secondary | ICD-10-CM

## 2016-12-19 MED ORDER — PANTOPRAZOLE SODIUM 40 MG PO TBEC: 40.0000 mg | DELAYED_RELEASE_TABLET | Freq: Every day | ORAL | 2 refills | Status: DC

## 2016-12-19 NOTE — Patient Instructions (Signed)
Protonix 40mg  daily x 30 days and then go back to the Nexium.

## 2016-12-19 NOTE — Progress Notes (Signed)
Subjective:    Patient ID: [name], female    DOB: 01-31-1946, 71 y.o.   MRN: [id number]  HPI  Here today for f/u. Last seen in July of 2017. Hx of GERD   Nexium. Present today with c/o that she had a burning sensation in her esophagus earlier this week and radiated into her abdomen. She took a Tums and she felt like it lodged. She felt she had a virus.  She says she had diarrhea and resolved Wednesday. There was no fever.  She was having 3-4 stools a day. Stools were watery and had a horrible smell. After that, she says she has some soreness in her abdomen. Stool are normal now.  07/01/2013: EGD  Indications: Patient is a 71 year old Caucasian female who presents with persistent symptoms of sore throat rash or burning epigastric discomfort and experienced an episode of hematemesis over 3 months ago. She's been on Nexium 20 mg daily without complete resolution of her symptoms. She believes her symptoms were triggered because of eyedrops.Impression: Erosive reflux esophagitis with small sliding hiatal hernia. Nonerosive gastroduodenitis.     Review of Systems Past Medical History:  Diagnosis Date  . Breast cancer ([place])    left breast/lumpectomy/chemo/rad 2003  . COPD (chronic obstructive pulmonary disease) ([place])   . Diverticulitis   . GERD (gastroesophageal reflux disease)   . Hemoptysis   . Hyperlipidemia   . Iron deficiency   . Pulmonary embolism ([place])   . Tachycardia     Past Surgical History:  Procedure Laterality Date  . BACK SURGERY    . BREAST LUMPECTOMY    . COLONOSCOPY  01/28/2012   Procedure: COLONOSCOPY;  Surgeon: Rogene Houston, MD;  Location: [facility];  Service: Endoscopy;  Laterality: N/A;  1200  . COLONOSCOPY  01-2012  . ESOPHAGOGASTRODUODENOSCOPY N/A 07/01/2013   Procedure: ESOPHAGOGASTRODUODENOSCOPY (EGD);  Surgeon: Rogene Houston, MD;  Location: [facility];  Service: Endoscopy;  Laterality: N/A;  1:10  . LAPAROSCOPIC TUBAL LIGATION  1970  . TONSILLECTOMY     Patient was age 69    Allergies  Allergen Reactions  . Sulfa Antibiotics Other (See Comments)    Childhood Allergy     Current Outpatient Prescriptions on File Prior to Visit  Medication Sig Dispense Refill  . aspirin 81 MG tablet Take 81 mg by mouth daily.      Marland Kitchen CALCIUM PO Take 1 tablet by mouth daily.    . Cholecalciferol (VITAMIN D PO) Take 1 tablet by mouth daily.    . clotrimazole-betamethasone (LOTRISONE) cream Apply 1 application topically 2 (two) times daily. 30 g 1  . diclofenac (VOLTAREN) 75 MG EC tablet Take 75 mg by mouth daily as needed. Patient states that she takes 1 by mouth every other day     . dorzolamide-timolol (COSOPT) 22.3-6.8 MG/ML ophthalmic solution Place 1 drop into both eyes 2 (two) times daily.     Marland Kitchen esomeprazole (NEXIUM) 20 MG capsule Take 20 mg by mouth daily at 12 noon.    . metoprolol succinate (TOPROL-XL) 25 MG 24 hr tablet Take 1 tablet (25 mg total) by mouth daily. (Patient taking differently: Take 12.5 mg by mouth daily. ) 90 tablet 1  . Multiple Vitamins-Minerals (PRESERVISION AREDS 2) CAPS Take by mouth 2 (two) times daily.     . Omega-3 Fatty Acids (FISH OIL) 1000 MG CAPS Take by mouth daily.    . Psyllium (METAMUCIL PO) Take by mouth 2 (two) times daily.    Marland Kitchen  Red Yeast Rice Extract (RED YEAST RICE PO) Take by mouth 2 (two) times daily.     No current facility-administered medications on file prior to visit.         Objective:   Physical Exam    Blood pressure 140/62, pulse 60, temperature 98 F (36.7 C), height 5\' 8"  (1.727 m), weight 157 lb 9.6 oz (71.5 kg). Alert and oriented. Skin warm and dry. Oral mucosa is moist.   . Sclera anicteric, conjunctivae is pink. Thyroid not enlarged. No cervical lymphadenopathy. Lungs clear. Heart regular rate and rhythm.  Abdomen is soft. Bowel sounds are positive. No  hepatomegaly. No abdominal masses felt. No tenderness.  No edema to lower extremities.            Assessment & Plan:  GERD Am going to start her on Protonix 40mg  x 1 months And then back to the Nexium.   OV in 1 year.

## 2017-01-13 DIAGNOSIS — H353221 Exudative age-related macular degeneration, left eye, with active choroidal neovascularization: Secondary | ICD-10-CM | POA: Diagnosis not present

## 2017-01-13 DIAGNOSIS — H353112 Nonexudative age-related macular degeneration, right eye, intermediate dry stage: Secondary | ICD-10-CM | POA: Diagnosis not present

## 2017-01-16 ENCOUNTER — Ambulatory Visit (INDEPENDENT_AMBULATORY_CARE_PROVIDER_SITE_OTHER): Payer: Medicare HMO | Admitting: Urology

## 2017-01-16 DIAGNOSIS — N311 Reflex neuropathic bladder, not elsewhere classified: Secondary | ICD-10-CM | POA: Diagnosis not present

## 2017-01-16 DIAGNOSIS — R351 Nocturia: Secondary | ICD-10-CM | POA: Diagnosis not present

## 2017-01-16 DIAGNOSIS — N3941 Urge incontinence: Secondary | ICD-10-CM | POA: Diagnosis not present

## 2017-01-23 DIAGNOSIS — H04203 Unspecified epiphora, bilateral lacrimal glands: Secondary | ICD-10-CM | POA: Diagnosis not present

## 2017-01-23 DIAGNOSIS — H16223 Keratoconjunctivitis sicca, not specified as Sjogren's, bilateral: Secondary | ICD-10-CM | POA: Diagnosis not present

## 2017-01-23 DIAGNOSIS — H401134 Primary open-angle glaucoma, bilateral, indeterminate stage: Secondary | ICD-10-CM | POA: Diagnosis not present

## 2017-01-23 DIAGNOSIS — H40052 Ocular hypertension, left eye: Secondary | ICD-10-CM | POA: Diagnosis not present

## 2017-02-25 DIAGNOSIS — H401134 Primary open-angle glaucoma, bilateral, indeterminate stage: Secondary | ICD-10-CM | POA: Diagnosis not present

## 2017-02-25 DIAGNOSIS — H353221 Exudative age-related macular degeneration, left eye, with active choroidal neovascularization: Secondary | ICD-10-CM | POA: Diagnosis not present

## 2017-02-25 DIAGNOSIS — H26491 Other secondary cataract, right eye: Secondary | ICD-10-CM | POA: Diagnosis not present

## 2017-02-25 DIAGNOSIS — H353112 Nonexudative age-related macular degeneration, right eye, intermediate dry stage: Secondary | ICD-10-CM | POA: Diagnosis not present

## 2017-03-02 ENCOUNTER — Ambulatory Visit (INDEPENDENT_AMBULATORY_CARE_PROVIDER_SITE_OTHER): Payer: Medicare HMO | Admitting: Family Medicine

## 2017-03-02 ENCOUNTER — Encounter: Payer: Self-pay | Admitting: Family Medicine

## 2017-03-02 VITALS — BP 120/70 | HR 90 | Temp 98.4°F | Resp 16 | Ht 68.0 in | Wt 159.0 lb

## 2017-03-02 DIAGNOSIS — Z853 Personal history of malignant neoplasm of breast: Secondary | ICD-10-CM

## 2017-03-02 DIAGNOSIS — F5101 Primary insomnia: Secondary | ICD-10-CM | POA: Diagnosis not present

## 2017-03-02 DIAGNOSIS — Z0000 Encounter for general adult medical examination without abnormal findings: Secondary | ICD-10-CM

## 2017-03-02 DIAGNOSIS — M8589 Other specified disorders of bone density and structure, multiple sites: Secondary | ICD-10-CM | POA: Diagnosis not present

## 2017-03-02 DIAGNOSIS — E782 Mixed hyperlipidemia: Secondary | ICD-10-CM

## 2017-03-02 LAB — COMPREHENSIVE METABOLIC PANEL
AG Ratio: 1.6 (calc) (ref 1.0–2.5)
ALBUMIN MSPROF: 4 g/dL (ref 3.6–5.1)
ALT: 11 U/L (ref 6–29)
AST: 14 U/L (ref 10–35)
Alkaline phosphatase (APISO): 86 U/L (ref 33–130)
BUN / CREAT RATIO: 22 (calc) (ref 6–22)
BUN: 13 mg/dL (ref 7–25)
CHLORIDE: 103 mmol/L (ref 98–110)
CO2: 32 mmol/L (ref 20–32)
CREATININE: 0.58 mg/dL — AB (ref 0.60–0.93)
Calcium: 9.8 mg/dL (ref 8.6–10.4)
GLOBULIN: 2.5 g/dL (ref 1.9–3.7)
GLUCOSE: 95 mg/dL (ref 65–99)
POTASSIUM: 5.6 mmol/L — AB (ref 3.5–5.3)
Sodium: 138 mmol/L (ref 135–146)
Total Bilirubin: 0.5 mg/dL (ref 0.2–1.2)
Total Protein: 6.5 g/dL (ref 6.1–8.1)

## 2017-03-02 LAB — CBC WITH DIFFERENTIAL/PLATELET
Basophils Absolute: 63 cells/uL (ref 0–200)
Basophils Relative: 0.8 %
EOS ABS: 253 {cells}/uL (ref 15–500)
Eosinophils Relative: 3.2 %
HCT: 36.8 % (ref 35.0–45.0)
Hemoglobin: 12.4 g/dL (ref 11.7–15.5)
Lymphs Abs: 2686 cells/uL (ref 850–3900)
MCH: 30.9 pg (ref 27.0–33.0)
MCHC: 33.7 g/dL (ref 32.0–36.0)
MCV: 91.8 fL (ref 80.0–100.0)
MPV: 9.3 fL (ref 7.5–12.5)
Monocytes Relative: 7.4 %
NEUTROS PCT: 54.6 %
Neutro Abs: 4313 cells/uL (ref 1500–7800)
PLATELETS: 369 10*3/uL (ref 140–400)
RBC: 4.01 10*6/uL (ref 3.80–5.10)
RDW: 13.1 % (ref 11.0–15.0)
TOTAL LYMPHOCYTE: 34 %
WBC: 7.9 10*3/uL (ref 3.8–10.8)
WBCMIX: 585 {cells}/uL (ref 200–950)

## 2017-03-02 LAB — LIPID PANEL
Cholesterol: 222 mg/dL — ABNORMAL HIGH (ref <200)
HDL: 56 mg/dL (ref >50)
LDL CHOLESTEROL (CALC): 139 mg/dL — AB
Non-HDL Cholesterol (Calc): 166 mg/dL (calc) — ABNORMAL HIGH (ref <130)
Total CHOL/HDL Ratio: 4 (calc) (ref <5.0)
Triglycerides: 144 mg/dL (ref <150)

## 2017-03-02 NOTE — Progress Notes (Signed)
Subjective:   Patient presents for Medicare Annual/Subsequent preventive examination.   Review Past Medical/Family/Social: Per EMR    Due for fasting labs    Has difficulty falling asleep, but is thinking about things at night ,typically reads at night     Risk Factors  Current exercise habits: walks  Dietary issues discussed: None, Weight is down 6 pounds she states that she had a stomach bug a few months ago and lost 10 pounds and she is gaining for visit back but states that her appetite is good. 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 No Over the past two weeks, have you felt little interest or pleasure in doing things? No Have you lost interest or pleasure in daily life? No Do you often feel hopeless? No Do you cry easily over simple problems? No   Activities of Daily Living  In your present state of health, do you have any difficulty performing the following activities?:  Driving? No  Managing money? No  Feeding yourself? No  Getting from bed to chair? No  Climbing a flight of stairs? No  Preparing food and eating?: No  Bathing or showering? No  Getting dressed: No  Getting to the toilet? No  Using the toilet:No  Moving around from place to place: No  In the past year have you fallen or had a near fall?:yes Are you sexually active? No  Do you have more than one partner? No   Hearing Difficulties:   Do you often ask people to speak up or repeat themselves? Yes Do you experience ringing or noises in your ears? No Do you have difficulty understanding soft or whispered voices? No  Do you feel that you have a problem with memory? No Do you often misplace items? No  Do you feel safe at home? Yes  Cognitive  Testing  Alert? Yes Normal Appearance?Yes  Oriented to person? Yes Place? Yes  Time? Yes  Recall of three objects? Yes  Can perform simple calculations? Yes  Displays appropriate judgment?Yes  Can read the correct time from a watch face?Yes   List the Names of Other Physician/Practitioners you currently use:  Dr. Herbert Deaner- Opthalmology GI- Dr. Laural Golden [facility]- Dr. Mcneil Sober any recent Medical Services you may have received from other than [facility] providers in the past year (date may be approximate).   Screening Tests / Date UTD  Colonoscopy                     Mammogram  PNEUMONIA- UTD Influenza Vaccine Due  Zostavax- done per patient   ROS: GEN- denies fatigue, fever, weight loss,weakness, recent illness HEENT- denies eye drainage, change in vision, nasal discharge, CVS- denies chest pain, palpitations RESP- denies SOB, cough, wheeze ABD- denies N/V, change in stools, abd pain GU- denies dysuria, hematuria, dribbling, incontinence MSK- denies joint pain, muscle aches, injury Neuro- denies headache, dizziness, syncope, seizure activity  PHYSICAL:  GEN- NAD, alert and oriented x3 HEENT- PERRL, EOMI, non injected sclera, pink conjunctiva, MMM, oropharynx clear Neck- Supple, no thryomegaly CVS- RRR, no murmur RESP-CTAB ABD-NABS,soft,NT,ND  EXT- No edema,varicose veins  Pulses- Radial, DP- 2+    Assessment:    Annual wellness medicare exam   Plan:  During the course of the visit the patient was educated and counseled about appropriate screening and preventive services including:  Flu shot given Screen Neg for depression. She does have some difficulty sleeping with insomnia. We discussed her trying melatonin over-the-counter.  Fasting labs to be obtained her heart rate looks good regarding her tachybradycardia syndrome  Hyperlipidemia help with cholesterol has improved she does use red yeast  rice and tries so watch her diet  She has living will   Diet  review for nutrition referral? Yes ____ Not Indicated __x__  Patient Instructions (the written plan) was given to the patient.  Medicare Attestation  I have personally reviewed:  The patient's medical and social history  Their use of alcohol, tobacco or illicit drugs  Their current medications and supplements  The patient's functional ability including ADLs,fall risks, home safety risks, cognitive, and hearing and visual impairment  Diet and physical activities  Evidence for depression or mood disorders  The patient's weight, height, BMI, and visual acuity have been recorded in the chart. I have made referrals, counseling, and provided education to the patient based on review of the above and I have provided the patient with a written personalized care plan for preventive services.

## 2017-03-02 NOTE — Patient Instructions (Signed)
I recommend eye visit once a year I recommend dental visit every 6 months We will call  with lab results  F/U 4 months

## 2017-03-24 DIAGNOSIS — H353221 Exudative age-related macular degeneration, left eye, with active choroidal neovascularization: Secondary | ICD-10-CM | POA: Diagnosis not present

## 2017-03-24 DIAGNOSIS — H353112 Nonexudative age-related macular degeneration, right eye, intermediate dry stage: Secondary | ICD-10-CM | POA: Diagnosis not present

## 2017-05-01 ENCOUNTER — Other Ambulatory Visit: Payer: Self-pay | Admitting: Family Medicine

## 2017-05-01 DIAGNOSIS — Z1231 Encounter for screening mammogram for malignant neoplasm of breast: Secondary | ICD-10-CM

## 2017-05-08 ENCOUNTER — Encounter: Payer: Self-pay | Admitting: Family Medicine

## 2017-05-08 ENCOUNTER — Other Ambulatory Visit: Payer: Self-pay

## 2017-05-08 ENCOUNTER — Ambulatory Visit (INDEPENDENT_AMBULATORY_CARE_PROVIDER_SITE_OTHER): Payer: Medicare HMO | Admitting: Family Medicine

## 2017-05-08 VITALS — BP 136/72 | HR 92 | Temp 98.1°F | Resp 16 | Ht 68.0 in | Wt 157.0 lb

## 2017-05-08 DIAGNOSIS — L603 Nail dystrophy: Secondary | ICD-10-CM

## 2017-05-08 DIAGNOSIS — R079 Chest pain, unspecified: Secondary | ICD-10-CM

## 2017-05-08 DIAGNOSIS — L853 Xerosis cutis: Secondary | ICD-10-CM | POA: Diagnosis not present

## 2017-05-08 DIAGNOSIS — L659 Nonscarring hair loss, unspecified: Secondary | ICD-10-CM | POA: Diagnosis not present

## 2017-05-08 LAB — COMPREHENSIVE METABOLIC PANEL
AG RATIO: 1.3 (calc) (ref 1.0–2.5)
ALT: 14 U/L (ref 6–29)
AST: 14 U/L (ref 10–35)
Albumin: 3.9 g/dL (ref 3.6–5.1)
Alkaline phosphatase (APISO): 84 U/L (ref 33–130)
BILIRUBIN TOTAL: 0.4 mg/dL (ref 0.2–1.2)
BUN: 12 mg/dL (ref 7–25)
CALCIUM: 9.8 mg/dL (ref 8.6–10.4)
CO2: 32 mmol/L (ref 20–32)
Chloride: 101 mmol/L (ref 98–110)
Creat: 0.64 mg/dL (ref 0.60–0.93)
Globulin: 3 g/dL (calc) (ref 1.9–3.7)
Glucose, Bld: 85 mg/dL (ref 65–99)
Potassium: 5 mmol/L (ref 3.5–5.3)
SODIUM: 137 mmol/L (ref 135–146)
TOTAL PROTEIN: 6.9 g/dL (ref 6.1–8.1)

## 2017-05-08 LAB — CBC WITH DIFFERENTIAL/PLATELET
BASOS ABS: 72 {cells}/uL (ref 0–200)
Basophils Relative: 1 %
EOS ABS: 209 {cells}/uL (ref 15–500)
EOS PCT: 2.9 %
HEMATOCRIT: 38.9 % (ref 35.0–45.0)
Hemoglobin: 12.7 g/dL (ref 11.7–15.5)
LYMPHS ABS: 2815 {cells}/uL (ref 850–3900)
MCH: 30.2 pg (ref 27.0–33.0)
MCHC: 32.6 g/dL (ref 32.0–36.0)
MCV: 92.6 fL (ref 80.0–100.0)
MONOS PCT: 7.1 %
MPV: 9.1 fL (ref 7.5–12.5)
NEUTROS PCT: 49.9 %
Neutro Abs: 3593 cells/uL (ref 1500–7800)
Platelets: 415 10*3/uL — ABNORMAL HIGH (ref 140–400)
RBC: 4.2 10*6/uL (ref 3.80–5.10)
RDW: 12.8 % (ref 11.0–15.0)
Total Lymphocyte: 39.1 %
WBC mixed population: 511 cells/uL (ref 200–950)
WBC: 7.2 10*3/uL (ref 3.8–10.8)

## 2017-05-08 LAB — TROPONIN I

## 2017-05-08 LAB — TSH: TSH: 1.05 m[IU]/L (ref 0.40–4.50)

## 2017-05-08 LAB — IRON: IRON: 75 ug/dL (ref 45–160)

## 2017-05-08 NOTE — Patient Instructions (Addendum)
If you have recurrent chest pain, go to ER Continue heartburn medication We will call with lab results  F/U as previously scheduled

## 2017-05-08 NOTE — Progress Notes (Signed)
   Subjective:    Patient ID: [name], female    DOB: 01/08/1946, 71 y.o.   MRN: [id number]  Patient presents for Check Thyroid (brittle nails and hair loss- thinks it's part of thyroid function) and Skin Irritation (has itchy areas on back, but it becomes sore if she scratches)   Had chest pain last night up into neck, has had in past told reflux in past when she went to ER with more symptoms in the past.  She does take her Nexium.  She has not had any chest pain or palpitations no shortness of breath today.  He is concerned about her brittle nails and hair loss this is been going on for years.  She saw a dermatologist a few years ago she has been on biotin therapy as well as multiple T vitamins which have not helped.  She does have some hair thinning in her family.  She would like to have her thyroid labs checked for she goes back on Rogaine which did help with her hair before.  Has a itchy spot on her back which sometimes becomes sore  Review Of Systems:  GEN- denies fatigue, fever, weight loss,weakness, recent illness HEENT- denies eye drainage, change in vision, nasal discharge, CVS- + chest pain,  Denies palpitations RESP- denies SOB, cough, wheeze ABD- denies N/V, change in stools, abd pain GU- denies dysuria, hematuria, dribbling, incontinence MSK- denies joint pain, muscle aches, injury Neuro- denies headache, dizziness, syncope, seizure activity       Objective:    BP 136/72   Pulse 92   Temp 98.1 F (36.7 C) (Oral)   Resp 16   Ht 5\' 8"  (1.727 m)   Wt 157 lb (71.2 kg)   SpO2 96%   BMI 23.87 kg/m  GEN- NAD, alert and oriented x3 HEENT- PERRL, EOMI, non injected sclera, pink conjunctiva, MMM, oropharynx clear Neck- Supple, no thyromegaly CVS- RRR, no murmur RESP-CTAB ABD-NABS,soft,NT,ND EXT- No edema Skin- hair thinning throughout, no erythema of scalp, dry skin on back, freckles, flat moles , no lesions in area of concern, nails brittle few white streaks on  multiple nails  Pulses- Radial 2+   EKG- NSR, no ST changes      Assessment & Plan:      Problem List Items Addressed This Visit    None    Visit Diagnoses    Brittle nails    -  Primary   check TSH with hair thinning and Iron, though most likely age and genetic related, discussed this with her   Relevant Orders   TSH   Hair thinning       Relevant Orders   TSH   Iron   Chest pain, unspecified type       EKG reassuring, does have underlying GERD. Check troponin, CBC, Metabolic STAT, currently pain free, if recurrnce needs to go to ER, if labs abnormal, send to ER   Relevant Orders   CBC with Differential/Platelet   Comprehensive metabolic panel   Troponin I   EKG 12-Lead (Completed)   Dry skin dermatitis       topical moiturizer no other concerning lesions on skin      Note: This dictation was prepared with Dragon dictation along with smaller phrase technology. Any transcriptional errors that result from this process are unintentional.

## 2017-06-01 ENCOUNTER — Other Ambulatory Visit: Payer: Self-pay | Admitting: *Deleted

## 2017-06-01 MED ORDER — METOPROLOL SUCCINATE ER 25 MG PO TB24: 12.5000 mg | ORAL_TABLET | Freq: Every day | ORAL | 3 refills | Status: DC

## 2017-06-01 MED ORDER — DICLOFENAC SODIUM 75 MG PO TBEC: 75.0000 mg | DELAYED_RELEASE_TABLET | Freq: Every day | ORAL | 0 refills | Status: DC | PRN

## 2017-06-05 DIAGNOSIS — H353112 Nonexudative age-related macular degeneration, right eye, intermediate dry stage: Secondary | ICD-10-CM | POA: Diagnosis not present

## 2017-06-05 DIAGNOSIS — H353221 Exudative age-related macular degeneration, left eye, with active choroidal neovascularization: Secondary | ICD-10-CM | POA: Diagnosis not present

## 2017-06-11 ENCOUNTER — Ambulatory Visit (INDEPENDENT_AMBULATORY_CARE_PROVIDER_SITE_OTHER): Payer: Medicare HMO | Admitting: Internal Medicine

## 2017-06-11 ENCOUNTER — Encounter (HOSPITAL_COMMUNITY): Payer: Self-pay

## 2017-06-11 ENCOUNTER — Ambulatory Visit (HOSPITAL_COMMUNITY)
Admission: RE | Admit: 2017-06-11 | Discharge: 2017-06-11 | Disposition: A | Discharge status: Home / Self Care | Payer: Medicare HMO | Source: Ambulatory Visit | Attending: Family Medicine | Admitting: Family Medicine

## 2017-06-11 ENCOUNTER — Encounter (INDEPENDENT_AMBULATORY_CARE_PROVIDER_SITE_OTHER): Payer: Self-pay | Admitting: Internal Medicine

## 2017-06-11 VITALS — BP 160/80 | HR 64 | Temp 97.5°F | Ht 68.0 in | Wt 155.6 lb

## 2017-06-11 DIAGNOSIS — Z1231 Encounter for screening mammogram for malignant neoplasm of breast: Secondary | ICD-10-CM | POA: Diagnosis not present

## 2017-06-11 DIAGNOSIS — K5900 Constipation, unspecified: Secondary | ICD-10-CM

## 2017-06-11 DIAGNOSIS — K6289 Other specified diseases of anus and rectum: Secondary | ICD-10-CM

## 2017-06-11 HISTORY — DX: Personal history of irradiation: Z92.3

## 2017-06-11 NOTE — Patient Instructions (Addendum)
Stool softener daily.  Proctalgia Fugax Proctalgia fugax is a condition that involves very short episodes of intense pain in the rectum. The rectum is the last part of the large intestine. The pain can last from seconds to minutes. Episodes often occur during the night and awaken the person from sleep. This condition is not a sign of cancer, but your health care provider may want to rule out a number of other conditions. What are the causes? The cause of this condition is not known. One possible cause may be spasm of the pelvic muscles or of the lowest part of the large intestine. What are the signs or symptoms? The only symptom of this condition is rectal pain.  The pain may be intense or severe.  The pain may last for only a few seconds or it may last up to 30 minutes.  The pain may occur at night and wake you up from sleep.  How is this diagnosed? This condition may be diagnosed by ruling out other problems that could cause the pain. Diagnosis may include:  Medical history and physical exam.  Various tests, such as: ? Anoscopy. In this test, a lighted scope is put into the rectum to look for abnormalities. ? Barium enema. In this test, X-rays are taken after a white chalky substance called barium is put into the colon. The barium makes it easier to see problems because it shows up well on the X-rays. ? Blood tests to rule out infections or other problems.  How is this treated? There is no specific treatment to cure this condition. Treatment options may include:  Medicines.  Warm baths.  Relaxation techniques.  Gentle massage of the painful area.  Biofeedback.  Follow these instructions at home:  Take over-the-counter and prescription medicines only as told by your health care provider.  Follow instructions from your health care provider about diet.  Follow instructions from your health care provider about rest and physical activity.  Try warm baths, massaging the area,  or progressive relaxation techniques as told by your health care provider.  Keep all follow-up visits as told by your health care provider. This is important. Contact a health care provider if:  You develop new symptoms.  Your pain does not get better as soon as it usually does. This information is not intended to replace advice given to you by your health care provider. Make sure you discuss any questions you have with your health care provider. Document Released: 02/11/2001 Document Revised: 10/25/2015 Document Reviewed: 08/14/2014 Elsevier Interactive Patient Education  2018 [employer].  Constipation, Adult Constipation is when a person:  Poops (has a bowel movement) fewer times in a week than normal.  Has a hard time pooping.  Has poop that is dry, hard, or bigger than normal.  Follow these instructions at home: Eating and drinking   Eat foods that have a lot of fiber, such as: ? Fresh fruits and vegetables. ? Whole grains. ? Beans.  Eat less of foods that are high in fat, low in fiber, or overly processed, such as: ? [place] fries. ? Hamburgers. ? Cookies. ? Candy. ? Soda.  Drink enough fluid to keep your pee (urine) clear or pale yellow. General instructions  Exercise regularly or as told by your doctor.  Go to the restroom when you feel like you need to poop. Do not hold it in.  Take over-the-counter and prescription medicines only as told by your doctor. These include any fiber supplements.  Do pelvic  floor retraining exercises, such as: ? Doing deep breathing while relaxing your lower belly (abdomen). ? Relaxing your pelvic floor while pooping.  Watch your condition for any changes.  Keep all follow-up visits as told by your doctor. This is important. Contact a doctor if:  You have pain that gets worse.  You have a fever.  You have not pooped for 4 days.  You throw up (vomit).  You are not hungry.  You lose weight.  You are bleeding from  the anus.  You have thin, pencil-like poop (stool). Get help right away if:  You have a fever, and your symptoms suddenly get worse.  You leak poop or have blood in your poop.  Your belly feels hard or bigger than normal (is bloated).  You have very bad belly pain.  You feel dizzy or you faint. This information is not intended to replace advice given to you by your health care provider. Make sure you discuss any questions you have with your health care provider. Document Released: 11/05/2007 Document Revised: 12/07/2015 Document Reviewed: 11/07/2015 Elsevier Interactive Patient Education  2018 [employer].

## 2017-06-11 NOTE — Progress Notes (Signed)
Subjective:    Patient ID: [name], female    DOB: July 10, 1945, 72 y.o.   MRN: [id number]  HPI Presents today with c/o rectal pain. States last week she had rectal pain. She says the pain will last 30-40 minutes. Usually occur at night.  She says if she sits on the commode pain will ease up. Symptoms started 30 yrs ago.  Pain is sporadic. She also tells me she may go a week without having a BM.   She has increased her fiber. Is not eating yogurt now.    Last colonoscopy in 2013: Prep excellent. Tortuous redundant colon. Few large diverticula at sigmoid and descending colon. No colonic polyps or other mucosal abnormalities. Normal rectal mucosa. Thickened anoderm.  Review of Systems Past Medical History:  Diagnosis Date  . Breast cancer ([place])    left breast/lumpectomy/chemo/rad 2003  . COPD (chronic obstructive pulmonary disease) ([place])   . Diverticulitis   . GERD (gastroesophageal reflux disease)   . Hemoptysis   . Hyperlipidemia   . Iron deficiency   . Pulmonary embolism ([place])   . Tachycardia     Past Surgical History:  Procedure Laterality Date  . BACK SURGERY    . BREAST LUMPECTOMY    . COLONOSCOPY  01/28/2012   Procedure: COLONOSCOPY;  Surgeon: Rogene Houston, MD;  Location: [facility];  Service: Endoscopy;  Laterality: N/A;  1200  . COLONOSCOPY  01-2012  . ESOPHAGOGASTRODUODENOSCOPY N/A 07/01/2013   Procedure: ESOPHAGOGASTRODUODENOSCOPY (EGD);  Surgeon: Rogene Houston, MD;  Location: [facility];  Service: Endoscopy;  Laterality: N/A;  1:10  . LAPAROSCOPIC TUBAL LIGATION  1970  . TONSILLECTOMY     Patient was age 64    Allergies  Allergen Reactions  . Sulfa Antibiotics Other (See Comments)    Childhood Allergy     Current Outpatient Medications on File Prior to Visit  Medication Sig Dispense Refill  . aspirin 81 MG tablet Take 81 mg by mouth daily.      Marland Kitchen CALCIUM PO Take 1 tablet by mouth daily.    . Cholecalciferol (VITAMIN D PO) Take 1 tablet by  mouth daily.    . clotrimazole-betamethasone (LOTRISONE) cream Apply 1 application topically 2 (two) times daily. (Patient taking differently: Apply 1 application topically as needed. ) 30 g 1  . diclofenac (VOLTAREN) 75 MG EC tablet Take 1 tablet (75 mg total) by mouth daily as needed. 90 tablet 0  . esomeprazole (NEXIUM) 20 MG capsule Take 20 mg by mouth daily at 12 noon.    . metoprolol succinate (TOPROL-XL) 25 MG 24 hr tablet Take 0.5 tablets (12.5 mg total) by mouth daily. 45 tablet 3  . Multiple Vitamins-Minerals (PRESERVISION AREDS 2) CAPS Take by mouth 2 (two) times daily.     . Omega-3 Fatty Acids (FISH OIL) 1000 MG CAPS Take by mouth daily.    . Psyllium (METAMUCIL PO) Take by mouth 2 (two) times daily.    . Red Yeast Rice Extract (RED YEAST RICE PO) Take by mouth 2 (two) times daily.    . timolol (TIMOPTIC) 0.5 % ophthalmic solution Place 1 drop into the left eye daily.      No current facility-administered medications on file prior to visit.         Objective:   Physical Exam Blood pressure (!) 160/80, pulse 64, temperature (!) 97.5 F (36.4 C), height 5\' 8"  (1.727 m), weight 155 lb 9.6 oz (70.6 kg). Alert and oriented. Skin warm  and dry. Oral mucosa is moist.   . Sclera anicteric, conjunctivae is pink. Thyroid not enlarged. No cervical lymphadenopathy. Lungs clear. Heart regular rate and rhythm.  Abdomen is soft. Bowel sounds are positive. No hepatomegaly. No abdominal masses felt. No tenderness.  No edema to lower extremities.  Stool brown and guaiac negative.         Assessment & Plan:  Rectal pain. Constipation: Stool softener daily. Patient instructions given to patient on rectal pain and constipation.

## 2017-07-03 ENCOUNTER — Ambulatory Visit (INDEPENDENT_AMBULATORY_CARE_PROVIDER_SITE_OTHER): Payer: Medicare HMO | Admitting: Family Medicine

## 2017-07-03 ENCOUNTER — Other Ambulatory Visit: Payer: Self-pay

## 2017-07-03 ENCOUNTER — Encounter: Payer: Self-pay | Admitting: Family Medicine

## 2017-07-03 VITALS — BP 118/60 | HR 80 | Temp 97.9°F | Resp 16 | Ht 68.0 in | Wt 160.0 lb

## 2017-07-03 DIAGNOSIS — K219 Gastro-esophageal reflux disease without esophagitis: Secondary | ICD-10-CM

## 2017-07-03 DIAGNOSIS — E782 Mixed hyperlipidemia: Secondary | ICD-10-CM

## 2017-07-03 DIAGNOSIS — I495 Sick sinus syndrome: Secondary | ICD-10-CM

## 2017-07-03 DIAGNOSIS — I87303 Chronic venous hypertension (idiopathic) without complications of bilateral lower extremity: Secondary | ICD-10-CM | POA: Diagnosis not present

## 2017-07-03 NOTE — Assessment & Plan Note (Signed)
Wearing compression hose, seen by vascular back in May

## 2017-07-03 NOTE — Assessment & Plan Note (Signed)
Controlled with PPI

## 2017-07-03 NOTE — Assessment & Plan Note (Signed)
Improved recently in Oct  Red yeast rice, fish oil

## 2017-07-03 NOTE — Progress Notes (Signed)
   Subjective:    Patient ID: [name], female    DOB: July 11, 1945, 72 y.o.   MRN: [id number]  Patient presents for Follow-up  Patient here to follow-up chronic medical problems.  She was seen by GI secondary to rectal pain about 2 weeks ago.  She was placed on a stool softener.  Natasha Mead - as prescribed metoprolol 12.5 mg daily  Hyperlipidemia she is treating with diet as well as red yeast rice as well as fish oil her last LDL was 139 in October  She had interim labs in December secondary to some chest discomfort everything came back normal.   Review Of Systems:  GEN- denies fatigue, fever, weight loss,weakness, recent illness HEENT- denies eye drainage, change in vision, nasal discharge, CVS- denies chest pain, palpitations RESP- denies SOB, cough, wheeze ABD- denies N/V, change in stools, abd pain GU- denies dysuria, hematuria, dribbling, incontinence MSK- denies joint pain, muscle aches, injury Neuro- denies headache, dizziness, syncope, seizure activity       Objective:    BP 118/60   Pulse 80   Temp 97.9 F (36.6 C) (Oral)   Resp 16   Ht 5\' 8"  (1.727 m)   Wt 160 lb (72.6 kg)   SpO2 96%   BMI 24.33 kg/m  GEN- NAD, alert and oriented x3 HEENT- PERRL, EOMI, non injected sclera, pink conjunctiva, MMM, oropharynx clear Neck- Supple, no thyromegaly CVS- RRR, no murmur RESP-CTAB ABD-NABS,soft,NT,ND EXT- No edema, Varicose veins  Pulses- Radial, DP- 2+        Assessment & Plan:      Problem List Items Addressed This Visit      Unprioritized   Venous hypertension of both lower extremities    Wearing compression hose, seen by vascular back in May       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 Any transcriptional errors that result from this process are unintentional.

## 2017-07-03 NOTE — Assessment & Plan Note (Signed)
Controlled on metoprolol.

## 2017-07-03 NOTE — Patient Instructions (Addendum)
F/U 6 months

## 2017-08-28 DIAGNOSIS — H353112 Nonexudative age-related macular degeneration, right eye, intermediate dry stage: Secondary | ICD-10-CM | POA: Diagnosis not present

## 2017-08-28 DIAGNOSIS — H353221 Exudative age-related macular degeneration, left eye, with active choroidal neovascularization: Secondary | ICD-10-CM | POA: Diagnosis not present

## 2017-08-31 ENCOUNTER — Other Ambulatory Visit: Payer: Self-pay

## 2017-08-31 ENCOUNTER — Encounter: Payer: Self-pay | Admitting: Family Medicine

## 2017-08-31 ENCOUNTER — Ambulatory Visit (INDEPENDENT_AMBULATORY_CARE_PROVIDER_SITE_OTHER): Payer: Medicare HMO | Admitting: Family Medicine

## 2017-08-31 DIAGNOSIS — M5136 Other intervertebral disc degeneration, lumbar region: Secondary | ICD-10-CM | POA: Diagnosis not present

## 2017-08-31 DIAGNOSIS — M159 Polyosteoarthritis, unspecified: Secondary | ICD-10-CM | POA: Diagnosis not present

## 2017-08-31 DIAGNOSIS — M51369 Other intervertebral disc degeneration, lumbar region without mention of lumbar back pain or lower extremity pain: Secondary | ICD-10-CM | POA: Insufficient documentation

## 2017-08-31 HISTORY — DX: Other intervertebral disc degeneration, lumbar region without mention of lumbar back pain or lower extremity pain: M51.369

## 2017-08-31 HISTORY — DX: Other intervertebral disc degeneration, lumbar region: M51.36

## 2017-08-31 MED ORDER — TRAMADOL-ACETAMINOPHEN 37.5-325 MG PO TABS: 1.0000 | ORAL_TABLET | Freq: Two times a day (BID) | ORAL | 0 refills | Status: DC | PRN

## 2017-08-31 NOTE — Assessment & Plan Note (Signed)
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  We will hold on anti-inflammatories with her symptoms.  She does not have any current pain in her abdomen.  I am going to have her use acetaminophen or when pain is severe she can use Ultracet.  We also discussed water aerobics and tai chi or yoga

## 2017-08-31 NOTE — Patient Instructions (Addendum)
Take tylenol extra strength 500mg   Try the ultracet twice a day  F/U as previous

## 2017-08-31 NOTE — Progress Notes (Signed)
   Subjective:    Patient ID: [name], female    DOB: April 11, 1946, 72 y.o.   MRN: [id number]  Patient presents for Lower Back Pain (arthritis to tip of spine)   Pt here with DDD mulitilvels in lumbar spine/arthritis in spine.  In bowel or bladder no radiating symptoms.    Has Multi joint OA, was on diclofenac 6m daily as needed  few weeks her joints have been acting up.   Stopped due to worsening reflux, she is taking Nexium  sHe would like an alternative to the anti-inflammatory She has Not had any falls  She had shots in her eyes last week   Review Of Systems:  GEN- denies fatigue, fever, weight loss,weakness, recent illness HEENT- denies eye drainage, change in vision, nasal discharge, CVS- denies chest pain, palpitations RESP- denies SOB, cough, wheeze ABD- denies N/V, change in stools, abd pain GU- denies dysuria, hematuria, dribbling, incontinence MSK- + joint pain, muscle aches, injury Neuro- denies headache, dizziness, syncope, seizure activity       Objective:    BP 122/70   Pulse 96   Temp 98.6 F (37 C) (Oral)   Resp 14   Ht _0  (1.727 m)   Wt 160 lb (72.6 kg)   SpO2 100%   BMI 24.33 kg/m  GEN- NAD, alert and oriented x3 HEENT- PERRL, EOMI, non injected sclera, pink conjunctiva, MMM, oropharynx clear, subjunctival hemorrhage, left corner  Neck- Supple, no thyromegaly CVS- RRR, no murmur RESP-CTAB ABD-NABS,soft,NT,ND MSK- spine NT, fair ROM, Decreased ROM HIPS/KNEES Neuro- normal tone LE, strength equal bilat, sensation grossly in tact  EXT- No edema Pulses- Radial, DP- 2+        Assessment & Plan:      Problem List Items Addressed This Visit      Unprioritized   Generalized OA   Relevant Medications   traMADol-acetaminophen (ULTRACET) 37.5-325 MG tablet   DDD (degenerative disc disease), lumbar    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  We will hold on anti-inflammatories with her symptoms.  She does  not have any current pain in her abdomen.  I am going to have her use acetaminophen or when pain is severe she can use Ultracet.  We also discussed water aerobics and tai chi or yoga      Relevant Medications   traMADol-acetaminophen (ULTRACET) 37.5-325 MG tablet      Note: This dictation was prepared with Dragon dictation along with smaller phrase technology. Any transcriptional errors that result from this process are unintentional.

## 2017-09-10 DIAGNOSIS — H35373 Puckering of macula, bilateral: Secondary | ICD-10-CM | POA: Diagnosis not present

## 2017-09-10 DIAGNOSIS — H401134 Primary open-angle glaucoma, bilateral, indeterminate stage: Secondary | ICD-10-CM | POA: Diagnosis not present

## 2017-09-10 DIAGNOSIS — H353221 Exudative age-related macular degeneration, left eye, with active choroidal neovascularization: Secondary | ICD-10-CM | POA: Diagnosis not present

## 2017-09-10 DIAGNOSIS — H353112 Nonexudative age-related macular degeneration, right eye, intermediate dry stage: Secondary | ICD-10-CM | POA: Diagnosis not present

## 2017-09-10 DIAGNOSIS — H26491 Other secondary cataract, right eye: Secondary | ICD-10-CM | POA: Diagnosis not present

## 2017-10-12 ENCOUNTER — Ambulatory Visit (INDEPENDENT_AMBULATORY_CARE_PROVIDER_SITE_OTHER): Payer: Medicare HMO | Admitting: Family Medicine

## 2017-10-12 ENCOUNTER — Encounter: Payer: Self-pay | Admitting: Family Medicine

## 2017-10-12 ENCOUNTER — Other Ambulatory Visit: Payer: Self-pay

## 2017-10-12 VITALS — BP 126/68 | HR 92 | Temp 97.9°F | Resp 16 | Ht 68.0 in | Wt 158.0 lb

## 2017-10-12 DIAGNOSIS — J069 Acute upper respiratory infection, unspecified: Secondary | ICD-10-CM | POA: Diagnosis not present

## 2017-10-12 MED ORDER — BENZONATATE 100 MG PO CAPS: 100.0000 mg | ORAL_CAPSULE | Freq: Three times a day (TID) | ORAL | 0 refills | Status: DC | PRN

## 2017-10-12 NOTE — Progress Notes (Signed)
   Subjective:    Patient ID: [name], female    DOB: [birth year], 72 y.o.   MRN: [id number]  Patient presents for Illness (x2 days- productive cough, )    Cough with production started Saturday night. Has sore throat, feeling woozy in head. Denies any nasal congestion or drainage. No fever. Feels SOB with cough only, feels drainage down throat No vomiting or diarrhea Has not taken any OTC meds due to her Glaucoma   Review Of Systems:  GEN- denies fatigue, fever, weight loss,weakness, recent illness HEENT- denies eye drainage, change in vision, nasal discharge, CVS- denies chest pain, palpitations RESP- denies SOB, +cough, wheeze ABD- denies N/V, change in stools, abd pain GU- denies dysuria, hematuria, dribbling, incontinence MSK- denies joint pain, muscle aches, injury Neuro- denies headache, dizziness, syncope, seizure activity       Objective:    BP 126/68   Pulse 92   Temp 97.9 F (36.6 C) (Oral)   Resp 16   Ht 5\' 8"  (1.727 m)   Wt 158 lb (71.7 kg)   SpO2 96%   BMI 24.02 kg/m  GEN- NAD, alert and oriented x3 HEENT- PERRL, EOMI, non injected sclera, pink conjunctiva, MMM, oropharynx mild injection, TM clear bilat no effusion,  No  maxillary sinus tenderness, inflammed turbinates,  Nasal drainage  Neck- Supple, no LAD CVS- RRR, no murmur RESP-CTAB NEURO-CNII-XII in tact no deficits  EXT- No edema Pulses- Radial 2+         Assessment & Plan:      Problem List Items Addressed This Visit    None    Visit Diagnoses    Viral upper respiratory tract infection    -  Primary   Viral URI 2 days of symptoms, afebrilenormal vitals. given tessalon, robitussin use allergy meds, nasal spray, plenty of fluid and rest, call if not improved end of week      Note: This dictation was prepared with Dragon dictation along with smaller phrase technology. Any transcriptional errors that result from this process are unintentional.

## 2017-10-12 NOTE — Patient Instructions (Addendum)
Use nasal saline or flonase for drainage  Use tessalon perrles  Plain robitussin for mucous- NO DM or D  F/U as needed

## 2017-10-15 ENCOUNTER — Telehealth: Payer: Self-pay | Admitting: *Deleted

## 2017-10-15 NOTE — Telephone Encounter (Signed)
Received call from patient.   Reports that she has tried OTC medications for Sx but has not improved.   MD please advise.

## 2017-10-15 NOTE — Telephone Encounter (Signed)
Received return call from patient.   States that she does think she is suffering from virus. States that she has continued to feel crappy and have non-productive cough. States that she has had some diarrhea and also one nose bleed.   Reports that she is using OTC medications for Sx management.

## 2017-10-16 MED ORDER — AZITHROMYCIN 250 MG PO TABS: ORAL_TABLET | ORAL | 0 refills | Status: DC

## 2017-10-16 NOTE — Addendum Note (Signed)
Addended by: Sheral Flow on: 10/16/2017 12:13 PM   Modules accepted: Orders

## 2017-10-16 NOTE — Telephone Encounter (Signed)
Patient returned call and made aware.   Prescription sent to pharmacy.  

## 2017-10-16 NOTE — Telephone Encounter (Signed)
Call placed to patient. LMTRC.  

## 2017-10-16 NOTE — Telephone Encounter (Signed)
Send in zpak antibiotic, continue all other meds we discussed at visit Plenty of fluids If she gets SOB, or has vomiting, go to ER

## 2017-10-27 ENCOUNTER — Other Ambulatory Visit: Payer: Self-pay

## 2017-10-27 ENCOUNTER — Ambulatory Visit (INDEPENDENT_AMBULATORY_CARE_PROVIDER_SITE_OTHER): Payer: Medicare HMO | Admitting: Family Medicine

## 2017-10-27 ENCOUNTER — Encounter: Payer: Self-pay | Admitting: Family Medicine

## 2017-10-27 VITALS — BP 128/68 | HR 100 | Temp 98.3°F | Resp 16 | Ht 68.0 in | Wt 154.0 lb

## 2017-10-27 DIAGNOSIS — H938X3 Other specified disorders of ear, bilateral: Secondary | ICD-10-CM | POA: Diagnosis not present

## 2017-10-27 DIAGNOSIS — R42 Dizziness and giddiness: Secondary | ICD-10-CM

## 2017-10-27 DIAGNOSIS — J069 Acute upper respiratory infection, unspecified: Secondary | ICD-10-CM

## 2017-10-27 MED ORDER — MECLIZINE HCL 12.5 MG PO TABS: 12.5000 mg | ORAL_TABLET | Freq: Three times a day (TID) | ORAL | 0 refills | Status: DC | PRN

## 2017-10-27 MED ORDER — PREDNISONE 10 MG PO TABS: ORAL_TABLET | ORAL | 0 refills | Status: DC

## 2017-10-27 NOTE — Patient Instructions (Signed)
Zyrtec once a day  Take meclizine  Take the steroids as prescribed Recheck in 2-3 weeks

## 2017-10-27 NOTE — Progress Notes (Signed)
   Subjective:    Patient ID: [name], female    DOB: 1945/12/20, 72 y.o.   MRN: [id number]  Patient presents for Illness (x4 days- ear pressure, head congestion, dizziness)   Pt here with ear congestion, head congestion, dizziness. Cough has resolved.   She was seen 5/13 diagnosed with viral URI, also had diarrhea, now resolved Appetite finially starting to improve - weight down 4lbs Has been drinking ensure  She called back worse on 5/16 given zpak   She has had some dizziness before she became sick/ will have to hold on to things     Review Of Systems:  GEN- denies fatigue, fever, weight loss,weakness, recent illness HEENT- denies eye drainage, change in vision, nasal discharge, CVS- denies chest pain, palpitations RESP- denies SOB, cough, wheeze ABD- denies N/V, change in stools, abd pain GU- denies dysuria, hematuria, dribbling, incontinence MSK- denies joint pain, muscle aches, injury Neuro- denies headache, +dizziness, syncope, seizure activity       Objective:    BP 128/68   Pulse 100   Temp 98.3 F (36.8 C) (Oral)   Resp 16   Ht 5\' 8"  (1.727 m)   Wt 154 lb (69.9 kg)   SpO2 96%   BMI 23.42 kg/m  GEN- NAD, alert and oriented x3 HEENT- PERRL, EOMI, non injected sclera, pink conjunctiva, MMM, oropharynx mild injection, TM clear bilat no effusion,  No  maxillary sinus tenderness, inflammed turbinates,  Nasal drainage  Neck- Supple, no LAD CVS- RRR, no murmur RESP-CTAB Neuro- CNII-XII in tact, no focal deficits EXT- No edema Pulses- Radial 2+         Assessment & Plan:      Problem List Items Addressed This Visit    None    Visit Diagnoses    Ear congestion, bilateral    -  Primary   congestion in setting of recent viral URI, hearing grossly in tact, given prednisone, use anti-histamine.   Vertigo       Some vertigo prior to getting sick, given meclizine to use prn, image if persist after URI resolution   Viral URI          Note: This  dictation was prepared with Dragon dictation along with smaller phrase technology. Any transcriptional errors that result from this process are unintentional.

## 2017-11-13 ENCOUNTER — Ambulatory Visit (INDEPENDENT_AMBULATORY_CARE_PROVIDER_SITE_OTHER): Payer: Medicare HMO | Admitting: Family Medicine

## 2017-11-13 ENCOUNTER — Encounter: Payer: Self-pay | Admitting: Family Medicine

## 2017-11-13 ENCOUNTER — Other Ambulatory Visit: Payer: Self-pay

## 2017-11-13 VITALS — BP 110/68 | HR 64 | Temp 98.4°F | Resp 16 | Ht 68.0 in | Wt 159.0 lb

## 2017-11-13 DIAGNOSIS — R42 Dizziness and giddiness: Secondary | ICD-10-CM | POA: Diagnosis not present

## 2017-11-13 DIAGNOSIS — H938X3 Other specified disorders of ear, bilateral: Secondary | ICD-10-CM

## 2017-11-13 DIAGNOSIS — M542 Cervicalgia: Secondary | ICD-10-CM | POA: Diagnosis not present

## 2017-11-13 NOTE — Patient Instructions (Addendum)
Referral to ENT  Keep taking allergy  Try the meclizine for dizziness Use heating pad, topical or use the tylenol  F/U as previous

## 2017-11-13 NOTE — Progress Notes (Signed)
   Subjective:    Patient ID: [name], female    DOB: 1945-12-03, 72 y.o.   MRN: [id number]  Patient presents for Follow-up (states that she still has increased pressure in ears, nasal congestion and cough- reports no improvement with use of prednisone) and Neck Pain (Center of neck and down to L shoulder pain- thinks she may have slept wrong)   Completed prednisone for nasal/ear congestion. Took steroids, antibotics over past month, oral anti-histamine . Has not used the meclizine. She has noticed Right ear is clearer, but has soreness behind left ear Getting dizzy spells when getting up and walking or if she moves her head quickly, did not try the meclizine I gave her yet    [place] from the beach had severe fecal urgency, no blood in stool, no watery stool. Denies any fever, abd pain the days before that.  Neck pain- Center of neck down into left shoulder pain For past 3 days. Used topical pain cream. No tingling or numbness in hands, she is not dropping any items ,feels she may have slept wrong   History of neck surgery  Has not taken any tylenol or ultracet        Review Of Systems:  GEN- denies fatigue, fever, weight loss,weakness, recent illness HEENT- denies eye drainage, change in vision, nasal discharge, CVS- denies chest pain, palpitations RESP- denies SOB, cough, wheeze ABD- denies N/V, change in stools, abd pain GU- denies dysuria, hematuria, dribbling, incontinence MSK- + joint pain, muscle aches, injury Neuro- denies headache, dizziness, syncope, seizure activity       Objective:    BP 110/68   Pulse 64   Temp 98.4 F (36.9 C) (Oral)   Resp 16   Ht 5\' 8"  (1.727 m)   Wt 159 lb (72.1 kg)   SpO2 96%   BMI 24.18 kg/m  GEN- NAD, alert and oriented x3 HEENT- PERRL, EOMI, non injected sclera, pink conjunctiva, MMM, oropharynx clear, TM clear bilat, hearing grossly in tact, nares clear  Neck- Supple, no thyromegaly, no LAD, fair ROM, spine NT, neg spurlings   CVS- RRR, no murmur RESP-CTAB MSK- fair ROM upper ext, rotator cuff in tact Neuro- CNII-XII grossly in tact, sensation in tact Upper ext  EXT- No edema Pulses- Radial 2+        Assessment & Plan:      Problem List Items Addressed This Visit    None    Visit Diagnoses    Vertigo    -  Primary   Referral to ENT, can use meclizine as prescribed. Recommend she can continue allergy med for the ear pressure, no sign of infection, no fluid seen   Relevant Orders   Ambulatory referral to ENT   Pressure sensation in both ears       Relevant Orders   Ambulatory referral to ENT   Neck pain       chronic issue, no red flags, has history of surgery, as acute pain try to treat conservatvely first, with heating pad, tylenol, topicals, if no improvement can get f/u surgeon      Note: This dictation was prepared with Dragon dictation along with smaller phrase technology. Any transcriptional errors that result from this process are unintentional.

## 2017-11-16 ENCOUNTER — Encounter: Payer: Self-pay | Admitting: Family Medicine

## 2017-11-19 ENCOUNTER — Telehealth: Payer: Self-pay | Admitting: *Deleted

## 2017-11-19 NOTE — Telephone Encounter (Signed)
Received F/U call from patient.   States that she spoke with Dr. Deeann Saint office and was advised that new patient appointments are being scheduled out until Nov.   States that her spouse goes to Dr. Melony Overly [phone]~ telephone and was advised that she can be seen in 3 weeks with referral.   Ok to change referral to Dr. Lucia Gaskins?

## 2017-11-19 NOTE — Telephone Encounter (Signed)
Received call from patient.   Inquired as to status of referral to Dr. Benjamine Mola.   Please call her with update.

## 2017-11-20 NOTE — Telephone Encounter (Signed)
Okay to change to Dr. Lucia Gaskins, needs earlier appointment

## 2017-11-23 ENCOUNTER — Ambulatory Visit (HOSPITAL_COMMUNITY)
Admission: RE | Admit: 2017-11-23 | Discharge: 2017-11-23 | Disposition: A | Discharge status: Home / Self Care | Payer: Medicare HMO | Source: Ambulatory Visit | Attending: Family Medicine | Admitting: Family Medicine

## 2017-11-23 ENCOUNTER — Other Ambulatory Visit: Payer: Self-pay | Admitting: *Deleted

## 2017-11-23 ENCOUNTER — Telehealth: Payer: Self-pay | Admitting: *Deleted

## 2017-11-23 DIAGNOSIS — S3993XA Unspecified injury of pelvis, initial encounter: Secondary | ICD-10-CM | POA: Diagnosis not present

## 2017-11-23 DIAGNOSIS — R52 Pain, unspecified: Secondary | ICD-10-CM

## 2017-11-23 DIAGNOSIS — M25551 Pain in right hip: Secondary | ICD-10-CM | POA: Insufficient documentation

## 2017-11-23 DIAGNOSIS — M533 Sacrococcygeal disorders, not elsewhere classified: Secondary | ICD-10-CM | POA: Diagnosis not present

## 2017-11-23 DIAGNOSIS — W19XXXA Unspecified fall, initial encounter: Secondary | ICD-10-CM

## 2017-11-23 DIAGNOSIS — S79911A Unspecified injury of right hip, initial encounter: Secondary | ICD-10-CM | POA: Diagnosis not present

## 2017-11-23 NOTE — Telephone Encounter (Signed)
Received call from patient.   Reports that she had fall on 11/19/2017 landing on bottom and R hip. States that she has been having pain in area since that time.  States that she can stand, sit and walk, but it is with pain. States that she is most comfortable lying down.   Patient is unable to come in to office until 11/24/2017. Orders for X-rays to R hip and sacrum/ coccyx ordered.   MD to be made aware.

## 2017-11-23 NOTE — Telephone Encounter (Signed)
Referral faxed to Dr. Melony Overly office. Pending appointment to be scheduled.

## 2017-11-24 ENCOUNTER — Encounter: Payer: Self-pay | Admitting: Family Medicine

## 2017-11-24 ENCOUNTER — Ambulatory Visit (INDEPENDENT_AMBULATORY_CARE_PROVIDER_SITE_OTHER): Payer: Medicare HMO | Admitting: Family Medicine

## 2017-11-24 ENCOUNTER — Ambulatory Visit: Payer: Self-pay | Admitting: Family Medicine

## 2017-11-24 VITALS — BP 110/68 | HR 104 | Temp 98.3°F | Resp 16 | Ht 68.5 in | Wt 155.0 lb

## 2017-11-24 DIAGNOSIS — R42 Dizziness and giddiness: Secondary | ICD-10-CM

## 2017-11-24 DIAGNOSIS — J329 Chronic sinusitis, unspecified: Secondary | ICD-10-CM | POA: Diagnosis not present

## 2017-11-24 DIAGNOSIS — S39012A Strain of muscle, fascia and tendon of lower back, initial encounter: Secondary | ICD-10-CM

## 2017-11-24 DIAGNOSIS — W19XXXA Unspecified fall, initial encounter: Secondary | ICD-10-CM

## 2017-11-24 MED ORDER — METHOCARBAMOL 500 MG PO TABS: 500.0000 mg | ORAL_TABLET | Freq: Three times a day (TID) | ORAL | 0 refills | Status: DC | PRN

## 2017-11-24 MED ORDER — MONTELUKAST SODIUM 10 MG PO TABS: 10.0000 mg | ORAL_TABLET | Freq: Every day | ORAL | 3 refills | Status: DC

## 2017-11-24 MED ORDER — MOMETASONE FUROATE 50 MCG/ACT NA SUSP: 2.0000 | Freq: Every day | NASAL | 12 refills | Status: DC

## 2017-11-24 NOTE — Patient Instructions (Signed)
Continue to use lido patches as directed on box.  Use tylenol and NSAIDs (as tolerate) for pain.  I have provided a muscle relaxer for severe spasms or stiffness.  It can be sedating, so try first at night.     For your sinuses and ears - use a daily (nightly) antihistamine like claritin/allergra/zyrtec, and add to it the steroid nasal spray and if those do not work in 1-2 weeks, add to that the montelukast (prescription allergy medicine)    If your back does not start to improve in 1-2 weeks physical therapy can be very beneficial for recovery after a strain.  I will follow up on ENT referral.  I could continue to try the meclizine when you have acute spinning/dizzy episodes.      Allergic Rhinitis, Adult Allergic rhinitis is an allergic reaction that affects the mucous membrane inside the nose. It causes sneezing, a runny or stuffy nose, and the feeling of mucus going down the back of the throat (postnasal drip). Allergic rhinitis can be mild to severe. There are two types of allergic rhinitis:  Seasonal. This type is also called hay fever. It happens only during certain seasons.  Perennial. This type can happen at any time of the year.  What are the causes? This condition happens when the body's defense system (immune system) responds to certain harmless substances called allergens as though they were germs.  Seasonal allergic rhinitis is triggered by pollen, which can come from grasses, trees, and weeds. Perennial allergic rhinitis may be caused by:  House dust mites.  Pet dander.  Mold spores.  What are the signs or symptoms? Symptoms of this condition include:  Sneezing.  Runny or stuffy nose (nasal congestion).  Postnasal drip.  Itchy nose.  Tearing of the eyes.  Trouble sleeping.  Daytime sleepiness.  How is this diagnosed? This condition may be diagnosed based on:  Your medical history.  A physical exam.  Tests to check for related conditions, such  as: ? Asthma. ? Pink eye. ? Ear infection. ? Upper respiratory infection.  Tests to find out which allergens trigger your symptoms. These may include skin or blood tests.  How is this treated? There is no cure for this condition, but treatment can help control symptoms. Treatment may include:  Taking medicines that block allergy symptoms, such as antihistamines. Medicine may be given as a shot, nasal spray, or pill.  Avoiding the allergen.  Desensitization. This treatment involves getting ongoing shots until your body becomes less sensitive to the allergen. This treatment may be done if other treatments do not help.  If taking medicine and avoiding the allergen does not work, new, stronger medicines may be prescribed.  Follow these instructions at home:  Find out what you are allergic to. Common allergens include smoke, dust, and pollen.  Avoid the things you are allergic to. These are some things you can do to help avoid allergens: ? Replace carpet with wood, tile, or vinyl flooring. Carpet can trap dander and dust. ? Do not smoke. Do not allow smoking in your home. ? Change your heating and air conditioning filter at least once a month. ? During allergy season:  Keep windows closed as much as possible.  Plan outdoor activities when pollen counts are lowest. This is usually during the evening hours.  When coming indoors, change clothing and shower before sitting on furniture or bedding.  Take over-the-counter and prescription medicines only as told by your health care provider.  Keep all  follow-up visits as told by your health care provider. This is important. Contact a health care provider if:  You have a fever.  You develop a persistent cough.  You make whistling sounds when you breathe (you wheeze).  Your symptoms interfere with your normal daily activities. Get help right away if:  You have shortness of breath. Summary  This condition can be managed by taking  medicines as directed and avoiding allergens.  Contact your health care provider if you develop a persistent cough or fever.  During allergy season, keep windows closed as much as possible. This information is not intended to replace advice given to you by your health care provider. Make sure you discuss any questions you have with your health care provider. Document Released: 02/11/2001 Document Revised: 06/26/2016 Document Reviewed: 06/26/2016 Elsevier Interactive Patient Education  2018 [place].   Eustachian Tube Dysfunction The eustachian tube connects the middle ear to the back of the nose. It regulates air pressure in the middle ear by allowing air to move between the ear and nose. It also helps to drain fluid from the middle ear space. When the eustachian tube does not function properly, air pressure, fluid, or both can build up in the middle ear. Eustachian tube dysfunction can affect one or both ears. What are the causes? This condition happens when the eustachian tube becomes blocked or cannot open normally. This may result from:  Ear infections.  Colds and other upper respiratory infections.  Allergies.  Irritation, such as from cigarette smoke or acid from the stomach coming up into the esophagus (gastroesophageal reflux).  Sudden changes in air pressure, such as from descending in an airplane.  Abnormal growths in the nose or throat, such as nasal polyps, tumors, or enlarged tissue at the back of the throat (adenoids).  What increases the risk? This condition may be more likely to develop in people who smoke and people who are overweight. Eustachian tube dysfunction may also be more likely to develop in children, especially children who have:  Certain birth defects of the mouth, such as cleft palate.  Large tonsils and adenoids.  What are the signs or symptoms? Symptoms of this condition may include:  A feeling of fullness in the ear.  Ear pain.  Clicking  or popping noises in the ear.  Ringing in the ear.  Hearing loss.  Loss of balance.  Symptoms may get worse when the air pressure around you changes, such as when you travel to an area of high elevation or fly on an airplane. How is this diagnosed? This condition may be diagnosed based on:  Your symptoms.  A physical exam of your ear, nose, and throat.  Tests, such as those that measure: ? The movement of your eardrum (tympanogram). ? Your hearing (audiometry).  How is this treated? Treatment depends on the cause and severity of your condition. If your symptoms are mild, you may be able to relieve your symptoms by moving air into ("popping") your ears. If you have symptoms of fluid in your ears, treatment may include:  Decongestants.  Antihistamines.  Nasal sprays or ear drops that contain medicines that reduce swelling (steroids).  In some cases, you may need to have a procedure to drain the fluid in your eardrum (myringotomy). In this procedure, a small tube is placed in the eardrum to:  Drain the fluid.  Restore the air in the middle ear space.  Follow these instructions at home:  Take over-the-counter and prescription medicines only  as told by your health care provider.  Use techniques to help pop your ears as recommended by your health care provider. These may include: ? Chewing gum. ? Yawning. ? Frequent, forceful swallowing. ? Closing your mouth, holding your nose closed, and gently blowing as if you are trying to blow air out of your nose.  Do not do any of the following until your health care provider approves: ? Travel to high altitudes. ? Fly in airplanes. ? Work in a [employer] scheme manager or room. ? Scuba dive.  Keep your ears dry. Dry your ears completely after showering or bathing.  Do not smoke.  Keep all follow-up visits as told by your health care provider. This is important. Contact a health care provider if:  Your symptoms do not go away after  treatment.  Your symptoms come back after treatment.  You are unable to pop your ears.  You have: ? A fever. ? Pain in your ear. ? Pain in your head or neck. ? Fluid draining from your ear.  Your hearing suddenly changes.  You become very dizzy.  You lose your balance. This information is not intended to replace advice given to you by your health care provider. Make sure you discuss any questions you have with your health care provider. Document Released: 06/15/2015 Document Revised: 10/25/2015 Document Reviewed: 06/07/2014 Elsevier Interactive Patient Education  2018 [employer]. Vertigo Vertigo is the feeling that you or your surroundings are moving when they are not. Vertigo can be dangerous if it occurs while you are doing something that could endanger you or others, such as driving. What are the causes? This condition is caused by a disturbance in the signals that are sent by your body's sensory systems to your brain. Different causes of a disturbance can lead to vertigo, including:  Infections, especially in the inner ear.  A bad reaction to a drug, or misuse of alcohol and medicines.  Withdrawal from drugs or alcohol.  Quickly changing positions, as when lying down or rolling over in bed.  Migraine headaches.  Decreased blood flow to the brain.  Decreased blood pressure.  Increased pressure in the brain from a head or neck injury, stroke, infection, tumor, or bleeding.  Central nervous system disorders.  What are the signs or symptoms? Symptoms of this condition usually occur when you move your head or your eyes in different directions. Symptoms may start suddenly, and they usually last for less than a minute. Symptoms may include:  Loss of balance and falling.  Feeling like you are spinning or moving.  Feeling like your surroundings are spinning or moving.  Nausea and vomiting.  Blurred vision or double vision.  Difficulty hearing.  Slurred  speech.  Dizziness.  Involuntary eye movement (nystagmus).  Symptoms can be mild and cause only slight annoyance, or they can be severe and interfere with daily life. Episodes of vertigo may return (recur) over time, and they are often triggered by certain movements. Symptoms may improve over time. How is this diagnosed? This condition may be diagnosed based on medical history and the quality of your nystagmus. Your health care provider may test your eye movements by asking you to quickly change positions to trigger the nystagmus. This may be called the Dix-Hallpike test, head thrust test, or roll test. You may be referred to a health care provider who specializes in ear, nose, and throat (ENT) problems (otolaryngologist) or a provider who specializes in disorders of the central nervous system (neurologist). You may  have additional testing, including:  A physical exam.  Blood tests.  MRI.  A CT scan.  An electrocardiogram (ECG). This records electrical activity in your heart.  An electroencephalogram (EEG). This records electrical activity in your brain.  Hearing tests.  How is this treated? Treatment for this condition depends on the cause and the severity of the symptoms. Treatment options include:  Medicines to treat nausea or vertigo. These are usually used for severe cases. Some medicines that are used to treat other conditions may also reduce or eliminate vertigo symptoms. These include: ? Medicines that control allergies (antihistamines). ? Medicines that control seizures (anticonvulsants). ? Medicines that relieve depression (antidepressants). ? Medicines that relieve anxiety (sedatives).  Head movements to adjust your inner ear back to normal. If your vertigo is caused by an ear problem, your health care provider may recommend certain movements to correct the problem.  Surgery. This is rare.  Follow these instructions at home: Safety  Move slowly.Avoid sudden body or  head movements.  Avoid driving.  Avoid operating heavy machinery.  Avoid doing any tasks that would cause danger to you or others if you would have a vertigo episode during the task.  If you have trouble walking or keeping your balance, try using a cane for stability. If you feel dizzy or unstable, sit down right away.  Return to your normal activities as told by your health care provider. Ask your health care provider what activities are safe for you. General instructions  Take over-the-counter and prescription medicines only as told by your health care provider.  Avoid certain positions or movements as told by your health care provider.  Drink enough fluid to keep your urine clear or pale yellow.  Keep all follow-up visits as told by your health care provider. This is important. Contact a health care provider if:  Your medicines do not relieve your vertigo or they make it worse.  You have a fever.  Your condition gets worse or you develop new symptoms.  Your family or friends notice any behavioral changes.  Your nausea or vomiting gets worse.  You have numbness or a "pins and needles" sensation in part of your body. Get help right away if:  You have difficulty moving or speaking.  You are always dizzy.  You faint.  You develop severe headaches.  You have weakness in your hands, arms, or legs.  You have changes in your hearing or vision.  You develop a stiff neck.  You develop sensitivity to light. This information is not intended to replace advice given to you by your health care provider. Make sure you discuss any questions you have with your health care provider. Document Released: 02/26/2005 Document Revised: 10/31/2015 Document Reviewed: 09/11/2014 Elsevier Interactive Patient Education  2018 [place] in the Home Falls can cause injuries. They can happen to people of all ages. There are many things you can do to make your home safe  and to help prevent falls. What can I do on the outside of my home?  Regularly fix the edges of walkways and driveways and fix any cracks.  Remove anything that might make you trip as you walk through a door, such as a raised step or threshold.  Trim any bushes or trees on the path to your home.  Use bright outdoor lighting.  Clear any walking paths of anything that might make someone trip, such as rocks or tools.  Regularly check to see if handrails are loose  or broken. Make sure that both sides of any steps have handrails.  Any raised decks and porches should have guardrails on the edges.  Have any leaves, snow, or ice cleared regularly.  Use sand or salt on walking paths during winter.  Clean up any spills in your garage right away. This includes oil or grease spills. What can I do in the bathroom?  Use night lights.  Install grab bars by the toilet and in the tub and shower. Do not use towel bars as grab bars.  Use non-skid mats or decals in the tub or shower.  If you need to sit down in the shower, use a plastic, non-slip stool.  Keep the floor dry. Clean up any water that spills on the floor as soon as it happens.  Remove soap buildup in the tub or shower regularly.  Attach bath mats securely with double-sided non-slip rug tape.  Do not have throw rugs and other things on the floor that can make you trip. What can I do in the bedroom?  Use night lights.  Make sure that you have a light by your bed that is easy to reach.  Do not use any sheets or blankets that are too big for your bed. They should not hang down onto the floor.  Have a firm chair that has side arms. You can use this for support while you get dressed.  Do not have throw rugs and other things on the floor that can make you trip. What can I do in the kitchen?  Clean up any spills right away.  Avoid walking on wet floors.  Keep items that you use a lot in easy-to-reach places.  If you need to  reach something above you, use a strong step stool that has a grab bar.  Keep electrical cords out of the way.  Do not use floor polish or wax that makes floors slippery. If you must use wax, use non-skid floor wax.  Do not have throw rugs and other things on the floor that can make you trip. What can I do with my stairs?  Do not leave any items on the stairs.  Make sure that there are handrails on both sides of the stairs and use them. Fix handrails that are broken or loose. Make sure that handrails are as long as the stairways.  Check any carpeting to make sure that it is firmly attached to the stairs. Fix any carpet that is loose or worn.  Avoid having throw rugs at the top or bottom of the stairs. If you do have throw rugs, attach them to the floor with carpet tape.  Make sure that you have a light switch at the top of the stairs and the bottom of the stairs. If you do not have them, ask someone to add them for you. What else can I do to help prevent falls?  Wear shoes that: ? Do not have high heels. ? Have rubber bottoms. ? Are comfortable and fit you well. ? Are closed at the toe. Do not wear sandals.  If you use a stepladder: ? Make sure that it is fully opened. Do not climb a closed stepladder. ? Make sure that both sides of the stepladder are locked into place. ? Ask someone to hold it for you, if possible.  Clearly mark and make sure that you can see: ? Any grab bars or handrails. ? First and last steps. ? Where the edge of each step  is.  Use tools that help you move around (mobility aids) if they are needed. These include: ? Canes. ? Walkers. ? Scooters. ? Crutches.  Turn on the lights when you go into a dark area. Replace any light bulbs as soon as they burn out.  Set up your furniture so you have a clear path. Avoid moving your furniture around.  If any of your floors are uneven, fix them.  If there are any pets around you, be aware of where they  are.  Review your medicines with your doctor. Some medicines can make you feel dizzy. This can increase your chance of falling. Ask your doctor what other things that you can do to help prevent falls. This information is not intended to replace advice given to you by your health care provider. Make sure you discuss any questions you have with your health care provider. Document Released: 03/15/2009 Document Revised: 10/25/2015 Document Reviewed: 06/23/2014 Elsevier Interactive Patient Education  [employer].

## 2017-11-24 NOTE — Progress Notes (Signed)
Patient ID: [name], female    DOB: 1945-06-16, 71 y.o.   MRN: [id number]  PCP: Alycia Rossetti, MD  Chief Complaint  Patient presents with  . S/P fall - 11/19/17    Subjective:   [name] is a 71 y.o. female, presents to clinic with CC of back and neck pain s/p fall that occurred 11/19/17 (four days ago) when she was dozing off in her chair downstairs, she woke up having to urgently use the restroom, she stood up quickly and just fell over to the right, landing on her right hip and bottom on the concrete floor.  Somehow in the process she sustained an abraision on her left outer lower leg and she had the corner of the TV unit pressed against her back. She denies LOC, head injury.  She was sore 2 days later but then became much more sore with constant pain across her low back, right neck pain and stiffness, pain is contant, sore and tight, moderate to severe in nature, sitting, standing, walking- everything makes it worse.  No improvement with heating pad, topical lidocaine.  She also complains of continued vertigo, nasal congestion and some congestion, popping and ringing in her ears.  She is waiting for an ENT referral so she can get this checked.  She was recently seen for same complaints about 11 days ago, and before that, but a month and a half ago she had a upper respiratory infection in the middle of May, presented about 2 weeks later with ear congestion, head congestion and dizziness.  She reports that she still has not taken meclizine, she has had continued pressure in her sinuses and ears.  Per chart review she was instructed to use antihistamines and nasal sprays but she has not been using these and her symptoms have continued and worsened in her ears.  Only a few days ago she requested ENT referral, the preferred physician was not available and yesterday she requested a different physician to see and she is inquiring about status of her referral today  Patient Active  Problem List   Diagnosis Date Noted  . DDD (degenerative disc disease), lumbar 08/31/2017  . Generalized OA 08/31/2017  . Venous hypertension of both lower extremities 07/03/2017  . Osteopenia 05/13/2016  . History of left breast cancer 05/13/2016  . Glaucoma 05/13/2016  . Hyperlipidemia 05/13/2016  . Urinary incontinence 05/13/2016  . Tobacco use disorder 05/13/2016  . GERD (gastroesophageal reflux disease) 06/27/2013  . H/O: upper GI bleed 06/27/2013  . Epigastric pain 06/27/2013  . 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 Authorizing Provider  CALCIUM PO Take 1 tablet by mouth daily.   Yes [provider]  Cholecalciferol (VITAMIN D PO) Take 1 tablet by mouth daily.   Yes [provider]  clotrimazole-betamethasone (LOTRISONE) cream Apply 1 application topically 2 (two) times daily. Patient taking differently: Apply 1 application topically as needed.  11/14/16  Yes [place], Modena Nunnery, MD  esomeprazole (NEXIUM) 20 MG capsule Take 20 mg by mouth daily at 12 noon.   Yes [provider]  Magnesium 250 MG TABS Take by mouth.   Yes [provider]  meclizine (ANTIVERT) 12.5 MG tablet Take 1 tablet (12.5 mg total) by mouth 3 (three) times daily as needed for dizziness. 10/27/17  Yes [place], Modena Nunnery, MD  metoprolol succinate (TOPROL-XL) 25 MG 24 hr tablet Take 0.5 tablets (  12.5 mg total) by mouth daily. 06/01/17  Yes [place], Modena Nunnery, MD  Multiple Vitamin (MULTIVITAMIN WITH MINERALS) TABS tablet Take 1 tablet by mouth daily.   Yes [provider]  Multiple Vitamins-Minerals (PRESERVISION AREDS 2) CAPS Take by mouth 2 (two) times daily.    Yes [provider]  Omega-3 Fatty Acids (FISH OIL) 1000 MG CAPS Take by mouth daily.   Yes [provider]  Psyllium (METAMUCIL PO) Take by mouth 2 (two) times daily.   Yes [provider]  Red Yeast Rice  Extract (RED YEAST RICE PO) Take by mouth 2 (two) times daily.   Yes [provider]  timolol (TIMOPTIC) 0.5 % ophthalmic solution Place 1 drop into the left eye daily.  01/23/17  Yes [provider]  traMADol-acetaminophen (ULTRACET) 37.5-325 MG tablet Take 1 tablet by mouth 2 (two) times daily as needed. 08/31/17  Yes [place], Modena Nunnery, MD     Allergies  Allergen Reactions  . Sulfa Antibiotics Other (See Comments)    Childhood Allergy      Family History  Problem Relation Age of Onset  . Heart disease Mother   . Alzheimer's disease Mother   . Heart disease Father   . Lung disease Father   . Healthy Son      Social History   Socioeconomic History  . Marital status: Married    Spouse name: Not on file  . Number of children: Not on file  . Years of education: Not on file  . Highest education level: Not on file  Occupational History  . Not on file  Social Needs  . Financial resource strain: Not on file  . Food insecurity:    Worry: Not on file    Inability: Not on file  . Transportation needs:    Medical: Not on file    Non-medical: Not on file  Tobacco Use  . Smoking status: Current Some Day Smoker    Packs/day: 0.50    Years: 30.00    Pack years: 15.00    Types: Cigarettes  . Smokeless tobacco: Never Used  Substance and Sexual Activity  . Alcohol use: No    Alcohol/week: 0.0 oz  . Drug use: No  . Sexual activity: Not Currently  Lifestyle  . Physical activity:    Days per week: Not on file    Minutes per session: Not on file  . Stress: Not on file  Relationships  . Social connections:    Talks on phone: Not on file    Gets together: Not on file    Attends religious service: Not on file    Active member of club or organization: Not on file    Attends meetings of clubs or organizations: Not on file    Relationship status: Not on file  . Intimate partner violence:    Fear of current or ex partner: Not on file    Emotionally abused: Not on  file    Physically abused: Not on file    Forced sexual activity: Not on file  Other Topics Concern  . Not on file  Social History Narrative  . Not on file     Review of Systems  Constitutional: Negative.   HENT: Negative.   Eyes: Negative.   Respiratory: Negative.   Cardiovascular: Negative.   Gastrointestinal: Negative.   Endocrine: Negative.   Genitourinary: Negative.   Musculoskeletal: Positive for back pain and myalgias. Negative for gait problem and joint swelling.  Skin: Negative  for color change and wound.  Allergic/Immunologic: Negative.   Neurological: Negative.  Negative for tremors, syncope, weakness and numbness.  Hematological: Negative.   All other systems reviewed and are negative.      Objective:    Vitals:   11/24/17 0911  BP: 110/68  Pulse: (!) 104  Resp: 16  Temp: 98.3 F (36.8 C)  TempSrc: Oral  SpO2: 98%  Weight: 155 lb (70.3 kg)  Height: 5' 8.5" (1.74 m)      Physical Exam  Constitutional: She is oriented to person, place, and time. She appears well-developed and well-nourished. No distress.  Uncomfortable appearing elderly female, appears stated age, no distress, nontoxic-appearing  HENT:  Head: Normocephalic and atraumatic.  Right Ear: Tympanic membrane, external ear and ear canal normal.  Left Ear: Tympanic membrane, external ear and ear canal normal.  Mouth/Throat: Oropharynx is clear and moist. No oropharyngeal exudate.  Edematous nasal mucosa, enlarged turbinates  Eyes: Pupils are equal, round, and reactive to light. Conjunctivae, EOM and lids are normal. Right eye exhibits no discharge. Left eye exhibits no discharge. Right eye exhibits normal extraocular motion and no nystagmus. Left eye exhibits normal extraocular motion and no nystagmus.  Neck: Trachea normal and phonation normal. Muscular tenderness (right cervical paraspinal and trapezius muscles) present. No spinous process tenderness present. No neck rigidity. No tracheal  deviation and normal range of motion present.  Cardiovascular: Normal rate, regular rhythm, normal heart sounds and intact distal pulses. Exam reveals no gallop and no friction rub.  No murmur heard. Pulmonary/Chest: Effort normal. No stridor. No respiratory distress. She has no wheezes. She has no rales.  Abdominal: Soft. Bowel sounds are normal. She exhibits no distension. There is no tenderness.  Musculoskeletal: She exhibits no deformity.       Cervical back: Normal.       Thoracic back: Normal.       Lumbar back: She exhibits decreased range of motion and tenderness. She exhibits no bony tenderness, no swelling, no edema, no deformity, no laceration, no pain and no spasm.       Back:  Lymphadenopathy:    She has no cervical adenopathy.  Neurological: She is alert and oriented to person, place, and time. She has normal strength. She displays no tremor. No cranial nerve deficit or sensory deficit. She exhibits normal muscle tone. She displays no seizure activity. Coordination normal.  Antalgic gait  Skin: Skin is warm, dry and intact. Capillary refill takes less than 2 seconds. No abrasion, no bruising, no ecchymosis, no laceration and no rash noted. She is not diaphoretic. No cyanosis or erythema. No pallor. Nails show no clubbing.  Psychiatric: She has a normal mood and affect. Her behavior is normal. Judgment and thought content normal.  Nursing note and vitals reviewed.      Orthostatic negative    Assessment & Plan:      ICD-10-CM   1. Fall, initial encounter W19.XXXA   2. Lumbosacral strain, initial encounter S39.012A methocarbamol (ROBAXIN) 500 MG tablet  3. Rhinosinusitis J32.9 montelukast (SINGULAIR) 10 MG tablet    mometasone (NASONEX) 50 MCG/ACT nasal spray  4. Vertigo R42 Urinalysis, Routine w reflex microscopic    Urine Culture    72 year old patient presents with low back pain and right neck tightness status post fall 4 days ago.  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  She reports that she was dozing off  in a chair and stood up suddenly to run to the restroom and she lost her balance and fell over.  She denies any syncope or loss of consciousness, denies hitting her head.  X-rays were done yesterday of the hips, pelvis and coccyx and were all negative.  Patient reports gradual onset of pain that peaked about 3 days after her fall.  She has been using NSAIDs topical lidocaine and heating pad.  On exam she has no midline tenderness, no step-off, and very mild lumbar sacral paraspinal muscle tenderness and some very mild muscle tension and tenderness to her right neck and trapezius.  No neurological deficits.  Orthostatics were negative and reassuring.  He is continued to have some rhinosinusitis, does not been taking allergy medications as instructed.  Encouraged her to start again using antihistamines and steroid nasal sprays to help with her ear symptoms.  With episodes of vertigo she was encouraged to use meclizine also as prescribed. Did low-dose muscle relaxer to be used sparingly and with caution.  Patient verbalizes understanding.  Suspect this is normal muscle strain status post fall, discussed gradual healing time of several weeks to 2 months.  She was encouraged to continue NSAIDs as tolerated, heat therapy, topical lidocaine patches as directed over-the-counter.   She was instructed to follow-up or contact us in about 2 weeks if she is not starting to feel better and I believe physical therapy would help her, especially since she has history of degenerative disc disease.    Return precautions and ER precautions were reviewed and patient verbalizes understanding.  Delsa Grana, PA-C 11/24/17 9:32 AM

## 2017-11-24 NOTE — Telephone Encounter (Signed)
Noted, agree with xrays

## 2017-12-01 ENCOUNTER — Telehealth: Payer: Self-pay

## 2017-12-01 NOTE — Telephone Encounter (Signed)
Received fax from [place] that insurance is requiring patient try and fail fluticasone,flunisolide and azelastine before covering mometasone 50 mcg nasal spray

## 2017-12-02 NOTE — Telephone Encounter (Signed)
Can send other generic or pt to get over the counter, may be cheaper than her copay.

## 2017-12-07 NOTE — Telephone Encounter (Signed)
Call patient lvm that she could try otc nasal spray and that if she preferred a prescription for nasal spray then to call our office

## 2017-12-09 ENCOUNTER — Telehealth: Payer: Self-pay | Admitting: *Deleted

## 2017-12-09 NOTE — Telephone Encounter (Signed)
Received call from patient.   Reports that she was seen by PA for fall and lumbosacral pain. States that she was advised that PT may help with pain and is requesting referral for PT.   Ok to place orders?

## 2017-12-10 ENCOUNTER — Other Ambulatory Visit: Payer: Self-pay | Admitting: Family Medicine

## 2017-12-10 DIAGNOSIS — W19XXXA Unspecified fall, initial encounter: Secondary | ICD-10-CM

## 2017-12-10 DIAGNOSIS — S39012A Strain of muscle, fascia and tendon of lower back, initial encounter: Secondary | ICD-10-CM

## 2017-12-10 NOTE — Progress Notes (Signed)
Pt called requesting PT referral since it has been ~ 3 weeks and she is not yet back to baseline s/p fall.

## 2017-12-10 NOTE — Telephone Encounter (Signed)
Yes, I did see her for fall, dx with lumbosacral strain.  I put in PT referral.  Thank you

## 2017-12-11 DIAGNOSIS — H353221 Exudative age-related macular degeneration, left eye, with active choroidal neovascularization: Secondary | ICD-10-CM | POA: Diagnosis not present

## 2017-12-11 DIAGNOSIS — H35372 Puckering of macula, left eye: Secondary | ICD-10-CM | POA: Diagnosis not present

## 2017-12-11 DIAGNOSIS — H353112 Nonexudative age-related macular degeneration, right eye, intermediate dry stage: Secondary | ICD-10-CM | POA: Diagnosis not present

## 2017-12-14 DIAGNOSIS — R42 Dizziness and giddiness: Secondary | ICD-10-CM | POA: Diagnosis not present

## 2017-12-14 DIAGNOSIS — H903 Sensorineural hearing loss, bilateral: Secondary | ICD-10-CM | POA: Diagnosis not present

## 2017-12-17 ENCOUNTER — Encounter: Payer: Self-pay | Admitting: Family Medicine

## 2017-12-17 ENCOUNTER — Ambulatory Visit (INDEPENDENT_AMBULATORY_CARE_PROVIDER_SITE_OTHER): Payer: Medicare HMO | Admitting: Family Medicine

## 2017-12-17 ENCOUNTER — Other Ambulatory Visit: Payer: Self-pay

## 2017-12-17 VITALS — BP 128/64 | HR 110 | Temp 98.1°F | Resp 14 | Ht 68.5 in | Wt 158.0 lb

## 2017-12-17 DIAGNOSIS — M542 Cervicalgia: Secondary | ICD-10-CM

## 2017-12-17 DIAGNOSIS — Z9889 Other specified postprocedural states: Secondary | ICD-10-CM | POA: Diagnosis not present

## 2017-12-17 DIAGNOSIS — R42 Dizziness and giddiness: Secondary | ICD-10-CM

## 2017-12-17 DIAGNOSIS — M8589 Other specified disorders of bone density and structure, multiple sites: Secondary | ICD-10-CM | POA: Diagnosis not present

## 2017-12-17 NOTE — Progress Notes (Signed)
Patient ID: [name], female    DOB: 09/23/1945, 72 y.o.   MRN: [id number]  PCP: Alycia Rossetti, MD  Chief Complaint  Patient presents with  . Neck Pain    x5 days- crick in neck that has progressively worsend- states that she was bitten by something prior to stiffness in neck- decreased ROM- has tried heat/ lidocaine/ robaxin with no relief- has had surgery on vertebrae in the past- feels like something is grinding    Subjective:   [name] is a 72 y.o. female, presents to clinic with CC of 5 days of neck pain that started to have posterior left neck and then gradually wrapped around both sides the posterior neck located over paraspinal areas when patient points to it.  She states that 6 days ago she was downstairs in her basement and she felt something bite her on the neck so she slapped it the next morning when she woke up she felt a "crick in her neck" she describes as painful, tense with decreased range of motion and a sensation of grinding when she moves her head.  Pain is moderate to severe, is "keeping her up at night" no improvement with heat therapy, lidocaine topical cream, Robaxin.  She reports a history of cervical spinal surgery 20 years ago but does not know which vertebra or what procedure.  Does have a history of osteopenia, degenerative disc disease, also had lumbar spinal procedures and MRIs which she states were last in 2010 or 2011.   The pain does radiate up somewhat to the base of her school but she denies any headache any the other locations.  She denies any muscle tensions, muscle spasms, shooting tingling burning pain radiating down her arms, denies any numbness or weakness in arms.  Denies any visual disturbances, near-syncope, slurred speech, confusion, focal weakness.  She does have vertigo which she states is just being off balance, she does have to hold onto the wall to be able to walk.  She states she has a walker and a cane but she does not want to  use them.  She has dealt with vertigo for at least the past 3 months, currently is continuous difficulty with balance.  I have seen her in the past for this and she was referred to ENT and sinus congestion was treated.  ENT told her that she did not have vertigo and to go back to her PCP.  Today she states that she has had balance issues for at least 1.5 years.      Care everywhere encounters were reviewed, no available information with visits at [facility] with neurosurgery, dates 08/28/99 to 07/31/2004 with Dr. Bill Salinas. Last MRI of lumbar spine 05/10/09   Patient Active Problem List   Diagnosis Date Noted  . DDD (degenerative disc disease), lumbar 08/31/2017  . Generalized OA 08/31/2017  . Venous hypertension of both lower extremities 07/03/2017  . Osteopenia 05/13/2016  . History of left breast cancer 05/13/2016  . Glaucoma 05/13/2016  . Hyperlipidemia 05/13/2016  . Urinary incontinence 05/13/2016  . Tobacco use disorder 05/13/2016  . GERD (gastroesophageal reflux disease) 06/27/2013  . H/O: upper GI bleed 06/27/2013  . Epigastric pain 06/27/2013  . 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 Authorizing Provider  CALCIUM PO Take 1 tablet by mouth daily.   Yes [provider]  Cholecalciferol (VITAMIN D PO) Take 1  tablet by mouth daily.   Yes [provider]  clotrimazole-betamethasone (LOTRISONE) cream Apply 1 application topically 2 (two) times daily. Patient taking differently: Apply 1 application topically as needed.  11/14/16  Yes [place], Modena Nunnery, MD  esomeprazole (NEXIUM) 20 MG capsule Take 20 mg by mouth daily at 12 noon.   Yes [provider]  Magnesium 250 MG TABS Take by mouth.   Yes [provider]  meclizine (ANTIVERT) 12.5 MG tablet Take 1 tablet (12.5 mg total) by mouth 3 (three) times daily as needed for dizziness. 10/27/17  Yes [place], Modena Nunnery, MD  methocarbamol (ROBAXIN) 500 MG tablet Take 1 tablet (500 mg total) by mouth every 8 (eight) hours as needed for muscle spasms (muscle tightness). 11/24/17  Yes Delsa Grana, PA-C  metoprolol succinate (TOPROL-XL) 25 MG 24 hr tablet Take 0.5 tablets (12.5 mg total) by mouth daily. 06/01/17  Yes [place], Modena Nunnery, MD  mometasone (NASONEX) 50 MCG/ACT nasal spray Place 2 sprays into the nose daily. 11/24/17  Yes Delsa Grana, PA-C  montelukast (SINGULAIR) 10 MG tablet Take 1 tablet (10 mg total) by mouth at bedtime. 11/24/17  Yes Delsa Grana, PA-C  Multiple Vitamin (MULTIVITAMIN WITH MINERALS) TABS tablet Take 1 tablet by mouth daily.   Yes [provider]  Multiple Vitamins-Minerals (PRESERVISION AREDS 2) CAPS Take by mouth 2 (two) times daily.    Yes [provider]  Omega-3 Fatty Acids (FISH OIL) 1000 MG CAPS Take by mouth daily.   Yes [provider]  Psyllium (METAMUCIL PO) Take by mouth 2 (two) times daily.   Yes [provider]  Red Yeast Rice Extract (RED YEAST RICE PO) Take by mouth 2 (two) times daily.   Yes [provider]  timolol (TIMOPTIC) 0.5 % ophthalmic solution Place 1 drop into the left eye daily.  01/23/17  Yes [provider]  traMADol-acetaminophen (ULTRACET) 37.5-325 MG tablet Take 1 tablet by mouth 2 (two) times daily as needed. 08/31/17  Yes [place], Modena Nunnery, MD     Allergies  Allergen Reactions  . Sulfa Antibiotics Other (See Comments)    Childhood Allergy      Family History  Problem Relation Age of Onset  . Heart disease Mother   . Alzheimer's disease Mother   . Heart disease Father   . Lung disease Father   . Healthy Son      Social History   Socioeconomic History  . Marital status: Married    Spouse name: Not on file  . Number of children: Not on file  . Years of education: Not on file  . Highest education level: Not on file  Occupational History  . Not on file  Social Needs  . Financial  resource strain: Not on file  . Food insecurity:    Worry: Not on file    Inability: Not on file  . Transportation needs:    Medical: Not on file    Non-medical: Not on file  Tobacco Use  . Smoking status: Current Some Day Smoker    Packs/day: 0.50    Years: 30.00    Pack years: 15.00    Types: Cigarettes  . Smokeless tobacco: Never Used  Substance and Sexual Activity  . Alcohol use: No    Alcohol/week: 0.0 oz  . Drug use: No  . Sexual activity: Not Currently  Lifestyle  . Physical activity:    Days per week: Not on file    Minutes per session: Not on  file  . Stress: Not on file  Relationships  . Social connections:    Talks on phone: Not on file    Gets together: Not on file    Attends religious service: Not on file    Active member of club or organization: Not on file    Attends meetings of clubs or organizations: Not on file    Relationship status: Not on file  . Intimate partner violence:    Fear of current or ex partner: Not on file    Emotionally abused: Not on file    Physically abused: Not on file    Forced sexual activity: Not on file  Other Topics Concern  . Not on file  Social History Narrative  . Not on file     Review of Systems  Constitutional: Negative.  Negative for activity change, appetite change, chills, diaphoresis, fatigue, fever and unexpected weight change.  HENT: Negative.  Negative for congestion and ear pain.   Eyes: Negative for photophobia and visual disturbance.  Respiratory: Negative.   Cardiovascular: Negative.   Gastrointestinal: Negative.  Negative for nausea and vomiting.  Genitourinary: Negative.   Musculoskeletal: Positive for arthralgias, back pain, gait problem, neck pain and neck stiffness. Negative for joint swelling.  Skin: Negative.   Neurological: Negative for tremors, syncope, facial asymmetry, speech difficulty, weakness, light-headedness, numbness and headaches.  Psychiatric/Behavioral: Negative.   All other systems  reviewed and are negative.      Objective:    Vitals:   12/17/17 1108  BP: 128/64  Pulse: (!) 110  Resp: 14  Temp: 98.1 F (36.7 C)  TempSrc: Oral  SpO2: 95%  Weight: 158 lb (71.7 kg)  Height: 5' 8.5" (1.74 m)      Physical Exam  Constitutional: She is oriented to person, place, and time. She appears well-developed and well-nourished. No distress.  Stiff and mildly uncomfortable appearing elderly female, appears stated age, no distress, nontoxic-appearing  HENT:  Head: Normocephalic and atraumatic.  Right Ear: Tympanic membrane, external ear and ear canal normal.  Left Ear: Tympanic membrane, external ear and ear canal normal.  Mouth/Throat: No oropharyngeal exudate.  MMM  Eyes: Pupils are equal, round, and reactive to light. Conjunctivae, EOM and lids are normal. Right eye exhibits no discharge. Left eye exhibits no discharge. Right eye exhibits normal extraocular motion and no nystagmus. Left eye exhibits normal extraocular motion and no nystagmus.  Neck: Trachea normal and phonation normal. No tracheal tenderness, no spinous process tenderness and no muscular tenderness (right cervical paraspinal and trapezius muscles) present. Neck rigidity present. Decreased range of motion present. No tracheal deviation, no edema and no erythema present. No thyroid mass and no thyromegaly present.  Can rotate neck only 30 degrees to the right and left Left shoulder elevated roughly 1 inch higher than the right with tense trapezius extending from left shoulder to neck, no spasms palpated no tenderness to palpation  Cardiovascular: Normal rate, regular rhythm, normal heart sounds and intact distal pulses. Exam reveals no gallop and no friction rub.  No murmur heard. Pulmonary/Chest: Effort normal. No stridor. No respiratory distress. She has no wheezes. She has no rales.  Abdominal: Soft. Bowel sounds are normal. She exhibits no distension. There is no tenderness.  Musculoskeletal: She  exhibits no deformity.       Thoracic back: Normal.       Lumbar back: Normal.  Lymphadenopathy:    She has no cervical adenopathy.  Neurological: She is alert and oriented to person, place,  and time. She has normal strength. She displays no tremor. No cranial nerve deficit or sensory deficit. She exhibits normal muscle tone. She displays no seizure activity. Gait abnormal. Coordination normal.  No nystagmus, negative test of skew, unable to perform head impulse test due to neck pain and decreased ROM,  Ataxic slow gait, she hold onto walls or needs assistant with walking   Skin: Skin is warm, dry and intact. Capillary refill takes less than 2 seconds. No abrasion, no bruising, no ecchymosis, no laceration and no rash noted. She is not diaphoretic. No cyanosis or erythema. No pallor. Nails show no clubbing.  Psychiatric: She has a normal mood and affect. Her behavior is normal. Judgment and thought content normal.  Nursing note and vitals reviewed.         Assessment & Plan:      ICD-10-CM   1. Neck pain, acute M54.2 CT CERVICAL SPINE WO CONTRAST  2. Osteopenia of multiple sites M85.89   3. H/O neck surgery Z98.890   4. Vertigo R42    ongoing x 3 months    Patient is a 72 year old female presents with neck pain, stiffness, decreased range of motion and grinding sensation, onset 5 days ago, symptoms constant.  His past medical history of osteopenia, past cervical spinal surgery she states was probably 20 years ago, she does not know procedure or disc, but she has had other surgeries and MRIs of her lumbar spine over the past several years.  I saw pt recently for fall with lumbar strain and some neck stiffness at that time, during the visit on 11/24/17 she did have vertigo at that time and had hx of recent vertigo worked up by her PCP.  Today the patient is unable to stand steadily she does hold onto the wall brace herself, this appears worse than prior. She was referred to ENT by her PCP  and again by me and she reports that they just told her she did not have vertigo, that she was just "dizzy" and to follow up with PCP.   When seen recently by me and Dr. Buelah Manis she reported recent vertigo hx (currently 3 months) however today in the room she states vertigo sx x 1.5 years.  And the very first time she felt it was in 2011. She suspects hx of "mini stokes", has never been to neurology, and she denies any brain imaging in the past decade.   For today's visit will arrange imaging for her neck, feel that she will may require CT or MRI with a past medical history of surgery, osteopenia and other degenerative disc disease.  Does have limited range of motion, currently can only rotate neck about 30 degrees, baseline stiffness with extension of neck, surgical scar is over C5-T1, no midline tenderness or step-off from cervical to lumbar spine but she does have abnormal's spine curvature/kyphotic spine, left shoulder is elevated 1" higher than right, she states that is her baseline.  No cervical or thoracic muscle tenderness to palpation.    Patient encouraged to continue treatment that she has been doing at home, she did refuse any stronger pain medication, she is advised to apply ThermaCare heat patches to the area to see if it helps.  Will obtain CT cervical spine.  Patient did not want to get imaging today because she is going out to eat with her husband so we will try and arrange imaging in the next several days.  Discussed with her at length the need to use  a walker to stabilize her gait because she is very high fall risk and with her history of osteopenia and degenerative disc disease she is at extremely high risk for fall or femoral neck fracture which I explained to her could be catastrophic causing morbidity or mortality.    Also reviewed with her at length concerning signs or symptoms that she should go to the ER for immediately instead of waiting for outpatient imaging.  Symptoms such as  difficulty with speech, facial droop or asymmetry, focal weakness or numbness, headache, visual disturbances, pain or headache causing nausea or vomiting, any worsening of her gait.  Follow with her history of vertigo she likely needs MRI or neurology referral at this point but she does seem hesitant to see another specialist.  Compromise and I told her that I would consult her PCP for next appropriate step.   Delsa Grana, PA-C 12/17/17 11:14 AM

## 2017-12-21 ENCOUNTER — Ambulatory Visit (INDEPENDENT_AMBULATORY_CARE_PROVIDER_SITE_OTHER): Payer: Medicare HMO | Admitting: Internal Medicine

## 2017-12-22 ENCOUNTER — Ambulatory Visit (HOSPITAL_COMMUNITY): Payer: Medicare HMO

## 2017-12-23 ENCOUNTER — Ambulatory Visit (HOSPITAL_COMMUNITY): Payer: Medicare HMO | Attending: Family Medicine

## 2017-12-23 ENCOUNTER — Other Ambulatory Visit: Payer: Self-pay

## 2017-12-23 ENCOUNTER — Ambulatory Visit (INDEPENDENT_AMBULATORY_CARE_PROVIDER_SITE_OTHER): Payer: Medicare HMO | Admitting: Internal Medicine

## 2017-12-23 ENCOUNTER — Encounter (HOSPITAL_COMMUNITY): Payer: Self-pay

## 2017-12-23 DIAGNOSIS — R2689 Other abnormalities of gait and mobility: Secondary | ICD-10-CM | POA: Insufficient documentation

## 2017-12-23 DIAGNOSIS — M6281 Muscle weakness (generalized): Secondary | ICD-10-CM | POA: Insufficient documentation

## 2017-12-23 DIAGNOSIS — M545 Low back pain, unspecified: Secondary | ICD-10-CM

## 2017-12-23 NOTE — Therapy (Signed)
[place], [place], [address] Phone: [phone]   Fax:  [phone]  Physical Therapy Evaluation  Patient Details  Name: [name] MRN: [id number] Date of Birth: 74/09/1945 Referring Provider: Delsa Grana, PA-C   Encounter Date: 12/23/2017  PT End of Session - 12/23/17 1627    Visit Number  1    Number of Visits  13    Date for PT Re-Evaluation  02/03/18    Authorization Type  [employer] HMO (no auth required, no visit limit)    Authorization Time Period  12/23/17 - 02/05/18    Authorization - Visit Number  1    Authorization - Number of Visits  10    PT Start Time  1510    PT Stop Time  1603    PT Time Calculation (min)  53 min    Equipment Utilized During Treatment  Gait belt    Activity Tolerance  Patient tolerated treatment well    Behavior During Therapy  [facility] for tasks assessed/performed       Past Medical History:  Diagnosis Date  . Breast cancer ([place])    left breast/lumpectomy/chemo/rad 2003  . COPD (chronic obstructive pulmonary disease) ([place])   . DDD (degenerative disc disease), lumbar 08/31/2017  . Diverticulitis   . GERD (gastroesophageal reflux disease)   . Hemoptysis   . Hyperlipidemia   . Iron deficiency   . Personal history of radiation therapy 2003  . Pulmonary embolism ([place])   . Tachycardia     Past Surgical History:  Procedure Laterality Date  . BACK SURGERY    . BREAST LUMPECTOMY    . COLONOSCOPY  01/28/2012   Procedure: COLONOSCOPY;  Surgeon: Rogene Houston, MD;  [facility];  Service: Endoscopy;  Laterality: N/A;  1200  . COLONOSCOPY  01-2012  . ESOPHAGOGASTRODUODENOSCOPY N/A 07/01/2013   Procedure: ESOPHAGOGASTRODUODENOSCOPY (EGD);  Surgeon: Rogene Houston, MD;  [facility];  Service: Endoscopy;  Laterality: N/A;  1:10  . LAPAROSCOPIC TUBAL LIGATION  1970  . TONSILLECTOMY     Patient was age 72    There were no vitals filed for this  visit.   Subjective Assessment - 12/24/17 0935    Subjective  Patient reports she had a fall ~ 6-8 weeks ago while getting up from her computer chair. She states she fell backwards and landed on her buttocks on the concrete floor. She states she hit a table with a TV on it with her Rt hip/buttock but denies hitting her head. She states it took her about 20 minutes to get up and that because her husband is [facility] he could not hear her call for help. She then went to radiology and had x-rays on her hips and lumbar spine. They did not finds any acute fractures. She states in the last few weeks her low back pain has gotten better but she still feels like she has lost strength and has less endurance since the fall. She reports walking causes her to feel more tired and she cannot stand as long as she could prior to the fall. She states her doctor is sending her for an MRI on her neck as she smacked her neck hard ~ a week ago when she was swatting a bug away. She reports her neck has been sore but is much better now.     Limitations  Lifting;Standing;Walking;House hold activities    How long can you sit comfortably?  "  depends on the chair", supportive soft chair can sit as long as she wants to    How long can you stand comfortably?  about 15 minutes    How long can you walk comfortably?  10-15 minutes    Diagnostic tests  x-ray of hips and lumbar spine, no acute findings    Patient Stated Goals  to have better endurance and improved balance    Currently in Pain?  No/denies          [facility] PT Assessment - 12/23/17 0001   Assessment  Medical Diagnosis Lumbosacral strain  Referring Provider Delsa Grana, PA-C  Onset Date/Surgical Date 10/24/17 (approximate 6-8 weeks ago)  Hand Dominance Right  Prior Therapy prior PT for hip/LE  Precautions  Precautions None  Precaution Comments MRI on Monday neck  Restrictions  Weight Bearing Restrictions No  Balance Screen  Has the patient fallen in the past 6  months Yes  How many times? 1 (fall onto buttock while getting up from chair)  Has the patient had a decrease in activity level because of a fear of falling?  Yes  Is the patient reluctant to leave their home because of a fear of falling?  Yes  [place] residence  Living Arrangements Spouse/significant other  Available Help at Discharge Family  Type of [place] to enter  Entrance Stairs-Number of Steps [place] or work area in basement;One level  Research scientist (life sciences) - 2 wheels;Cane - single point (tub shower)  Additional Comments cat at home, sometimes gets underfoot  Prior Function  Level of Independence Independent  Vocation Retired  Leisure patient reports independent with mobility but decreased endurance with walking, reports she often "furniture surfs"  Cognition  Overall Cognitive Status Within Functional Limits for tasks assessed  Observation/Other Assessments  Focus on Therapeutic Outcomes (FOTO)  60% limited  Posture/Postural Control  Posture/Postural Control Postural limitations  Postural Limitations Forward head;Rounded Shoulders  ROM / Strength  AROM / PROM / Strength AROM;Strength  AROM  AROM Assessment Site Lumbar  Strength  Strength Assessment Site Hip;Knee;Ankle  Right/Left Knee Right;Left  Right Hip Flexion 4-/5  Right Hip Extension 3+/5  Right Hip ABduction 3+/5  Left Hip Flexion 4-/5  Left Hip Extension 3+/5  Left Hip ABduction 3+/5  Right Knee Flexion 4-/5  Right Knee Extension 4/5  Left Knee Flexion 4-/5  Left Knee Extension 4/5  Right Ankle Dorsiflexion 4+/5  Left Ankle Dorsiflexion 4+/5  Palpation  Spinal mobility  (did not assess, patient has osteopenia)  Palpation comment tenderness to palpation of gluetus maximus  Transfers  Five time sit to stand comments  33.4 with UE use to push up form arm rests  Comments patient unable to perform 1  transfer without UE assistance  Ambulation/Gait  Ambulation/Gait Yes  Ambulation/Gait Assistance 5: Supervision (pt with mild ataxia)  Ambulation/Gait Assistance Details guarding for safety  Ambulation Distance (Feet) 238 Feet  Assistive device None  Gait Pattern Step-through pattern;Decreased arm swing - right;Decreased arm swing - left;Decreased stride length;Decreased hip/knee flexion - right;Decreased hip/knee flexion - left;Ataxic;Poor foot clearance - left;Poor foot clearance - right  Ambulation Surface Level;Indoor  Gait velocity 0.57 m/s  Stairs Yes  Stairs Assistance 6: Modified independent (Device/Increase time)  Stair Management Technique Two rails;Step to pattern;Forwards (pt verbalized pulling with her arms to ascend stairs)  Number of Stairs 4  Height of Stairs 6  Gait Comments patient  with mild ataxia and poor foot clearance    Objective measurements completed on examination: See above findings.     PT Education - 12/23/17 1534    Education Details  Educated on overall exam findings and appropriate POC. Educated on benefits of aquatic therapy in addition to therapy at rehab office in gym.    Person(s) Educated  Patient    Methods  Explanation    Comprehension  Verbalized understanding       PT Short Term Goals - 12/23/17 1641      PT SHORT TERM GOAL #1   Title  Patient will be independent wtih HEP, updated PRN, to improve bil LE strength, balance, and functional mobility to reduce back pain and improve endurance.    Time  2    Period  Weeks    Status  New    Target Date  01/06/18      PT SHORT TERM GOAL #2   Title  Patient will improve MMT by 1/2 grade for limited groups in Bil LE to increase functional strenght for greater activity tolerance and walking endurance.    Time  3    Period  Weeks    Status  New    Target Date  01/13/18      PT SHORT TERM GOAL #3   Title  Patient will peform SLS on bil LE for 20 seconds to demonstrate improved balance and  safety for gait and stair mobility.    Time  3    Period  Weeks    Status  New        PT Long Term Goals - 12/23/17 1645      PT LONG TERM GOAL #1   Title  Patient will improve MMT by 1 grade for limited groups in Bil LE to increase functional strenght for greater activity tolerance and walking endurance.    Time  6    Period  Weeks    Status  New    Target Date  02/03/18      PT LONG TERM GOAL #2   Title  Patient will improve FOTO score by 10% to indicate decreased self reported limitations related to LBP and improved function with mobility.     Time  6    Period  Weeks    Status  New      PT LONG TERM GOAL #3   Title  Patient will ambulate at 1.0 m/s during 2MWT with LRAD and no signs of ataxia to demonstrate reduced fall risk and improved community mobility access.    Time  6    Period  Weeks    Status  New      PT LONG TERM GOAL #4   Title  Patient will improve TUG score by 6 seconds to demosntrate improve balance with gait and improve 5x sit to stand time by 6 seconds to demonstrate improve LE strength and improve function with transitional movements.    Time  6    Period  Weeks    Status  New        Plan - 12/23/17 1631    Clinical Impression Statement  Ms. Barbato presents for physical therapy evaluation for LBP after a fall ~ 6-8 weeks ago. She reports since her fall her back pain has improved in the last couple weeks but if she stands or walks for 10 minutes or more she notices her pain increase. She presents with bil LE weakness greatest for her hip muscle  groups, impaired balance, impaired gait, decreased activity tolerance and endurance, and difficulty with functional transfers and mobility. Her gait is mildly ataxic and all testing indicates she is at risk for falls. I educated her on benefits of using SPC or RW while ambulating to improve steadiness and reduce risk of falling. Ms. Chicas has expressed interest in aquatic therapy due to her back and knee pain  related to her osteoarthritis and for her balance deficits. She will benefit from both aquatic and land based physical therapy to challenge her in a safe environment and progress her strength, balance, and endurance to improve community mobility and reduce fall risk.    History and Personal Factors relevant to plan of care:  osteopenia    Clinical Presentation  Stable    Clinical Presentation due to:  MMT, 2MWT, TUG, 5xSit to Stand, SLS, FOTO, clinical judgement    Clinical Decision Making  Low    Rehab Potential  Good    PT Frequency  2x / week    PT Duration  6 weeks    PT Treatment/Interventions  ADLs/Self Care Home Management;Aquatic Therapy;Cryotherapy;Electrical Stimulation;Moist Heat;Gait training;DME Instruction;Stair training;Functional mobility training;Therapeutic activities;Therapeutic exercise;Balance training;Neuromuscular re-education;Patient/family education;Manual techniques;Passive range of motion;Taping    PT Next Visit Plan  Review eval and goals at next land based session and provide land based HEP. Initiated bil LE strengthening and balance training in SLS and tandem on solid surface. Initiate gait training with SPC to improve safety with community ambulation.    Consulted and Agree with Plan of Care  Patient       Patient will benefit from skilled therapeutic intervention in order to improve the following deficits and impairments:  Abnormal gait, Decreased endurance, Decreased activity tolerance, Decreased strength, Pain, Decreased balance, Decreased mobility, Difficulty walking, Decreased range of motion, Postural dysfunction, Impaired flexibility  Visit Diagnosis: Midline low back pain without sciatica, unspecified chronicity  Muscle weakness (generalized)  Other abnormalities of gait and mobility     Problem List Patient Active Problem List   Diagnosis Date Noted  . DDD (degenerative disc disease), lumbar 08/31/2017  . Generalized OA 08/31/2017  . Venous  hypertension of both lower extremities 07/03/2017  . Osteopenia 05/13/2016  . History of left breast cancer 05/13/2016  . Glaucoma 05/13/2016  . Hyperlipidemia 05/13/2016  . Urinary incontinence 05/13/2016  . Tobacco use disorder 05/13/2016  . GERD (gastroesophageal reflux disease) 06/27/2013  . H/O: upper GI bleed 06/27/2013  . Epigastric pain 06/27/2013  . 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

## 2017-12-24 ENCOUNTER — Telehealth (HOSPITAL_COMMUNITY): Payer: Self-pay

## 2017-12-24 NOTE — Telephone Encounter (Signed)
I called [name] to discuss her appointment for Monday 12/28/17 at 2:30 PM. It is an aquatic therapy appointment and I wanted to provide her with information on where to arrive and check in as well as the ability to use the family locker room if she would prefer that. She stated her understanding that she should arrive at the [facility] and check in at the front desk.   Kipp Brood, PT, DPT Physical Therapist with [facility]  12/24/2017 7:01 PM

## 2017-12-28 ENCOUNTER — Encounter (HOSPITAL_COMMUNITY): Payer: Self-pay

## 2017-12-28 ENCOUNTER — Other Ambulatory Visit: Payer: Self-pay

## 2017-12-28 ENCOUNTER — Ambulatory Visit (HOSPITAL_COMMUNITY): Admission: RE | Admit: 2017-12-28 | Payer: Medicare HMO | Source: Ambulatory Visit

## 2017-12-28 ENCOUNTER — Ambulatory Visit (HOSPITAL_COMMUNITY): Payer: Medicare HMO

## 2017-12-28 DIAGNOSIS — M545 Low back pain, unspecified: Secondary | ICD-10-CM

## 2017-12-28 DIAGNOSIS — M6281 Muscle weakness (generalized): Secondary | ICD-10-CM | POA: Diagnosis not present

## 2017-12-28 DIAGNOSIS — R2689 Other abnormalities of gait and mobility: Secondary | ICD-10-CM | POA: Diagnosis not present

## 2017-12-28 NOTE — Therapy (Signed)
[place], [place], [address] Phone: [phone]   Fax:  [phone]  Physical Therapy Aquatic Treatment  Patient Details  Name: [name] MRN: [id number] Date of Birth: 1945/08/16 Referring Provider: Delsa Grana, PA-C   Encounter Date: 12/28/2017  PT End of Session - 12/28/17 1613    Visit Number  2    Number of Visits  13    Date for PT Re-Evaluation  02/03/18    Authorization Type  [employer] HMO (no auth required, no visit limit)    Authorization Time Period  12/23/17 - 02/05/18    Authorization - Visit Number  2    Authorization - Number of Visits  10    PT Start Time  1884    PT Stop Time  1455    PT Time Calculation (min)  40 min    Equipment Utilized During Treatment  Gait belt    Activity Tolerance  Patient tolerated treatment well    Behavior During Therapy  [facility] for tasks assessed/performed       Past Medical History:  Diagnosis Date  . Breast cancer ([place])    left breast/lumpectomy/chemo/rad 2003  . COPD (chronic obstructive pulmonary disease) ([place])   . DDD (degenerative disc disease), lumbar 08/31/2017  . Diverticulitis   . GERD (gastroesophageal reflux disease)   . Hemoptysis   . Hyperlipidemia   . Iron deficiency   . Personal history of radiation therapy 2003  . Pulmonary embolism ([place])   . Tachycardia     Past Surgical History:  Procedure Laterality Date  . BACK SURGERY    . BREAST LUMPECTOMY    . COLONOSCOPY  01/28/2012   Procedure: COLONOSCOPY;  Surgeon: Rogene Houston, MD;  [facility];  Service: Endoscopy;  Laterality: N/A;  1200  . COLONOSCOPY  01-2012  . ESOPHAGOGASTRODUODENOSCOPY N/A 07/01/2013   Procedure: ESOPHAGOGASTRODUODENOSCOPY (EGD);  Surgeon: Rogene Houston, MD;  [facility];  Service: Endoscopy;  Laterality: N/A;  1:10  . LAPAROSCOPIC TUBAL LIGATION  1970  . TONSILLECTOMY     Patient was age 72    There were no vitals filed for this  visit.  Subjective Assessment - 12/28/17 1555    Subjective  Patient [facility] at [facility] for aquatic therpay appointment. She reports she was in a lot of pain following her evaluation but that it has improved since. She reports about 2-3/10 for her current pain level in her back. Patient stated she was worried we didn't have both of her insurances on file and that they need to be corrected, she would like to check this at her next appointment.    Limitations  Lifting;Standing;Walking;House hold activities    How long can you sit comfortably?  "depends on the chair", supportive soft chair can sit as long as she wants to    How long can you stand comfortably?  about 15 minutes    How long can you walk comfortably?  10-15 minutes    Diagnostic tests  x-ray of hips and lumbar spine, no acute findings    Patient Stated Goals  to have better endurance and improved balance    Currently in Pain?  Yes    Pain Score  3     Pain Location  Back    Pain Orientation  Lower    Pain Descriptors / Indicators  Aching;Nagging    Pain Type  -- sub-acute    Pain Onset  More  than a month ago    Pain Frequency  Constant    Aggravating Factors   walking    Pain Relieving Factors  rest, unsure    Effect of Pain on Daily Activities  moderate limitation       Adult Aquatic Therapy - 12/28/17 1602      Aquatic Therapy Subjective   Subjective  Patient arrives at [facility] for aquatic therpay appointment. She reports she was in a lot of pain following her evaluation but that it has improved since. She reports about 2-3/10 for her current pain level in her back. Patient stated she was worried we didn't have both of her insurances on file and that they need to be corrected, she would like to check this at her next appointment.      Treatment   Gait  Forward walking 3x RT in pool with kickboard for support. Side step along lane line 3xRT.    Specific Exercises  Hip/Low Back    Hip/Low Back  Pelvic tilt ant/post in minisquat  against pool wall: 3x 10 reps with manual facilitation and verbal/tactile cues. Pilates double arm press with 1 rainbow weight, 1x 12 reps. Pilates single leg stretch with noodle in front and support at back: 2x 10 reps Bil LE. Side kicks with 1 HHA at wall: 1x 12 reps bil LE (attempted side-front-back kick but this caused discomfort). Seated spine twist: 5 reps Rt/Lt with cue to sit tall and with noodle stretched in front.        PT Education - 12/28/17 1600    Education Details  Educated on purpose of aquatic therapy and benefits. Educated on exercise form throughout session. Educated patient that we will go over evaluation and goals at next session.    Person(s) Educated  Patient    Methods  Explanation    Comprehension  Verbalized understanding       PT Short Term Goals - 12/23/17 1641      PT SHORT TERM GOAL #1   Title  Patient will be independent wtih HEP, updated PRN, to improve bil LE strength, balance, and functional mobility to reduce back pain and improve endurance.    Time  2    Period  Weeks    Status  New    Target Date  01/06/18      PT SHORT TERM GOAL #2   Title  Patient will improve MMT by 1/2 grade for limited groups in Bil LE to increase functional strenght for greater activity tolerance and walking endurance.    Time  3    Period  Weeks    Status  New    Target Date  01/13/18      PT SHORT TERM GOAL #3   Title  Patient will peform SLS on bil LE for 20 seconds to demonstrate improved balance and safety for gait and stair mobility.    Time  3    Period  Weeks    Status  New        PT Long Term Goals - 12/23/17 1645      PT LONG TERM GOAL #1   Title  Patient will improve MMT by 1 grade for limited groups in Bil LE to increase functional strenght for greater activity tolerance and walking endurance.    Time  6    Period  Weeks    Status  New    Target Date  02/03/18      PT LONG TERM GOAL #2  Title  Patient will improve FOTO score by 10% to indicate  decreased self reported limitations related to LBP and improved function with mobility.     Time  6    Period  Weeks    Status  New      PT LONG TERM GOAL #3   Title  Patient will ambulate at 1.0 m/s during 2MWT with LRAD and no signs of ataxia to demonstrate reduced fall risk and improved community mobility access.    Time  6    Period  Weeks    Status  New      PT LONG TERM GOAL #4   Title  Patient will improve TUG score by 6 seconds to demosntrate improve balance with gait and improve 5x sit to stand time by 6 seconds to demonstrate improve LE strength and improve function with transitional movements.    Time  6    Period  Weeks    Status  New        Plan - 12/28/17 1614    Clinical Impression Statement  Initiated aquatic therapy this session. Interventions focused on core activation trunk/pelvis dissociation to improve proprioception of posture. She had great difficulty with post/ant pelvic tilt requiring manual assistance to obtain proper form. Exercises also incorporated LE strengthening and gentle spine stretch with emphasis on elongating spine throughout exercises. She reported reduction of pain from 2-3/10 at start of session to 0/10 at EOS. She will continue to benefit from skilled PT interventions to address current impairments and improve functional mobility.    Rehab Potential  Good    PT Frequency  2x / week    PT Duration  6 weeks    PT Treatment/Interventions  ADLs/Self Care Home Management;Aquatic Therapy;Cryotherapy;Electrical Stimulation;Moist Heat;Gait training;DME Instruction;Stair training;Functional mobility training;Therapeutic activities;Therapeutic exercise;Balance training;Neuromuscular re-education;Patient/family education;Manual techniques;Passive range of motion;Taping    PT Next Visit Plan  Review eval and goals at next land based session and provide land based HEP. Initiated bil LE strengthening and balance training in SLS and tandem on solid surface.  Initiate gait training with SPC to improve safety with community ambulation. Continue with pilates based program for aquatic therapy.    Consulted and Agree with Plan of Care  Patient       Patient will benefit from skilled therapeutic intervention in order to improve the following deficits and impairments:  Abnormal gait, Decreased endurance, Decreased activity tolerance, Decreased strength, Pain, Decreased balance, Decreased mobility, Difficulty walking, Decreased range of motion, Postural dysfunction, Impaired flexibility  Visit Diagnosis: No diagnosis found.     Problem List Patient Active Problem List   Diagnosis Date Noted  . DDD (degenerative disc disease), lumbar 08/31/2017  . Generalized OA 08/31/2017  . Venous hypertension of both lower extremities 07/03/2017  . Osteopenia 05/13/2016  . History of left breast cancer 05/13/2016  . Glaucoma 05/13/2016  . Hyperlipidemia 05/13/2016  . Urinary incontinence 05/13/2016  . Tobacco use disorder 05/13/2016  . GERD (gastroesophageal reflux disease) 06/27/2013  . H/O: upper GI bleed 06/27/2013  . Epigastric pain 06/27/2013  . 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 [place], [place], [address] Phone: [phone]   Fax:  [phone]  Name: [name] MRN: [id number] Date of Birth: Jul 25, 1945

## 2017-12-29 ENCOUNTER — Ambulatory Visit (HOSPITAL_COMMUNITY): Payer: Medicare HMO

## 2017-12-29 ENCOUNTER — Encounter (HOSPITAL_COMMUNITY): Payer: Self-pay

## 2017-12-29 VITALS — BP 129/68 | HR 94

## 2017-12-29 DIAGNOSIS — M545 Low back pain, unspecified: Secondary | ICD-10-CM

## 2017-12-29 DIAGNOSIS — R2689 Other abnormalities of gait and mobility: Secondary | ICD-10-CM | POA: Diagnosis not present

## 2017-12-29 DIAGNOSIS — M6281 Muscle weakness (generalized): Secondary | ICD-10-CM | POA: Diagnosis not present

## 2017-12-29 NOTE — Therapy (Signed)
[place], [place], [address] Phone: [phone]   Fax:  [phone]  Physical Therapy Treatment  Patient Details  Name: [name] MRN: [id number] Date of Birth: 09-17-1945 Referring Provider: Delsa Grana, PA-C   Encounter Date: 12/29/2017  PT End of Session - 12/29/17 1657    Visit Number  3    Number of Visits  13    Date for PT Re-Evaluation  02/03/18    Authorization Type  [employer] HMO (no auth required, no visit limit)    Authorization Time Period  12/23/17 - 02/05/18    Authorization - Visit Number  3    Authorization - Number of Visits  10    PT Start Time  8453    PT Stop Time  1648    PT Time Calculation (min)  43 min    Equipment Utilized During Treatment  Gait belt    Activity Tolerance  Patient tolerated treatment well pt limited by c/o dizziness with transitional movements and increased with corners during giat    Behavior During Therapy  [facility] for tasks assessed/performed       Past Medical History:  Diagnosis Date  . Breast cancer ([place])    left breast/lumpectomy/chemo/rad 2003  . COPD (chronic obstructive pulmonary disease) ([place])   . DDD (degenerative disc disease), lumbar 08/31/2017  . Diverticulitis   . GERD (gastroesophageal reflux disease)   . Hemoptysis   . Hyperlipidemia   . Iron deficiency   . Personal history of radiation therapy 2003  . Pulmonary embolism ([place])   . Tachycardia     Past Surgical History:  Procedure Laterality Date  . BACK SURGERY    . BREAST LUMPECTOMY    . COLONOSCOPY  01/28/2012   Procedure: COLONOSCOPY;  Surgeon: Rogene Houston, MD;  [facility];  Service: Endoscopy;  Laterality: N/A;  1200  . COLONOSCOPY  01-2012  . ESOPHAGOGASTRODUODENOSCOPY N/A 07/01/2013   Procedure: ESOPHAGOGASTRODUODENOSCOPY (EGD);  Surgeon: Rogene Houston, MD;  [facility];  Service: Endoscopy;  Laterality: N/A;  1:10  . LAPAROSCOPIC TUBAL LIGATION   1970  . TONSILLECTOMY     Patient was age 72    Vitals:   12/29/17 1608  BP: 129/68  Pulse: 94    Subjective Assessment - 12/29/17 1608    Subjective  Pt stated LBP pain scale 1-2/10 today.  No reoprts of recent fall, does c/o dizzy episodes with increased during NBOS and corners    Patient Stated Goals  to have better endurance and improved balance    Currently in Pain?  Yes    Pain Score  2     Pain Location  Back    Pain Orientation  Lower    Pain Descriptors / Indicators  Aching;Nagging    Pain Type  -- sub-acute    Pain Onset  More than a month ago    Pain Frequency  Constant    Aggravating Factors   walking    Pain Relieving Factors  rest, unsure    Effect of Pain on Daily Activities  moderate limation                       [facility] Adult PT Treatment/Exercise - 12/29/17 0001      Ambulation/Gait   Ambulation/Gait Assistance  5: Supervision    Ambulation Distance (Feet)  226 Feet    Assistive device  Straight cane  Ambulation Surface  Level;Indoor    Pre-Gait Activities  cueing for sequence and equal stride length    Gait Comments  pt c/o increased dizziness with corners      Exercises   Exercises  Lumbar      Lumbar Exercises: Supine   Ab Set  10 reps;5 seconds;Limitations    Pelvic Tilt  2 reps;10 reps    Pelvic Tilt Limitations  posterior pelvic tilt with verbal and tactile cueing    Bent Knee Raise  5 reps;5 seconds    Bent Knee Raise Limitations  with TrA activation    Bridge  10 reps             PT Education - 12/28/17 1600    Education Details  Educated on purpose of aquatic therapy and benefits. Educated on exercise form throughout session. Educated patient that we will go over evaluation and goals at next session.    Person(s) Educated  Patient    Methods  Explanation    Comprehension  Verbalized understanding       PT Short Term Goals - 12/23/17 1641      PT SHORT TERM GOAL #1   Title  Patient will be independent wtih  HEP, updated PRN, to improve bil LE strength, balance, and functional mobility to reduce back pain and improve endurance.    Time  2    Period  Weeks    Status  New    Target Date  01/06/18      PT SHORT TERM GOAL #2   Title  Patient will improve MMT by 1/2 grade for limited groups in Bil LE to increase functional strenght for greater activity tolerance and walking endurance.    Time  3    Period  Weeks    Status  New    Target Date  01/13/18      PT SHORT TERM GOAL #3   Title  Patient will peform SLS on bil LE for 20 seconds to demonstrate improved balance and safety for gait and stair mobility.    Time  3    Period  Weeks    Status  New        PT Long Term Goals - 12/23/17 1645      PT LONG TERM GOAL #1   Title  Patient will improve MMT by 1 grade for limited groups in Bil LE to increase functional strenght for greater activity tolerance and walking endurance.    Time  6    Period  Weeks    Status  New    Target Date  02/03/18      PT LONG TERM GOAL #2   Title  Patient will improve FOTO score by 10% to indicate decreased self reported limitations related to LBP and improved function with mobility.     Time  6    Period  Weeks    Status  New      PT LONG TERM GOAL #3   Title  Patient will ambulate at 1.0 m/s during 2MWT with LRAD and no signs of ataxia to demonstrate reduced fall risk and improved community mobility access.    Time  6    Period  Weeks    Status  New      PT LONG TERM GOAL #4   Title  Patient will improve TUG score by 6 seconds to demosntrate improve balance with gait and improve 5x sit to stand time by 6 seconds to demonstrate improve  LE strength and improve function with transitional movements.    Time  6    Period  Weeks    Status  New            Plan - 12/29/17 1700    Clinical Impression Statement  Reviewed goals and copy of eval given to pt.  Began session with education on proper core activation wiht multimodal cueing for proper muscle  activation and reduce compensation, pt best technique with posterior pelvic tilts.  Pt given core strengthening exercises for HEP, need to review next session.  Pt arrived without AD, reviewed safety with use of SPC to reduce risk of falls.  Min cueing for sequencing and equal stride length with cane, no LOB though pt did c/o dizziness through session especially with turning corners with gait.  Pt encouraged to utilize [facility] to reduce risk of fall.  Vitals taken, WNL.  Held balance activities due to c/o dizziness/lightheaded.      Rehab Potential  Good    PT Frequency  2x / week    PT Duration  6 weeks    PT Treatment/Interventions  ADLs/Self Care Home Management;Aquatic Therapy;Cryotherapy;Electrical Stimulation;Moist Heat;Gait training;DME Instruction;Stair training;Functional mobility training;Therapeutic activities;Therapeutic exercise;Balance training;Neuromuscular re-education;Patient/family education;Manual techniques;Passive range of motion;Taping    PT Next Visit Plan  Review compliance and technqiue wiht HEP.  Next session initiated bil LE strengthening and balance training in SLS and tandem on solid surface. Initiate gait training with SPC to improve safety with community ambulation. Continue with pilates based program for aquatic therapy.;    PT Home Exercise Plan  12/29/17: TrA activation, marching and bridges       Patient will benefit from skilled therapeutic intervention in order to improve the following deficits and impairments:  Abnormal gait, Decreased endurance, Decreased activity tolerance, Decreased strength, Pain, Decreased balance, Decreased mobility, Difficulty walking, Decreased range of motion, Postural dysfunction, Impaired flexibility  Visit Diagnosis: Midline low back pain without sciatica, unspecified chronicity  Muscle weakness (generalized)  Other abnormalities of gait and mobility     Problem List Patient Active Problem List   Diagnosis Date Noted  . DDD  (degenerative disc disease), lumbar 08/31/2017  . Generalized OA 08/31/2017  . Venous hypertension of both lower extremities 07/03/2017  . Osteopenia 05/13/2016  . History of left breast cancer 05/13/2016  . Glaucoma 05/13/2016  . Hyperlipidemia 05/13/2016  . Urinary incontinence 05/13/2016  . Tobacco use disorder 05/13/2016  . GERD (gastroesophageal reflux disease) 06/27/2013  . H/O: upper GI bleed 06/27/2013  . Epigastric pain 06/27/2013  . 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

## 2017-12-29 NOTE — Patient Instructions (Signed)
PELVIC TILT: Posterior    Tighten abdominals, flatten low back. 10 reps per set 5" holds,  1-2 sets per day, 4-6 days per week   Copyright  VHI. All rights reserved.   Bracing With Leg March (Hook-Lying)    With neutral spine, tighten pelvic floor and abdominals and hold. Alternating legs, lift foot 10 inches and return to floor. Repeat 5-10 times. Do 2 times a day.   Copyright  VHI. All rights reserved.   Bridge    Lie back, legs bent. Inhale, pressing hips up. Keeping ribs in, lengthen lower back. Exhale, rolling down along spine from top. Repeat 10 times. Do 1-2 sessions per day.  http://pm.exer.us/55   Copyright  VHI. All rights reserved.

## 2017-12-31 DIAGNOSIS — H401134 Primary open-angle glaucoma, bilateral, indeterminate stage: Secondary | ICD-10-CM | POA: Diagnosis not present

## 2017-12-31 DIAGNOSIS — H1852 Epithelial (juvenile) corneal dystrophy: Secondary | ICD-10-CM | POA: Diagnosis not present

## 2017-12-31 DIAGNOSIS — H04203 Unspecified epiphora, bilateral lacrimal glands: Secondary | ICD-10-CM | POA: Diagnosis not present

## 2017-12-31 DIAGNOSIS — H16223 Keratoconjunctivitis sicca, not specified as Sjogren's, bilateral: Secondary | ICD-10-CM | POA: Diagnosis not present

## 2018-01-01 ENCOUNTER — Other Ambulatory Visit: Payer: Self-pay

## 2018-01-01 ENCOUNTER — Encounter: Payer: Self-pay | Admitting: Family Medicine

## 2018-01-01 ENCOUNTER — Ambulatory Visit (INDEPENDENT_AMBULATORY_CARE_PROVIDER_SITE_OTHER): Payer: Medicare HMO | Admitting: Family Medicine

## 2018-01-01 VITALS — BP 120/68 | HR 90 | Temp 98.3°F | Resp 14 | Ht 68.5 in | Wt 160.0 lb

## 2018-01-01 DIAGNOSIS — E782 Mixed hyperlipidemia: Secondary | ICD-10-CM

## 2018-01-01 DIAGNOSIS — R42 Dizziness and giddiness: Secondary | ICD-10-CM

## 2018-01-01 DIAGNOSIS — M5136 Other intervertebral disc degeneration, lumbar region: Secondary | ICD-10-CM | POA: Diagnosis not present

## 2018-01-01 DIAGNOSIS — H409 Unspecified glaucoma: Secondary | ICD-10-CM

## 2018-01-01 DIAGNOSIS — I495 Sick sinus syndrome: Secondary | ICD-10-CM | POA: Diagnosis not present

## 2018-01-01 NOTE — Patient Instructions (Signed)
Get fasting labs in October  F/U December

## 2018-01-01 NOTE — Progress Notes (Signed)
   Subjective:    Patient ID: [name], female    DOB: September 05, 1945, 72 y.o.   MRN: [id number]  Patient presents for Follow-up (is not fasting)     Pt here to f/u chronic medical problems.    Medications reviewed.  Seen by ENT for her vertigo/ear popping seen by ENT, told no vertigo, told nasal rinse for post nasal drrainage, feels dizziness was not due to vestibular problems. She was suppose to have hearing test but placed referred to did not take insurance, she was given a few other options but decided to hold off for now    Currently in PT for her back, using cane, getting strengthening She is getting water therapy as well    Chronic neck pain- was set up for CT scan, but she declined once she got there, neck was improved   Hyperlipidemia- stopped red yeast rice, wants to see what it looks like off meds, she is taking fish oil    Glaucoma- she has finished macular degeneration shots- Dr. Baird Cancer, saw Dr. Herbert Deaner yesterday, continued on glaucoma drops     Still has problems with dizziness and balance but PT is working on it, has not tried the meclizine still   Review Of Systems:  GEN- denies fatigue, fever, weight loss,weakness, recent illness HEENT- denies eye drainage, change in vision, nasal discharge, CVS- denies chest pain, palpitations RESP- denies SOB, cough, wheeze ABD- denies N/V, change in stools, abd pain GU- denies dysuria, hematuria, dribbling, incontinence MSK- denies joint pain, muscle aches, injury Neuro- denies headache,+ dizziness, syncope, seizure activity       Objective:    BP 120/68   Pulse 90   Temp 98.3 F (36.8 C) (Oral)   Resp 14   Ht 5' 8.5" (1.74 m)   Wt 160 lb (72.6 kg)   SpO2 99%   BMI 23.97 kg/m  GEN- NAD, alert and oriented x3 HEENT- PERRL, EOMI, non injected sclera, pink conjunctiva, MMM, oropharynx clear Neck- Supple, no bruit CVS- RRR, no murmur RESP-CTAB ABD-NABS,soft,NT,ND Neuro-CNII-XII in tact, no deficits,walking  without cane today EXT- No edema Pulses- Radial 2+        Assessment & Plan:      Problem List Items Addressed This Visit      Unprioritized   DDD (degenerative disc disease), lumbar    Continue with PT ultracet prn      Dizziness    In PT for balance Recommend she try the meclizine Hold on MRI at this time, no other neurolgical signs Discussed that would be next step She wants to hold off for now, which is reasonable, she is not falling, no headaches,n o change in vision      Glaucoma    On drops      Relevant Medications   RESTASIS 0.05 % ophthalmic emulsion   Hyperlipidemia    Plan for labs in Oct, future orders placed      Relevant Orders   Lipid panel   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 Any transcriptional errors that result from this process are unintentional.

## 2018-01-03 ENCOUNTER — Encounter: Payer: Self-pay | Admitting: Family Medicine

## 2018-01-03 DIAGNOSIS — R42 Dizziness and giddiness: Secondary | ICD-10-CM | POA: Insufficient documentation

## 2018-01-03 NOTE — Assessment & Plan Note (Signed)
On drops 

## 2018-01-03 NOTE — Assessment & Plan Note (Signed)
Plan for labs in Oct, future orders placed

## 2018-01-03 NOTE — Assessment & Plan Note (Signed)
In PT for balance Recommend she try the meclizine Hold on MRI at this time, no other neurolgical signs Discussed that would be next step She wants to hold off for now, which is reasonable, she is not falling, no headaches,n o change in vision

## 2018-01-03 NOTE — Assessment & Plan Note (Signed)
Controlled per cardiology, no symptoms

## 2018-01-03 NOTE — Assessment & Plan Note (Signed)
Continue with PT ultracet prn

## 2018-01-04 ENCOUNTER — Ambulatory Visit (HOSPITAL_COMMUNITY): Payer: Medicare HMO | Attending: Family Medicine

## 2018-01-04 ENCOUNTER — Encounter (HOSPITAL_COMMUNITY): Payer: Self-pay

## 2018-01-04 ENCOUNTER — Other Ambulatory Visit: Payer: Self-pay

## 2018-01-04 DIAGNOSIS — M6281 Muscle weakness (generalized): Secondary | ICD-10-CM | POA: Diagnosis not present

## 2018-01-04 DIAGNOSIS — M545 Low back pain, unspecified: Secondary | ICD-10-CM

## 2018-01-04 DIAGNOSIS — R2689 Other abnormalities of gait and mobility: Secondary | ICD-10-CM

## 2018-01-04 NOTE — Therapy (Signed)
[place], [place], [address] Phone: [phone]   Fax:  [phone]  Physical Therapy Treatment  Patient Details  Name: [name] MRN: [id number] Date of Birth: 1945/09/13  Referring Provider: Delsa Grana, PA-C   Encounter Date: 01/04/2018  PT End of Session - 01/04/18 1634    Visit Number  4    Number of Visits  13    Date for PT Re-Evaluation  02/03/18    Authorization Type  [employer] H[employer] (no auth required, no visit limit)    Authorization Time Period  12/23/17 - 02/05/18    Authorization - Visit Number  4    Authorization - Number of Visits  10    PT Start Time  7035    PT Stop Time  1515    PT Time Calculation (min)  42 min    Equipment Utilized During Treatment  Gait belt    Activity Tolerance  Patient tolerated treatment well    Behavior During Therapy  [facility] for tasks assessed/performed       Past Medical History:  Diagnosis Date  . Breast cancer ([place])    left breast/lumpectomy/chemo/rad 2003  . COPD (chronic obstructive pulmonary disease) ([place])   . DDD (degenerative disc disease), lumbar 08/31/2017  . Diverticulitis   . GERD (gastroesophageal reflux disease)   . Hemoptysis   . Hyperlipidemia   . Iron deficiency   . Personal history of radiation therapy 2003  . Pulmonary embolism ([place])   . Tachycardia     Past Surgical History:  Procedure Laterality Date  . BACK SURGERY    . BREAST LUMPECTOMY    . COLONOSCOPY  01/28/2012   Procedure: COLONOSCOPY;  Surgeon: Rogene Houston, MD;  [facility];  Service: Endoscopy;  Laterality: N/A;  1200  . COLONOSCOPY  01-2012  . ESOPHAGOGASTRODUODENOSCOPY N/A 07/01/2013   Procedure: ESOPHAGOGASTRODUODENOSCOPY (EGD);  Surgeon: Rogene Houston, MD;  [facility];  Service: Endoscopy;  Laterality: N/A;  1:10  . LAPAROSCOPIC TUBAL LIGATION  1970  . TONSILLECTOMY     Patient was age 72    There were no vitals filed for this  visit.  Subjective Assessment - 01/04/18 1632    Subjective  Patient arrives at [facility] for aquatic therapy appointment. She arrives with no pain this date and states that she has been feeling better since starting therapy. She states last week after her aquatic therapy she went to [employer] and that walking in and out of the store was a lot of work for her and increased her pain.    Limitations  Lifting;Standing;Walking;House hold activities    How long can you sit comfortably?  "depends on the chair", supportive soft chair can sit as long as she wants to    How long can you stand comfortably?  about 15 minutes    How long can you walk comfortably?  10-15 minutes    Diagnostic tests  x-ray of hips and lumbar spine, no acute findings    Patient Stated Goals  to have better endurance and improved balance    Currently in Pain?  No/denies       Adult Aquatic Therapy - 01/04/18 1637      Aquatic Therapy Subjective   Subjective  Patient arrives at [facility] for aquatic therapy appointment. She arrives with no pain this date and states that she has been feeling better since starting therapy. She states last week after her aquatic  therapy she went to [facility] and that walking in and out of the store was a lot of work for her.      Treatment   Gait  Side step along lane line 3xRT.    Specific Exercises  Hip/Low Back    Hip/Low Back  Pelvic tilt ant/post in minisquat against pool wall: 2x 15 reps with verbal/tactile cues. Pilates double arm press with 1 rainbow weight while maintaining TA activation, 2x 12 reps. Pilates single leg stretch with noodle in front and support at back: 2x 10 reps Bil LE. Leg front pull with 2 HHA at wall: 1x 12 reps bil LE. Scissors: 2x 15 reps with noodle and support at back. Leg press with noodle: 2x 12 reps bil LE. LAQ in marching with TA activation against wall for support ([place]): 2x 12 reps bil. Saw with noodle: 1x 10 reps bil.         PT Education - 01/04/18 1634     Education Details  Educated on exercises throughout session and on progression to increase challenge with exercises.    Person(s) Educated  Patient    Methods  Explanation;Demonstration    Comprehension  Verbalized understanding;Returned demonstration       PT Short Term Goals - 12/23/17 1641      PT SHORT TERM GOAL #1   Title  Patient will be independent wtih HEP, updated PRN, to improve bil LE strength, balance, and functional mobility to reduce back pain and improve endurance.    Time  2    Period  Weeks    Status  New    Target Date  01/06/18      PT SHORT TERM GOAL #2   Title  Patient will improve MMT by 1/2 grade for limited groups in Bil LE to increase functional strenght for greater activity tolerance and walking endurance.    Time  3    Period  Weeks    Status  New    Target Date  01/13/18      PT SHORT TERM GOAL #3   Title  Patient will peform SLS on bil LE for 20 seconds to demonstrate improved balance and safety for gait and stair mobility.    Time  3    Period  Weeks    Status  New        PT Long Term Goals - 12/23/17 1645      PT LONG TERM GOAL #1   Title  Patient will improve MMT by 1 grade for limited groups in Bil LE to increase functional strenght for greater activity tolerance and walking endurance.    Time  6    Period  Weeks    Status  New    Target Date  02/03/18      PT LONG TERM GOAL #2   Title  Patient will improve FOTO score by 10% to indicate decreased self reported limitations related to LBP and improved function with mobility.     Time  6    Period  Weeks    Status  New      PT LONG TERM GOAL #3   Title  Patient will ambulate at 1.0 m/s during 2MWT with LRAD and no signs of ataxia to demonstrate reduced fall risk and improved community mobility access.    Time  6    Period  Weeks    Status  New      PT LONG TERM GOAL #4   Title  Patient  will improve TUG score by 6 seconds to demosntrate improve balance with gait and improve 5x sit to  stand time by 6 seconds to demonstrate improve LE strength and improve function with transitional movements.    Time  6    Period  Weeks    Status  New        Plan - 01/04/18 1636    Clinical Impression Statement  Continued aquatic therapy this session. Interventions continue to focus on core activation and trunk/pelvis dissociation. She had improved form with post/ant pelvic tilt and required fewer cues, however motion is still limited with minimal range. Exercises also incorporated LE strengthening and gentle spine stretch with emphasis on elongating spine throughout pilates based exercises. Denied pain at EOS and progressed abdominal/oblique strengthening with "saw" exercise this date with no difficulty. She will continue to benefit from skilled PT interventions to address current impairments and improve functional mobility.    Rehab Potential  Good    PT Frequency  2x / week    PT Duration  6 weeks    PT Treatment/Interventions  ADLs/Self Care Home Management;Aquatic Therapy;Cryotherapy;Electrical Stimulation;Moist Heat;Gait training;DME Instruction;Stair training;Functional mobility training;Therapeutic activities;Therapeutic exercise;Balance training;Neuromuscular re-education;Patient/family education;Manual techniques;Passive range of motion;Taping    PT Next Visit Plan  Review compliance and technique with HEP.  Next session initiated bil LE strengthening and balance training in SLS and tandem on solid surface. Initiate gait training with SPC to improve safety with community ambulation. Continue with pilates based program for aquatic therapy.    PT Home Exercise Plan  12/29/17: TrA activation, marching and bridges    Consulted and Agree with Plan of Care  Patient       Patient will benefit from skilled therapeutic intervention in order to improve the following deficits and impairments:  Abnormal gait, Decreased endurance, Decreased activity tolerance, Decreased strength, Pain, Decreased  balance, Decreased mobility, Difficulty walking, Decreased range of motion, Postural dysfunction, Impaired flexibility  Visit Diagnosis: Midline low back pain without sciatica, unspecified chronicity  Muscle weakness (generalized)  Other abnormalities of gait and mobility     Problem List Patient Active Problem List   Diagnosis Date Noted  . Dizziness 01/03/2018  . DDD (degenerative disc disease), lumbar 08/31/2017  . Generalized OA 08/31/2017  . Venous hypertension of both lower extremities 07/03/2017  . Osteopenia 05/13/2016  . History of left breast cancer 05/13/2016  . Glaucoma 05/13/2016  . Hyperlipidemia 05/13/2016  . Urinary incontinence 05/13/2016  . Tobacco use disorder 05/13/2016  . GERD (gastroesophageal reflux disease) 06/27/2013  . H/O: upper GI bleed 06/27/2013  . Epigastric pain 06/27/2013  . 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 [place], [place], [address] Phone: [phone]   Fax:  [phone]  Name: LEONILA SPERANZA MRN: [id number] Date of Birth: 02/09/1946

## 2018-01-06 ENCOUNTER — Ambulatory Visit (HOSPITAL_COMMUNITY): Payer: Medicare HMO

## 2018-01-06 ENCOUNTER — Encounter (HOSPITAL_COMMUNITY): Payer: Self-pay

## 2018-01-06 ENCOUNTER — Other Ambulatory Visit: Payer: Self-pay

## 2018-01-06 DIAGNOSIS — M545 Low back pain, unspecified: Secondary | ICD-10-CM

## 2018-01-06 DIAGNOSIS — M6281 Muscle weakness (generalized): Secondary | ICD-10-CM

## 2018-01-06 DIAGNOSIS — R2689 Other abnormalities of gait and mobility: Secondary | ICD-10-CM

## 2018-01-06 NOTE — Therapy (Signed)
[place], [place], [address] Phone: [phone]   Fax:  [phone]  Physical Therapy Treatment  Patient Details  Name: [name] MRN: [id number] Date of Birth: [birth year] Referring Provider: Delsa Grana, PA-C   Encounter Date: 01/06/2018  PT End of Session - 01/06/18 1531    Visit Number  5    Number of Visits  13    Date for PT Re-Evaluation  02/03/18    Authorization Type  [employer] HMO (no auth required, no visit limit)    Authorization Time Period  12/23/17 - 02/05/18    Authorization - Visit Number  5    Authorization - Number of Visits  10    PT Start Time  6962    PT Stop Time  9528 additional 5 minutes spent resting legs elevated (pt left at 1615)    PT Time Calculation (min)  45 min    Equipment Utilized During Treatment  Gait belt    Activity Tolerance  Patient tolerated treatment well    Behavior During Therapy  [facility] for tasks assessed/performed       Past Medical History:  Diagnosis Date  . Breast cancer ([place])    left breast/lumpectomy/chemo/rad 2003  . COPD (chronic obstructive pulmonary disease) ([place])   . DDD (degenerative disc disease), lumbar 08/31/2017  . Diverticulitis   . GERD (gastroesophageal reflux disease)   . Hemoptysis   . Hyperlipidemia   . Iron deficiency   . Personal history of radiation therapy 2003  . Pulmonary embolism ([place])   . Tachycardia     Past Surgical History:  Procedure Laterality Date  . BACK SURGERY    . BREAST LUMPECTOMY    . COLONOSCOPY  01/28/2012   Procedure: COLONOSCOPY;  Surgeon: Rogene Houston, MD;  [facility];  Service: Endoscopy;  Laterality: N/A;  1200  . COLONOSCOPY  01-2012  . ESOPHAGOGASTRODUODENOSCOPY N/A 07/01/2013   Procedure: ESOPHAGOGASTRODUODENOSCOPY (EGD);  Surgeon: Rogene Houston, MD;  Location: [facility];  Service: Endoscopy;  Laterality: N/A;  1:10  . LAPAROSCOPIC TUBAL LIGATION  1970  . TONSILLECTOMY      Patient was age 72    There were no vitals filed for this visit.  Subjective Assessment - 01/06/18 1526    Subjective  Patient reports she was sore Monday night after aquatic therapy. She states she used heat after and it helped with her pain. She states it has gotten better since Monday but is around a 3/10.     Limitations  Lifting;Standing;Walking;House hold activities    How long can you sit comfortably?  "depends on the chair", supportive soft chair can sit as long as she wants to    How long can you stand comfortably?  about 15 minutes    How long can you walk comfortably?  10-15 minutes    Diagnostic tests  x-ray of hips and lumbar spine, no acute findings    Patient Stated Goals  to have better endurance and improved balance    Currently in Pain?  Yes    Pain Score  3     Pain Location  Back    Pain Orientation  Lower    Pain Descriptors / Indicators  Aching;Nagging    Pain Type  Chronic pain    Pain Onset  More than a month ago    Pain Frequency  Constant    Aggravating Factors   walking, extension positions  Pain Relieving Factors  rest, heat    Effect of Pain on Daily Activities  moderate limitation       [facility] Adult PT Treatment/Exercise - 01/06/18 0001      Lumbar Exercises: Standing   Heel Raises  15 reps;3 seconds    Other Standing Lumbar Exercises  SLS 2 reps bil LE to instruct on HEP      Lumbar Exercises: Supine   Pelvic Tilt  15 reps;Limitations    Pelvic Tilt Limitations  anterior/posterior pelvic tilt, 3 seconds each position    Bent Knee Raise  10 reps    Bent Knee Raise Limitations  with TrA activation    Bridge  10 reps;Limitations    Bridge Limitations  2 sets      Modalities   Modalities  Moist Heat      Moist Heat Therapy   Number Minutes Moist Heat  5 Minutes    Moist Heat Location  Lumbar Spine      Manual Therapy   Manual Therapy  Soft tissue mobilization    Manual therapy comments  performed seperate form other interventions    Soft  tissue mobilization  lumbar paraspinals and quadratus lumborem to address muscel restrictions and trigger points.        PT Education - 01/06/18 1533    Education Details  Educated on DOMS and that it should resolve in about 2 days. Continued to educate on exercises and provide cues for form.     Person(s) Educated  Patient    Methods  Explanation;Tactile cues;Verbal cues    Comprehension  Verbalized understanding;Returned demonstration       PT Short Term Goals - 12/23/17 1641      PT SHORT TERM GOAL #1   Title  Patient will be independent wtih HEP, updated PRN, to improve bil LE strength, balance, and functional mobility to reduce back pain and improve endurance.    Time  2    Period  Weeks    Status  New    Target Date  01/06/18      PT SHORT TERM GOAL #2   Title  Patient will improve MMT by 1/2 grade for limited groups in Bil LE to increase functional strenght for greater activity tolerance and walking endurance.    Time  3    Period  Weeks    Status  New    Target Date  01/13/18      PT SHORT TERM GOAL #3   Title  Patient will peform SLS on bil LE for 20 seconds to demonstrate improved balance and safety for gait and stair mobility.    Time  3    Period  Weeks    Status  New        PT Long Term Goals - 12/23/17 1645      PT LONG TERM GOAL #1   Title  Patient will improve MMT by 1 grade for limited groups in Bil LE to increase functional strenght for greater activity tolerance and walking endurance.    Time  6    Period  Weeks    Status  New    Target Date  02/03/18      PT LONG TERM GOAL #2   Title  Patient will improve FOTO score by 10% to indicate decreased self reported limitations related to LBP and improved function with mobility.     Time  6    Period  Weeks    Status  New  PT LONG TERM GOAL #3   Title  Patient will ambulate at 1.0 m/s during 2MWT with LRAD and no signs of ataxia to demonstrate reduced fall risk and improved community mobility  access.    Time  6    Period  Weeks    Status  New      PT LONG TERM GOAL #4   Title  Patient will improve TUG score by 6 seconds to demosntrate improve balance with gait and improve 5x sit to stand time by 6 seconds to demonstrate improve LE strength and improve function with transitional movements.    Time  6    Period  Weeks    Status  New        Plan - 01/06/18 1531    Clinical Impression Statement  Began session with moist heat to low back as patient complained of greater pain from last session. Soft tissue mobilization performed to address trigger points in patient's low back and she reported great relief in pain from this. Continued with core strengthening for abdominals and glutes and progress repetitions. She demonstrated improved carry over with pelvic tilt exercises and with TA activation this session. This session initiated SLS training t counter and was updated to include in HEP. Patient required intermittent UE support. Patient was limited at EOS by pain in her lower legs which she stated was related to her deep vein reflux. She rested in supine with bil LE's elevated and pain resolved in ~ 2 minutes. I educated her to put her compression stockings on when she returns home and elevate her LE's if her pain increases again. She will continue to benefit from skilled PT interventions to address current impairments and improve functional mobility.    Rehab Potential  Good    PT Frequency  2x / week    PT Duration  6 weeks    PT Treatment/Interventions  ADLs/Self Care Home Management;Aquatic Therapy;Cryotherapy;Electrical Stimulation;Moist Heat;Gait training;DME Instruction;Stair training;Functional mobility training;Therapeutic activities;Therapeutic exercise;Balance training;Neuromuscular re-education;Patient/family education;Manual techniques;Passive range of motion;Taping    PT Next Visit Plan  Next session initiated bil LE strengthening and balance training in SLS and tandem on solid  surface. Continue gait training with SPC to improve safety with community ambulation and with core stabilization exercises.  Continue with pilates based program for aquatic therapy.    PT Home Exercise Plan  12/29/17: TrA activation, marching and bridges; 01/06/18 - SLS with counter support    Consulted and Agree with Plan of Care  Patient       Patient will benefit from skilled therapeutic intervention in order to improve the following deficits and impairments:  Abnormal gait, Decreased endurance, Decreased activity tolerance, Decreased strength, Pain, Decreased balance, Decreased mobility, Difficulty walking, Decreased range of motion, Postural dysfunction, Impaired flexibility  Visit Diagnosis: Midline low back pain without sciatica, unspecified chronicity  Muscle weakness (generalized)  Other abnormalities of gait and mobility     Problem List Patient Active Problem List   Diagnosis Date Noted  . Dizziness 01/03/2018  . DDD (degenerative disc disease), lumbar 08/31/2017  . Generalized OA 08/31/2017  . Venous hypertension of both lower extremities 07/03/2017  . Osteopenia 05/13/2016  . History of left breast cancer 05/13/2016  . Glaucoma 05/13/2016  . Hyperlipidemia 05/13/2016  . Urinary incontinence 05/13/2016  . Tobacco use disorder 05/13/2016  . GERD (gastroesophageal reflux disease) 06/27/2013  . H/O: upper GI bleed 06/27/2013  . Epigastric pain 06/27/2013  . 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 [place], [place], [address] Phone: [phone]   Fax:  [phone]  Name: [name] MRN: [id number] Date of Birth: 07-05-1945

## 2018-01-11 ENCOUNTER — Ambulatory Visit (HOSPITAL_COMMUNITY): Payer: Medicare HMO

## 2018-01-11 ENCOUNTER — Other Ambulatory Visit: Payer: Self-pay

## 2018-01-11 ENCOUNTER — Encounter (HOSPITAL_COMMUNITY): Payer: Self-pay

## 2018-01-11 DIAGNOSIS — M545 Low back pain, unspecified: Secondary | ICD-10-CM

## 2018-01-11 DIAGNOSIS — M6281 Muscle weakness (generalized): Secondary | ICD-10-CM

## 2018-01-11 DIAGNOSIS — R2689 Other abnormalities of gait and mobility: Secondary | ICD-10-CM

## 2018-01-11 NOTE — Therapy (Addendum)
[place], [place], [address] Phone: [phone]   Fax:  [phone]  Physical Therapy Aquatic Treatment  Patient Details  Name: [name] MRN: [id number] Date of Birth: Oct 06, 1945 Referring Provider: Delsa Grana, PA-C   Encounter Date: 01/11/2018  PT End of Session - 01/11/18 1550    Visit Number  6    Number of Visits  13    Date for PT Re-Evaluation  02/03/18    Authorization Type  [employer] H[employer] (no auth required, no visit limit)    Authorization Time Period  12/23/17 - 02/05/18    Authorization - Visit Number  6    Authorization - Number of Visits  10    PT Start Time  2297    PT Stop Time  1516    PT Time Calculation (min)  45 min    Equipment Utilized During Treatment  Gait belt    Activity Tolerance  Patient tolerated treatment well    Behavior During Therapy  [facility] for tasks assessed/performed       Past Medical History:  Diagnosis Date  . Breast cancer ([place])    left breast/lumpectomy/chemo/rad 2003  . COPD (chronic obstructive pulmonary disease) ([place])   . DDD (degenerative disc disease), lumbar 08/31/2017  . Diverticulitis   . GERD (gastroesophageal reflux disease)   . Hemoptysis   . Hyperlipidemia   . Iron deficiency   . Personal history of radiation therapy 2003  . Pulmonary embolism ([place])   . Tachycardia     Past Surgical History:  Procedure Laterality Date  . BACK SURGERY    . BREAST LUMPECTOMY    . COLONOSCOPY  01/28/2012   Procedure: COLONOSCOPY;  Surgeon: Rogene Houston, MD;  [facility];  Service: Endoscopy;  Laterality: N/A;  1200  . COLONOSCOPY  01-2012  . ESOPHAGOGASTRODUODENOSCOPY N/A 07/01/2013   Procedure: ESOPHAGOGASTRODUODENOSCOPY (EGD);  Surgeon: Rogene Houston, MD;  [facility];  Service: Endoscopy;  Laterality: N/A;  1:10  . LAPAROSCOPIC TUBAL LIGATION  1970  . TONSILLECTOMY     Patient was age 72    There were no vitals filed for this  visit.  Subjective Assessment - 01/11/18 1549    Subjective  Patient arrives at [facility] for aquatic therapy appointment. She reports 2/10 pain this date and states she has been recovering today from hosting friends over the weekend.     Limitations  Lifting;Standing;Walking;House hold activities    How long can you sit comfortably?  "depends on the chair", supportive soft chair can sit as long as she wants to    How long can you stand comfortably?  about 15 minutes    How long can you walk comfortably?  10-15 minutes    Diagnostic tests  x-ray of hips and lumbar spine, no acute findings    Patient Stated Goals  to have better endurance and improved balance    Currently in Pain?  Yes    Pain Score  2     Pain Location  Back    Pain Orientation  Lower    Pain Descriptors / Indicators  Aching;Nagging    Pain Type  Chronic pain    Pain Onset  More than a month ago    Aggravating Factors   standing for prolonged periods    Pain Relieving Factors  rest, water, heat    Effect of Pain on Daily Activities  moderate  Adult Aquatic Therapy - 01/11/18 1605      Aquatic Therapy Subjective   Subjective   Patient arrives at [facility] for aquatic therapy appointment. She reports 2/10 pain this date and states she has been recovering today from hosting friends over the weekend.        Treatment   Gait  Side-step along lane line 3xRT, with 8lb weights at ankles.    Specific Exercises  Hip/Low Back    Hip/Low Back  Attempted pelvic clock seated on noodle with floatation belt on, patient unable to maintain seated position to perform lateral tilts and ant/post tilts. Bicycle kicks with bil UE support on wall, 2x 1 minute. Pilates single leg stretch with noodle in front and floatation belt: 2x 10 reps Bil LE. Pilates double leg stretch with noodle in front and floatation belt: 1x 10 reps. Leg pull back with 2 HHA at wall: 1x 15 reps bil LE. Scissors: 2x 15 reps with noodle and floatation belt. Leg press with  noodle: 2x 12 reps bil LE. Side kick with 1 HHA at wall, forward/abduction to extension to adduction, 1x 15 reps bil LE. Seated spine twist on step, 5 reps bil direction, 5 second holds.          PT Education - 01/11/18 1605    Education Details  Educated on exercises throughout session and on new pelvic clock exercises, patient not ready to progress to this exercise.    Person(s) Educated  Patient    Methods  Explanation    Comprehension  Verbalized understanding       PT Short Term Goals - 12/23/17 1641      PT SHORT TERM GOAL #1   Title  Patient will be independent wtih HEP, updated PRN, to improve bil LE strength, balance, and functional mobility to reduce back pain and improve endurance.    Time  2    Period  Weeks    Status  New    Target Date  01/06/18      PT SHORT TERM GOAL #2   Title  Patient will improve MMT by 1/2 grade for limited groups in Bil LE to increase functional strenght for greater activity tolerance and walking endurance.    Time  3    Period  Weeks    Status  New    Target Date  01/13/18      PT SHORT TERM GOAL #3   Title  Patient will peform SLS on bil LE for 20 seconds to demonstrate improved balance and safety for gait and stair mobility.    Time  3    Period  Weeks    Status  New        PT Long Term Goals - 12/23/17 1645      PT LONG TERM GOAL #1   Title  Patient will improve MMT by 1 grade for limited groups in Bil LE to increase functional strenght for greater activity tolerance and walking endurance.    Time  6    Period  Weeks    Status  New    Target Date  02/03/18      PT LONG TERM GOAL #2   Title  Patient will improve FOTO score by 10% to indicate decreased self reported limitations related to LBP and improved function with mobility.     Time  6    Period  Weeks    Status  New      PT LONG TERM GOAL #3  Title  Patient will ambulate at 1.0 m/s during 2MWT with LRAD and no signs of ataxia to demonstrate reduced fall risk and  improved community mobility access.    Time  6    Period  Weeks    Status  New      PT LONG TERM GOAL #4   Title  Patient will improve TUG score by 6 seconds to demosntrate improve balance with gait and improve 5x sit to stand time by 6 seconds to demonstrate improve LE strength and improve function with transitional movements.    Time  6    Period  Weeks    Status  New       Plan - 01/11/18 1553    Clinical Impression Statement  Continued aquatic therapy this session. Attempted to perform pelvic tilts seated on noodle and patient had too much difficulty maintaining upright sitting to be able to maintain proper posture for exercises. Wim floatation belt was used for supine pool exercises with TrA activation and this improved patient's alignment and posture during exercises. She progressed side step this session with weights for increased resistance and denied pain or difficulty. Double leg stretch was introduced this session and patient required verbal cues to achieve proper form. At EOS patient reported pain resolved and now 0/10. She will continue to benefit from skilled PT interventions to address current impairments and improve functional mobility.    Rehab Potential  Good    PT Frequency  2x / week    PT Duration  6 weeks    PT Treatment/Interventions  ADLs/Self Care Home Management;Aquatic Therapy;Cryotherapy;Electrical Stimulation;Moist Heat;Gait training;DME Instruction;Stair training;Functional mobility training;Therapeutic activities;Therapeutic exercise;Balance training;Neuromuscular re-education;Patient/family education;Manual techniques;Passive range of motion;Taping    PT Next Visit Plan  Review appointments schedule and remaining visits with patient to discuss plan to continue through end of August into September. Continue bil LE strengthening and balance training in SLS and tandem on solid surface. Initiate gait training with SPC to improve safety with community ambulation.  Continue with Pilates based program for aquatic therapy. Add balance exercises into aquatic session.    PT Home Exercise Plan  12/29/17: TrA activation, marching and bridges    Consulted and Agree with Plan of Care  Patient       Patient will benefit from skilled therapeutic intervention in order to improve the following deficits and impairments:  Abnormal gait, Decreased endurance, Decreased activity tolerance, Decreased strength, Pain, Decreased balance, Decreased mobility, Difficulty walking, Decreased range of motion, Postural dysfunction, Impaired flexibility  Visit Diagnosis: Midline low back pain without sciatica, unspecified chronicity  Muscle weakness (generalized)  Other abnormalities of gait and mobility     Problem List Patient Active Problem List   Diagnosis Date Noted  . Dizziness 01/03/2018  . DDD (degenerative disc disease), lumbar 08/31/2017  . Generalized OA 08/31/2017  . Venous hypertension of both lower extremities 07/03/2017  . Osteopenia 05/13/2016  . History of left breast cancer 05/13/2016  . Glaucoma 05/13/2016  . Hyperlipidemia 05/13/2016  . Urinary incontinence 05/13/2016  . Tobacco use disorder 05/13/2016  . GERD (gastroesophageal reflux disease) 06/27/2013  . H/O: upper GI bleed 06/27/2013  . Epigastric pain 06/27/2013  . Tachycardia-bradycardia [facility]) 11/21/2010    Kipp Brood, PT, DPT Physical Therapist with [facility]  01/11/2018 4:11 PM    [place], [place], [address] Phone: [phone]   Fax:  [phone]  Name: [name] MRN: [id number] Date  of Birth: 12-15-1945

## 2018-01-13 ENCOUNTER — Ambulatory Visit (HOSPITAL_COMMUNITY): Payer: Medicare HMO | Admitting: Physical Therapy

## 2018-01-13 DIAGNOSIS — R2689 Other abnormalities of gait and mobility: Secondary | ICD-10-CM | POA: Diagnosis not present

## 2018-01-13 DIAGNOSIS — M545 Low back pain, unspecified: Secondary | ICD-10-CM

## 2018-01-13 DIAGNOSIS — M6281 Muscle weakness (generalized): Secondary | ICD-10-CM | POA: Diagnosis not present
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
## 2018-01-20 ENCOUNTER — Ambulatory Visit (HOSPITAL_COMMUNITY): Payer: Medicare HMO

## 2018-01-20 ENCOUNTER — Other Ambulatory Visit: Payer: Self-pay

## 2018-01-20 ENCOUNTER — Encounter (HOSPITAL_COMMUNITY): Payer: Self-pay

## 2018-01-20 ENCOUNTER — Telehealth (HOSPITAL_COMMUNITY): Payer: Self-pay

## 2018-01-20 DIAGNOSIS — M6281 Muscle weakness (generalized): Secondary | ICD-10-CM | POA: Diagnosis not present

## 2018-01-20 DIAGNOSIS — M545 Low back pain, unspecified: Secondary | ICD-10-CM

## 2018-01-20 DIAGNOSIS — R2689 Other abnormalities of gait and mobility: Secondary | ICD-10-CM | POA: Diagnosis not present

## 2018-01-20 NOTE — Telephone Encounter (Signed)
I called Ms. Garciamartinez and left a message asking for her to come in at an earlier time as I have openings and will need the afternoon to accommodate a wound care patient. I asked that Ms. Hoglund call us back if she can attend and earlier appointment and that if she is unable to attend an earlier appointment we will keep her scheduled for 3:15 PM today. I provided our front office number to call us back.  Kipp Brood, PT, DPT Physical Therapist with [facility]  01/20/2018 1:36 PM
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
## 2018-01-21 DIAGNOSIS — K130 Diseases of lips: Secondary | ICD-10-CM | POA: Diagnosis not present

## 2018-01-21 DIAGNOSIS — L299 Pruritus, unspecified: Secondary | ICD-10-CM | POA: Diagnosis not present

## 2018-01-21 DIAGNOSIS — D229 Melanocytic nevi, unspecified: Secondary | ICD-10-CM | POA: Diagnosis not present

## 2018-01-25 ENCOUNTER — Telehealth: Payer: Self-pay | Admitting: *Deleted

## 2018-01-25 ENCOUNTER — Ambulatory Visit (HOSPITAL_COMMUNITY): Payer: Medicare HMO

## 2018-01-25 ENCOUNTER — Encounter (HOSPITAL_COMMUNITY): Payer: Self-pay

## 2018-01-25 ENCOUNTER — Other Ambulatory Visit: Payer: Self-pay

## 2018-01-25 DIAGNOSIS — M6281 Muscle weakness (generalized): Secondary | ICD-10-CM | POA: Diagnosis not present

## 2018-01-25 DIAGNOSIS — M545 Low back pain, unspecified: Secondary | ICD-10-CM

## 2018-01-25 DIAGNOSIS — R2689 Other abnormalities of gait and mobility: Secondary | ICD-10-CM

## 2018-01-25 NOTE — Telephone Encounter (Signed)
Tell her for her Insurance, she has to come in so we can document, no improvement with PT and re-examine her before MRI can be ordered. She also needs plain xray of lumbar spine  So she can either schedule with [place] or she can be referred to orthopedics, she did seen Dr. Aline Brochure 2 years ago for her spine,   Dx- Multilevel DDD lumbar spine

## 2018-01-25 NOTE — Telephone Encounter (Signed)
Call placed to patient and patient made aware.   Appointment scheduled.   Of note, patient states that she did have x-ray at [facility]. Advised last X- ray was in 2017. States that she will discuss with MD.

## 2018-01-25 NOTE — Therapy (Signed)
[place], [place], [address] Phone: [phone]   Fax:  [phone]  Physical Therapy Treatment  Patient Details  Name: [name] MRN: [id number] Date of Birth: Mar 18, 1946 Referring Provider: Delsa Grana, PA-C   Encounter Date: 01/25/2018  PT End of Session - 01/25/18 1608    Visit Number  9    Number of Visits  13    Date for PT Re-Evaluation  02/03/18    Authorization Type  [employer] HMO (no auth required, no visit limit)    Authorization Time Period  12/23/17 - 02/05/18    Authorization - Visit Number  9    Authorization - Number of Visits  10    PT Start Time  7035    PT Stop Time  1510    PT Time Calculation (min)  47 min    Activity Tolerance  Patient tolerated treatment well    Behavior During Therapy  [facility] for tasks assessed/performed       Past Medical History:  Diagnosis Date  . Breast cancer ([place])    left breast/lumpectomy/chemo/rad 2003  . COPD (chronic obstructive pulmonary disease) ([place])   . DDD (degenerative disc disease), lumbar 08/31/2017  . Diverticulitis   . GERD (gastroesophageal reflux disease)   . Hemoptysis   . Hyperlipidemia   . Iron deficiency   . Personal history of radiation therapy 2003  . Pulmonary embolism ([place])   . Tachycardia     Past Surgical History:  Procedure Laterality Date  . BACK SURGERY    . BREAST LUMPECTOMY    . COLONOSCOPY  01/28/2012   Procedure: COLONOSCOPY;  Surgeon: Rogene Houston, MD;  [facility];  Service: Endoscopy;  Laterality: N/A;  1200  . COLONOSCOPY  01-2012  . ESOPHAGOGASTRODUODENOSCOPY N/A 07/01/2013   Procedure: ESOPHAGOGASTRODUODENOSCOPY (EGD);  Surgeon: Rogene Houston, MD;  [facility];  Service: Endoscopy;  Laterality: N/A;  1:10  . LAPAROSCOPIC TUBAL LIGATION  1970  . TONSILLECTOMY     Patient was age 72    There were no vitals filed for this visit.  Subjective Assessment - 01/25/18 1604    Subjective  Patient reports she is continuing to experience worsening pain in her Lt LE and it has started running into her Lt LE below her knee. She reports she has made an appointment with her MD for this Friday to discuss worsening symptoms and to have an MRI of her lower back.    Limitations  Lifting;Standing;Walking;House hold activities    How long can you sit comfortably?  "depends on the chair", supportive soft chair can sit as long as she wants to    How long can you stand comfortably?  about 15 minutes    How long can you walk comfortably?  10-15 minutes    Diagnostic tests  x-ray of hips and lumbar spine, no acute findings    Patient Stated Goals  to have better endurance and improved balance    Currently in Pain?  Yes    Pain Score  6     Pain Location  Back    Pain Orientation  Lower    Pain Descriptors / Indicators  Aching    Pain Type  Chronic pain    Pain Radiating Towards  radiating down her Lt leg below her knee    Pain Onset  More than a month ago    Pain Frequency  Constant    Aggravating  Factors   worse on hard surfaces    Pain Relieving Factors  water, heat    Effect of Pain on Daily Activities  severe       Adult Aquatic Therapy - 01/25/18 1611      Aquatic Therapy Subjective   Subjective  Patient reports she is continuing to experience worsening pain in her Lt LE and it has started running into her Lt LE below her knee. She reports she has made an appointment with her MD for this Friday to discuss worsening symptoms and to have an MRI of her lower back.      Treatment   Gait  Forward walking, 3 laps. Side step along lane line, 2 RT. (full length of pool area this date)    Specific Exercises  Hip/Low Back    Hip/Low Back  Leg pull back with 2 HHA at wall: 1x 15 reps bil LE. Leg press with noodle: 1x 15 reps bil LE. Side-kick with 1 HHA at wall, forward/abduction to extension to adduction, 1x 15 reps bil LE. "Saw" lumbar rotation with oblique activation, 1x 10 reps  bil. LAQ/sciatic nerve glide in standing with 1 HHA at wall, 1x 10 reps bil LE, (cervical ext with knee ext, cervical flexion with knee flexion). Squat with heel raise combination, 1x 15 reps, 2 HHA.        PT Education - 01/25/18 1607    Education Details  Discussed plan to re-assess objective measures at next session on Wednesday before patient returns to MD to assess if there are any changes.     Person(s) Educated  Patient    Methods  Explanation    Comprehension  Verbalized understanding       PT Short Term Goals - 12/23/17 1641      PT SHORT TERM GOAL #1   Title  Patient will be independent wtih HEP, updated PRN, to improve bil LE strength, balance, and functional mobility to reduce back pain and improve endurance.    Time  2    Period  Weeks    Status  New    Target Date  01/06/18      PT SHORT TERM GOAL #2   Title  Patient will improve MMT by 1/2 grade for limited groups in Bil LE to increase functional strenght for greater activity tolerance and walking endurance.    Time  3    Period  Weeks    Status  New    Target Date  01/13/18      PT SHORT TERM GOAL #3   Title  Patient will peform SLS on bil LE for 20 seconds to demonstrate improved balance and safety for gait and stair mobility.    Time  3    Period  Weeks    Status  New        PT Long Term Goals - 12/23/17 1645      PT LONG TERM GOAL #1   Title  Patient will improve MMT by 1 grade for limited groups in Bil LE to increase functional strenght for greater activity tolerance and walking endurance.    Time  6    Period  Weeks    Status  New    Target Date  02/03/18      PT LONG TERM GOAL #2   Title  Patient will improve FOTO score by 10% to indicate decreased self reported limitations related to LBP and improved function with mobility.     Time  6    Period  Weeks    Status  New      PT LONG TERM GOAL #3   Title  Patient will ambulate at 1.0 m/s during 2MWT with LRAD and no signs of ataxia to  demonstrate reduced fall risk and improved community mobility access.    Time  6    Period  Weeks    Status  New      PT LONG TERM GOAL #4   Title  Patient will improve TUG score by 6 seconds to demosntrate improve balance with gait and improve 5x sit to stand time by 6 seconds to demonstrate improve LE strength and improve function with transitional movements.    Time  6    Period  Weeks    Status  New        Plan - 01/25/18 1608    Clinical Impression Statement  Continued aquatic therapy this session. Patient arrived reporting increased pain over the last 7-10 days and worsening of symptoms distally. Extensive time taken at start of session to discuss potential sources of pain and explain centralization of pain with radicular symptoms and discuss the differences between MRI and radiograph imaging. Pool exercises focused on LE strengthening today and patient performed sciatic nerve glide to LT LE. She reported positive response to this and decreased pain in her lower leg and Lt hip. At EOS patient reported decreased pain now 2/10. She will continue to benefit from skilled PT interventions to address current impairments and improve functional mobility.    Rehab Potential  Good    PT Frequency  2x / week    PT Duration  6 weeks    PT Treatment/Interventions  ADLs/Self Care Home Management;Aquatic Therapy;Cryotherapy;Electrical Stimulation;Moist Heat;Gait training;DME Instruction;Stair training;Functional mobility training;Therapeutic activities;Therapeutic exercise;Balance training;Neuromuscular re-education;Patient/family education;Manual techniques;Passive range of motion;Taping    PT Next Visit Plan  Re-assess measures this session (patient has MD appointment on Friday) Continue bil LE strengthening and balance training in SLS and tandem on solid surface. Continue with Pilates based program for aquatic therapy. Add balance exercises into aquatic session. Follow up on sciatic nerve glide  response.    PT Home Exercise Plan  12/29/17: TrA activation, marching and bridges    Consulted and Agree with Plan of Care  Patient       Patient will benefit from skilled therapeutic intervention in order to improve the following deficits and impairments:  Abnormal gait, Decreased endurance, Decreased activity tolerance, Decreased strength, Pain, Decreased balance, Decreased mobility, Difficulty walking, Decreased range of motion, Postural dysfunction, Impaired flexibility  Visit Diagnosis: Midline low back pain without sciatica, unspecified chronicity  Muscle weakness (generalized)  Other abnormalities of gait and mobility     Problem List Patient Active Problem List   Diagnosis Date Noted  . Dizziness 01/03/2018  . DDD (degenerative disc disease), lumbar 08/31/2017  . Generalized OA 08/31/2017  . Venous hypertension of both lower extremities 07/03/2017  . Osteopenia 05/13/2016  . History of left breast cancer 05/13/2016  . Glaucoma 05/13/2016  . Hyperlipidemia 05/13/2016  . Urinary incontinence 05/13/2016  . Tobacco use disorder 05/13/2016  . GERD (gastroesophageal reflux disease) 06/27/2013  . H/O: upper GI bleed 06/27/2013  . Epigastric pain 06/27/2013  . 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, [place], [address] Phone: [phone]   Fax:  [phone]  Name: [name] MRN: [id number] Date of Birth: June 04, 1945

## 2018-01-25 NOTE — Telephone Encounter (Signed)
Received call from patient.   Reports that she has tried traditional physical therapy and water therapy for her back pain with no relief.   Would like to have MRI of her back at this time.   MD please advise.

## 2018-01-27 ENCOUNTER — Other Ambulatory Visit: Payer: Self-pay

## 2018-01-27 ENCOUNTER — Ambulatory Visit (HOSPITAL_COMMUNITY): Payer: Medicare HMO

## 2018-01-27 ENCOUNTER — Encounter (HOSPITAL_COMMUNITY): Payer: Self-pay

## 2018-01-27 DIAGNOSIS — M6281 Muscle weakness (generalized): Secondary | ICD-10-CM

## 2018-01-27 DIAGNOSIS — M545 Low back pain, unspecified: Secondary | ICD-10-CM

## 2018-01-27 DIAGNOSIS — R2689 Other abnormalities of gait and mobility: Secondary | ICD-10-CM | POA: Diagnosis not present

## 2018-01-27 NOTE — Therapy (Signed)
[place], [place], [address] Phone: [phone]   Fax:  [phone]  Physical Therapy Treatment/Discharge Summary  Patient Details  Name: [name] MRN: [id number] Date of Birth: April 18, 1946 Referring Provider: Delsa Grana, PA-C   Encounter Date: 01/27/2018  PHYSICAL THERAPY DISCHARGE SUMMARY  Visits from Start of Care: 10  Current functional level related to goals / functional outcomes: Re-assessment performed this session and patient has met all short term and long term goals with exception of SLS balance. She has improved gait velocity significantly and is ambulating right below 1.0 m/s indicating decreased fall risk. She has made significant improvement in bil LE strength and in 5x sit to stand test however still requires use of bil UE. She has reported feeling ready to transition into an independent strengthening routine at the local fitness center and is agreeable to discharging today. She was provided updated HEP with sciatic nerve flossing as she reported positive response to this exercise. She will be discharged following today's visit.   Remaining deficits: See below details    Education / Equipment: Discussed readiness for discharge and major improvements since starting therpay. Educated on importance of continued exercise wtih HEP and benefits of joining local gym facility to participate in aquatic exercises. Edcuated on HEP for sciatic nerve flossing as patient had decrease in symptoms after performing this on Monday.  Plan: Patient agrees to discharge.  Patient goals were met. Patient is being discharged due to meeting the stated rehab goals.  ?????      PT End of Session - 01/27/18 1526    Visit Number  10    Number of Visits  13    Date for PT Re-Evaluation  02/03/18    Authorization Type  [employer] H[employer] (no auth required, no visit limit)    Authorization Time Period  12/23/17 - 02/05/18     Authorization - Visit Number  1    Authorization - Number of Visits  10    PT Start Time  1519    PT Stop Time  1600    PT Time Calculation (min)  41 min    Activity Tolerance  Patient tolerated treatment well    Behavior During Therapy  WFL for tasks assessed/performed       Past Medical History:  Diagnosis Date  . Breast cancer ([place])    left breast/lumpectomy/chemo/rad 2003  . COPD (chronic obstructive pulmonary disease) ([place])   . DDD (degenerative disc disease), lumbar 08/31/2017  . Diverticulitis   . GERD (gastroesophageal reflux disease)   . Hemoptysis   . Hyperlipidemia   . Iron deficiency   . Personal history of radiation therapy 2003  . Pulmonary embolism ([place])   . Tachycardia     Past Surgical History:  Procedure Laterality Date  . BACK SURGERY    . BREAST LUMPECTOMY    . COLONOSCOPY  01/28/2012   Procedure: COLONOSCOPY;  Surgeon: Rogene Houston, MD;  [facility];  Service: Endoscopy;  Laterality: N/A;  1200  . COLONOSCOPY  01-2012  . ESOPHAGOGASTRODUODENOSCOPY N/A 07/01/2013   Procedure: ESOPHAGOGASTRODUODENOSCOPY (EGD);  Surgeon: Rogene Houston, MD;  [facility];  Service: Endoscopy;  Laterality: N/A;  1:10  . LAPAROSCOPIC TUBAL LIGATION  1970  . TONSILLECTOMY     Patient was age 72    There were no vitals filed for this visit.  Subjective Assessment - 01/27/18 1616    Subjective  Patient reports  she is feeling much better since Monday and her pain is around a 1/10. She states she does not feel that she needs an MRI any more and thinks she will cancel it.     Limitations  Lifting;Standing;Walking;House hold activities    How long can you sit comfortably?  "depends on the chair", supportive soft chair can sit as long as she wants to    How long can you stand comfortably?  about 15 minutes    How long can you walk comfortably?  10-15 minutes    Diagnostic tests  x-ray of hips and lumbar spine, no acute findings    Patient Stated Goals   to have better endurance and improved balance    Currently in Pain?  Yes    Pain Score  1     Pain Location  Back    Pain Orientation  Lower    Pain Descriptors / Indicators  Aching    Pain Type  Chronic pain    Pain Onset  More than a month ago    Pain Frequency  Constant    Aggravating Factors   exercises    Pain Relieving Factors  water       [facility] PT Assessment - 01/27/18 0001      Assessment   Medical Diagnosis  Lumbosacral strain    Referring Provider  Delsa Grana, PA-C    Onset Date/Surgical Date  10/24/17    Hand Dominance  Right    Prior Therapy  prior PT for hip/LE      Precautions   Precautions  None      Restrictions   Weight Bearing Restrictions  No      Prior Function   Level of Independence  Independent    Vocation  Retired      Associate Professor   Overall Cognitive Status  Within Functional Limits for tasks assessed      Observation/Other Assessments   Focus on Therapeutic Outcomes (FOTO)   38% limited   was 60% limited     Posture/Postural Control   Posture/Postural Control  Postural limitations    Postural Limitations  Forward head;Rounded Shoulders      Strength   Right Hip Flexion  4+/5   was 4-   Right Hip Extension  3+/5   was 3+   Right Hip ABduction  4/5   was 3+   Left Hip Flexion  4/5   was 4-   Left Hip Extension  4-/5   was 3+   Left Hip ABduction  4/5   was 3+   Right Knee Flexion  4+/5   was 4-   Right Knee Extension  5/5   was 4   Left Knee Flexion  4+/5   was 4-   Left Knee Extension  5/5   was 4   Right Ankle Dorsiflexion  5/5   was 4+   Left Ankle Dorsiflexion  5/5   was 4+     Transfers   Five time sit to stand comments   25.4 with UE use to push up   was 33.4   Comments  patient unable to perform 1 transfer without UE assistance      Ambulation/Gait   Ambulation/Gait  Yes    Ambulation/Gait Assistance  7: Independent    Ambulation Distance (Feet)  374 Feet    Gait velocity  0.94 m/s   was 0.57 m/s      Standardized Balance Assessment   Standardized Balance Assessment  Timed Up and Go Test      Timed Up and Go Test   TUG  Normal TUG    Normal TUG (seconds)  12.4       [facility] Adult PT Treatment/Exercise - 01/27/18 0001      Ambulation/Gait   Assistive device  None    Gait Pattern  Within Functional Limits    Ambulation Surface  Level;Indoor      Exercises   Exercises  Lumbar      Lumbar Exercises: Seated   Other Seated Lumbar Exercises  sciatic nerve flossing for Lt LE, 10 reps        PT Education - 01/27/18 1607    Education Details  Discussed readiness for discharge and major improvements since starting therpay. Educated on importance of continued exercise wtih HEP and benefits of joining local gym facility to participate in aquatic exercises. Edcuated on HEP for sciatic nerve flossing as patient had decrease in symptoms after performing this on Monday.    Person(s) Educated  Patient    Methods  Explanation;Handout    Comprehension  Verbalized understanding       PT Short Term Goals - 01/27/18 1601      PT SHORT TERM GOAL #1   Title  Patient will be independent wtih HEP, updated PRN, to improve bil LE strength, balance, and functional mobility to reduce back pain and improve endurance.    Time  2    Period  Weeks    Status  Achieved      PT SHORT TERM GOAL #2   Title  Patient will improve MMT by 1/2 grade for limited groups in Bil LE to increase functional strenght for greater activity tolerance and walking endurance.    Time  3    Period  Weeks    Status  Achieved      PT SHORT TERM GOAL #3   Title  Patient will peform SLS on bil LE for 20 seconds to demonstrate improved balance and safety for gait and stair mobility.    Time  3    Period  Weeks    Status  Not Met        PT Long Term Goals - 01/27/18 1601      PT LONG TERM GOAL #1   Title  Patient will improve MMT by 1 grade for limited groups in Bil LE to increase functional strenght for greater activity  tolerance and walking endurance.    Baseline  with exception of Rt hip extension    Time  6    Period  Weeks    Status  Achieved      PT LONG TERM GOAL #2   Title  Patient will improve FOTO score by 10% to indicate decreased self reported limitations related to LBP and improved function with mobility.     Time  6    Period  Weeks    Status  Achieved      PT LONG TERM GOAL #3   Title  Patient will ambulate at 1.0 m/s during 2MWT with LRAD and no signs of ataxia to demonstrate reduced fall risk and improved community mobility access.    Time  6    Period  Weeks    Status  Achieved      PT LONG TERM GOAL #4   Title  Patient will improve TUG score by 6 seconds to demosntrate improve balance with gait and improve 5x sit to stand time by 6 seconds  to demonstrate improve LE strength and improve function with transitional movements.    Time  6    Period  Weeks    Status  Achieved        Plan - 01/27/18 1609    Clinical Impression Statement  Re-assessment performed this session and patient has met all short term and long term goals with exception of SLS balance. She has improved gait velocity significantly and is ambulating right below 1.0 m/s indicating decreased fall risk. She has made significant improvement in bil LE strength and in 5x sit to stand test however still requires use of bil UE. She has reported feeling ready to transition into an independent strengthening routine at the local fitness center and is agreeable to discharging today. She was provided updated HEP with sciatic nerve flossing as she reported positive response to this exercise. She will be discharged following today's visit.    Rehab Potential  Good    PT Frequency  2x / week    PT Duration  6 weeks    PT Treatment/Interventions  ADLs/Self Care Home Management;Aquatic Therapy;Cryotherapy;Electrical Stimulation;Moist Heat;Gait training;DME Instruction;Stair training;Functional mobility training;Therapeutic  activities;Therapeutic exercise;Balance training;Neuromuscular re-education;Patient/family education;Manual techniques;Passive range of motion;Taping    PT Next Visit Plan  Discharge today    PT Home Exercise Plan  12/29/17: TrA activation, marching and bridges; Sciatic nerve glide    Consulted and Agree with Plan of Care  Patient       Patient will benefit from skilled therapeutic intervention in order to improve the following deficits and impairments:  Abnormal gait, Decreased endurance, Decreased activity tolerance, Decreased strength, Pain, Decreased balance, Decreased mobility, Difficulty walking, Decreased range of motion, Postural dysfunction, Impaired flexibility  Visit Diagnosis: Midline low back pain without sciatica, unspecified chronicity  Muscle weakness (generalized)  Other abnormalities of gait and mobility     Problem List Patient Active Problem List   Diagnosis Date Noted  . Dizziness 01/03/2018  . DDD (degenerative disc disease), lumbar 08/31/2017  . Generalized OA 08/31/2017  . Venous hypertension of both lower extremities 07/03/2017  . Osteopenia 05/13/2016  . History of left breast cancer 05/13/2016  . Glaucoma 05/13/2016  . Hyperlipidemia 05/13/2016  . Urinary incontinence 05/13/2016  . Tobacco use disorder 05/13/2016  . GERD (gastroesophageal reflux disease) 06/27/2013  . H/O: upper GI bleed 06/27/2013  . Epigastric pain 06/27/2013  . 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

## 2018-01-29 ENCOUNTER — Ambulatory Visit: Payer: Self-pay | Admitting: Family Medicine

## 2018-02-03 ENCOUNTER — Ambulatory Visit (HOSPITAL_COMMUNITY): Payer: Medicare HMO

## 2018-02-10 ENCOUNTER — Encounter (HOSPITAL_COMMUNITY): Payer: Medicare HMO

## 2018-04-02 DIAGNOSIS — H26491 Other secondary cataract, right eye: Secondary | ICD-10-CM | POA: Diagnosis not present

## 2018-04-02 DIAGNOSIS — H353211 Exudative age-related macular degeneration, right eye, with active choroidal neovascularization: Secondary | ICD-10-CM | POA: Diagnosis not present

## 2018-04-02 DIAGNOSIS — H35372 Puckering of macula, left eye: Secondary | ICD-10-CM | POA: Diagnosis not present

## 2018-04-02 DIAGNOSIS — H353222 Exudative age-related macular degeneration, left eye, with inactive choroidal neovascularization: Secondary | ICD-10-CM | POA: Diagnosis not present

## 2018-04-20 DIAGNOSIS — H353211 Exudative age-related macular degeneration, right eye, with active choroidal neovascularization: Secondary | ICD-10-CM | POA: Diagnosis not present

## 2018-05-04 ENCOUNTER — Other Ambulatory Visit: Payer: Self-pay

## 2018-05-04 ENCOUNTER — Encounter: Payer: Self-pay | Admitting: Family Medicine

## 2018-05-04 ENCOUNTER — Ambulatory Visit (INDEPENDENT_AMBULATORY_CARE_PROVIDER_SITE_OTHER): Payer: Medicare HMO | Admitting: Family Medicine

## 2018-05-04 VITALS — BP 126/62 | HR 90 | Temp 98.5°F | Resp 16 | Ht 68.5 in | Wt 163.0 lb

## 2018-05-04 DIAGNOSIS — I495 Sick sinus syndrome: Secondary | ICD-10-CM

## 2018-05-04 DIAGNOSIS — E782 Mixed hyperlipidemia: Secondary | ICD-10-CM | POA: Diagnosis not present

## 2018-05-04 DIAGNOSIS — R42 Dizziness and giddiness: Secondary | ICD-10-CM | POA: Diagnosis not present

## 2018-05-04 DIAGNOSIS — Z23 Encounter for immunization: Secondary | ICD-10-CM

## 2018-05-04 DIAGNOSIS — W19XXXD Unspecified fall, subsequent encounter: Secondary | ICD-10-CM

## 2018-05-04 DIAGNOSIS — R29818 Other symptoms and signs involving the nervous system: Secondary | ICD-10-CM

## 2018-05-04 NOTE — Patient Instructions (Addendum)
MRI of brain to be done  Carotid artery ultrasound to be done  Flu shot done  We will call with lab results  F/U pending results

## 2018-05-04 NOTE — Progress Notes (Signed)
Subjective:    Patient ID: [name], female    DOB: 01-22-1946, 72 y.o.   MRN: [id number]  Patient presents for Vertigo (S/P fall- dizziness has worsened- Meclizine does not help)    Pt here with continued dizziness and balance. She has history of vertigo. She fell a 2 times in the past month. She was walking and lost her balance and fell forward out of nowhere.  Even when she is standing if she moves her head a certain way she will feel off balance She had PT for her back and balance- most exercises were in the water, but she has not felt much difference.   She does have vision problems, had shots in her left eye 4 months ago, last seen by eye doctor 4 weeks, had shot in right eye for macular degeneration    HTN- she is on toprol 12.5mg       Review Of Systems:  GEN- denies fatigue, fever, weight loss,weakness, recent illness HEENT- denies eye drainage, change in vision, nasal discharge, CVS- denies chest pain, palpitations RESP- denies SOB, cough, wheeze ABD- denies N/V, change in stools, abd pain GU- denies dysuria, hematuria, dribbling, incontinence MSK- denies joint pain, muscle aches, injury Neuro- denies headache, +dizziness, syncope, seizure activity       Objective:    BP 126/62   Pulse 90   Temp 98.5 F (36.9 C) (Oral)   Resp 16   Ht 5' 8.5" (1.74 m)   Wt 163 lb (73.9 kg)   SpO2 97%   BMI 24.42 kg/m  GEN- NAD, alert and oriented x3 HEENT- PERRL, EOMI, non injected sclera, pink conjunctiva, MMM, oropharynx clear Neck- Supple, no bruit CVS- RRR, no murmur RESP-CTAB ABD-NABS,soft,NT,ND Neuro-CNII-XII in tact, neg rhomberg, no focal deficits EXT- No edema Pulses- Radial, 2+        Assessment & Plan:      Problem List Items Addressed This Visit      Unprioritized   Dizziness    Recurrent episodes of dizziness andnow with falls Unclear if related to age, or brain lesion, cerbrovascular compromise Obtain MRI brain- to look at structural  compenent, and sign of stroke in past Carotid US , risk factors hyperlipidemia, age She has seen ENT, did not feel this was classic vertigo      Relevant Orders   MR Brain Wo Contrast   US Carotid Duplex Bilateral   Hyperlipidemia - Primary   Relevant Medications   aspirin EC 81 MG tablet   Other Relevant Orders   CBC with Differential/Platelet (Completed)   Comprehensive metabolic panel (Completed)   Lipid panel (Completed)   US Carotid Duplex Bilateral   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HCC)    HR controlled low dose BB, do not want to discontinue at this time      Relevant Medications   aspirin EC 81 MG tablet   Other Relevant Orders   CBC with Differential/Platelet (Completed)   Comprehensive metabolic panel (Completed)    Other Visit Diagnoses    Fall, subsequent encounter       Relevant Orders   MR Brain Wo Contrast   Need for prophylactic vaccination and inoculation against influenza       Relevant Orders   Flu vaccine HIGH DOSE PF (Fluzone High dose) (Completed)   Other symptoms and signs involving the nervous system       Relevant Orders   MR Brain Wo Contrast      Note: This dictation was prepared with Dragon dictation along  with smaller phrase technology. Any transcriptional errors that result from this process are unintentional.

## 2018-05-05 ENCOUNTER — Encounter: Payer: Self-pay | Admitting: Family Medicine

## 2018-05-05 LAB — COMPREHENSIVE METABOLIC PANEL
AG RATIO: 1.6 (calc) (ref 1.0–2.5)
ALT: 8 U/L (ref 6–29)
AST: 15 U/L (ref 10–35)
Albumin: 3.9 g/dL (ref 3.6–5.1)
Alkaline phosphatase (APISO): 88 U/L (ref 33–130)
BUN/Creatinine Ratio: 25 (calc) — ABNORMAL HIGH (ref 6–22)
BUN: 14 mg/dL (ref 7–25)
CO2: 28 mmol/L (ref 20–32)
Calcium: 9.6 mg/dL (ref 8.6–10.4)
Chloride: 102 mmol/L (ref 98–110)
Creat: 0.56 mg/dL — ABNORMAL LOW (ref 0.60–0.93)
Globulin: 2.5 g/dL (calc) (ref 1.9–3.7)
Glucose, Bld: 94 mg/dL (ref 65–99)
Potassium: 5.3 mmol/L (ref 3.5–5.3)
Sodium: 139 mmol/L (ref 135–146)
Total Bilirubin: 0.2 mg/dL (ref 0.2–1.2)
Total Protein: 6.4 g/dL (ref 6.1–8.1)

## 2018-05-05 LAB — CBC WITH DIFFERENTIAL/PLATELET
Basophils Absolute: 80 cells/uL (ref 0–200)
Basophils Relative: 0.9 %
Eosinophils Absolute: 231 cells/uL (ref 15–500)
Eosinophils Relative: 2.6 %
HCT: 36.7 % (ref 35.0–45.0)
Hemoglobin: 12.2 g/dL (ref 11.7–15.5)
Lymphs Abs: 3177 cells/uL (ref 850–3900)
MCH: 29.5 pg (ref 27.0–33.0)
MCHC: 33.2 g/dL (ref 32.0–36.0)
MCV: 88.9 fL (ref 80.0–100.0)
MPV: 9.5 fL (ref 7.5–12.5)
Monocytes Relative: 6.4 %
Neutro Abs: 4842 cells/uL (ref 1500–7800)
Neutrophils Relative %: 54.4 %
Platelets: 447 10*3/uL — ABNORMAL HIGH (ref 140–400)
RBC: 4.13 10*6/uL (ref 3.80–5.10)
RDW: 14 % (ref 11.0–15.0)
Total Lymphocyte: 35.7 %
WBC mixed population: 570 cells/uL (ref 200–950)
WBC: 8.9 10*3/uL (ref 3.8–10.8)

## 2018-05-05 LAB — LIPID PANEL
Cholesterol: 231 mg/dL — ABNORMAL HIGH (ref <200)
HDL: 58 mg/dL (ref >50)
LDL Cholesterol (Calc): 146 mg/dL (calc) — ABNORMAL HIGH
Non-HDL Cholesterol (Calc): 173 mg/dL (calc) — ABNORMAL HIGH (ref <130)
Total CHOL/HDL Ratio: 4 (calc) (ref <5.0)
Triglycerides: 143 mg/dL (ref <150)

## 2018-05-05 NOTE — Assessment & Plan Note (Addendum)
Recurrent episodes of dizziness andnow with falls Unclear if related to age, or brain lesion, cerbrovascular compromise Obtain MRI brain- to look at structural compenent, and sign of stroke in past Carotid US , risk factors hyperlipidemia, age She has seen ENT, did not feel this was classic vertigo

## 2018-05-05 NOTE — Assessment & Plan Note (Signed)
HR controlled low dose BB, do not want to discontinue at this time

## 2018-05-07 ENCOUNTER — Other Ambulatory Visit: Payer: Self-pay | Admitting: Physician Assistant

## 2018-05-07 DIAGNOSIS — C44729 Squamous cell carcinoma of skin of left lower limb, including hip: Secondary | ICD-10-CM | POA: Diagnosis not present

## 2018-05-11 ENCOUNTER — Ambulatory Visit (HOSPITAL_COMMUNITY)
Admission: RE | Admit: 2018-05-11 | Discharge: 2018-05-11 | Disposition: A | Discharge status: Home / Self Care | Payer: Medicare HMO | Source: Ambulatory Visit | Attending: Family Medicine | Admitting: Family Medicine

## 2018-05-11 DIAGNOSIS — G912 (Idiopathic) normal pressure hydrocephalus: Secondary | ICD-10-CM | POA: Diagnosis not present

## 2018-05-11 DIAGNOSIS — R42 Dizziness and giddiness: Secondary | ICD-10-CM

## 2018-05-11 DIAGNOSIS — I6782 Cerebral ischemia: Secondary | ICD-10-CM | POA: Diagnosis not present

## 2018-05-11 DIAGNOSIS — I6523 Occlusion and stenosis of bilateral carotid arteries: Secondary | ICD-10-CM | POA: Insufficient documentation

## 2018-05-11 DIAGNOSIS — W19XXXD Unspecified fall, subsequent encounter: Secondary | ICD-10-CM | POA: Diagnosis not present

## 2018-05-11 DIAGNOSIS — G9389 Other specified disorders of brain: Secondary | ICD-10-CM | POA: Insufficient documentation

## 2018-05-11 DIAGNOSIS — E782 Mixed hyperlipidemia: Secondary | ICD-10-CM

## 2018-05-11 DIAGNOSIS — I6521 Occlusion and stenosis of right carotid artery: Secondary | ICD-10-CM | POA: Diagnosis not present

## 2018-05-11 DIAGNOSIS — R29818 Other symptoms and signs involving the nervous system: Secondary | ICD-10-CM

## 2018-05-11 DIAGNOSIS — I6389 Other cerebral infarction: Secondary | ICD-10-CM | POA: Diagnosis not present

## 2018-05-13 ENCOUNTER — Other Ambulatory Visit: Payer: Self-pay | Admitting: *Deleted

## 2018-05-13 DIAGNOSIS — R209 Unspecified disturbances of skin sensation: Secondary | ICD-10-CM

## 2018-05-13 DIAGNOSIS — I69398 Other sequelae of cerebral infarction: Secondary | ICD-10-CM

## 2018-05-13 DIAGNOSIS — G912 (Idiopathic) normal pressure hydrocephalus: Secondary | ICD-10-CM

## 2018-05-13 MED ORDER — ASPIRIN EC 325 MG PO TBEC: 325.0000 mg | DELAYED_RELEASE_TABLET | Freq: Every day | ORAL | 0 refills | Status: DC

## 2018-05-13 MED ORDER — ATORVASTATIN CALCIUM 10 MG PO TABS: 10.0000 mg | ORAL_TABLET | Freq: Every day | ORAL | 3 refills | Status: DC

## 2018-05-14 ENCOUNTER — Ambulatory Visit (HOSPITAL_COMMUNITY): Admission: RE | Admit: 2018-05-14 | Payer: Medicare HMO | Source: Ambulatory Visit

## 2018-05-17 ENCOUNTER — Encounter: Payer: Self-pay | Admitting: Diagnostic Neuroimaging

## 2018-05-17 ENCOUNTER — Ambulatory Visit (INDEPENDENT_AMBULATORY_CARE_PROVIDER_SITE_OTHER): Payer: Medicare HMO | Admitting: Diagnostic Neuroimaging

## 2018-05-17 VITALS — BP 118/65 | HR 79 | Ht 69.0 in | Wt 161.0 lb

## 2018-05-17 DIAGNOSIS — I639 Cerebral infarction, unspecified: Secondary | ICD-10-CM | POA: Diagnosis not present

## 2018-05-17 DIAGNOSIS — G912 (Idiopathic) normal pressure hydrocephalus: Secondary | ICD-10-CM | POA: Diagnosis not present

## 2018-05-17 NOTE — Progress Notes (Signed)
[facility]  PATIENT: [name] DOB: 1946-04-22  REFERRING CLINICIAN: K Winchester HISTORY FROM: patient and husband  REASON FOR VISIT: new consult    HISTORICAL  CHIEF COMPLAINT:  Chief Complaint  Patient presents with  . Normal  pressure hydrocephalus    rm 7, New Pt, husband- Simona Huh, "balance and dizzy"    HISTORY OF PRESENT ILLNESS:   72 year old female here for evaluation of dizziness and gait difficulty.  Patient had 2 major falls in June and November 2019.  She is also felt a "crazy head" sensation for the past 1 year.  She has balance difficulty, fogginess, rocking sensation.  No tinnitus or ringing in the ear sensations.  She has had word finding difficulties and memory loss for past 1 year.  She has urinary incontinence for past 5 years.  Also reports some blurred vision, joint pain, disinterest activities, runny nose and itching. Has past history of DVT, rapid heart rate, poor vision, arthritis.  PCP obtain MRI of the brain which showed punctate subacute ischemic infarcts in the cerebellum as well as possibility of normal pressure hydrocephalus.   REVIEW OF SYSTEMS: Full 14 system review of systems performed and negative with exception of: As per HPI.  ALLERGIES: Allergies  Allergen Reactions  . Sulfa Antibiotics Other (See Comments)    Childhood Allergy  05/17/18 patient denies    HOME MEDICATIONS: Outpatient Medications Prior to Visit  Medication Sig Dispense Refill  . atorvastatin (LIPITOR) 10 MG tablet Take 1 tablet (10 mg total) by mouth daily. 90 tablet 3  . Bioflavonoid Products (BIOFLEX PO) Take by mouth.    Marland Kitchen CALCIUM PO Take 1 tablet by mouth daily.    . Cholecalciferol (VITAMIN D PO) Take 1 tablet by mouth daily.    . metoprolol succinate (TOPROL-XL) 25 MG 24 hr tablet Take 0.5 tablets (12.5 mg total) by mouth daily. 45 tablet 3  . Multiple Vitamin (MULTIVITAMIN WITH MINERALS) TABS tablet Take 1 tablet by mouth daily.    .  Multiple Vitamins-Minerals (PRESERVISION AREDS 2) CAPS Take by mouth 2 (two) times daily.     . Omega-3 Fatty Acids (FISH OIL) 1000 MG CAPS Take by mouth daily.    Marland Kitchen omeprazole (PRILOSEC) 10 MG capsule Take 10 mg by mouth daily.    . Psyllium (METAMUCIL PO) Take by mouth 2 (two) times daily.    . timolol (TIMOPTIC) 0.5 % ophthalmic solution Place 1 drop into the left eye daily.     Marland Kitchen aspirin EC 325 MG tablet Take 1 tablet (325 mg total) by mouth daily. 30 tablet 0  . clotrimazole-betamethasone (LOTRISONE) cream Apply 1 application topically 2 (two) times daily. (Patient not taking: Reported on 05/17/2018) 30 g 1  . Magnesium 250 MG TABS Take by mouth.    . methocarbamol (ROBAXIN) 500 MG tablet Take 1 tablet (500 mg total) by mouth every 8 (eight) hours as needed for muscle spasms (muscle tightness). (Patient not taking: Reported on 05/17/2018) 60 tablet 0  . montelukast (SINGULAIR) 10 MG tablet Take 1 tablet (10 mg total) by mouth at bedtime. (Patient not taking: Reported on 05/17/2018) 30 tablet 3  . Red Yeast Rice Extract (RED YEAST RICE PO) Take by mouth 2 (two) times daily.    . RESTASIS 0.05 % ophthalmic emulsion     . esomeprazole (NEXIUM) 20 MG capsule Take 20 mg by mouth daily at 12 noon.     No facility-administered medications prior to visit.     PAST  MEDICAL HISTORY: Past Medical History:  Diagnosis Date  . Arthritis   . Breast cancer ([employer])    left breast/lumpectomy/chemo/rad 2003  . COPD (chronic obstructive pulmonary disease) ([place])   . DDD (degenerative disc disease), lumbar 08/31/2017  . Diverticulitis   . GERD (gastroesophageal reflux disease)   . Hemoptysis   . Hyperlipidemia   . Iron deficiency   . Macular degeneration    bilateral  . Personal history of radiation therapy 2003  . Pulmonary embolism ([place])   . Tachycardia     PAST SURGICAL HISTORY: Past Surgical History:  Procedure Laterality Date  . BACK SURGERY    . BREAST LUMPECTOMY    . COLONOSCOPY   01/28/2012   Procedure: COLONOSCOPY;  Surgeon: Rogene Houston, MD;  [facility];  Service: Endoscopy;  Laterality: N/A;  1200  . COLONOSCOPY  01-2012  . ESOPHAGOGASTRODUODENOSCOPY N/A 07/01/2013   Procedure: ESOPHAGOGASTRODUODENOSCOPY (EGD);  Surgeon: Rogene Houston, MD;  [facility];  Service: Endoscopy;  Laterality: N/A;  1:10  . LAPAROSCOPIC TUBAL LIGATION  1970  . TONSILLECTOMY     Patient was age 16    FAMILY HISTORY: Family History  Problem Relation Age of Onset  . Heart disease Mother   . Alzheimer's disease Mother   . Heart disease Father   . Lung disease Father   . Healthy Son     SOCIAL HISTORY: Social History   Socioeconomic History  . Marital status: Married    Spouse name: Simona Huh  . Number of children: 1  . Years of education: some college  . Highest education level: Not on file  Occupational History  . Not on file  Social Needs  . Financial resource strain: Not on file  . Food insecurity:    Worry: Not on file    Inability: Not on file  . Transportation needs:    Medical: Not on file    Non-medical: Not on file  Tobacco Use  . Smoking status: Current Every Day Smoker    Packs/day: 1.00    Years: 30.00    Pack years: 30.00    Types: Cigarettes  . Smokeless tobacco: Never Used  . Tobacco comment: 05/17/18 1- 1.5 PPD  Substance and Sexual Activity  . Alcohol use: No    Alcohol/week: 0.0 standard drinks  . Drug use: No  . Sexual activity: Not Currently  Lifestyle  . Physical activity:    Days per week: Not on file    Minutes per session: Not on file  . Stress: Not on file  Relationships  . Social connections:    Talks on phone: Not on file    Gets together: Not on file    Attends religious service: Not on file    Active member of club or organization: Not on file    Attends meetings of clubs or organizations: Not on file    Relationship status: Not on file  . Intimate partner violence:    Fear of current or ex partner:  Not on file    Emotionally abused: Not on file    Physically abused: Not on file    Forced sexual activity: Not on file  Other Topics Concern  . Not on file  Social History Narrative   Lives with husband   Caffeine- none     PHYSICAL EXAM  GENERAL EXAM/CONSTITUTIONAL: Vitals:  Vitals:   05/17/18 1304  BP: 118/65  Pulse: 79  Weight: 161 lb (73 kg)  Height: 5'  9" (1.753 m)     Body mass index is 23.78 kg/m. Wt Readings from Last 3 Encounters:  05/17/18 161 lb (73 kg)  05/04/18 163 lb (73.9 kg)  01/01/18 160 lb (72.6 kg)     Patient is in no distress; well developed, nourished and groomed; neck is supple  CARDIOVASCULAR:  Examination of carotid arteries is normal; no carotid bruits  Regular rate and rhythm, no murmurs  Examination of peripheral vascular system by observation and palpation is normal  EYES:  Ophthalmoscopic exam of optic discs and posterior segments is normal; no papilledema or hemorrhages  Vision Screening Comments: 05/17/18 unable, wears progressive lens, macular degeneration, retina issues  MUSCULOSKELETAL:  Gait, strength, tone, movements noted in Neurologic exam below  NEUROLOGIC: MENTAL STATUS:  No flowsheet data found.  awake, alert, oriented to person, place and time  recent and remote memory intact  normal attention and concentration  language fluent, comprehension intact, naming intact  fund of knowledge appropriate  CRANIAL NERVE:   2nd - no papilledema on fundoscopic exam  2nd, 3rd, 4th, 6th - pupils equal and reactive to light, visual fields full to confrontation, extraocular muscles intact, no nystagmus; SACCADIC BREAKDOWN OF SMOOTH PURSUIT  5th - facial sensation symmetric  7th - facial strength symmetric  8th - hearing intact  9th - palate elevates symmetrically, uvula midline  11th - shoulder shrug symmetric  12th - tongue protrusion midline  MOTOR:   normal bulk and tone, full strength in the BUE,  BLE  NO RIGIDITY; NO BRADYKINESIA  NO TREMOR  SENSORY:   normal and symmetric to light touch, temperature, vibration; except VIB < 5 SEC AT TOES  COORDINATION:   finger-nose-finger, fine finger movements SLOW  REFLEXES:   deep tendon reflexes --> BUE 2; KNEES TRACE; ANKLES 0  GAIT/STATION:   UNSTEADY GAIT; SLOW; SHORT STEPS     DIAGNOSTIC DATA (LABS, IMAGING, TESTING) - I reviewed patient records, labs, notes, testing and imaging myself where available.  Lab Results  Component Value Date   WBC 8.9 05/04/2018   HGB 12.2 05/04/2018   HCT 36.7 05/04/2018   MCV 88.9 05/04/2018   PLT 447 (H) 05/04/2018      Component Value Date/Time   NA 139 05/04/2018 1453   K 5.3 05/04/2018 1453   CL 102 05/04/2018 1453   CO2 28 05/04/2018 1453   GLUCOSE 94 05/04/2018 1453   BUN 14 05/04/2018 1453   CREATININE 0.56 (L) 05/04/2018 1453   CALCIUM 9.6 05/04/2018 1453   PROT 6.4 05/04/2018 1453   ALBUMIN 4.0 07/03/2016 0957   AST 15 05/04/2018 1453   ALT 8 05/04/2018 1453   ALKPHOS 80 07/03/2016 0957   BILITOT 0.2 05/04/2018 1453   GFRNONAA >60 11/24/2007 0530   GFRAA  11/24/2007 0530    >60        The eGFR has been calculated using the MDRD equation. This calculation has not been validated in all clinical   Lab Results  Component Value Date   CHOL 231 (H) 05/04/2018   HDL 58 05/04/2018   LDLCALC 146 (H) 05/04/2018   TRIG 143 05/04/2018   CHOLHDL 4.0 05/04/2018   No results found for: HGBA1C Lab Results  Component Value Date   [id number] 937 11/23/2007   Lab Results  Component Value Date   TSH 1.05 05/08/2017    05/11/18 MRI brain (without) [I reviewed images myself and agree with interpretation. -VRP]  1. Punctate acute to early subacute left cerebellar infarct. 2.  Multiple tiny chronic bilateral cerebellar infarcts. 3. Mild chronic small vessel ischemic disease in the cerebral white matter. 4. Moderate lateral and third ventriculomegaly which could reflect  normal pressure hydrocephalus in the appropriate clinical setting.     ASSESSMENT AND PLAN  72 y.o. year old female here with gait and balance difficulty, word finding difficulties, urinary incontinence since at least 2018.   Dx:  1. Cerebrovascular accident (CVA), unspecified mechanism ([place])   2. NPH (normal pressure hydrocephalus) ([facility])      PLAN:  STROKE PREVENTION - continue aspirin 325, statin, BP control - check TTE  VENTICULOMEGALY / DIZZINESS (? NPH vs central atrophy) - check LP; monitor for clinical improvement  Orders Placed This Encounter  Procedures  . DG FLUORO GUIDED LOC OF NEEDLE/CATH TIP FOR SPINAL INJECT LT  . ECHOCARDIOGRAM COMPLETE   Return in about 3 months (around 08/16/2018).    Penni Bombard, MD 04/75/3391, 7:92 PM Certified in Neurology, Neurophysiology and Neuroimaging  [facility] [address] [place], [place] [address] [phone]

## 2018-05-20 DIAGNOSIS — H35372 Puckering of macula, left eye: Secondary | ICD-10-CM | POA: Diagnosis not present

## 2018-05-20 DIAGNOSIS — H353222 Exudative age-related macular degeneration, left eye, with inactive choroidal neovascularization: Secondary | ICD-10-CM | POA: Diagnosis not present

## 2018-05-20 DIAGNOSIS — H353211 Exudative age-related macular degeneration, right eye, with active choroidal neovascularization: Secondary | ICD-10-CM | POA: Diagnosis not present

## 2018-05-20 DIAGNOSIS — H26491 Other secondary cataract, right eye: Secondary | ICD-10-CM | POA: Diagnosis not present

## 2018-05-31 ENCOUNTER — Other Ambulatory Visit (HOSPITAL_COMMUNITY): Payer: Self-pay | Admitting: Family Medicine

## 2018-05-31 DIAGNOSIS — Z1231 Encounter for screening mammogram for malignant neoplasm of breast: Secondary | ICD-10-CM

## 2018-06-01 ENCOUNTER — Other Ambulatory Visit: Payer: Self-pay

## 2018-06-01 ENCOUNTER — Ambulatory Visit (HOSPITAL_COMMUNITY): Payer: Medicare HMO | Attending: Cardiology

## 2018-06-01 DIAGNOSIS — I639 Cerebral infarction, unspecified: Secondary | ICD-10-CM | POA: Diagnosis not present

## 2018-06-04 ENCOUNTER — Ambulatory Visit
Admission: RE | Admit: 2018-06-04 | Discharge: 2018-06-04 | Disposition: A | Discharge status: Home / Self Care | Payer: Medicare HMO | Source: Ambulatory Visit | Attending: Diagnostic Neuroimaging | Admitting: Diagnostic Neuroimaging

## 2018-06-04 VITALS — BP 138/54 | HR 93

## 2018-06-04 DIAGNOSIS — G912 (Idiopathic) normal pressure hydrocephalus: Secondary | ICD-10-CM

## 2018-06-04 DIAGNOSIS — G919 Hydrocephalus, unspecified: Secondary | ICD-10-CM | POA: Diagnosis not present

## 2018-06-04 NOTE — Discharge Instructions (Signed)

## 2018-06-08 ENCOUNTER — Telehealth: Payer: Self-pay | Admitting: *Deleted

## 2018-06-08 LAB — CSF CELL COUNT WITH DIFFERENTIAL
RBC COUNT CSF: 0 {cells}/uL (ref 0–10)
WBC CSF: 1 {cells}/uL (ref 0–5)

## 2018-06-08 LAB — GLUCOSE, CSF: Glucose, CSF: 62 mg/dL (ref 40–80)

## 2018-06-08 LAB — CSF CULTURE W GRAM STAIN
MICRO NUMBER:: 8453
Result:: NO GROWTH
SPECIMEN QUALITY:: ADEQUATE

## 2018-06-08 LAB — PROTEIN, CSF: Total Protein, CSF: 35 mg/dL (ref 15–60)

## 2018-06-08 NOTE — Telephone Encounter (Signed)
LVM informing the patient that her echocardiogram results are unremarkable, and there are no major findings.  Advised she continue with Dr Gladstone Lighter plan per last office note.  Left number for any questions.

## 2018-06-08 NOTE — Telephone Encounter (Signed)
Received lab results for spinal tap done 06-04-18.  Glucose 62, protein 35, cell count and diff:  Color colorless, appearance clear, WBC 1,  Culture prelim no wbc seen, no organisms seen result: no growth to date.

## 2018-06-14 ENCOUNTER — Ambulatory Visit (HOSPITAL_COMMUNITY)
Admission: RE | Admit: 2018-06-14 | Discharge: 2018-06-14 | Disposition: A | Discharge status: Home / Self Care | Payer: Medicare HMO | Source: Ambulatory Visit | Attending: Family Medicine | Admitting: Family Medicine

## 2018-06-14 ENCOUNTER — Encounter (HOSPITAL_COMMUNITY): Payer: Self-pay

## 2018-06-14 DIAGNOSIS — Z1231 Encounter for screening mammogram for malignant neoplasm of breast: Secondary | ICD-10-CM

## 2018-06-18 DIAGNOSIS — H353222 Exudative age-related macular degeneration, left eye, with inactive choroidal neovascularization: Secondary | ICD-10-CM | POA: Diagnosis not present

## 2018-06-18 DIAGNOSIS — H35372 Puckering of macula, left eye: Secondary | ICD-10-CM | POA: Diagnosis not present

## 2018-06-18 DIAGNOSIS — H353211 Exudative age-related macular degeneration, right eye, with active choroidal neovascularization: Secondary | ICD-10-CM | POA: Diagnosis not present

## 2018-06-23 ENCOUNTER — Other Ambulatory Visit: Payer: Self-pay | Admitting: Family Medicine

## 2018-07-05 ENCOUNTER — Encounter: Payer: Self-pay | Admitting: Diagnostic Neuroimaging

## 2018-07-05 ENCOUNTER — Ambulatory Visit (INDEPENDENT_AMBULATORY_CARE_PROVIDER_SITE_OTHER): Payer: Medicare HMO | Admitting: Diagnostic Neuroimaging

## 2018-07-05 VITALS — BP 134/73 | HR 107 | Ht 69.0 in | Wt 167.0 lb

## 2018-07-05 DIAGNOSIS — I639 Cerebral infarction, unspecified: Secondary | ICD-10-CM | POA: Diagnosis not present

## 2018-07-05 DIAGNOSIS — G912 (Idiopathic) normal pressure hydrocephalus: Secondary | ICD-10-CM

## 2018-07-05 NOTE — Progress Notes (Signed)
[facility]  PATIENT: [name] DOB: 1945/09/11  REFERRING CLINICIAN: K [place] HISTORY FROM: patient and husband  REASON FOR VISIT: follow up    HISTORICAL  CHIEF COMPLAINT:  Chief Complaint  Patient presents with  . Cerebrovascular Accident    rm 7, husband- Simona Huh, "LP last Friday, I was good Sat, Sun but now unsteady on my feet, have to be careful while looking side to side or up- need to hold on; no falls"   . Follow-up    HISTORY OF PRESENT ILLNESS:   UPDATE (07/05/18, VRP): Since last visit, had LP on 06/04/18. Had 2 days benefit after LP, and now back to baseline. Dizziness, balance, walking improved. No alleviating or aggravating factors.   PRIOR HPI (05/17/18): 73 year old female here for evaluation of dizziness and gait difficulty.  Patient had 2 major falls in June and November 2019.  She is also felt a "crazy head" sensation for the past 1 year.  She has balance difficulty, fogginess, rocking sensation.  No tinnitus or ringing in the ear sensations.  She has had word finding difficulties and memory loss for past 1 year.  She has urinary incontinence for past 5 years.  Also reports some blurred vision, joint pain, disinterest activities, runny nose and itching. Has past history of DVT, rapid heart rate, poor vision, arthritis.  PCP obtain MRI of the brain which showed punctate subacute ischemic infarcts in the cerebellum as well as possibility of normal pressure hydrocephalus.   REVIEW OF SYSTEMS: Full 14 system review of systems performed and negative with exception of: as per HPI.   ALLERGIES: Allergies  Allergen Reactions  . Sulfa Antibiotics Other (See Comments)    Childhood Allergy  05/17/18 patient denies    HOME MEDICATIONS: Outpatient Medications Prior to Visit  Medication Sig Dispense Refill  . aspirin EC 325 MG tablet Take 1 tablet (325 mg total) by mouth daily. 30 tablet 0  . atorvastatin (LIPITOR) 10 MG tablet Take 1 tablet  (10 mg total) by mouth daily. 90 tablet 3  . Bioflavonoid Products (BIOFLEX PO) Take by mouth.    Marland Kitchen CALCIUM PO Take 1 tablet by mouth daily.    . Cholecalciferol (VITAMIN D PO) Take 1 tablet by mouth daily.    . metoprolol succinate (TOPROL-XL) 25 MG 24 hr tablet TAKE 1/2 TABLET EVERY DAY 45 tablet 3  . Multiple Vitamin (MULTIVITAMIN WITH MINERALS) TABS tablet Take 1 tablet by mouth daily.    . Multiple Vitamins-Minerals (PRESERVISION AREDS 2) CAPS Take by mouth 2 (two) times daily.     . Omega-3 Fatty Acids (FISH OIL) 1000 MG CAPS Take by mouth daily.    . Psyllium (METAMUCIL PO) Take by mouth 2 (two) times daily.    . timolol (TIMOPTIC) 0.5 % ophthalmic solution Place 1 drop into the left eye daily.     . clotrimazole-betamethasone (LOTRISONE) cream Apply 1 application topically 2 (two) times daily. (Patient not taking: Reported on 05/17/2018) 30 g 1  . Magnesium 250 MG TABS Take by mouth.    . methocarbamol (ROBAXIN) 500 MG tablet Take 1 tablet (500 mg total) by mouth every 8 (eight) hours as needed for muscle spasms (muscle tightness). (Patient not taking: Reported on 05/17/2018) 60 tablet 0  . montelukast (SINGULAIR) 10 MG tablet Take 1 tablet (10 mg total) by mouth at bedtime. (Patient not taking: Reported on 05/17/2018) 30 tablet 3  . omeprazole (PRILOSEC) 10 MG capsule Take 10 mg by mouth daily.    Marland Kitchen  Red Yeast Rice Extract (RED YEAST RICE PO) Take by mouth 2 (two) times daily.    . RESTASIS 0.05 % ophthalmic emulsion      No facility-administered medications prior to visit.     PAST MEDICAL HISTORY: Past Medical History:  Diagnosis Date  . Arthritis   . Breast cancer ([place])    left breast/lumpectomy/chemo/rad 2003  . COPD (chronic obstructive pulmonary disease) ([place])   . DDD (degenerative disc disease), lumbar 08/31/2017  . Diverticulitis   . GERD (gastroesophageal reflux disease)   . Hemoptysis   . Hyperlipidemia   . Iron deficiency   . Macular degeneration    bilateral  .  Personal history of radiation therapy 2003  . Pulmonary embolism ([place])   . Tachycardia     PAST SURGICAL HISTORY: Past Surgical History:  Procedure Laterality Date  . BACK SURGERY    . BREAST LUMPECTOMY    . COLONOSCOPY  01/28/2012   Procedure: COLONOSCOPY;  Surgeon: Rogene Houston, MD;  [facility];  Service: Endoscopy;  Laterality: N/A;  1200  . COLONOSCOPY  01-2012  . ESOPHAGOGASTRODUODENOSCOPY N/A 07/01/2013   Procedure: ESOPHAGOGASTRODUODENOSCOPY (EGD);  Surgeon: Rogene Houston, MD;  [facility];  Service: Endoscopy;  Laterality: N/A;  1:10  . LAPAROSCOPIC TUBAL LIGATION  1970  . TONSILLECTOMY     Patient was age 73    FAMILY HISTORY: Family History  Problem Relation Age of Onset  . Heart disease Mother   . Alzheimer's disease Mother   . Heart disease Father   . Lung disease Father   . Healthy Son     SOCIAL HISTORY: Social History   Socioeconomic History  . Marital status: Married    Spouse name: Simona Huh  . Number of children: 1  . Years of education: some college  . Highest education level: Not on file  Occupational History  . Not on file  Social Needs  . Financial resource strain: Not on file  . Food insecurity:    Worry: Not on file    Inability: Not on file  . Transportation needs:    Medical: Not on file    Non-medical: Not on file  Tobacco Use  . Smoking status: Current Every Day Smoker    Packs/day: 1.00    Years: 30.00    Pack years: 30.00    Types: Cigarettes  . Smokeless tobacco: Never Used  . Tobacco comment: 05/17/18 1- 1.5 PPD  Substance and Sexual Activity  . Alcohol use: No    Alcohol/week: 0.0 standard drinks  . Drug use: No  . Sexual activity: Not Currently  Lifestyle  . Physical activity:    Days per week: Not on file    Minutes per session: Not on file  . Stress: Not on file  Relationships  . Social connections:    Talks on phone: Not on file    Gets together: Not on file    Attends religious  service: Not on file    Active member of club or organization: Not on file    Attends meetings of clubs or organizations: Not on file    Relationship status: Not on file  . Intimate partner violence:    Fear of current or ex partner: Not on file    Emotionally abused: Not on file    Physically abused: Not on file    Forced sexual activity: Not on file  Other Topics Concern  . Not on file  Social History Narrative  Lives with husband   Caffeine- none     PHYSICAL EXAM  GENERAL EXAM/CONSTITUTIONAL: Vitals:  Vitals:   07/05/18 1628  BP: 134/73  Pulse: (!) 107  Weight: 167 lb (75.8 kg)  Height: '5\' 9"'  (1.753 m)   Body mass index is 24.66 kg/m. Wt Readings from Last 3 Encounters:  07/05/18 167 lb (75.8 kg)  05/17/18 161 lb (73 kg)  05/04/18 163 lb (73.9 kg)    Patient is in no distress; well developed, nourished and groomed; neck is supple  CARDIOVASCULAR:  Examination of carotid arteries is normal; no carotid bruits  Regular rate and rhythm, no murmurs  Examination of peripheral vascular system by observation and palpation is normal  EYES:  Ophthalmoscopic exam of optic discs and posterior segments is normal; no papilledema or hemorrhages No exam data present  MUSCULOSKELETAL:  Gait, strength, tone, movements noted in Neurologic exam below  NEUROLOGIC: MENTAL STATUS:  No flowsheet data found.  awake, alert, oriented to person, place and time  recent and remote memory intact  normal attention and concentration  language fluent, comprehension intact, naming intact  fund of knowledge appropriate  CRANIAL NERVE:   2nd - no papilledema on fundoscopic exam  2nd, 3rd, 4th, 6th - pupils equal and reactive to light, visual fields full to confrontation, extraocular muscles intact, no nystagmus; SACCADIC BREAKDOWN OF SMOOTH PURSUIT  5th - facial sensation symmetric  7th - facial strength symmetric  8th - hearing intact  9th - palate elevates  symmetrically, uvula midline  11th - shoulder shrug symmetric  12th - tongue protrusion midline  MOTOR:   normal bulk and tone, full strength in the BUE, BLE  NO RIGIDITY; NO BRADYKINESIA  NO TREMOR  SENSORY:   normal and symmetric to light touch, temperature, vibration; except VIB < 5 SEC AT TOES  COORDINATION:   finger-nose-finger, fine finger movements SLOW  REFLEXES:   deep tendon reflexes --> BUE 2; KNEES TRACE; ANKLES 0  GAIT/STATION:   VERY UNSTEADY GAIT; SHORT STAGGERING STEPS     DIAGNOSTIC DATA (LABS, IMAGING, TESTING) - I reviewed patient records, labs, notes, testing and imaging myself where available.  Lab Results  Component Value Date   WBC 8.9 05/04/2018   HGB 12.2 05/04/2018   HCT 36.7 05/04/2018   MCV 88.9 05/04/2018   PLT 447 (H) 05/04/2018      Component Value Date/Time   NA 139 05/04/2018 1453   K 5.3 05/04/2018 1453   CL 102 05/04/2018 1453   CO2 28 05/04/2018 1453   GLUCOSE 94 05/04/2018 1453   BUN 14 05/04/2018 1453   CREATININE 0.56 (L) 05/04/2018 1453   CALCIUM 9.6 05/04/2018 1453   PROT 6.4 05/04/2018 1453   ALBUMIN 4.0 07/03/2016 0957   AST 15 05/04/2018 1453   ALT 8 05/04/2018 1453   ALKPHOS 80 07/03/2016 0957   BILITOT 0.2 05/04/2018 1453   GFRNONAA >60 11/24/2007 0530   GFRAA  11/24/2007 0530    >60        The eGFR has been calculated using the MDRD equation. This calculation has not been validated in all clinical   Lab Results  Component Value Date   CHOL 231 (H) 05/04/2018   HDL 58 05/04/2018   LDLCALC 146 (H) 05/04/2018   TRIG 143 05/04/2018   CHOLHDL 4.0 05/04/2018   No results found for: HGBA1C Lab Results  Component Value Date   [id number] 453 11/23/2007   Lab Results  Component Value Date   TSH 1.05  05/08/2017    05/11/18 MRI brain (without) [I reviewed images myself and agree with interpretation. -VRP]  1. Punctate acute to early subacute left cerebellar infarct. 2. Multiple tiny chronic  bilateral cerebellar infarcts. 3. Mild chronic small vessel ischemic disease in the cerebral white matter. 4. Moderate lateral and third ventriculomegaly which could reflect normal pressure hydrocephalus in the appropriate clinical setting.  06/04/18 LP - opening pressure of 9 cm water (measured in the left lateral decubitus position). 32 mL of CSF were obtained and sent for the requested laboratory studies. Closing pressure was 0/not measurable. The patient tolerated the procedure well and there were no apparent complications. IMPRESSION: Successful high volume lumbar puncture.     ASSESSMENT AND PLAN  73 y.o. year old female here with gait and balance difficulty, word finding difficulties, urinary incontinence since at least 2018. Some temporary benefit with LP on 06/04/18.   Dx:  1. NPH (normal pressure hydrocephalus) ([place])   2. Cerebrovascular accident (CVA), unspecified mechanism ([place])     PLAN:  VENTICULOMEGALY / DIZZINESS (? NPH vs central atrophy) - had some benefit with LP (06/04/18), with improved balance, dizziness and memory - will monitor symptoms; may consider another diagnostic LP (this time with pre and post PT evaluation); may consider neurosurgery evaluation; patient will let us know  STROKE PREVENTION - continue aspirin 325, statin, BP control - check TTE  Return in about 4 months (around 11/03/2018).    Penni Bombard, MD 06/07/1094, 0:45 PM Certified in Neurology, Neurophysiology and Neuroimaging  [facility] [address], [place], [place] [address] [phone]

## 2018-07-09 ENCOUNTER — Telehealth: Payer: Self-pay | Admitting: Diagnostic Neuroimaging

## 2018-07-09 DIAGNOSIS — G912 (Idiopathic) normal pressure hydrocephalus: Secondary | ICD-10-CM

## 2018-07-09 NOTE — Telephone Encounter (Signed)
Pt is wanting to proceed with appointment to neurosurgeon. Please call to advise. She is aware the Dr Mamie Nick is not in the office today and the clinic closes at noon today. She is aware this will be handled on Monday

## 2018-07-12 NOTE — Telephone Encounter (Signed)
Patient needs NPH evaluation for shunt. Please ask patient is she wants evaluation in [place], [place] or [facility]. -VRP

## 2018-07-12 NOTE — Telephone Encounter (Signed)
LVM requesting patient call back and advise phone staff whether she wants neurosurgery eval at [place], [place] or [facility]. Left office number.

## 2018-07-14 NOTE — Telephone Encounter (Signed)
Called patient who stated she wants neurosurgery referral in [place]. I advised her the referral will be placed today. She verbalized understanding, appreciation.

## 2018-07-14 NOTE — Telephone Encounter (Signed)
Orders Placed This Encounter  Procedures  . Ambulatory referral to Neurosurgery   Penni Bombard, MD 8/67/6195, 0:93 PM Certified in Neurology, Neurophysiology and Neuroimaging  [facility] [address], [place], [place] [address] [phone]

## 2018-07-21 DIAGNOSIS — R030 Elevated blood-pressure reading, without diagnosis of hypertension: Secondary | ICD-10-CM | POA: Diagnosis not present

## 2018-07-21 DIAGNOSIS — G912 (Idiopathic) normal pressure hydrocephalus: Secondary | ICD-10-CM | POA: Diagnosis not present

## 2018-07-30 DIAGNOSIS — H353222 Exudative age-related macular degeneration, left eye, with inactive choroidal neovascularization: Secondary | ICD-10-CM | POA: Diagnosis not present

## 2018-07-30 DIAGNOSIS — H353211 Exudative age-related macular degeneration, right eye, with active choroidal neovascularization: Secondary | ICD-10-CM | POA: Diagnosis not present

## 2018-08-16 ENCOUNTER — Ambulatory Visit: Payer: Medicare HMO | Admitting: Diagnostic Neuroimaging

## 2018-09-24 DIAGNOSIS — H353211 Exudative age-related macular degeneration, right eye, with active choroidal neovascularization: Secondary | ICD-10-CM | POA: Diagnosis not present

## 2018-11-04 ENCOUNTER — Telehealth: Payer: Self-pay | Admitting: *Deleted

## 2018-11-04 NOTE — Telephone Encounter (Signed)
[place] for video visit. NSGY offered surgery if patient agrees. -VRP

## 2018-11-04 NOTE — Telephone Encounter (Addendum)
Called patient to discuss. She stated she saw [facility], and they told her all they could do was surgery which wouldn't help her balance. It would help her walking but not her head. I asked what is bothering her about her head. She stated it's not dizziness, but she was unable to explain further. She reported she uses a cane a lot. She stated the LP "improved her head and walking x 2 days" but then "it came back". She stated she wasn't going to have surgery if it only helped one of her problems.  I advised her that Dr Leta Baptist could help her to better understand what the neurosurgeon was telling her, and he would at least like to have a telephone visit with her.  She was on her way to a dental appointment, so I advised I will let Dr Leta Baptist know and give her a call back Patient verbalized understanding, appreciation. [facility] and had office visit note faxed over, placed on Dr [facility] desk for review.

## 2018-11-04 NOTE — Telephone Encounter (Signed)
LVM informing the patient that Dr Leta Baptist reviewed neurosurgeons' notes. He stated she may schedule video visit to discuss with her and her husband or in office visit later this summer. The decision for surgery is up to her.  I advised she call back if she would like to schedule.

## 2018-11-04 NOTE — Telephone Encounter (Signed)
Pt has called, she asked the appointment be cancelled.  Pt will call back when she needs to be seen

## 2018-11-04 NOTE — Telephone Encounter (Signed)
Pt returned call and stated she didn't want to schedule at visit at this time.

## 2018-11-04 NOTE — Telephone Encounter (Signed)
LVM informing patient due to current COVID 19 pandemic, our office is severely reducing in person visits in order to minimize the risk to our patients and healthcare providers. We recommend to convert your appointment to a video visit. I requested she call back today to discuss video visit or rescheduling for Tues afternoon in June or July for in office visit. I advised her we are closed tomorrow. Left office #.

## 2018-11-04 NOTE — Telephone Encounter (Signed)
Noted  

## 2018-11-05 ENCOUNTER — Ambulatory Visit (INDEPENDENT_AMBULATORY_CARE_PROVIDER_SITE_OTHER): Payer: Medicare HMO | Admitting: Family Medicine

## 2018-11-05 ENCOUNTER — Other Ambulatory Visit: Payer: Self-pay

## 2018-11-05 VITALS — BP 122/70 | HR 89 | Temp 98.4°F | Resp 18 | Ht 68.0 in | Wt 164.8 lb

## 2018-11-05 DIAGNOSIS — M5136 Other intervertebral disc degeneration, lumbar region: Secondary | ICD-10-CM

## 2018-11-05 DIAGNOSIS — M159 Polyosteoarthritis, unspecified: Secondary | ICD-10-CM

## 2018-11-05 DIAGNOSIS — M25552 Pain in left hip: Secondary | ICD-10-CM | POA: Diagnosis not present

## 2018-11-05 MED ORDER — DICLOFENAC SODIUM 75 MG PO TBEC: 75.0000 mg | DELAYED_RELEASE_TABLET | Freq: Two times a day (BID) | ORAL | 1 refills | Status: DC

## 2018-11-05 NOTE — Patient Instructions (Addendum)
F/U Nov for Physical  Referral to orthopedics

## 2018-11-05 NOTE — Progress Notes (Signed)
   Subjective:    Patient ID: [name], female    DOB: 03-Sep-1945, 73 y.o.   MRN: [id number]  Patient presents for Back Pain (chronic); Hip Pain (chronic); and Leg Pain (chronic)  Chronic back, hip and leg pain. Has known OA and DD lumbar  she has OA knees, told no cushion , left hip gives her the most problem Had physical therapy in the fall , still difficulty with joints Walks with cane No recent falls    Has chronic back Pain radiates over to left hip and down his hip past th e knee Pain with walking, sitting Given ultracet last year, minimal improvement, No change in bowel or bladder   She has been taking 12 ibuprofen a day  Wants to go back on diclofenac   Review Of Systems:  GEN- denies fatigue, fever, weight loss,weakness, recent illness HEENT- denies eye drainage, change in vision, nasal discharge, CVS- denies chest pain, palpitations RESP- denies SOB, cough, wheeze ABD- denies N/V, change in stools, abd pain GU- denies dysuria, hematuria, dribbling, incontinence MSK- + joint pain, muscle aches, injury Neuro- denies headache, dizziness, syncope, seizure activity       Objective:    BP 122/70   Pulse 89   Temp 98.4 F (36.9 C)   Resp 18   Ht 5\' 8"  (1.727 m)   Wt 164 lb 12.8 oz (74.8 kg)   SpO2 94%   BMI 25.06 kg/m  GEN- NAD, alert and oriented x3 HEENT- PERRL, EOMI, non injected sclera, pink conjunctiva, MMM, oropharynx clear Neck- Supple, fair ROM c spine NT CVS- RRR, no murmur RESP-CTAB MSK- mild ttp lumbar spine, decreased ROM spine, hips, knees, neg SLR,+crepitus of knees, antalgic stiff gait Neuro- sensation in tact LE, strength 4+/5 bilat  EXT- No edema Pulses- Radial, DP- 2+        Assessment & Plan:      Problem List Items Addressed This Visit      Unprioritized   DDD (degenerative disc disease), lumbar    Known generalized OA and DDD of spine, some sciatica symptoms as well, but also isolated hip pain Has done PT Will restart  diclofenac, for now take 1 a day, recent renal function okay Referral to orthopedics for the hip and knee Right hip xray last year normal  Referral to orthopedics      Relevant Medications   diclofenac (VOLTAREN) 75 MG EC tablet   Other Relevant Orders   Ambulatory referral to Orthopedic Surgery   Generalized OA - Primary   Relevant Medications   diclofenac (VOLTAREN) 75 MG EC tablet   Other Relevant Orders   Ambulatory referral to Orthopedic Surgery    Other Visit Diagnoses    Left hip pain       Relevant Orders   Ambulatory referral to Orthopedic Surgery      Note: This dictation was prepared with Dragon dictation along with smaller phrase technology. Any transcriptional errors that result from this process are unintentional.

## 2018-11-07 ENCOUNTER — Encounter: Payer: Self-pay | Admitting: Family Medicine

## 2018-11-07 NOTE — Assessment & Plan Note (Addendum)
Known generalized OA and DDD of spine, some sciatica symptoms as well, but also isolated hip pain Has done PT Will restart diclofenac, for now take 1 a day, recent renal function okay Referral to orthopedics for the hip and knee Right hip xray last year normal  Referral to orthopedics

## 2018-11-08 ENCOUNTER — Ambulatory Visit: Payer: Medicare HMO | Admitting: Diagnostic Neuroimaging

## 2018-11-16 DIAGNOSIS — Z961 Presence of intraocular lens: Secondary | ICD-10-CM | POA: Diagnosis not present

## 2018-11-16 DIAGNOSIS — H401133 Primary open-angle glaucoma, bilateral, severe stage: Secondary | ICD-10-CM | POA: Diagnosis not present

## 2018-11-16 DIAGNOSIS — H04123 Dry eye syndrome of bilateral lacrimal glands: Secondary | ICD-10-CM | POA: Diagnosis not present

## 2018-11-16 DIAGNOSIS — H353221 Exudative age-related macular degeneration, left eye, with active choroidal neovascularization: Secondary | ICD-10-CM | POA: Diagnosis not present

## 2018-11-16 DIAGNOSIS — H524 Presbyopia: Secondary | ICD-10-CM | POA: Diagnosis not present

## 2018-11-19 DIAGNOSIS — H353211 Exudative age-related macular degeneration, right eye, with active choroidal neovascularization: Secondary | ICD-10-CM | POA: Diagnosis not present

## 2018-11-24 ENCOUNTER — Ambulatory Visit: Payer: Medicare HMO | Admitting: Orthopedic Surgery

## 2018-12-01 ENCOUNTER — Ambulatory Visit (INDEPENDENT_AMBULATORY_CARE_PROVIDER_SITE_OTHER): Payer: Medicare HMO

## 2018-12-01 ENCOUNTER — Ambulatory Visit (INDEPENDENT_AMBULATORY_CARE_PROVIDER_SITE_OTHER): Payer: Medicare HMO | Admitting: Orthopedic Surgery

## 2018-12-01 ENCOUNTER — Other Ambulatory Visit: Payer: Self-pay

## 2018-12-01 ENCOUNTER — Encounter: Payer: Self-pay | Admitting: Orthopedic Surgery

## 2018-12-01 VITALS — BP 157/90 | HR 89 | Temp 98.4°F | Ht 68.0 in | Wt 168.0 lb

## 2018-12-01 DIAGNOSIS — M25562 Pain in left knee: Secondary | ICD-10-CM | POA: Diagnosis not present

## 2018-12-01 DIAGNOSIS — M1120 Other chondrocalcinosis, unspecified site: Secondary | ICD-10-CM | POA: Diagnosis not present

## 2018-12-01 DIAGNOSIS — M25561 Pain in right knee: Secondary | ICD-10-CM

## 2018-12-01 DIAGNOSIS — G8929 Other chronic pain: Secondary | ICD-10-CM

## 2018-12-01 NOTE — Progress Notes (Signed)
NEW PROBLEM OFFICE VISIT  Chief Complaint  Patient presents with  . Knee Pain    Bilat knee pain R > L but goes back and forth. Pain worse going up stairs.     73 year old female presents for evaluation of bilateral knee pain which she actually reports is mild she has intermittent giving way symptoms of her lower extremities with a history of lower back pain.  Quality of pain again is dull severity is mild duration now several months with history of prior evaluation in [place] many years ago.  She started taking diclofenac again which seems to make it a little bit better.  Pain is constant   Review of Systems  Respiratory: Positive for cough and shortness of breath.   Gastrointestinal: Positive for heartburn.  Musculoskeletal: Positive for back pain, falls, joint pain and neck pain.  Skin: Positive for itching.  Neurological: Positive for dizziness.     Past Medical History:  Diagnosis Date  . Arthritis   . Breast cancer ([place])    left breast/lumpectomy/chemo/rad 2003  . COPD (chronic obstructive pulmonary disease) ([place])   . DDD (degenerative disc disease), lumbar 08/31/2017  . Diverticulitis   . GERD (gastroesophageal reflux disease)   . Hemoptysis   . Hyperlipidemia   . Iron deficiency   . Macular degeneration    bilateral  . Personal history of radiation therapy 2003  . Pulmonary embolism ([place])   . Tachycardia     Past Surgical History:  Procedure Laterality Date  . BACK SURGERY    . BREAST LUMPECTOMY    . COLONOSCOPY  01/28/2012   Procedure: COLONOSCOPY;  Surgeon: Rogene Houston, MD;  Location[facility];  Service: Endoscopy;  Laterality: N/A;  1200  . COLONOSCOPY  01-2012  . ESOPHAGOGASTRODUODENOSCOPY N/A 07/01/2013   Procedure: ESOPHAGOGASTRODUODENOSCOPY (EGD);  Surgeon: Rogene Houston, MD;  Location: [facility];  Service: Endoscopy;  Laterality: N/A;  1:10  . LAPAROSCOPIC TUBAL LIGATION  1970  . TONSILLECTOMY     Patient was age 41    Family History   Problem Relation Age of Onset  . Heart disease Mother   . Alzheimer's disease Mother   . Heart disease Father   . Lung disease Father   . Healthy Son    Social History   Tobacco Use  . Smoking status: Current Every Day Smoker    Packs/day: 1.00    Years: 30.00    Pack years: 30.00    Types: Cigarettes  . Smokeless tobacco: Never Used  . Tobacco comment: 05/17/18 1- 1.5 PPD  Substance Use Topics  . Alcohol use: No    Alcohol/week: 0.0 standard drinks  . Drug use: No    Allergies  Allergen Reactions  . Sulfa Antibiotics Other (See Comments)    Childhood Allergy  05/17/18 patient denies    Current Meds  Medication Sig  . aspirin EC 325 MG tablet Take 1 tablet (325 mg total) by mouth daily.  Marland Kitchen atorvastatin (LIPITOR) 10 MG tablet Take 1 tablet (10 mg total) by mouth daily.  Marland Kitchen Bioflavonoid Products (BIOFLEX PO) Take by mouth.  Marland Kitchen CALCIUM PO Take 1 tablet by mouth daily.  . Cholecalciferol (VITAMIN D PO) Take 1 tablet by mouth daily.  . diclofenac (VOLTAREN) 75 MG EC tablet Take 1 tablet (75 mg total) by mouth 2 (two) times daily.  . metoprolol succinate (TOPROL-XL) 25 MG 24 hr tablet TAKE 1/2 TABLET EVERY DAY  . Multiple Vitamin (MULTIVITAMIN WITH MINERALS) TABS tablet  Take 1 tablet by mouth daily.  . Multiple Vitamins-Minerals (PRESERVISION AREDS 2) CAPS Take by mouth 2 (two) times daily.   . Omega-3 Fatty Acids (FISH OIL) 1000 MG CAPS Take by mouth daily.  Marland Kitchen omeprazole (PRILOSEC) 10 MG capsule Take 10 mg by mouth daily.  . Psyllium (METAMUCIL PO) Take by mouth 2 (two) times daily.  . RESTASIS 0.05 % ophthalmic emulsion   . timolol (TIMOPTIC) 0.5 % ophthalmic solution Place 1 drop into the left eye daily.     BP (!) 157/90   Pulse 89   Temp 98.4 F (36.9 C)   Ht 5\' 8"  (1.727 m)   Wt 168 lb (76.2 kg)   BMI 25.54 kg/m   Physical Exam Vitals signs and nursing note reviewed.  Constitutional:      Appearance: Normal appearance.  Neurological:     Mental Status:  She is alert and oriented to person, place, and time.  Psychiatric:        Mood and Affect: Mood normal.     Ortho Exam Left knee alignment normal range of motion normal stability test normal strength normal muscle tone normal skin normal pulse normal temperature normal sensation normal  McMurray's negative  Tenderness medial lateral joint line no effusion  Right knee alignment normal.  No restrictions in range of motion ligaments are stable skin is clean pulse and temperature normal sensation normal no McMurray sign.  Joint lines are tender without effusion   MEDICAL DECISION SECTION  Xrays were done at [facility]  My independent reading of xrays:  See the report she has relatively normal knees with chondrocalcinosis  Encounter Diagnoses  Name Primary?  . Chronic pain of right knee Yes  . Chronic pain of left knee   . Chondrocalcinosis     PLAN: (Rx., injectx, surgery, frx, mri/ct) voltaren Procedure note for bilateral knee injections  Procedure note left knee injection verbal consent was obtained to inject left knee joint  Timeout was completed to confirm the site of injection  The medications used were 40 mg of Depo-Medrol and 1% lidocaine 3 cc  Anesthesia was provided by ethyl chloride and the skin was prepped with alcohol.  After cleaning the skin with alcohol a 20-gauge needle was used to inject the left knee joint. There were no complications. A sterile bandage was applied.   Procedure note right knee injection verbal consent was obtained to inject right knee joint  Timeout was completed to confirm the site of injection  The medications used were 40 mg of Depo-Medrol and 1% lidocaine 3 cc  Anesthesia was provided by ethyl chloride and the skin was prepped with alcohol.  After cleaning the skin with alcohol a 20-gauge needle was used to inject the right knee joint. There were no complications. A sterile bandage was applied.   Inject  3- 4 x per year   Knees really arent bad ito OA sen on Xray. Has lumbar pain more likely to cause the leg giving out.  No orders of the defined types were placed in this encounter.   Arther Abbott, MD  12/01/2018 3:16 PM

## 2018-12-22 DIAGNOSIS — H04123 Dry eye syndrome of bilateral lacrimal glands: Secondary | ICD-10-CM | POA: Diagnosis not present

## 2018-12-22 DIAGNOSIS — H16223 Keratoconjunctivitis sicca, not specified as Sjogren's, bilateral: Secondary | ICD-10-CM | POA: Diagnosis not present

## 2018-12-22 DIAGNOSIS — H401134 Primary open-angle glaucoma, bilateral, indeterminate stage: Secondary | ICD-10-CM | POA: Diagnosis not present

## 2018-12-22 DIAGNOSIS — H1859 Other hereditary corneal dystrophies: Secondary | ICD-10-CM | POA: Diagnosis not present

## 2019-01-04 ENCOUNTER — Telehealth: Payer: Self-pay | Admitting: *Deleted

## 2019-01-04 NOTE — Telephone Encounter (Signed)
Received call from patient. [phone]~ telephone.   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 States that she has noticed some hard scab like areas surrounding the veins. Reports that on Sunday, she was bathing and knocked a hard scabby area off. States that she profusely bleed from area for about 15 minutes before she could get out of the shower. States that it took approximately 25 minutes to completely stop the bleeding.   Reports that she was seen by [facility] in the past for vascular congestion, but was advised that the only thing to do was use compression hose.  Inquired as to what scab like areas are and if there is any treatment she can use for areas. Inquired as to if she should come to PCP or go back to [facility] for treatment.   Appointment scheduled for evaluation.

## 2019-01-04 NOTE — Telephone Encounter (Signed)
Pt not actively bleeding OV for tomorrow

## 2019-01-05 ENCOUNTER — Encounter: Payer: Self-pay | Admitting: Family Medicine

## 2019-01-05 ENCOUNTER — Ambulatory Visit (INDEPENDENT_AMBULATORY_CARE_PROVIDER_SITE_OTHER): Payer: Medicare HMO | Admitting: Family Medicine

## 2019-01-05 ENCOUNTER — Other Ambulatory Visit: Payer: Self-pay

## 2019-01-05 VITALS — BP 130/74 | HR 100 | Temp 98.4°F | Resp 14 | Ht 68.0 in | Wt 165.0 lb

## 2019-01-05 DIAGNOSIS — I87303 Chronic venous hypertension (idiopathic) without complications of bilateral lower extremity: Secondary | ICD-10-CM | POA: Diagnosis not present

## 2019-01-05 DIAGNOSIS — I83899 Varicose veins of unspecified lower extremities with other complications: Secondary | ICD-10-CM | POA: Diagnosis not present

## 2019-01-05 DIAGNOSIS — Z853 Personal history of malignant neoplasm of breast: Secondary | ICD-10-CM

## 2019-01-05 DIAGNOSIS — I495 Sick sinus syndrome: Secondary | ICD-10-CM

## 2019-01-05 DIAGNOSIS — N644 Mastodynia: Secondary | ICD-10-CM | POA: Diagnosis not present

## 2019-01-05 LAB — COMPREHENSIVE METABOLIC PANEL
AG Ratio: 1.4 (calc) (ref 1.0–2.5)
ALT: 19 U/L (ref 6–29)
AST: 19 U/L (ref 10–35)
Albumin: 3.9 g/dL (ref 3.6–5.1)
Alkaline phosphatase (APISO): 106 U/L (ref 37–153)
BUN: 12 mg/dL (ref 7–25)
CO2: 30 mmol/L (ref 20–32)
Calcium: 9.5 mg/dL (ref 8.6–10.4)
Chloride: 102 mmol/L (ref 98–110)
Creat: 0.61 mg/dL (ref 0.60–0.93)
Globulin: 2.7 g/dL (calc) (ref 1.9–3.7)
Glucose, Bld: 96 mg/dL (ref 65–99)
Potassium: 5.1 mmol/L (ref 3.5–5.3)
Sodium: 140 mmol/L (ref 135–146)
Total Bilirubin: 0.3 mg/dL (ref 0.2–1.2)
Total Protein: 6.6 g/dL (ref 6.1–8.1)

## 2019-01-05 LAB — CBC WITH DIFFERENTIAL/PLATELET
Absolute Monocytes: 637 cells/uL (ref 200–950)
Basophils Absolute: 57 cells/uL (ref 0–200)
Basophils Relative: 0.6 %
Eosinophils Absolute: 219 cells/uL (ref 15–500)
Eosinophils Relative: 2.3 %
HCT: 33.8 % — ABNORMAL LOW (ref 35.0–45.0)
Hemoglobin: 11.4 g/dL — ABNORMAL LOW (ref 11.7–15.5)
Lymphs Abs: 3325 cells/uL (ref 850–3900)
MCH: 30.2 pg (ref 27.0–33.0)
MCHC: 33.7 g/dL (ref 32.0–36.0)
MCV: 89.7 fL (ref 80.0–100.0)
MPV: 9.7 fL (ref 7.5–12.5)
Monocytes Relative: 6.7 %
Neutro Abs: 5263 cells/uL (ref 1500–7800)
Neutrophils Relative %: 55.4 %
Platelets: 403 10*3/uL — ABNORMAL HIGH (ref 140–400)
RBC: 3.77 10*6/uL — ABNORMAL LOW (ref 3.80–5.10)
RDW: 14 % (ref 11.0–15.0)
Total Lymphocyte: 35 %
WBC: 9.5 10*3/uL (ref 3.8–10.8)

## 2019-01-05 MED ORDER — DICLOFENAC SODIUM 75 MG PO TBEC: 75.0000 mg | DELAYED_RELEASE_TABLET | Freq: Two times a day (BID) | ORAL | 2 refills | Status: DC

## 2019-01-05 NOTE — Patient Instructions (Addendum)
Wear the compression hose Try magnesium 250-400mg  once a day  We will call with lab results Diagnostic mammogram to be done  F/U schedule your wellness visit

## 2019-01-05 NOTE — Progress Notes (Signed)
   Subjective:    Patient ID: [name], female    DOB: Nov 29, 1945, 73 y.o.   MRN: [id number]  Patient presents for Varicose Veins (had episode on Sunday of bleeding on R ankle )   Sunday AM had a bath, after she got out, Right leg a varicose veins spontaneously started bleeding- squriting under pressure   Last night felt cramping in her legs, feels it in her entire leg at times   Left breast pain - end of visit states she has pain in left breast where she had some radiation in the past, it has worsened past few months, no lump felt   She did have vascular UE in 2018 and evalation by told compression hose   Review Of Systems:  GEN- denies fatigue, fever, weight loss,weakness, recent illness HEENT- denies eye drainage, change in vision, nasal discharge, CVS- denies chest pain, palpitations RESP- denies SOB, cough, wheeze ABD- denies N/V, change in stools, abd pain GU- denies dysuria, hematuria, dribbling, incontinence MSK- denies joint pain, +muscle aches, injury Neuro- denies headache, dizziness, syncope, seizure activity       Objective:    BP 130/74   Pulse 100   Temp 98.4 F (36.9 C) (Oral)   Resp 14   Ht 5\' 8"  (1.727 m)   Wt 165 lb (74.8 kg)   SpO2 95%   BMI 25.09 kg/m  GEN- NAD, alert and oriented x3 HEENT- PERRL, EOMI, non injected sclera, pink conjunctiva, MMM, oropharynx clear Neck- Supple, no thyromegaly Breast- normal symmetry,Left breast, no nodule or lump, TTP lower outer quadrant  Nodes- no axillary nodes CVS- RRR, no murmur RESP-CTAB ABD-NABS,soft,NT,ND EXT- No edema, multiple spider veins varicose veins, pinpoint scab over previous bleeding vessel Pulses- Radial, DP palpated        Assessment & Plan:      Problem List Items Addressed This Visit      Unprioritized   Bleeding from varicose vein    Spontaneous bleed now resolved, also on ASA Use compression hose No other intervention needed, no swelling today   This is likely  contributing to her muscle aches at times in legs  can try low dose magnesium  Check electrolytes      Relevant Orders   CBC with Differential/Platelet (Completed)   History of left breast cancer   Relevant Orders   MM Digital Diagnostic Unilat L   US BREAST COMPLETE UNI LEFT INC AXILLA   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 Any transcriptional errors that result from this process are unintentional.

## 2019-01-06 ENCOUNTER — Encounter: Payer: Self-pay | Admitting: Family Medicine

## 2019-01-06 NOTE — Assessment & Plan Note (Addendum)
Spontaneous bleed now resolved, also on ASA Use compression hose No other intervention needed, no swelling today   This is likely contributing to her muscle aches at times in legs  can try low dose magnesium  Check electrolytes

## 2019-01-06 NOTE — Assessment & Plan Note (Signed)
Controlled with atenolol

## 2019-01-14 DIAGNOSIS — H353222 Exudative age-related macular degeneration, left eye, with inactive choroidal neovascularization: Secondary | ICD-10-CM | POA: Diagnosis not present

## 2019-01-14 DIAGNOSIS — H3581 Retinal edema: Secondary | ICD-10-CM | POA: Diagnosis not present

## 2019-01-14 DIAGNOSIS — H35372 Puckering of macula, left eye: Secondary | ICD-10-CM | POA: Diagnosis not present

## 2019-01-14 DIAGNOSIS — H353211 Exudative age-related macular degeneration, right eye, with active choroidal neovascularization: Secondary | ICD-10-CM | POA: Diagnosis not present

## 2019-01-18 ENCOUNTER — Ambulatory Visit (HOSPITAL_COMMUNITY)
Admission: RE | Admit: 2019-01-18 | Discharge: 2019-01-18 | Disposition: A | Discharge status: Home / Self Care | Payer: Medicare HMO | Source: Ambulatory Visit | Attending: Family Medicine | Admitting: Family Medicine

## 2019-01-18 ENCOUNTER — Other Ambulatory Visit: Payer: Self-pay

## 2019-01-18 ENCOUNTER — Other Ambulatory Visit: Payer: Self-pay | Admitting: Family Medicine

## 2019-01-18 DIAGNOSIS — Z853 Personal history of malignant neoplasm of breast: Secondary | ICD-10-CM

## 2019-01-18 DIAGNOSIS — N644 Mastodynia: Secondary | ICD-10-CM

## 2019-01-18 DIAGNOSIS — R922 Inconclusive mammogram: Secondary | ICD-10-CM | POA: Diagnosis not present

## 2019-01-28 ENCOUNTER — Other Ambulatory Visit: Payer: Medicare HMO

## 2019-01-28 ENCOUNTER — Other Ambulatory Visit: Payer: Self-pay

## 2019-01-28 DIAGNOSIS — I83899 Varicose veins of unspecified lower extremities with other complications: Secondary | ICD-10-CM | POA: Diagnosis not present

## 2019-01-28 DIAGNOSIS — D649 Anemia, unspecified: Secondary | ICD-10-CM

## 2019-01-28 LAB — CBC WITH DIFFERENTIAL/PLATELET
Absolute Monocytes: 510 cells/uL (ref 200–950)
Basophils Absolute: 57 cells/uL (ref 0–200)
Basophils Relative: 0.7 %
Eosinophils Absolute: 162 cells/uL (ref 15–500)
Eosinophils Relative: 2 %
HCT: 35.5 % (ref 35.0–45.0)
Hemoglobin: 11.3 g/dL — ABNORMAL LOW (ref 11.7–15.5)
Lymphs Abs: 2867 cells/uL (ref 850–3900)
MCH: 29.3 pg (ref 27.0–33.0)
MCHC: 31.8 g/dL — ABNORMAL LOW (ref 32.0–36.0)
MCV: 92 fL (ref 80.0–100.0)
MPV: 9.6 fL (ref 7.5–12.5)
Monocytes Relative: 6.3 %
Neutro Abs: 4504 cells/uL (ref 1500–7800)
Neutrophils Relative %: 55.6 %
Platelets: 413 10*3/uL — ABNORMAL HIGH (ref 140–400)
RBC: 3.86 10*6/uL (ref 3.80–5.10)
RDW: 14.5 % (ref 11.0–15.0)
Total Lymphocyte: 35.4 %
WBC: 8.1 10*3/uL (ref 3.8–10.8)

## 2019-02-08 ENCOUNTER — Telehealth: Payer: Self-pay | Admitting: *Deleted

## 2019-02-08 MED ORDER — CEPHALEXIN 500 MG PO CAPS: 500.0000 mg | ORAL_CAPSULE | Freq: Three times a day (TID) | ORAL | 0 refills | Status: DC

## 2019-02-08 NOTE — Telephone Encounter (Signed)
Received call from patient.   Reports that she thinks she has a bladder infection. Reports dysuria and lower abd cramping.   MD made aware and new orders obtained for Keflex 500mg  PO TID x5 days. Advised if Sx do not resolve or worsen, she will need to be seen.

## 2019-03-07 ENCOUNTER — Encounter: Payer: Self-pay | Admitting: Family Medicine

## 2019-03-07 ENCOUNTER — Other Ambulatory Visit: Payer: Self-pay

## 2019-03-07 ENCOUNTER — Ambulatory Visit (INDEPENDENT_AMBULATORY_CARE_PROVIDER_SITE_OTHER): Payer: Medicare HMO | Admitting: Family Medicine

## 2019-03-07 VITALS — BP 128/64 | HR 90 | Temp 98.6°F | Resp 16 | Ht 68.0 in | Wt 168.0 lb

## 2019-03-07 DIAGNOSIS — Z87440 Personal history of urinary (tract) infections: Secondary | ICD-10-CM | POA: Diagnosis not present

## 2019-03-07 LAB — URINALYSIS, ROUTINE W REFLEX MICROSCOPIC
Bilirubin Urine: NEGATIVE
Glucose, UA: NEGATIVE
Hgb urine dipstick: NEGATIVE
Ketones, ur: NEGATIVE
Leukocytes,Ua: NEGATIVE
Nitrite: NEGATIVE
Protein, ur: NEGATIVE
Specific Gravity, Urine: 1.025 (ref 1.001–1.03)
pH: 5.5 (ref 5.0–8.0)

## 2019-03-07 MED ORDER — ATORVASTATIN CALCIUM 10 MG PO TABS: 10.0000 mg | ORAL_TABLET | Freq: Every day | ORAL | 3 refills | Status: DC

## 2019-03-07 MED ORDER — METOPROLOL SUCCINATE ER 25 MG PO TB24: 12.5000 mg | ORAL_TABLET | Freq: Every day | ORAL | 3 refills | Status: DC

## 2019-03-07 MED ORDER — DICLOFENAC SODIUM 75 MG PO TBEC: 75.0000 mg | DELAYED_RELEASE_TABLET | Freq: Two times a day (BID) | ORAL | 2 refills | Status: DC

## 2019-03-07 NOTE — Progress Notes (Signed)
   Subjective:    Patient ID: [name], female    DOB: 1945/10/06, 73 y.o.   MRN: [id number]  Patient presents for Follow-up (UTI- would like to make sure infection is gone)   Pt here to f/u recent UTI. She called in 1 month ago, with UTI symptoms. Given Keflex 500mg  TID. She completed antibiotics, No further symptoms.  No current symptoms he just wanted to come in to have urine rechecked. Need her medication changed over to a mail order     Review Of Systems:  GEN- denies fatigue, fever, weight loss,weakness, recent illness HEENT- denies eye drainage, change in vision, nasal discharge, CVS- denies chest pain, palpitations RESP- denies SOB, cough, wheeze ABD- denies N/V, change in stools, abd pain GU- denies dysuria, hematuria, dribbling, incontinence MSK- denies joint pain, muscle aches, injury Neuro- denies headache, dizziness, syncope, seizure activity       Objective:    BP 128/64   Pulse 90   Temp 98.6 F (37 C) (Oral)   Resp 16   Ht 5\' 8"  (1.727 m)   Wt 168 lb (76.2 kg)   SpO2 98%   BMI 25.54 kg/m  GEN- NAD, alert and oriented x3 HEENT- PERRL, EOMI, non injected sclera, pink conjunctiva, MMM, oropharynx clear CVS- RRR, no murmur RESP-CTAB ABD-NABS,soft,NT,ND, no CVA tenderness EXT- No edema Pulses- Radial,2+        Assessment & Plan:      Problem List Items Addressed This Visit    None    Visit Diagnoses    Hx: UTI (urinary tract infection)    -  Primary   Recent UTI is resolved, no current symptoms   Relevant Orders   Urinalysis, Routine w reflex microscopic (Completed)      Note: This dictation was prepared with Dragon dictation along with smaller phrase technology. Any transcriptional errors that result from this process are unintentional.

## 2019-03-07 NOTE — Patient Instructions (Signed)
F/U as previous 

## 2019-03-22 DIAGNOSIS — H26491 Other secondary cataract, right eye: Secondary | ICD-10-CM | POA: Diagnosis not present

## 2019-03-22 DIAGNOSIS — H353211 Exudative age-related macular degeneration, right eye, with active choroidal neovascularization: Secondary | ICD-10-CM | POA: Diagnosis not present

## 2019-03-22 DIAGNOSIS — H35372 Puckering of macula, left eye: Secondary | ICD-10-CM | POA: Diagnosis not present

## 2019-03-22 DIAGNOSIS — H353222 Exudative age-related macular degeneration, left eye, with inactive choroidal neovascularization: Secondary | ICD-10-CM | POA: Diagnosis not present

## 2019-05-10 ENCOUNTER — Encounter: Payer: Medicare HMO | Admitting: Family Medicine

## 2019-06-01 ENCOUNTER — Telehealth: Payer: Self-pay | Admitting: *Deleted

## 2019-06-01 DIAGNOSIS — R209 Unspecified disturbances of skin sensation: Secondary | ICD-10-CM

## 2019-06-01 DIAGNOSIS — G912 (Idiopathic) normal pressure hydrocephalus: Secondary | ICD-10-CM

## 2019-06-01 NOTE — Telephone Encounter (Signed)
Received call from patient.   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

## 2019-06-01 NOTE — Telephone Encounter (Signed)
Okay to place referral

## 2019-06-02 NOTE — Telephone Encounter (Signed)
Referral orders placed

## 2019-06-02 NOTE — Telephone Encounter (Signed)
Call placed to patient and patient made aware per VM.  

## 2019-06-14 DIAGNOSIS — H353211 Exudative age-related macular degeneration, right eye, with active choroidal neovascularization: Secondary | ICD-10-CM | POA: Diagnosis not present

## 2019-06-14 DIAGNOSIS — H353222 Exudative age-related macular degeneration, left eye, with inactive choroidal neovascularization: Secondary | ICD-10-CM | POA: Diagnosis not present

## 2019-06-14 DIAGNOSIS — H35372 Puckering of macula, left eye: Secondary | ICD-10-CM | POA: Diagnosis not present

## 2019-07-25 ENCOUNTER — Other Ambulatory Visit (HOSPITAL_COMMUNITY): Payer: Self-pay | Admitting: Family Medicine

## 2019-07-25 DIAGNOSIS — Z1231 Encounter for screening mammogram for malignant neoplasm of breast: Secondary | ICD-10-CM

## 2019-07-29 ENCOUNTER — Ambulatory Visit (HOSPITAL_COMMUNITY): Payer: Medicare HMO

## 2019-08-01 DIAGNOSIS — G912 (Idiopathic) normal pressure hydrocephalus: Secondary | ICD-10-CM | POA: Diagnosis not present

## 2019-08-01 DIAGNOSIS — R269 Unspecified abnormalities of gait and mobility: Secondary | ICD-10-CM | POA: Diagnosis not present

## 2019-08-15 ENCOUNTER — Encounter: Payer: Self-pay | Admitting: Orthopedic Surgery

## 2019-08-15 ENCOUNTER — Ambulatory Visit (INDEPENDENT_AMBULATORY_CARE_PROVIDER_SITE_OTHER): Payer: Medicare HMO | Admitting: Orthopedic Surgery

## 2019-08-15 ENCOUNTER — Ambulatory Visit: Payer: Medicare HMO

## 2019-08-15 ENCOUNTER — Other Ambulatory Visit: Payer: Self-pay

## 2019-08-15 VITALS — BP 134/77 | HR 98 | Ht 68.0 in | Wt 160.0 lb

## 2019-08-15 DIAGNOSIS — M545 Low back pain, unspecified: Secondary | ICD-10-CM

## 2019-08-15 NOTE — Patient Instructions (Signed)
You have been scheduled for an MRI scan  We will call your insurance company to do a precertification to get the MRI covered  You will receive a phone call regarding the date of the scan  Dr Aline Brochure will call you with the results

## 2019-08-15 NOTE — Progress Notes (Signed)
Chief Complaint  Patient presents with  . Back Pain    long duration / getting worse. has had previous surgery at [facility]    74 yo female status post multiple surgeries lumbar spine at [facility] presents with complaints of lower back pain bilateral leg pain bilateral knee pain giving way of the legs, currently seeing neurologist for "fluid on the brain"  Previously seen here for her knees x-rays did not show arthritis  Most of her pain is in the front of the knee but does not bother her if she sitting  She does note that she cannot stand at the sink for long periods of time  Review of Systems  Neurological: Positive for focal weakness. Negative for tingling and sensory change.   Past Medical History:  Diagnosis Date  . Arthritis   . Breast cancer ([place])    left breast/lumpectomy/chemo/rad 2003  . COPD (chronic obstructive pulmonary disease) ([place])   . DDD (degenerative disc disease), lumbar 08/31/2017  . Diverticulitis   . GERD (gastroesophageal reflux disease)   . Hemoptysis   . Hyperlipidemia   . Iron deficiency   . Macular degeneration    bilateral  . Personal history of radiation therapy 2003  . Pulmonary embolism ([place])   . Tachycardia     BP 134/77   Pulse 98   Ht 5\' 8"  (1.727 m)   Wt 160 lb (72.6 kg)   BMI 24.33 kg/m   Normal development grooming and hygiene  Right knee no swelling no tenderness normal skin normal range of motion normal stability normal strength normal muscle tone  Left knee skin is normal no tenderness no swelling range of motion is intact strength and stability are normal  Straight leg raises are negative on the right slightly positive on the left at 40 degrees  Reflexes 1+ at the ankle bilaterally 2+ at the knee bilaterally  Sensory exam normal bilaterally  Lumbar spine is tender in the midline and on the right and left  Lumbar spine x-ray shows scoliosis and L4-5 and L5-S1 degenerative disease  Encounter Diagnosis  Name Primary?  .  Lumbar pain Yes     MRI LUMBAR patient weak falling leg weakness giving way

## 2019-08-23 DIAGNOSIS — G912 (Idiopathic) normal pressure hydrocephalus: Secondary | ICD-10-CM | POA: Diagnosis not present

## 2019-08-23 DIAGNOSIS — R269 Unspecified abnormalities of gait and mobility: Secondary | ICD-10-CM | POA: Diagnosis not present

## 2019-08-23 DIAGNOSIS — G9389 Other specified disorders of brain: Secondary | ICD-10-CM | POA: Diagnosis not present

## 2019-09-07 ENCOUNTER — Other Ambulatory Visit: Payer: Self-pay

## 2019-09-07 ENCOUNTER — Ambulatory Visit (HOSPITAL_COMMUNITY)
Admission: RE | Admit: 2019-09-07 | Discharge: 2019-09-07 | Disposition: A | Discharge status: Home / Self Care | Payer: Medicare HMO | Source: Ambulatory Visit | Attending: Orthopedic Surgery | Admitting: Orthopedic Surgery

## 2019-09-07 DIAGNOSIS — M545 Low back pain, unspecified: Secondary | ICD-10-CM

## 2019-09-12 ENCOUNTER — Ambulatory Visit (HOSPITAL_COMMUNITY)
Admission: RE | Admit: 2019-09-12 | Discharge: 2019-09-12 | Disposition: A | Discharge status: Home / Self Care | Payer: Medicare HMO | Source: Ambulatory Visit | Attending: Family Medicine | Admitting: Family Medicine

## 2019-09-12 ENCOUNTER — Other Ambulatory Visit: Payer: Self-pay

## 2019-09-12 DIAGNOSIS — Z1231 Encounter for screening mammogram for malignant neoplasm of breast: Secondary | ICD-10-CM | POA: Diagnosis not present

## 2019-09-13 ENCOUNTER — Telehealth: Payer: Self-pay | Admitting: Radiology

## 2019-09-13 ENCOUNTER — Ambulatory Visit (HOSPITAL_COMMUNITY): Payer: Medicare HMO

## 2019-09-13 DIAGNOSIS — M545 Low back pain, unspecified: Secondary | ICD-10-CM

## 2019-09-13 NOTE — Telephone Encounter (Signed)
-----   Message from Carole Civil, MD sent at 09/08/2019  4:54 PM EDT ----- I gave this patient her resultsShe is currently being seen at [facility] at the neuro or neurology clinic Dr. Cari Caraway and PA is Micah Noel   Can you make a referral to this clinic regarding her lumbar spine she does not want to go to [facility] had a bad experience

## 2019-09-19 DIAGNOSIS — G912 (Idiopathic) normal pressure hydrocephalus: Secondary | ICD-10-CM | POA: Diagnosis not present

## 2019-09-19 DIAGNOSIS — Z8673 Personal history of transient ischemic attack (TIA), and cerebral infarction without residual deficits: Secondary | ICD-10-CM | POA: Diagnosis not present

## 2019-09-19 DIAGNOSIS — Z87891 Personal history of nicotine dependence: Secondary | ICD-10-CM | POA: Diagnosis not present

## 2019-09-19 DIAGNOSIS — K219 Gastro-esophageal reflux disease without esophagitis: Secondary | ICD-10-CM | POA: Diagnosis not present

## 2019-09-19 DIAGNOSIS — Z0489 Encounter for examination and observation for other specified reasons: Secondary | ICD-10-CM | POA: Diagnosis not present

## 2019-09-19 DIAGNOSIS — Z20822 Contact with and (suspected) exposure to covid-19: Secondary | ICD-10-CM | POA: Diagnosis not present

## 2019-09-19 DIAGNOSIS — E785 Hyperlipidemia, unspecified: Secondary | ICD-10-CM | POA: Diagnosis not present

## 2019-09-19 DIAGNOSIS — Z853 Personal history of malignant neoplasm of breast: Secondary | ICD-10-CM | POA: Diagnosis not present

## 2019-09-19 DIAGNOSIS — R42 Dizziness and giddiness: Secondary | ICD-10-CM | POA: Diagnosis not present

## 2019-09-19 DIAGNOSIS — M8580 Other specified disorders of bone density and structure, unspecified site: Secondary | ICD-10-CM | POA: Diagnosis not present

## 2019-09-19 DIAGNOSIS — R32 Unspecified urinary incontinence: Secondary | ICD-10-CM | POA: Diagnosis not present

## 2019-09-23 DIAGNOSIS — H353222 Exudative age-related macular degeneration, left eye, with inactive choroidal neovascularization: Secondary | ICD-10-CM | POA: Diagnosis not present

## 2019-09-23 DIAGNOSIS — H35372 Puckering of macula, left eye: Secondary | ICD-10-CM | POA: Diagnosis not present

## 2019-09-23 DIAGNOSIS — H353211 Exudative age-related macular degeneration, right eye, with active choroidal neovascularization: Secondary | ICD-10-CM | POA: Diagnosis not present

## 2019-09-26 DIAGNOSIS — M79661 Pain in right lower leg: Secondary | ICD-10-CM | POA: Diagnosis not present

## 2019-09-26 DIAGNOSIS — R42 Dizziness and giddiness: Secondary | ICD-10-CM | POA: Diagnosis not present

## 2019-09-26 DIAGNOSIS — R4189 Other symptoms and signs involving cognitive functions and awareness: Secondary | ICD-10-CM | POA: Diagnosis not present

## 2019-09-26 DIAGNOSIS — M8580 Other specified disorders of bone density and structure, unspecified site: Secondary | ICD-10-CM | POA: Diagnosis not present

## 2019-09-26 DIAGNOSIS — K219 Gastro-esophageal reflux disease without esophagitis: Secondary | ICD-10-CM | POA: Diagnosis not present

## 2019-09-26 DIAGNOSIS — Z20822 Contact with and (suspected) exposure to covid-19: Secondary | ICD-10-CM | POA: Diagnosis not present

## 2019-09-26 DIAGNOSIS — R2681 Unsteadiness on feet: Secondary | ICD-10-CM | POA: Diagnosis not present

## 2019-09-26 DIAGNOSIS — Z79899 Other long term (current) drug therapy: Secondary | ICD-10-CM | POA: Diagnosis not present

## 2019-09-26 DIAGNOSIS — G912 (Idiopathic) normal pressure hydrocephalus: Secondary | ICD-10-CM | POA: Diagnosis not present

## 2019-09-26 DIAGNOSIS — R32 Unspecified urinary incontinence: Secondary | ICD-10-CM | POA: Diagnosis not present

## 2019-09-26 DIAGNOSIS — E785 Hyperlipidemia, unspecified: Secondary | ICD-10-CM | POA: Diagnosis not present

## 2019-09-26 DIAGNOSIS — M79662 Pain in left lower leg: Secondary | ICD-10-CM | POA: Diagnosis not present

## 2019-09-26 DIAGNOSIS — Z8673 Personal history of transient ischemic attack (TIA), and cerebral infarction without residual deficits: Secondary | ICD-10-CM | POA: Diagnosis not present

## 2019-09-26 DIAGNOSIS — Z87891 Personal history of nicotine dependence: Secondary | ICD-10-CM | POA: Diagnosis not present

## 2019-10-05 DIAGNOSIS — M5441 Lumbago with sciatica, right side: Secondary | ICD-10-CM | POA: Diagnosis not present

## 2019-10-05 DIAGNOSIS — M8580 Other specified disorders of bone density and structure, unspecified site: Secondary | ICD-10-CM | POA: Diagnosis not present

## 2019-10-05 DIAGNOSIS — H539 Unspecified visual disturbance: Secondary | ICD-10-CM | POA: Diagnosis not present

## 2019-10-05 DIAGNOSIS — H409 Unspecified glaucoma: Secondary | ICD-10-CM | POA: Diagnosis not present

## 2019-10-05 DIAGNOSIS — M5442 Lumbago with sciatica, left side: Secondary | ICD-10-CM | POA: Diagnosis not present

## 2019-10-05 DIAGNOSIS — M1990 Unspecified osteoarthritis, unspecified site: Secondary | ICD-10-CM | POA: Diagnosis not present

## 2019-10-05 DIAGNOSIS — D649 Anemia, unspecified: Secondary | ICD-10-CM | POA: Diagnosis not present

## 2019-10-05 DIAGNOSIS — K219 Gastro-esophageal reflux disease without esophagitis: Secondary | ICD-10-CM | POA: Diagnosis not present

## 2019-10-05 DIAGNOSIS — E785 Hyperlipidemia, unspecified: Secondary | ICD-10-CM | POA: Diagnosis not present

## 2019-10-06 ENCOUNTER — Other Ambulatory Visit: Payer: Self-pay

## 2019-10-06 ENCOUNTER — Ambulatory Visit (INDEPENDENT_AMBULATORY_CARE_PROVIDER_SITE_OTHER): Payer: Medicare HMO | Admitting: Orthopedic Surgery

## 2019-10-06 ENCOUNTER — Encounter: Payer: Self-pay | Admitting: Orthopedic Surgery

## 2019-10-06 VITALS — BP 139/84 | HR 94 | Temp 97.2°F | Ht 68.0 in | Wt 159.0 lb

## 2019-10-06 DIAGNOSIS — M48062 Spinal stenosis, lumbar region with neurogenic claudication: Secondary | ICD-10-CM | POA: Diagnosis not present

## 2019-10-06 NOTE — Progress Notes (Signed)
Chief Complaint  Patient presents with  . Back Pain    Wants to talk about MRI results and get medical advice.   [name] comes in today to talk about her MRI  She was being seen by neurology at [facility] and we felt that she could be seen by neurosurgery there however she wants to be seen in [place]  She did not understand the explanation of her MRI on the phone in 1 talk about that as well  At [facility] she had a lumbar puncture to relieve pressure in her brain and they tried 4 times and on the fourth time were successful.  I showed her a model of the lumbar spine pictures from the Internet explained her lumbar spine condition and it took 15 minutes of total time

## 2019-10-07 ENCOUNTER — Telehealth: Payer: Self-pay | Admitting: *Deleted

## 2019-10-07 NOTE — Telephone Encounter (Signed)
Order faxed.

## 2019-10-07 NOTE — Telephone Encounter (Signed)
Received call from [place], [facility] PT with [facility] [phone]~ telephone.   Reports that PT evaluation was completed. Requested VO to extend [facility] PT services 1x 1 week, 2x 4 weeks, and 1x 4 weeks for strengthening, balance, mobility and fall prevention. VO given.   Also requested order to be faxed to [place] [phone]~ fax for OT evaluation for fall assessment. Ok to order?

## 2019-10-07 NOTE — Telephone Encounter (Signed)
Okay to order assessment from OT  Okay to extend PT services

## 2019-10-11 DIAGNOSIS — M5441 Lumbago with sciatica, right side: Secondary | ICD-10-CM | POA: Diagnosis not present

## 2019-10-11 DIAGNOSIS — H409 Unspecified glaucoma: Secondary | ICD-10-CM | POA: Diagnosis not present

## 2019-10-11 DIAGNOSIS — E785 Hyperlipidemia, unspecified: Secondary | ICD-10-CM | POA: Diagnosis not present

## 2019-10-11 DIAGNOSIS — M1990 Unspecified osteoarthritis, unspecified site: Secondary | ICD-10-CM | POA: Diagnosis not present

## 2019-10-11 DIAGNOSIS — K219 Gastro-esophageal reflux disease without esophagitis: Secondary | ICD-10-CM | POA: Diagnosis not present

## 2019-10-11 DIAGNOSIS — M8580 Other specified disorders of bone density and structure, unspecified site: Secondary | ICD-10-CM | POA: Diagnosis not present

## 2019-10-11 DIAGNOSIS — H539 Unspecified visual disturbance: Secondary | ICD-10-CM | POA: Diagnosis not present

## 2019-10-11 DIAGNOSIS — M5442 Lumbago with sciatica, left side: Secondary | ICD-10-CM | POA: Diagnosis not present

## 2019-10-11 DIAGNOSIS — D649 Anemia, unspecified: Secondary | ICD-10-CM | POA: Diagnosis not present

## 2019-10-12 DIAGNOSIS — M1990 Unspecified osteoarthritis, unspecified site: Secondary | ICD-10-CM | POA: Diagnosis not present

## 2019-10-12 DIAGNOSIS — M8580 Other specified disorders of bone density and structure, unspecified site: Secondary | ICD-10-CM | POA: Diagnosis not present

## 2019-10-12 DIAGNOSIS — H539 Unspecified visual disturbance: Secondary | ICD-10-CM | POA: Diagnosis not present

## 2019-10-12 DIAGNOSIS — M5441 Lumbago with sciatica, right side: Secondary | ICD-10-CM | POA: Diagnosis not present

## 2019-10-12 DIAGNOSIS — E785 Hyperlipidemia, unspecified: Secondary | ICD-10-CM | POA: Diagnosis not present

## 2019-10-12 DIAGNOSIS — D649 Anemia, unspecified: Secondary | ICD-10-CM | POA: Diagnosis not present

## 2019-10-12 DIAGNOSIS — M5442 Lumbago with sciatica, left side: Secondary | ICD-10-CM | POA: Diagnosis not present

## 2019-10-12 DIAGNOSIS — K219 Gastro-esophageal reflux disease without esophagitis: Secondary | ICD-10-CM | POA: Diagnosis not present

## 2019-10-12 DIAGNOSIS — H409 Unspecified glaucoma: Secondary | ICD-10-CM | POA: Diagnosis not present

## 2019-10-17 IMAGING — MG DIGITAL SCREENING BILATERAL MAMMOGRAM WITH TOMO AND CAD
8 series · 9 of 24 positions shown · non-contrast
Comparison: Previous exam(s).

CLINICAL DATA: Screening.

EXAM:
DIGITAL SCREENING BILATERAL MAMMOGRAM WITH TOMO AND CAD

[R CC synth-2D]
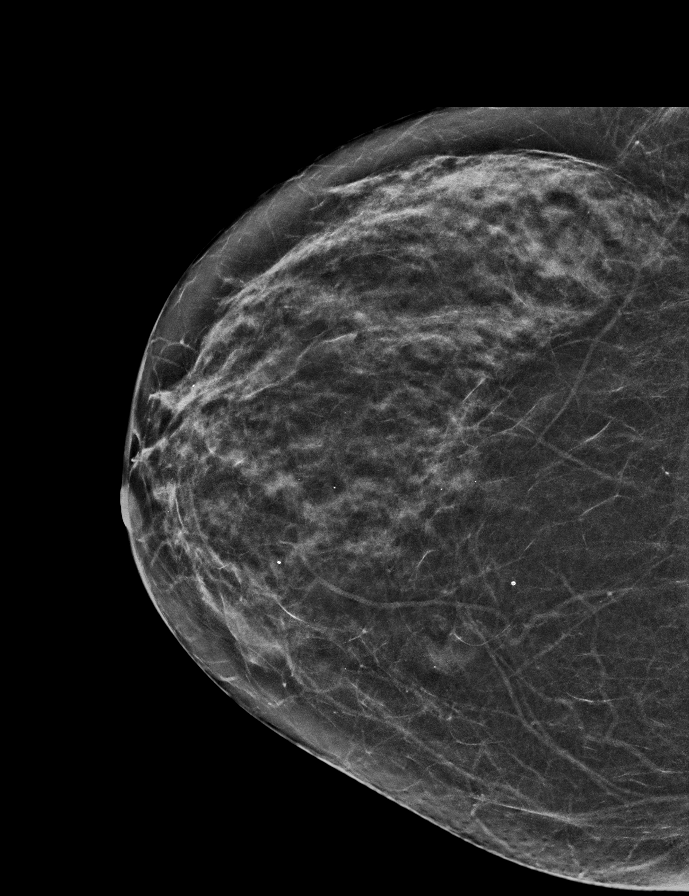

[R MLO synth-2D]
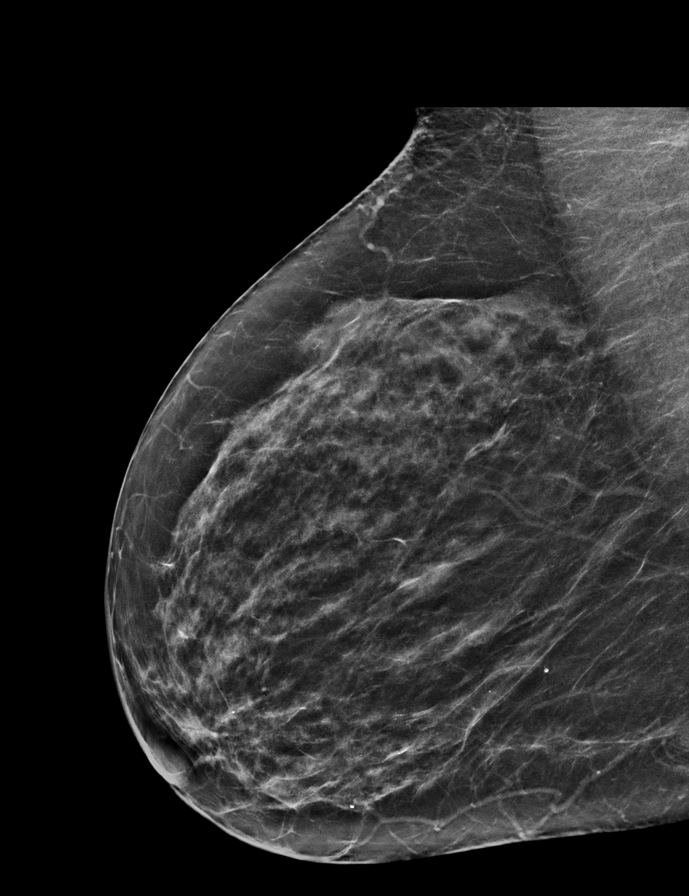

[L MLO synth-2D]
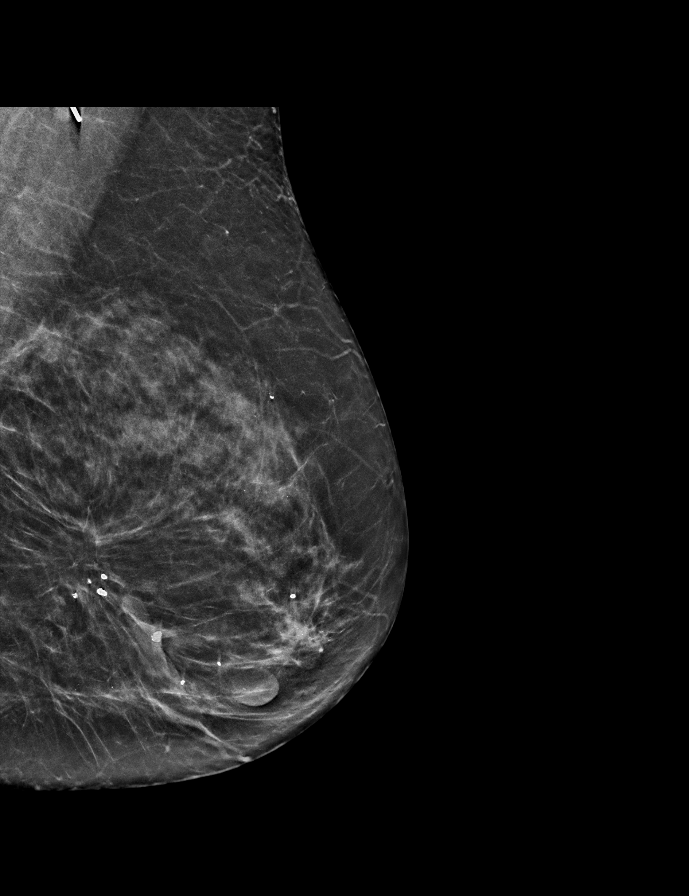

[L CC synth-2D]
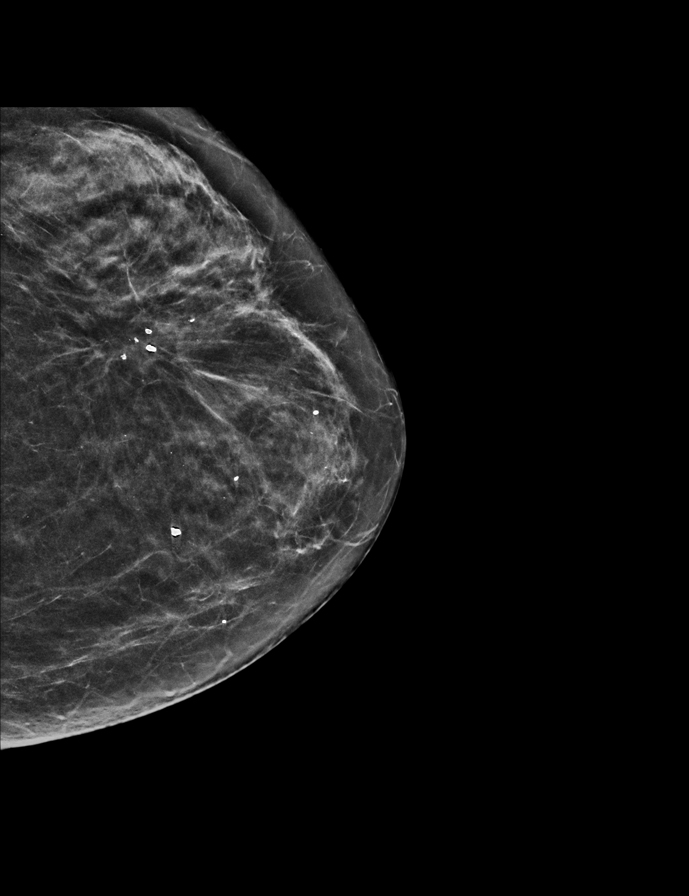

[L MLO tomo · 2 of 57 frames shown]
[frame 19/57]
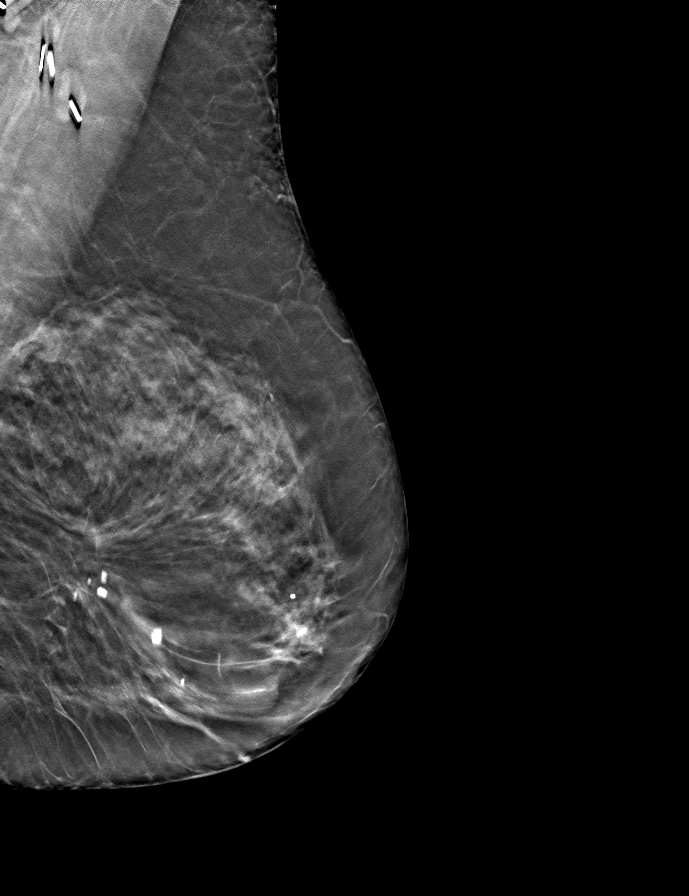
[frame 29/57]
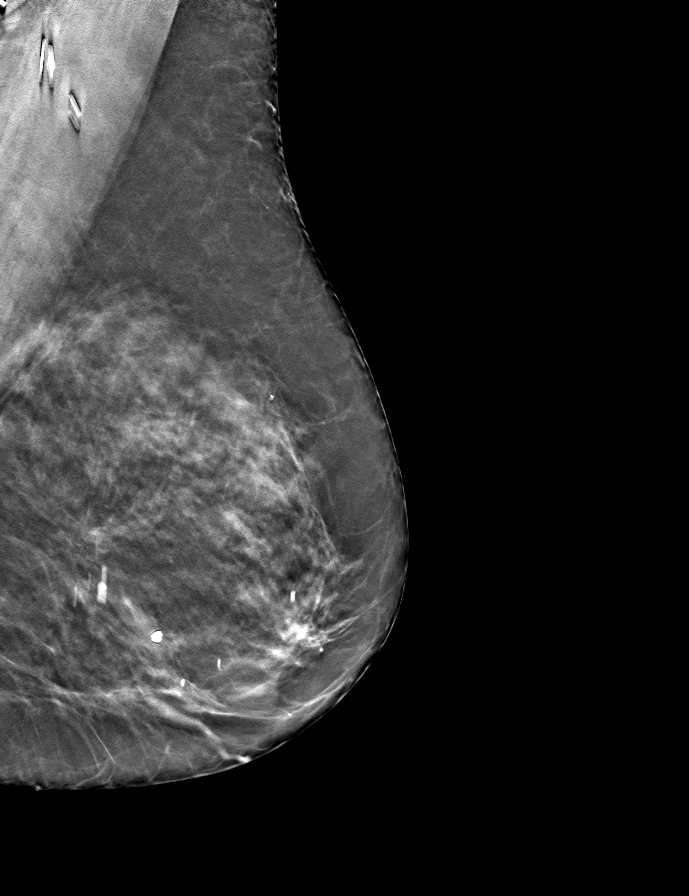

[R MLO tomo · tomo slice 31/61.0]
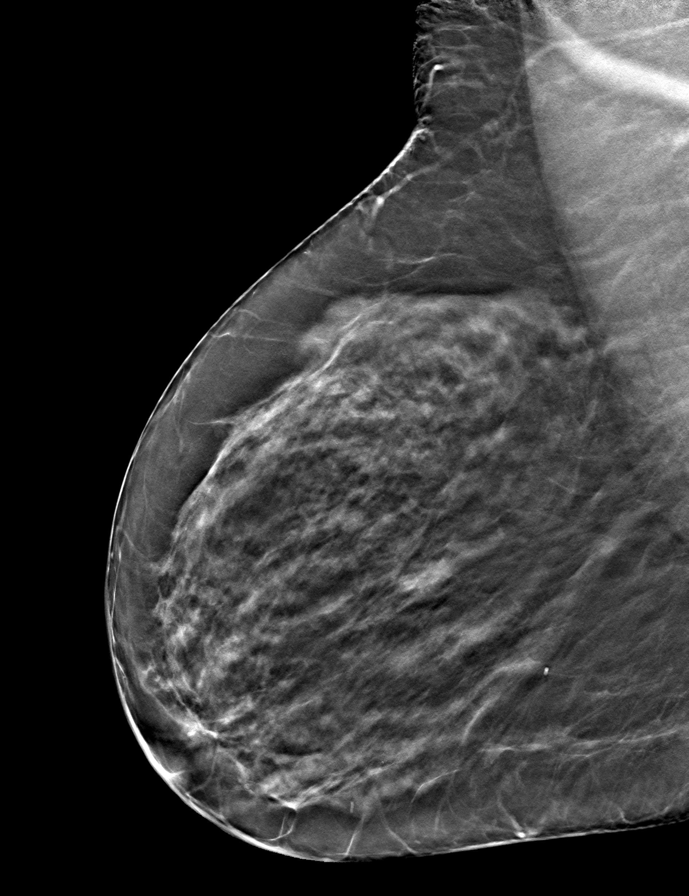

[L CC tomo · tomo slice 27/53.0]
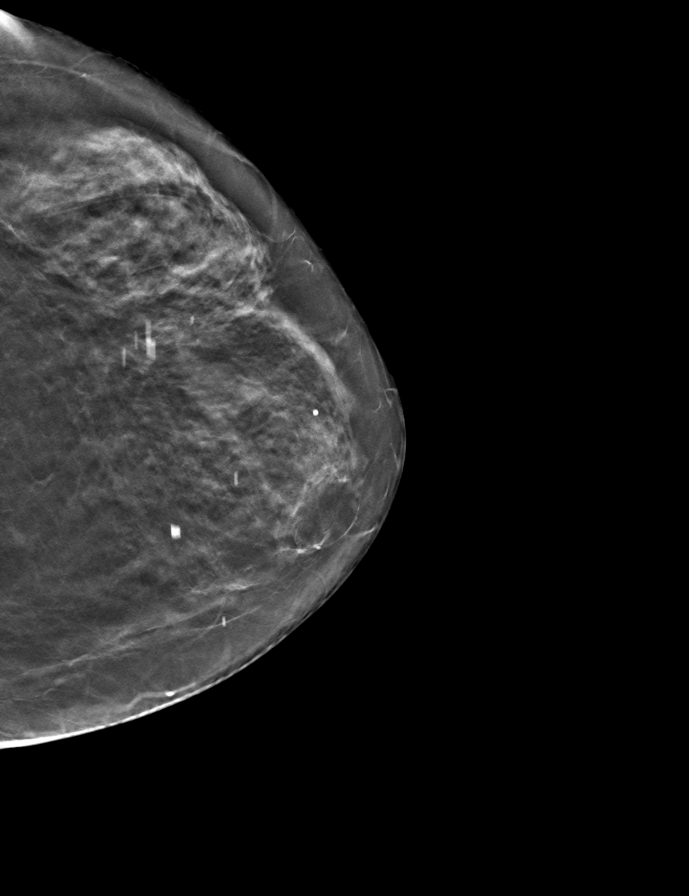

[R CC tomo · tomo slice 27/54.0]
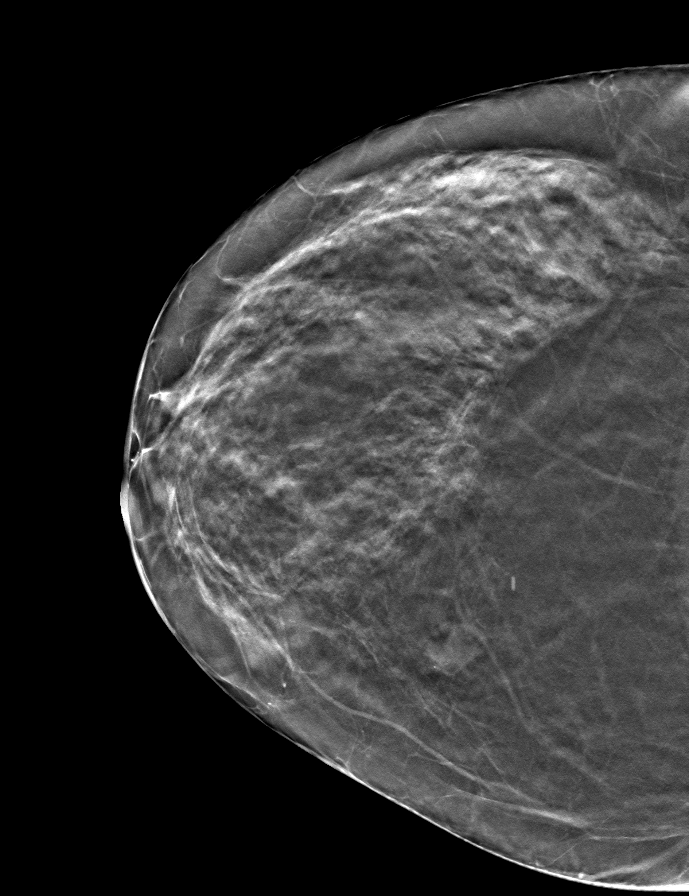

[9 of 24 positions shown; findings below may reference images not displayed]

ACR Breast Density Category c: The breast tissue is heterogeneously
dense, which may obscure small masses.
FINDINGS: There are no findings suspicious for malignancy. Images were
processed with CAD.
IMPRESSION: No mammographic evidence of malignancy. A result letter of this
screening mammogram will be mailed directly to the patient.

RECOMMENDATION:
Screening mammogram in one year. (Code:[id number])

BI-RADS CATEGORY  1: Negative.

## 2019-10-24 DIAGNOSIS — I10 Essential (primary) hypertension: Secondary | ICD-10-CM | POA: Diagnosis not present

## 2019-10-24 DIAGNOSIS — M545 Low back pain: Secondary | ICD-10-CM | POA: Diagnosis not present

## 2019-10-24 DIAGNOSIS — G8929 Other chronic pain: Secondary | ICD-10-CM | POA: Diagnosis not present

## 2019-11-01 ENCOUNTER — Other Ambulatory Visit: Payer: Self-pay

## 2019-11-01 ENCOUNTER — Ambulatory Visit (INDEPENDENT_AMBULATORY_CARE_PROVIDER_SITE_OTHER): Payer: Medicare HMO | Admitting: Family Medicine

## 2019-11-01 ENCOUNTER — Encounter: Payer: Self-pay | Admitting: Family Medicine

## 2019-11-01 VITALS — BP 128/72 | HR 76 | Temp 98.1°F | Resp 14 | Ht 68.0 in | Wt 161.0 lb

## 2019-11-01 DIAGNOSIS — H409 Unspecified glaucoma: Secondary | ICD-10-CM

## 2019-11-01 DIAGNOSIS — I87303 Chronic venous hypertension (idiopathic) without complications of bilateral lower extremity: Secondary | ICD-10-CM

## 2019-11-01 DIAGNOSIS — Z853 Personal history of malignant neoplasm of breast: Secondary | ICD-10-CM

## 2019-11-01 DIAGNOSIS — I495 Sick sinus syndrome: Secondary | ICD-10-CM

## 2019-11-01 DIAGNOSIS — K219 Gastro-esophageal reflux disease without esophagitis: Secondary | ICD-10-CM

## 2019-11-01 DIAGNOSIS — M8589 Other specified disorders of bone density and structure, multiple sites: Secondary | ICD-10-CM | POA: Diagnosis not present

## 2019-11-01 DIAGNOSIS — Z8673 Personal history of transient ischemic attack (TIA), and cerebral infarction without residual deficits: Secondary | ICD-10-CM

## 2019-11-01 DIAGNOSIS — Z1159 Encounter for screening for other viral diseases: Secondary | ICD-10-CM

## 2019-11-01 DIAGNOSIS — M159 Polyosteoarthritis, unspecified: Secondary | ICD-10-CM

## 2019-11-01 DIAGNOSIS — F17200 Nicotine dependence, unspecified, uncomplicated: Secondary | ICD-10-CM

## 2019-11-01 DIAGNOSIS — R42 Dizziness and giddiness: Secondary | ICD-10-CM

## 2019-11-01 DIAGNOSIS — E782 Mixed hyperlipidemia: Secondary | ICD-10-CM | POA: Diagnosis not present

## 2019-11-01 DIAGNOSIS — Z0001 Encounter for general adult medical examination with abnormal findings: Secondary | ICD-10-CM | POA: Diagnosis not present

## 2019-11-01 DIAGNOSIS — Z0000 Encounter for general adult medical examination without abnormal findings: Secondary | ICD-10-CM

## 2019-11-01 MED ORDER — MAGNESIUM OXIDE 400 MG PO CAPS: ORAL_CAPSULE | ORAL | 0 refills | Status: DC

## 2019-11-01 MED ORDER — METOPROLOL SUCCINATE ER 25 MG PO TB24: 12.5000 mg | ORAL_TABLET | Freq: Every day | ORAL | 3 refills | Status: DC

## 2019-11-01 NOTE — Patient Instructions (Addendum)
Schedule Bone Density  We will call with lab results F/U 6 months

## 2019-11-01 NOTE — Progress Notes (Signed)
Subjective:   Patient presents for Medicare Annual/Subsequent preventive examination.    Patient here for Medicare wellness visit.  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  She had been having some dizzy spells for over 2 years, I ordered MRI in December 2019 and she has been seeing multiple specialist since then.  She had lumbar puncture done in 2020 and afterwards her symptoms resolved but then returned.  She was then sent today [place] [facility] for evaluation for shunt.   She was seen by neurology at [facility] in 2021 and had   Repeat MRI done in referral back to neurosurgery for a second lumbar puncture. Her repeat MRI showed cerebral ventriculomegaly which was nonspecific.  Neurosurgeon in March discussed a lumbar drain trial before placing a shunt. On 419 she was admitted to [facility] for lumbar drain trial for her ongoing dizziness and ventriculomegaly And drain trial unsuccessful therefore she not meet criteria for shunt.  During her hospitalization for the drain trial she had bilateral calf pain right worse than left ultrasounds were done given her history of DVT to rule out clot and this was negative.  She was cleared to go home with physical therapy and  Occupational Therapy and follow-up with neurology  Review Past Medical/Family/Social: per EMR    Risk Factors  Current exercise habits: walks very little due to back pain Dietary issues discussed: no major concerns   Cardiac risk factors: Hyperlipidemia, tachy-brady syndrome   Depression Screen  (Note: if answer to either of the following is "Yes", a more complete depression screening is indicated)  Over the past two weeks, have you felt down, depressed or hopeless? No Over the past two weeks, have you felt little interest or pleasure in doing things? No Have you lost interest or pleasure in daily life? No Do you often feel hopeless? No Do you cry easily over simple problems? No   Activities of Daily Living  In your present state of health, do you have any difficulty performing the following activities?:  Driving? No  Managing money? No  Feeding yourself? No  Getting from bed to chair? No  Climbing a flight of stairs?yes  Preparing food and eating?: No  Bathing or showering? No  Getting dressed: No  Getting to the toilet? No  Using the toilet:No  Moving around from place to place: Yes  In the past year have you fallen or had a near fall?:Yes   Are you sexually active? No  Do you have more than one partner? No   Hearing Difficulties: No  Do you often ask people to speak up or repeat themselves? No  Do you experience ringing or noises in your ears? No Do you have difficulty understanding soft or whispered voices? No  Do you feel that you  have a problem with memory? No Do you often misplace items? No  Do you feel safe at home? Yes  Cognitive Testing  Alert? Yes Normal Appearance?Yes  Oriented to person? Yes Place? Yes  Time? Yes  Recall of three objects? Yes  Can perform simple calculations? Yes  Displays appropriate judgment?Yes  Can read the correct time from a watch face?Yes   List the Names of Other Physician/Practitioners you currently use:   Orthopedics- Dr.  Aline Brochure  Neurosurgery- Dr. Izora Ribas / [facility]  Ophthalmology- Dr. Baird Cancer  Neurology    Screening Tests / Date Colonoscopy   utd                  Shingles  UTD per pt  Mammogram  UTD  Influenza Vaccine overdue - w Tetanus/tdap  COVID-19-  Declines  PNA- Due for Booster Pneumovax 23 - pt declines  Bone Density- Due last 2018 Osteopenia   ROS: GEN- denies fatigue, fever, weight loss,weakness, recent illness HEENT- denies eye drainage, change in vision, nasal discharge, CVS- denies chest pain, palpitations RESP- denies SOB, cough, wheeze ABD- denies N/V, change in stools, abd pain GU- denies dysuria, hematuria, dribbling, incontinence MSK- denies joint pain, muscle aches, injury Neuro- denies headache, dizziness, syncope, seizure activity  PHYSICAL: vitals reviewed  GEN- NAD, alert and oriented x3 HEENT- PERRL, EOMI, non injected sclera, pink conjunctiva, MMM, oropharynx clear Neck- Supple, no thryomegaly CVS- RRR, no murmur RESP-CTAB ABD-NABS,soft,NT,ND EXT- No edema Pulses- Radial, DP- 2+    Assessment:    Annual wellness medicare exam   Plan:    During the course of the visit the patient was educated and counseled about appropriate screening and preventive services including:   CAGE/DEPRESSION screen negative  High fall risk- using Cane and currently In PT    DDD/Spinal stenosis- taking tylenol three times a day, planning for steroid injections   takes diclofeanc once a day after biggest meal   H/O Stroke with hyperlipidemia - on ASA  325mg / lipitor  Tachybrady- taking metoprolol 12.5mg  once a day   GERD- chronic PPI, low dose, needed to control symptoms  Chronic dizzy spells, in PT, reviewed recent neurology note, no changes  Osteopenia patient to schedule bone density continue calcium and vitamin D  Tobacco use   Labs obtained         Diet review for nutrition referral? Yes ____ Not Indicated __x__  Patient Instructions  (the written plan) was given to the patient.  Medicare Attestation  I have personally reviewed:  The patient's medical and social history  Their use of alcohol, tobacco or illicit drugs  Their current medications and supplements  The patient's functional ability including ADLs,fall risks, home safety risks, cognitive, and hearing and visual impairment  Diet and physical activities  Evidence for depression or mood disorders  The patient's weight, height, BMI, and visual acuity have been recorded in the chart. I have made referrals, counseling, and provided education to the patient based on review of the above and I have provided the patient with a written personalized care plan for preventive services.

## 2019-11-02 ENCOUNTER — Telehealth: Payer: Self-pay | Admitting: *Deleted

## 2019-11-02 LAB — CBC WITH DIFFERENTIAL/PLATELET
Absolute Monocytes: 602 cells/uL (ref 200–950)
Basophils Absolute: 66 cells/uL (ref 0–200)
Basophils Relative: 0.7 %
Eosinophils Absolute: 132 cells/uL (ref 15–500)
Eosinophils Relative: 1.4 %
HCT: 35.5 % (ref 35.0–45.0)
Hemoglobin: 11.2 g/dL — ABNORMAL LOW (ref 11.7–15.5)
Lymphs Abs: 2312 cells/uL (ref 850–3900)
MCH: 28.2 pg (ref 27.0–33.0)
MCHC: 31.5 g/dL — ABNORMAL LOW (ref 32.0–36.0)
MCV: 89.4 fL (ref 80.0–100.0)
MPV: 9.4 fL (ref 7.5–12.5)
Monocytes Relative: 6.4 %
Neutro Abs: 6289 cells/uL (ref 1500–7800)
Neutrophils Relative %: 66.9 %
Platelets: 444 10*3/uL — ABNORMAL HIGH (ref 140–400)
RBC: 3.97 10*6/uL (ref 3.80–5.10)
RDW: 14.4 % (ref 11.0–15.0)
Total Lymphocyte: 24.6 %
WBC: 9.4 10*3/uL (ref 3.8–10.8)

## 2019-11-02 LAB — COMPREHENSIVE METABOLIC PANEL
AG Ratio: 1.4 (calc) (ref 1.0–2.5)
ALT: 23 U/L (ref 6–29)
AST: 19 U/L (ref 10–35)
Albumin: 4 g/dL (ref 3.6–5.1)
Alkaline phosphatase (APISO): 97 U/L (ref 37–153)
BUN/Creatinine Ratio: 21 (calc) (ref 6–22)
BUN: 12 mg/dL (ref 7–25)
CO2: 29 mmol/L (ref 20–32)
Calcium: 10 mg/dL (ref 8.6–10.4)
Chloride: 104 mmol/L (ref 98–110)
Creat: 0.58 mg/dL — ABNORMAL LOW (ref 0.60–0.93)
Globulin: 2.8 g/dL (calc) (ref 1.9–3.7)
Glucose, Bld: 105 mg/dL — ABNORMAL HIGH (ref 65–99)
Potassium: 5.2 mmol/L (ref 3.5–5.3)
Sodium: 141 mmol/L (ref 135–146)
Total Bilirubin: 0.5 mg/dL (ref 0.2–1.2)
Total Protein: 6.8 g/dL (ref 6.1–8.1)

## 2019-11-02 LAB — LIPID PANEL
Cholesterol: 186 mg/dL (ref <200)
HDL: 59 mg/dL (ref >=50)
LDL Cholesterol (Calc): 103 mg/dL (calc) — ABNORMAL HIGH
Non-HDL Cholesterol (Calc): 127 mg/dL (calc) (ref <130)
Total CHOL/HDL Ratio: 3.2 (calc) (ref <5.0)
Triglycerides: 138 mg/dL (ref <150)

## 2019-11-02 LAB — HEPATITIS C ANTIBODY
Hepatitis C Ab: NONREACTIVE
SIGNAL TO CUT-OFF: 0.03 (ref <1.00)

## 2019-11-02 NOTE — Telephone Encounter (Signed)
Okay to send chantix  

## 2019-11-02 NOTE — Telephone Encounter (Signed)
Received call from patient.   Requested prescription for Chantix.   Ok to order?

## 2019-11-03 DIAGNOSIS — M5441 Lumbago with sciatica, right side: Secondary | ICD-10-CM | POA: Diagnosis not present

## 2019-11-03 DIAGNOSIS — H539 Unspecified visual disturbance: Secondary | ICD-10-CM | POA: Diagnosis not present

## 2019-11-03 DIAGNOSIS — K219 Gastro-esophageal reflux disease without esophagitis: Secondary | ICD-10-CM | POA: Diagnosis not present

## 2019-11-03 DIAGNOSIS — H409 Unspecified glaucoma: Secondary | ICD-10-CM | POA: Diagnosis not present

## 2019-11-03 DIAGNOSIS — D649 Anemia, unspecified: Secondary | ICD-10-CM | POA: Diagnosis not present

## 2019-11-03 DIAGNOSIS — E785 Hyperlipidemia, unspecified: Secondary | ICD-10-CM | POA: Diagnosis not present

## 2019-11-03 DIAGNOSIS — M5442 Lumbago with sciatica, left side: Secondary | ICD-10-CM | POA: Diagnosis not present

## 2019-11-03 DIAGNOSIS — M1990 Unspecified osteoarthritis, unspecified site: Secondary | ICD-10-CM | POA: Diagnosis not present

## 2019-11-03 DIAGNOSIS — M8580 Other specified disorders of bone density and structure, unspecified site: Secondary | ICD-10-CM | POA: Diagnosis not present

## 2019-11-03 MED ORDER — CHANTIX STARTING MONTH PAK 0.5 MG X 11 & 1 MG X 42 PO TABS
ORAL_TABLET | ORAL | 0 refills | Status: DC
Start: 2019-11-03 — End: 2021-04-02

## 2019-11-03 NOTE — Telephone Encounter (Signed)
Prescription sent to pharmacy.

## 2019-11-04 DIAGNOSIS — K219 Gastro-esophageal reflux disease without esophagitis: Secondary | ICD-10-CM | POA: Diagnosis not present

## 2019-11-04 DIAGNOSIS — H539 Unspecified visual disturbance: Secondary | ICD-10-CM | POA: Diagnosis not present

## 2019-11-04 DIAGNOSIS — H409 Unspecified glaucoma: Secondary | ICD-10-CM | POA: Diagnosis not present

## 2019-11-04 DIAGNOSIS — E785 Hyperlipidemia, unspecified: Secondary | ICD-10-CM | POA: Diagnosis not present

## 2019-11-04 DIAGNOSIS — M1990 Unspecified osteoarthritis, unspecified site: Secondary | ICD-10-CM | POA: Diagnosis not present

## 2019-11-04 DIAGNOSIS — D649 Anemia, unspecified: Secondary | ICD-10-CM | POA: Diagnosis not present

## 2019-11-04 DIAGNOSIS — M5441 Lumbago with sciatica, right side: Secondary | ICD-10-CM | POA: Diagnosis not present

## 2019-11-04 DIAGNOSIS — M8580 Other specified disorders of bone density and structure, unspecified site: Secondary | ICD-10-CM | POA: Diagnosis not present

## 2019-11-04 DIAGNOSIS — M5442 Lumbago with sciatica, left side: Secondary | ICD-10-CM | POA: Diagnosis not present

## 2019-11-08 DIAGNOSIS — M5442 Lumbago with sciatica, left side: Secondary | ICD-10-CM | POA: Diagnosis not present

## 2019-11-08 DIAGNOSIS — M5441 Lumbago with sciatica, right side: Secondary | ICD-10-CM | POA: Diagnosis not present

## 2019-11-08 DIAGNOSIS — H409 Unspecified glaucoma: Secondary | ICD-10-CM | POA: Diagnosis not present

## 2019-11-08 DIAGNOSIS — M1990 Unspecified osteoarthritis, unspecified site: Secondary | ICD-10-CM | POA: Diagnosis not present

## 2019-11-08 DIAGNOSIS — M8580 Other specified disorders of bone density and structure, unspecified site: Secondary | ICD-10-CM | POA: Diagnosis not present

## 2019-11-08 DIAGNOSIS — E785 Hyperlipidemia, unspecified: Secondary | ICD-10-CM | POA: Diagnosis not present

## 2019-11-08 DIAGNOSIS — D649 Anemia, unspecified: Secondary | ICD-10-CM | POA: Diagnosis not present

## 2019-11-08 DIAGNOSIS — H539 Unspecified visual disturbance: Secondary | ICD-10-CM | POA: Diagnosis not present

## 2019-11-08 DIAGNOSIS — K219 Gastro-esophageal reflux disease without esophagitis: Secondary | ICD-10-CM | POA: Diagnosis not present

## 2019-11-09 ENCOUNTER — Encounter: Payer: Medicare HMO | Admitting: Family Medicine

## 2019-11-10 ENCOUNTER — Other Ambulatory Visit: Payer: Self-pay

## 2019-11-10 ENCOUNTER — Other Ambulatory Visit: Payer: Self-pay | Admitting: Nurse Practitioner

## 2019-11-10 ENCOUNTER — Ambulatory Visit (HOSPITAL_COMMUNITY)
Admission: RE | Admit: 2019-11-10 | Discharge: 2019-11-10 | Disposition: A | Discharge status: Home / Self Care | Payer: Medicare HMO | Source: Ambulatory Visit | Attending: Nurse Practitioner | Admitting: Nurse Practitioner

## 2019-11-10 ENCOUNTER — Other Ambulatory Visit: Payer: Self-pay | Admitting: Family Medicine

## 2019-11-10 ENCOUNTER — Ambulatory Visit (INDEPENDENT_AMBULATORY_CARE_PROVIDER_SITE_OTHER): Payer: Medicare HMO | Admitting: Nurse Practitioner

## 2019-11-10 ENCOUNTER — Encounter: Payer: Self-pay | Admitting: Nurse Practitioner

## 2019-11-10 VITALS — BP 120/62 | HR 98 | Temp 98.0°F | Resp 18 | Wt 160.0 lb

## 2019-11-10 DIAGNOSIS — M7989 Other specified soft tissue disorders: Secondary | ICD-10-CM

## 2019-11-10 DIAGNOSIS — I87303 Chronic venous hypertension (idiopathic) without complications of bilateral lower extremity: Secondary | ICD-10-CM | POA: Diagnosis not present

## 2019-11-10 DIAGNOSIS — J449 Chronic obstructive pulmonary disease, unspecified: Secondary | ICD-10-CM | POA: Diagnosis not present

## 2019-11-10 NOTE — Progress Notes (Addendum)
Established Patient Office Visit  Subjective:  Patient ID: [name], female    DOB: Jan 08, 1946  Age: 74 y.o. MRN: [id number]  CC:  Chief Complaint  Patient presents with  . Leg Pain    L leg, started 06/09, redness, edema, no meds    HPI [name] is a 74 year old female presenting to clinic with swollen left lower extremity. The sxs started  Appr. 2 days ago and has increased. She reports h/o venous statis, PE. She is concerned of DVT. Other sxs included mild tingling/numbness to the affected foot/toes. The pt has tried no treatments.   Past Medical History:  Diagnosis Date  . Arthritis   . Breast cancer ([place])    left breast/lumpectomy/chemo/rad 2003  . COPD (chronic obstructive pulmonary disease) ([place])   . DDD (degenerative disc disease), lumbar 08/31/2017  . Diverticulitis   . GERD (gastroesophageal reflux disease)   . Hemoptysis   . Hyperlipidemia   . Iron deficiency   . Macular degeneration    bilateral  . Personal history of radiation therapy 2003  . Pulmonary embolism ([place])   . Tachycardia     Past Surgical History:  Procedure Laterality Date  . BACK SURGERY    . BREAST LUMPECTOMY    . COLONOSCOPY  01/28/2012   Procedure: COLONOSCOPY;  Surgeon: Rogene Houston, MD;  Location: [facility];  Service: Endoscopy;  Laterality: N/A;  1200  . COLONOSCOPY  01-2012  . ESOPHAGOGASTRODUODENOSCOPY N/A 07/01/2013   Procedure: ESOPHAGOGASTRODUODENOSCOPY (EGD);  Surgeon: Rogene Houston, MD;  Location: [facility];  Service: Endoscopy;  Laterality: N/A;  1:10  . LAPAROSCOPIC TUBAL LIGATION  1970  . TONSILLECTOMY     Patient was age 42    Family History  Problem Relation Age of Onset  . Heart disease Mother   . Alzheimer's disease Mother   . Heart disease Father   . Lung disease Father   . Healthy Son     Social History   Socioeconomic History  . Marital status: Married    Spouse name: Simona Huh  . Number of children: 1  . Years of education: some  college  . Highest education level: Not on file  Occupational History  . Not on file  Tobacco Use  . Smoking status: Current Every Day Smoker    Packs/day: 1.00    Years: 30.00    Pack years: 30.00    Types: Cigarettes  . Smokeless tobacco: Never Used  . Tobacco comment: 05/17/18 1- 1.5 PPD  Substance and Sexual Activity  . Alcohol use: No    Alcohol/week: 0.0 standard drinks  . Drug use: No  . Sexual activity: Not Currently  Other Topics Concern  . Not on file  Social History Narrative   Lives with husband   Caffeine- none   Social Determinants of Health   Financial Resource Strain:   . Difficulty of Paying Living Expenses:   Food Insecurity:   . Worried About Charity fundraiser in the Last Year:   . Arboriculturist in the Last Year:   Transportation Needs:   . Film/video editor (Medical):   Marland Kitchen Lack of Transportation (Non-Medical):   Physical Activity:   . Days of Exercise per Week:   . Minutes of Exercise per Session:   Stress:   . Feeling of Stress :   Social Connections:   . Frequency of Communication with Friends and Family:   . Frequency of Social  Gatherings with Friends and Family:   . Attends Religious Services:   . Active Member of Clubs or Organizations:   . Attends Archivist Meetings:   Marland Kitchen Marital Status:   Intimate Partner Violence:   . Fear of Current or Ex-Partner:   . Emotionally Abused:   Marland Kitchen Physically Abused:   . Sexually Abused:     Outpatient Medications Prior to Visit  Medication Sig Dispense Refill  . aspirin EC 325 MG tablet Take 1 tablet (325 mg total) by mouth daily. 30 tablet 0  . atorvastatin (LIPITOR) 10 MG tablet Take 1 tablet (10 mg total) by mouth daily. 90 tablet 3  . CALCIUM PO Take 1 tablet by mouth daily.    . Cholecalciferol (VITAMIN D PO) Take 1 tablet by mouth daily.    . diclofenac (VOLTAREN) 75 MG EC tablet Take 1 tablet (75 mg total) by mouth 2 (two) times daily. 180 tablet 2  . Magnesium Oxide 400 MG  CAPS Take 1 capsule daily  0  . metoprolol succinate (TOPROL-XL) 25 MG 24 hr tablet Take 0.5 tablets (12.5 mg total) by mouth daily. 45 tablet 3  . Multiple Vitamin (MULTIVITAMIN WITH MINERALS) TABS tablet Take 1 tablet by mouth daily.    . Multiple Vitamins-Minerals (PRESERVISION AREDS 2) CAPS Take by mouth 2 (two) times daily.     . Omega-3 Fatty Acids (FISH OIL) 1000 MG CAPS Take by mouth daily.    Marland Kitchen omeprazole (PRILOSEC) 10 MG capsule Take 10 mg by mouth daily.    . timolol (TIMOPTIC) 0.5 % ophthalmic solution Place 1 drop into the left eye daily.     . varenicline (CHANTIX STARTING MONTH PAK) 0.5 MG X 11 & 1 MG X 42 tablet Take 0.5 mg tablet by mouth 1x daily for 3 days, then increase to 0.5 mg tablet 2x daily for 4 days, then increase to 1 mg tablet 2x daily. 53 tablet 0   No facility-administered medications prior to visit.    Allergies  Allergen Reactions  . Sulfa Antibiotics Other (See Comments)    Childhood Allergy  05/17/18 patient denies    ROS Review of Systems  All other systems reviewed and are negative.     Objective:    Physical Exam Constitutional:      Appearance: Normal appearance. She is not ill-appearing.  HENT:     Head: Normocephalic.     Right Ear: External ear normal.     Left Ear: External ear normal.     Nose: Nose normal.  Eyes:     Extraocular Movements: Extraocular movements intact.     Conjunctiva/sclera: Conjunctivae normal.     Pupils: Pupils are equal, round, and reactive to light.  Cardiovascular:     Rate and Rhythm: Normal rate.  Musculoskeletal:     Cervical back: Normal range of motion and neck supple.     Left lower leg: Swelling and tenderness present. 1+ Edema present.     Left ankle: Swelling present. Abnormal pulse.     Left foot: Decreased capillary refill. Swelling and tenderness present. Abnormal pulse.       Legs:       Feet:  Feet:     Right foot:     Skin integrity: Skin integrity normal.     Left foot:     Skin  integrity: Skin integrity normal.  Neurological:     General: No focal deficit present.     Mental Status: She is alert and oriented  to person, place, and time.  Psychiatric:        Mood and Affect: Mood normal.        Behavior: Behavior normal.        Thought Content: Thought content normal.        Judgment: Judgment normal.     BP 120/62 (BP Location: Left Arm, Patient Position: Sitting, Cuff Size: Normal)   Pulse 98   Temp 98 F (36.7 C) (Temporal)   Resp 18   Wt 160 lb (72.6 kg)   SpO2 96%   BMI 24.33 kg/m  Wt Readings from Last 3 Encounters:  11/10/19 160 lb (72.6 kg)  11/01/19 161 lb (73 kg)  10/06/19 159 lb (72.1 kg)     There are no preventive care reminders to display for this patient.  There are no preventive care reminders to display for this patient.  Lab Results  Component Value Date   TSH 1.05 05/08/2017   Lab Results  Component Value Date   WBC 9.4 11/01/2019   HGB 11.2 (L) 11/01/2019   HCT 35.5 11/01/2019   MCV 89.4 11/01/2019   PLT 444 (H) 11/01/2019   Lab Results  Component Value Date   NA 141 11/01/2019   K 5.2 11/01/2019   CO2 29 11/01/2019   GLUCOSE 105 (H) 11/01/2019   BUN 12 11/01/2019   CREATININE 0.58 (L) 11/01/2019   BILITOT 0.5 11/01/2019   ALKPHOS 80 07/03/2016   AST 19 11/01/2019   ALT 23 11/01/2019   PROT 6.8 11/01/2019   ALBUMIN 4.0 07/03/2016   CALCIUM 10.0 11/01/2019   Lab Results  Component Value Date   CHOL 186 11/01/2019   Lab Results  Component Value Date   HDL 59 11/01/2019   Lab Results  Component Value Date   LDLCALC 103 (H) 11/01/2019   Lab Results  Component Value Date   TRIG 138 11/01/2019   Lab Results  Component Value Date   CHOLHDL 3.2 11/01/2019   No results found for: HGBA1C    Assessment & Plan:   Problem List Items Addressed This Visit      Cardiovascular and Mediastinum   Venous hypertension of both lower extremities    Other Visit Diagnoses    Swelling of left lower extremity     -  Primary   Relevant Orders   US Venous Img Lower Unilateral Left    Ultrasound stat at [facility] test is completed you will stay at the testing center until further directions.  DVT, Venous Thromboembolism prevention, edema print out provided, verbal education and precaution provided.   Do not massage  Go to ER or cal 911 for worsening symptoms during office closed hours   Follow-up: Return if symptoms worsen or fail to improve.    Annie Main, FNP

## 2019-11-10 NOTE — Progress Notes (Signed)
I want [facility] u/s I did put that in my note to do if not dvt but I guess they did not see and they didn't call us either. Would you call them. I will place order. I also need the pt called. Let imaging know they should call the on call pager with results.   You can tell pt that if the next u/s is negative she should elevated le above level of the heart, compression. Follow up for non resolving or worsening sxs. The on call may call her in lasix if worsens or if she needs.

## 2019-11-10 NOTE — Patient Instructions (Addendum)
Ultrasound stat at [facility] test is completed you will stay at the testing center until further directions.  DVT, Venous Thromboembolism prevention, edema print out provided, verbal education and precaution provided.   Do not massage  Go to ER or cal 911 for worsening symptoms during office closed hours

## 2019-11-11 ENCOUNTER — Ambulatory Visit (HOSPITAL_COMMUNITY)
Admission: RE | Admit: 2019-11-11 | Discharge: 2019-11-11 | Disposition: A | Discharge status: Home / Self Care | Payer: Medicare HMO | Source: Ambulatory Visit | Attending: Nurse Practitioner | Admitting: Nurse Practitioner

## 2019-11-11 DIAGNOSIS — M7989 Other specified soft tissue disorders: Secondary | ICD-10-CM | POA: Insufficient documentation

## 2019-11-11 DIAGNOSIS — R600 Localized edema: Secondary | ICD-10-CM | POA: Diagnosis not present

## 2019-11-14 ENCOUNTER — Telehealth: Payer: Self-pay | Admitting: *Deleted

## 2019-11-14 ENCOUNTER — Other Ambulatory Visit: Payer: Self-pay

## 2019-11-14 ENCOUNTER — Ambulatory Visit (HOSPITAL_COMMUNITY)
Admission: RE | Admit: 2019-11-14 | Discharge: 2019-11-14 | Disposition: A | Discharge status: Home / Self Care | Payer: Medicare HMO | Source: Ambulatory Visit | Attending: Family Medicine | Admitting: Family Medicine

## 2019-11-14 DIAGNOSIS — R609 Edema, unspecified: Secondary | ICD-10-CM

## 2019-11-14 DIAGNOSIS — M8589 Other specified disorders of bone density and structure, multiple sites: Secondary | ICD-10-CM

## 2019-11-14 DIAGNOSIS — I87303 Chronic venous hypertension (idiopathic) without complications of bilateral lower extremity: Secondary | ICD-10-CM

## 2019-11-14 DIAGNOSIS — M85851 Other specified disorders of bone density and structure, right thigh: Secondary | ICD-10-CM | POA: Diagnosis not present

## 2019-11-14 NOTE — Telephone Encounter (Signed)
Received call from patient.   Requested referral to vascular specialist for her edema to LLE.   Patient was seen by NP on 11/10/2019- [place] neg for DVT.   MD please advise.

## 2019-11-14 NOTE — Progress Notes (Signed)
Please check on pts sxs today. Her results are negative for DVT. She should elevate, compression stocking, I would consider diuretic depending on her sxs.

## 2019-11-14 NOTE — Telephone Encounter (Signed)
Vascular referral sent  She can wear compression hose which she should have at home as well

## 2019-11-14 NOTE — Telephone Encounter (Signed)
Call placed to patient and patient made aware per VM.  

## 2019-11-15 DIAGNOSIS — H539 Unspecified visual disturbance: Secondary | ICD-10-CM | POA: Diagnosis not present

## 2019-11-15 DIAGNOSIS — M1990 Unspecified osteoarthritis, unspecified site: Secondary | ICD-10-CM | POA: Diagnosis not present

## 2019-11-15 DIAGNOSIS — D649 Anemia, unspecified: Secondary | ICD-10-CM | POA: Diagnosis not present

## 2019-11-15 DIAGNOSIS — H409 Unspecified glaucoma: Secondary | ICD-10-CM | POA: Diagnosis not present

## 2019-11-15 DIAGNOSIS — M5442 Lumbago with sciatica, left side: Secondary | ICD-10-CM | POA: Diagnosis not present

## 2019-11-15 DIAGNOSIS — M5441 Lumbago with sciatica, right side: Secondary | ICD-10-CM | POA: Diagnosis not present

## 2019-11-15 DIAGNOSIS — K219 Gastro-esophageal reflux disease without esophagitis: Secondary | ICD-10-CM | POA: Diagnosis not present

## 2019-11-15 DIAGNOSIS — E785 Hyperlipidemia, unspecified: Secondary | ICD-10-CM | POA: Diagnosis not present

## 2019-11-15 DIAGNOSIS — M8580 Other specified disorders of bone density and structure, unspecified site: Secondary | ICD-10-CM | POA: Diagnosis not present

## 2019-11-22 DIAGNOSIS — H539 Unspecified visual disturbance: Secondary | ICD-10-CM | POA: Diagnosis not present

## 2019-11-22 DIAGNOSIS — D649 Anemia, unspecified: Secondary | ICD-10-CM | POA: Diagnosis not present

## 2019-11-22 DIAGNOSIS — E785 Hyperlipidemia, unspecified: Secondary | ICD-10-CM | POA: Diagnosis not present

## 2019-11-22 DIAGNOSIS — K219 Gastro-esophageal reflux disease without esophagitis: Secondary | ICD-10-CM | POA: Diagnosis not present

## 2019-11-22 DIAGNOSIS — M8580 Other specified disorders of bone density and structure, unspecified site: Secondary | ICD-10-CM | POA: Diagnosis not present

## 2019-11-22 DIAGNOSIS — M1990 Unspecified osteoarthritis, unspecified site: Secondary | ICD-10-CM | POA: Diagnosis not present

## 2019-11-22 DIAGNOSIS — M5441 Lumbago with sciatica, right side: Secondary | ICD-10-CM | POA: Diagnosis not present

## 2019-11-22 DIAGNOSIS — M5442 Lumbago with sciatica, left side: Secondary | ICD-10-CM | POA: Diagnosis not present

## 2019-11-22 DIAGNOSIS — H409 Unspecified glaucoma: Secondary | ICD-10-CM | POA: Diagnosis not present

## 2019-11-29 DIAGNOSIS — E785 Hyperlipidemia, unspecified: Secondary | ICD-10-CM | POA: Diagnosis not present

## 2019-11-29 DIAGNOSIS — M1990 Unspecified osteoarthritis, unspecified site: Secondary | ICD-10-CM | POA: Diagnosis not present

## 2019-11-29 DIAGNOSIS — D649 Anemia, unspecified: Secondary | ICD-10-CM | POA: Diagnosis not present

## 2019-11-29 DIAGNOSIS — M5442 Lumbago with sciatica, left side: Secondary | ICD-10-CM | POA: Diagnosis not present

## 2019-11-29 DIAGNOSIS — K219 Gastro-esophageal reflux disease without esophagitis: Secondary | ICD-10-CM | POA: Diagnosis not present

## 2019-11-29 DIAGNOSIS — M5441 Lumbago with sciatica, right side: Secondary | ICD-10-CM | POA: Diagnosis not present

## 2019-11-29 DIAGNOSIS — H409 Unspecified glaucoma: Secondary | ICD-10-CM | POA: Diagnosis not present

## 2019-11-29 DIAGNOSIS — H539 Unspecified visual disturbance: Secondary | ICD-10-CM | POA: Diagnosis not present

## 2019-11-29 DIAGNOSIS — M8580 Other specified disorders of bone density and structure, unspecified site: Secondary | ICD-10-CM | POA: Diagnosis not present

## 2019-11-30 ENCOUNTER — Other Ambulatory Visit: Payer: Self-pay | Admitting: *Deleted

## 2019-11-30 DIAGNOSIS — M7989 Other specified soft tissue disorders: Secondary | ICD-10-CM

## 2019-11-30 DIAGNOSIS — I87303 Chronic venous hypertension (idiopathic) without complications of bilateral lower extremity: Secondary | ICD-10-CM

## 2019-12-02 ENCOUNTER — Ambulatory Visit (INDEPENDENT_AMBULATORY_CARE_PROVIDER_SITE_OTHER): Payer: Medicare HMO | Admitting: Physician Assistant

## 2019-12-02 ENCOUNTER — Other Ambulatory Visit: Payer: Self-pay

## 2019-12-02 ENCOUNTER — Ambulatory Visit (HOSPITAL_COMMUNITY)
Admission: RE | Admit: 2019-12-02 | Discharge: 2019-12-02 | Disposition: A | Discharge status: Home / Self Care | Payer: Medicare HMO | Source: Ambulatory Visit | Attending: Vascular Surgery | Admitting: Vascular Surgery

## 2019-12-02 VITALS — BP 147/77 | HR 87 | Temp 96.7°F | Resp 20 | Ht 68.0 in | Wt 161.1 lb

## 2019-12-02 DIAGNOSIS — L03116 Cellulitis of left lower limb: Secondary | ICD-10-CM | POA: Diagnosis not present

## 2019-12-02 DIAGNOSIS — M7989 Other specified soft tissue disorders: Secondary | ICD-10-CM

## 2019-12-02 DIAGNOSIS — I87303 Chronic venous hypertension (idiopathic) without complications of bilateral lower extremity: Secondary | ICD-10-CM

## 2019-12-02 MED ORDER — DOXYCYCLINE HYCLATE 100 MG PO CAPS: 100.0000 mg | ORAL_CAPSULE | Freq: Two times a day (BID) | ORAL | 0 refills | Status: DC

## 2019-12-02 NOTE — Progress Notes (Signed)
Requested by:  Alycia Rossetti, [place],  [place] [address]  Reason for consultation: LLE swelling    History of Present Illness   [name] is a 74 y.o. ([birth year]) female who presents for evaluation of left lower extremity edema.  Patient has a past history of chronic lower extremity edema and DVT.  She has been treated in the past with Coumadin.  She was evaluated by her primary care physician who arranged duplex imaging of the IVC and iliac veins.  She denies knowledge of trauma or skin disruption to her lower leg.  She complains of tenderness.  She has  worn compression stockings in the past, but states she is having increasing difficulty placing her stockings due to low back pain currently left lower extremity pain.  She denies fever or chills.  Venous symptoms include: Edema, tenderness  onset/duration: 3 weeks Occupation: Retired Aggravating factors: none Alleviating factors: elevation Compression:  yes Helps:  yes Pain medications:  tylenol Previous vein procedures: none History of DVT:  yes  Past Medical History:  Diagnosis Date  . Arthritis   . Breast cancer ([place])    left breast/lumpectomy/chemo/rad 2003  . COPD (chronic obstructive pulmonary disease) ([place])   . DDD (degenerative disc disease), lumbar 08/31/2017  . Diverticulitis   . GERD (gastroesophageal reflux disease)   . Hemoptysis   . Hyperlipidemia   . Iron deficiency   . Macular degeneration    bilateral  . Personal history of radiation therapy 2003  . Pulmonary embolism ([place])   . Tachycardia     Past Surgical History:  Procedure Laterality Date  . BACK SURGERY    . BREAST LUMPECTOMY    . COLONOSCOPY  01/28/2012   Procedure: COLONOSCOPY;  Surgeon: Rogene Houston, MD;  [facility];  Service: Endoscopy;  Laterality: N/A;  1200  . COLONOSCOPY  01-2012  . ESOPHAGOGASTRODUODENOSCOPY N/A 07/01/2013   Procedure: ESOPHAGOGASTRODUODENOSCOPY (EGD);  Surgeon: Rogene Houston, MD;  [facility];  Service: Endoscopy;  Laterality: N/A;  1:10  . LAPAROSCOPIC TUBAL LIGATION  1970  . TONSILLECTOMY     Patient was age 89    Social History   Socioeconomic History  . Marital status: Married    Spouse name: Simona Huh  . Number of children: 1  . Years of education: some college  . Highest education level: Not on file  Occupational History  . Not on file  Tobacco Use  . Smoking status: Current Every Day Smoker    Packs/day: 1.00    Years: 30.00    Pack years: 30.00    Types: Cigarettes  . Smokeless tobacco: Never Used  . Tobacco comment: 05/17/18 1- 1.5 PPD  Substance and Sexual Activity  . Alcohol use: No    Alcohol/week: 0.0 standard drinks  . Drug use: No  . Sexual activity: Not Currently  Other Topics Concern  . Not on file  Social History Narrative   Lives with husband   Caffeine- none   Social Determinants of Health   Financial Resource Strain:   . Difficulty of Paying Living Expenses:   Food Insecurity:   . Worried About Charity fundraiser in the Last Year:   . Arboriculturist in the Last Year:   Transportation Needs:   . Film/video editor (Medical):   Marland Kitchen Lack of Transportation (Non-Medical):   Physical Activity:   . Days of Exercise per Week:   .  Minutes of Exercise per Session:   Stress:   . Feeling of Stress :   Social Connections:   . Frequency of Communication with Friends and Family:   . Frequency of Social Gatherings with Friends and Family:   . Attends Religious Services:   . Active Member of Clubs or Organizations:   . Attends Archivist Meetings:   Marland Kitchen Marital Status:   Intimate Partner Violence:   . Fear of Current or Ex-Partner:   . Emotionally Abused:   Marland Kitchen Physically Abused:   . Sexually Abused:     Family History  Problem Relation Age of Onset  . Heart disease Mother   . Alzheimer's disease Mother   . Heart disease Father   . Lung disease Father   . Healthy Son     Current  Outpatient Medications  Medication Sig Dispense Refill  . aspirin EC 325 MG tablet Take 1 tablet (325 mg total) by mouth daily. 30 tablet 0  . CALCIUM PO Take 1 tablet by mouth daily.    . Cholecalciferol (VITAMIN D PO) Take 1 tablet by mouth daily.    . diclofenac (VOLTAREN) 75 MG EC tablet Take 1 tablet (75 mg total) by mouth 2 (two) times daily. 180 tablet 2  . Magnesium Oxide 400 MG CAPS Take 1 capsule daily  0  . Multiple Vitamin (MULTIVITAMIN WITH MINERALS) TABS tablet Take 1 tablet by mouth daily.    . Multiple Vitamins-Minerals (PRESERVISION AREDS 2) CAPS Take by mouth 2 (two) times daily.     . Omega-3 Fatty Acids (FISH OIL) 1000 MG CAPS Take by mouth daily.    Marland Kitchen omeprazole (PRILOSEC) 10 MG capsule Take 10 mg by mouth daily.    . timolol (TIMOPTIC) 0.5 % ophthalmic solution Place 1 drop into the left eye daily.     . varenicline (CHANTIX STARTING MONTH PAK) 0.5 MG X 11 & 1 MG X 42 tablet Take 0.5 mg tablet by mouth 1x daily for 3 days, then increase to 0.5 mg tablet 2x daily for 4 days, then increase to 1 mg tablet 2x daily. 53 tablet 0   No current facility-administered medications for this visit.    Allergies  Allergen Reactions  . Sulfa Antibiotics Other (See Comments)    Childhood Allergy  05/17/18 patient denies    REVIEW OF SYSTEMS (negative unless checked):   Cardiac:  []  Chest pain or chest pressure? []  Shortness of breath upon activity? []  Shortness of breath when lying flat? []  Irregular heart rhythm?  Vascular:  []  Pain in calf, thigh, or hip brought on by walking? []  Pain in feet at night that wakes you up from your sleep? []  Blood clot in your veins? [x]  Leg swelling?  Pulmonary:  []  Oxygen at home? []  Productive cough? []  Wheezing?  Neurologic:  []  Sudden weakness in arms or legs? []  Sudden numbness in arms or legs? []  Sudden onset of difficult speaking or slurred speech? []  Temporary loss of vision in one eye? []  Problems with  dizziness?  Gastrointestinal:  []  Blood in stool? []  Vomited blood?  Genitourinary:  []  Burning when urinating? []  Blood in urine?  Psychiatric:  []  Major depression  Hematologic:  []  Bleeding problems? []  Problems with blood clotting?  Dermatologic:  []  Rashes or ulcers?  Constitutional:  []  Fever or chills?  Ear/Nose/Throat:  []  Change in hearing? []  Nose bleeds? []  Sore throat?  Musculoskeletal:  [x]  Back pain? []  Joint pain? []  Muscle pain?   Physical  Examination     Vitals:   12/02/19 1456  BP: (!) 147/77  Pulse: 87  Resp: 20  Temp: (!) 96.7 F (35.9 C)  SpO2: 97%   General:  WDWN in NAD; vital signs documented above Gait: steady, unaided HENT: WNL, normocephalic Pulmonary: normal non-labored breathing , without Rales, rhonchi,  wheezing Cardiac: regular HR, without  Murmurs without carotid bruits Abdomen: soft, NT, no masses Skin: without rashes Vascular Exam/Pulses:  Right Left  Radial 2+ (normal) 2+ (normal)  Ulnar Not eval Not eval  Femoral 2+ (normal) 2+ (normal)  Popliteal Not palp Not palp  DP Not palp Not palp  PT Not palp 2+ (normal)   Extremities: without varicose veins, with reticular veins, with edema, without stasis pigmentation, without lipodermatosclerosis, without ulcers.  She has 1-2+ pitting edema of the left lower extremity from ankle to knee.  She is mildly tender to palpation.  There is mild erythema and increased warmth. Musculoskeletal: no muscle wasting or atrophy  Neurologic: A&O X 3;  No focal weakness or paresthesias are detected Psychiatric:  The pt has Normal affect.       Non-invasive Vascular Imaging   LLE Venous Insufficiency Duplex (12/02/2019):   LLE:  no DVT and SVT,   yes GSV reflux at mid calf,   GSV diameter 0.18-0.57  yes SSV reflux at pop fossa and prox calf  No deep venous reflux  Retroperitoneal U/S dated: 11/11/2019 IMPRESSION: No evidence of iliocaval venous thrombus or  stenosis. Medical Decision Making    [name] is a 74 y.o. female who presents with: left lower extremity edema x 3 weeks associated with increased warmth and tenderness to palpation.  No evidence of DVT.  No significant reflux and no indication for ablation therapy.  Discussed the case with Dr. Donzetta Matters.  He agrees with antibiotic therapy, compression and elevation.  We measured her legs today and will make arrangements for ordering Medi-van compression stockings with slipper to help her apply the stockings.  We will follow-up in 2 weeks to ensure that symptoms are improving.  Barbie Banner, PA-C [facility] Office: [phone]  12/02/2019, 2:58 PM  Clinic MD: Donzetta Matters

## 2019-12-06 ENCOUNTER — Telehealth: Payer: Self-pay | Admitting: *Deleted

## 2019-12-06 NOTE — Telephone Encounter (Signed)
Received VM from patient.   Reports that she has questions in regards to her bone density.   Call placed to patient. [place].

## 2019-12-08 NOTE — Telephone Encounter (Signed)
Multiple calls placed to patient with no answer and no return call.   Message to be closed.  

## 2019-12-15 ENCOUNTER — Ambulatory Visit (INDEPENDENT_AMBULATORY_CARE_PROVIDER_SITE_OTHER): Payer: Medicare HMO | Admitting: Physician Assistant

## 2019-12-15 ENCOUNTER — Other Ambulatory Visit: Payer: Self-pay

## 2019-12-15 VITALS — BP 137/72 | HR 100 | Temp 97.4°F | Resp 20 | Ht 68.0 in | Wt 160.5 lb

## 2019-12-15 DIAGNOSIS — M7989 Other specified soft tissue disorders: Secondary | ICD-10-CM

## 2019-12-15 MED ORDER — CEPHALEXIN 500 MG PO CAPS: 500.0000 mg | ORAL_CAPSULE | Freq: Two times a day (BID) | ORAL | 0 refills | Status: DC

## 2019-12-15 NOTE — Progress Notes (Signed)
[facility]           OF   History and Physical   [name] is a 74 y.o. female who presents for follow up from a couple of weeks ago.  She was seen on 12/02/2019.  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  She was complaining of tenderness of the left foot up the front of her leg.  She did have some redness and was prescribed doxycycline and advised to f/u in 2 weeks for Medivan compression stockings with slipper due to the fact she was having a difficult time putting on the stockings.  She did not have any evidence of DVT but did have some reflux in the lower leg.   She comes in today and states that she is not any better.  She states the antibiotic did not work.  She continues to have swelling in the LLE and c/o pain coming up from the bottom her her foot around the ankle up the front of her leg.  She states that this also starts on the front of her leg ankle level and goes up her leg.  She also has some pain in her toes and thinks it might originate there.  She has not been wearing her stockings bc she cannot get them past her ankle due to pain.  She does try to elevate her legs.  She has not tried putting on the stockings in the am before getting out of bed.   She states that the swelling around her toes and under her foot may be a little bit better.  She states she does not think she has had a bite or sting.   She has not had fever.  She states that her foot and leg are still warm but this is some better.    Past Medical History:  Diagnosis Date  . Arthritis   . Breast cancer ([place])    left breast/lumpectomy/chemo/rad 2003  . COPD (chronic obstructive pulmonary disease) ([place])   . DDD (degenerative disc disease), lumbar 08/31/2017  . Diverticulitis   . GERD (gastroesophageal reflux disease)   . Hemoptysis   . Hyperlipidemia   . Iron deficiency   . Macular degeneration    bilateral  . Personal  history of radiation therapy 2003  . Pulmonary embolism ([place])   . Tachycardia     Past Surgical History:  Procedure Laterality Date  . BACK SURGERY    . BREAST LUMPECTOMY    . COLONOSCOPY  01/28/2012   Procedure: COLONOSCOPY;  Surgeon: Rogene Houston, MD;  [facility];  Service: Endoscopy;  Laterality: N/A;  1200  . COLONOSCOPY  01-2012  . ESOPHAGOGASTRODUODENOSCOPY N/A 07/01/2013   Procedure: ESOPHAGOGASTRODUODENOSCOPY (EGD);  Surgeon: Rogene Houston, MD;  [facility];  Service: Endoscopy;  Laterality: N/A;  1:10  . LAPAROSCOPIC TUBAL LIGATION  1970  . TONSILLECTOMY     Patient was age 69    Social History   Socioeconomic History  . Marital status: Married    Spouse name: Simona Huh  . Number of children: 1  . Years of education: some college  . Highest education level: Not on file  Occupational History  . Not on file  Tobacco Use  . Smoking status: Current Every Day Smoker    Packs/day: 1.00    Years: 30.00    Pack years: 30.00  Types: Cigarettes  . Smokeless tobacco: Never Used  . Tobacco comment: 05/17/18 1- 1.5 PPD  Substance and Sexual Activity  . Alcohol use: No    Alcohol/week: 0.0 standard drinks  . Drug use: No  . Sexual activity: Not Currently  Other Topics Concern  . Not on file  Social History Narrative   Lives with husband   Caffeine- none   Social Determinants of Health   Financial Resource Strain:   . Difficulty of Paying Living Expenses:   Food Insecurity:   . Worried About Charity fundraiser in the Last Year:   . Arboriculturist in the Last Year:   Transportation Needs:   . Film/video editor (Medical):   Marland Kitchen Lack of Transportation (Non-Medical):   Physical Activity:   . Days of Exercise per Week:   . Minutes of Exercise per Session:   Stress:   . Feeling of Stress :   Social Connections:   . Frequency of Communication with Friends and Family:   . Frequency of Social Gatherings with Friends and Family:   .  Attends Religious Services:   . Active Member of Clubs or Organizations:   . Attends Archivist Meetings:   Marland Kitchen Marital Status:   Intimate Partner Violence:   . Fear of Current or Ex-Partner:   . Emotionally Abused:   Marland Kitchen Physically Abused:   . Sexually Abused:      Family History  Problem Relation Age of Onset  . Heart disease Mother   . Alzheimer's disease Mother   . Heart disease Father   . Lung disease Father   . Healthy Son     Current Outpatient Medications  Medication Sig Dispense Refill  . aspirin EC 325 MG tablet Take 1 tablet (325 mg total) by mouth daily. 30 tablet 0  . CALCIUM PO Take 1 tablet by mouth daily.    . cephALEXin (KEFLEX) 500 MG capsule Take 1 capsule (500 mg total) by mouth 2 (two) times daily. 14 capsule 0  . Cholecalciferol (VITAMIN D PO) Take 1 tablet by mouth daily.    . diclofenac (VOLTAREN) 75 MG EC tablet Take 1 tablet (75 mg total) by mouth 2 (two) times daily. 180 tablet 2  . doxycycline (VIBRAMYCIN) 100 MG capsule Take 1 capsule (100 mg total) by mouth 2 (two) times daily. 28 capsule 0  . Magnesium Oxide 400 MG CAPS Take 1 capsule daily  0  . Multiple Vitamin (MULTIVITAMIN WITH MINERALS) TABS tablet Take 1 tablet by mouth daily.    . Multiple Vitamins-Minerals (PRESERVISION AREDS 2) CAPS Take by mouth 2 (two) times daily.     . Omega-3 Fatty Acids (FISH OIL) 1000 MG CAPS Take by mouth daily.    Marland Kitchen omeprazole (PRILOSEC) 10 MG capsule Take 10 mg by mouth daily.    . timolol (TIMOPTIC) 0.5 % ophthalmic solution Place 1 drop into the left eye daily.     . varenicline (CHANTIX STARTING MONTH PAK) 0.5 MG X 11 & 1 MG X 42 tablet Take 0.5 mg tablet by mouth 1x daily for 3 days, then increase to 0.5 mg tablet 2x daily for 4 days, then increase to 1 mg tablet 2x daily. 53 tablet 0   No current facility-administered medications for this visit.    Allergies  Allergen Reactions  . Sulfa Antibiotics Other (See Comments)    Childhood Allergy   05/17/18 patient denies    REVIEW OF SYSTEMS:   [X]  denotes positive  finding, [ ]  denotes negative finding Cardiac  Comments:  Chest pain or chest pressure:    Shortness of breath upon exertion:    Short of breath when lying flat:    Irregular heart rhythm:        Vascular    Pain in calf, thigh, or hip brought on by ambulation:    Pain in feet at night that wakes you up from your sleep:  x   Blood clot in your veins:    Leg swelling:  x       Pulmonary    Oxygen at home:    Productive cough:     Wheezing:         Neurologic    Sudden weakness in arms or legs:     Sudden numbness in arms or legs:     Sudden onset of difficulty speaking or slurred speech:    Temporary loss of vision in one eye:     Problems with dizziness:         Gastrointestinal    Blood in stool:     Vomited blood:         Genitourinary    Burning when urinating:     Blood in urine:        Psychiatric    Major depression:         Hematologic    Bleeding problems:    Problems with blood clotting too easily:        Skin    Rashes or ulcers:        Constitutional    Fever or chills:      PHYSICAL EXAMINATION:  Today's Vitals   12/15/19 1146  BP: 137/72  Pulse: 100  Resp: 20  Temp: (!) 97.4 F (36.3 C)  TempSrc: Temporal  SpO2: 96%  Weight: 160 lb 8 oz (72.8 kg)  Height: 5\' 8"  (1.727 m)   Body mass index is 24.4 kg/m.   General:  WDWN in NAD; vital signs documented above Gait: Not observed HENT: WNL, normocephalic Pulmonary: normal non-labored breathing without wheezing Cardiac: regular HR Skin: without rashes Vascular Exam/Pulses:  Right Left  DP 2+ (normal) 2+ (normal)  PT Unable to palpate  Unable to palpate    Extremities: LLE swelling with color change of mild erythema. There is some mild redness but does not appear like cellulitis.  She does have some swelling around the toes and the foot.  Musculoskeletal: no muscle wasting or atrophy  Neurologic: A&O X 3;   moving all extremities equally Psychiatric:  The pt has flat affect.   Non-Invasive Vascular Imaging:   Venous duplex on 12/02/2019: LEFT     Reflux NoRefluxReflux TimeDiameter cmsComments               Yes                   +--------------+---------+------+-----------+------------+--------+  GSV at SFJ                  0.85        +--------------+---------+------+-----------+------------+--------+  GSV prox thigh                0.57        +--------------+---------+------+-----------+------------+--------+  GSV mid thigh                 0.19        +--------------+---------+------+-----------+------------+--------+  GSV dist thigh  0.29        +--------------+---------+------+-----------+------------+--------+  GSV at knee                  0.33        +--------------+---------+------+-----------+------------+--------+  GSV prox calf                 0.18        +--------------+---------+------+-----------+------------+--------+  GSV mid calf       yes  >500 ms   0.21        +--------------+---------+------+-----------+------------+--------+  SSV Pop Fossa       yes  >500 ms   0.23        +--------------+---------+------+-----------+------------+--------+  SSV prox calf       yes  >500 ms   0.33        +--------------+---------+------+-----------+------------+--------+  SSV mid calf                 0.24        +--------------+---------+------+-----------+------------+--------+                         0.24        +--------------+---------+------+-----------+------------+--------+   Summary:  Left:  - No evidence of deep vein thrombosis seen  in the left lower extremity,  from the common femoral through the popliteal veins.  - Venous reflux is noted in the left greater saphenous vein in the calf.  - Venous reflux is noted in the left short saphenous vein.    [name] is a 74 y.o. female who presents today for follow up.    Pt was to follow up today for appliance to apply stockings.  RN applied 15-65mmHg knee high stockings and this was tolerable for the pt.  She and I both reiterated that she should shower at night so that she can place the stockings on prior to ever getting out of bed before her leg can swell more.  She will continue to elevate her leg.  Doubt that she has cellulitis, but since she did not get any improvement with Doxycycline, I did sent rx for Keflex 500mg  bid x 1 week.  She denies any allergies.  -pt will f/u in a couple of weeks to see if the 15-56mmHg stockings and abx have helped.    Leontine Locket, [facility] 12/15/2019 1:25 PM  Clinic MD:  Oneida Alar

## 2019-12-29 ENCOUNTER — Ambulatory Visit: Payer: Medicare HMO

## 2020-01-03 DIAGNOSIS — N3090 Cystitis, unspecified without hematuria: Secondary | ICD-10-CM | POA: Diagnosis not present

## 2020-01-03 DIAGNOSIS — Z6823 Body mass index (BMI) 23.0-23.9, adult: Secondary | ICD-10-CM | POA: Diagnosis not present

## 2020-01-09 DIAGNOSIS — M5441 Lumbago with sciatica, right side: Secondary | ICD-10-CM | POA: Diagnosis not present

## 2020-01-09 DIAGNOSIS — I6389 Other cerebral infarction: Secondary | ICD-10-CM | POA: Diagnosis not present

## 2020-01-09 DIAGNOSIS — Z6823 Body mass index (BMI) 23.0-23.9, adult: Secondary | ICD-10-CM | POA: Diagnosis not present

## 2020-01-09 DIAGNOSIS — R42 Dizziness and giddiness: Secondary | ICD-10-CM | POA: Diagnosis not present

## 2020-01-09 DIAGNOSIS — G919 Hydrocephalus, unspecified: Secondary | ICD-10-CM | POA: Diagnosis not present

## 2020-01-09 DIAGNOSIS — I10 Essential (primary) hypertension: Secondary | ICD-10-CM | POA: Diagnosis not present

## 2020-01-09 DIAGNOSIS — G8929 Other chronic pain: Secondary | ICD-10-CM | POA: Diagnosis not present

## 2020-01-09 DIAGNOSIS — M545 Low back pain: Secondary | ICD-10-CM | POA: Diagnosis not present

## 2020-01-09 DIAGNOSIS — Z7689 Persons encountering health services in other specified circumstances: Secondary | ICD-10-CM | POA: Diagnosis not present

## 2020-01-16 DIAGNOSIS — Z6823 Body mass index (BMI) 23.0-23.9, adult: Secondary | ICD-10-CM | POA: Diagnosis not present

## 2020-01-16 DIAGNOSIS — Z87440 Personal history of urinary (tract) infections: Secondary | ICD-10-CM | POA: Diagnosis not present

## 2020-01-16 DIAGNOSIS — G8929 Other chronic pain: Secondary | ICD-10-CM | POA: Diagnosis not present

## 2020-01-16 DIAGNOSIS — I6389 Other cerebral infarction: Secondary | ICD-10-CM | POA: Diagnosis not present

## 2020-01-16 DIAGNOSIS — M5441 Lumbago with sciatica, right side: Secondary | ICD-10-CM | POA: Diagnosis not present

## 2020-01-16 DIAGNOSIS — Z1331 Encounter for screening for depression: Secondary | ICD-10-CM | POA: Diagnosis not present

## 2020-01-16 DIAGNOSIS — Z1389 Encounter for screening for other disorder: Secondary | ICD-10-CM | POA: Diagnosis not present

## 2020-01-16 DIAGNOSIS — I10 Essential (primary) hypertension: Secondary | ICD-10-CM | POA: Diagnosis not present

## 2020-01-16 DIAGNOSIS — Z7689 Persons encountering health services in other specified circumstances: Secondary | ICD-10-CM | POA: Diagnosis not present

## 2020-01-16 DIAGNOSIS — R42 Dizziness and giddiness: Secondary | ICD-10-CM | POA: Diagnosis not present

## 2020-01-16 DIAGNOSIS — G919 Hydrocephalus, unspecified: Secondary | ICD-10-CM | POA: Diagnosis not present

## 2020-01-16 DIAGNOSIS — M545 Low back pain: Secondary | ICD-10-CM | POA: Diagnosis not present

## 2020-01-16 DIAGNOSIS — I4891 Unspecified atrial fibrillation: Secondary | ICD-10-CM | POA: Diagnosis not present

## 2020-01-18 DIAGNOSIS — H353211 Exudative age-related macular degeneration, right eye, with active choroidal neovascularization: Secondary | ICD-10-CM | POA: Diagnosis not present

## 2020-01-18 DIAGNOSIS — H35372 Puckering of macula, left eye: Secondary | ICD-10-CM | POA: Diagnosis not present

## 2020-01-18 DIAGNOSIS — H353222 Exudative age-related macular degeneration, left eye, with inactive choroidal neovascularization: Secondary | ICD-10-CM | POA: Diagnosis not present

## 2020-02-13 DIAGNOSIS — H353232 Exudative age-related macular degeneration, bilateral, with inactive choroidal neovascularization: Secondary | ICD-10-CM | POA: Diagnosis not present

## 2020-02-13 DIAGNOSIS — H35373 Puckering of macula, bilateral: Secondary | ICD-10-CM | POA: Diagnosis not present

## 2020-02-13 DIAGNOSIS — H401124 Primary open-angle glaucoma, left eye, indeterminate stage: Secondary | ICD-10-CM | POA: Diagnosis not present

## 2020-02-16 DIAGNOSIS — Z7689 Persons encountering health services in other specified circumstances: Secondary | ICD-10-CM | POA: Diagnosis not present

## 2020-02-16 DIAGNOSIS — R42 Dizziness and giddiness: Secondary | ICD-10-CM | POA: Diagnosis not present

## 2020-02-16 DIAGNOSIS — I10 Essential (primary) hypertension: Secondary | ICD-10-CM | POA: Diagnosis not present

## 2020-02-16 DIAGNOSIS — M5441 Lumbago with sciatica, right side: Secondary | ICD-10-CM | POA: Diagnosis not present

## 2020-02-16 DIAGNOSIS — Z6823 Body mass index (BMI) 23.0-23.9, adult: Secondary | ICD-10-CM | POA: Diagnosis not present

## 2020-02-16 DIAGNOSIS — M25511 Pain in right shoulder: Secondary | ICD-10-CM | POA: Diagnosis not present

## 2020-02-16 DIAGNOSIS — I6389 Other cerebral infarction: Secondary | ICD-10-CM | POA: Diagnosis not present

## 2020-02-16 DIAGNOSIS — M545 Low back pain: Secondary | ICD-10-CM | POA: Diagnosis not present

## 2020-02-28 DIAGNOSIS — M5441 Lumbago with sciatica, right side: Secondary | ICD-10-CM | POA: Diagnosis not present

## 2020-02-28 DIAGNOSIS — M545 Low back pain: Secondary | ICD-10-CM | POA: Diagnosis not present

## 2020-02-28 DIAGNOSIS — Z6823 Body mass index (BMI) 23.0-23.9, adult: Secondary | ICD-10-CM | POA: Diagnosis not present

## 2020-02-28 DIAGNOSIS — I872 Venous insufficiency (chronic) (peripheral): Secondary | ICD-10-CM | POA: Diagnosis not present

## 2020-03-14 ENCOUNTER — Ambulatory Visit: Payer: Medicare HMO | Admitting: Physician Assistant

## 2020-03-16 DIAGNOSIS — M545 Low back pain, unspecified: Secondary | ICD-10-CM | POA: Diagnosis not present

## 2020-03-16 DIAGNOSIS — M5441 Lumbago with sciatica, right side: Secondary | ICD-10-CM | POA: Diagnosis not present

## 2020-03-16 DIAGNOSIS — S39012A Strain of muscle, fascia and tendon of lower back, initial encounter: Secondary | ICD-10-CM | POA: Diagnosis not present

## 2020-03-16 DIAGNOSIS — M1A0710 Idiopathic chronic gout, right ankle and foot, without tophus (tophi): Secondary | ICD-10-CM | POA: Diagnosis not present

## 2020-03-16 DIAGNOSIS — I872 Venous insufficiency (chronic) (peripheral): Secondary | ICD-10-CM | POA: Diagnosis not present

## 2020-03-16 DIAGNOSIS — Z6823 Body mass index (BMI) 23.0-23.9, adult: Secondary | ICD-10-CM | POA: Diagnosis not present

## 2020-03-23 DIAGNOSIS — R600 Localized edema: Secondary | ICD-10-CM | POA: Diagnosis not present

## 2020-04-10 DIAGNOSIS — E782 Mixed hyperlipidemia: Secondary | ICD-10-CM | POA: Diagnosis not present

## 2020-04-10 DIAGNOSIS — Z13220 Encounter for screening for lipoid disorders: Secondary | ICD-10-CM | POA: Diagnosis not present

## 2020-05-09 DIAGNOSIS — H3581 Retinal edema: Secondary | ICD-10-CM | POA: Diagnosis not present

## 2020-05-09 DIAGNOSIS — H35372 Puckering of macula, left eye: Secondary | ICD-10-CM | POA: Diagnosis not present

## 2020-05-09 DIAGNOSIS — H353232 Exudative age-related macular degeneration, bilateral, with inactive choroidal neovascularization: Secondary | ICD-10-CM | POA: Diagnosis not present

## 2020-07-02 DIAGNOSIS — G8929 Other chronic pain: Secondary | ICD-10-CM | POA: Diagnosis not present

## 2020-07-02 DIAGNOSIS — K219 Gastro-esophageal reflux disease without esophagitis: Secondary | ICD-10-CM | POA: Diagnosis not present

## 2020-07-02 DIAGNOSIS — M25571 Pain in right ankle and joints of right foot: Secondary | ICD-10-CM | POA: Diagnosis not present

## 2020-08-08 DIAGNOSIS — H3581 Retinal edema: Secondary | ICD-10-CM | POA: Diagnosis not present

## 2020-08-08 DIAGNOSIS — H35372 Puckering of macula, left eye: Secondary | ICD-10-CM | POA: Diagnosis not present

## 2020-08-08 DIAGNOSIS — H353232 Exudative age-related macular degeneration, bilateral, with inactive choroidal neovascularization: Secondary | ICD-10-CM | POA: Diagnosis not present

## 2020-08-13 DIAGNOSIS — H35373 Puckering of macula, bilateral: Secondary | ICD-10-CM | POA: Diagnosis not present

## 2020-08-13 DIAGNOSIS — H353232 Exudative age-related macular degeneration, bilateral, with inactive choroidal neovascularization: Secondary | ICD-10-CM | POA: Diagnosis not present

## 2020-08-13 DIAGNOSIS — H18593 Other hereditary corneal dystrophies, bilateral: Secondary | ICD-10-CM | POA: Diagnosis not present

## 2020-08-13 DIAGNOSIS — Z961 Presence of intraocular lens: Secondary | ICD-10-CM | POA: Diagnosis not present

## 2020-08-13 DIAGNOSIS — H401121 Primary open-angle glaucoma, left eye, mild stage: Secondary | ICD-10-CM | POA: Diagnosis not present

## 2020-08-14 ENCOUNTER — Other Ambulatory Visit (HOSPITAL_COMMUNITY): Payer: Self-pay | Admitting: Family Medicine

## 2020-08-14 DIAGNOSIS — Z1231 Encounter for screening mammogram for malignant neoplasm of breast: Secondary | ICD-10-CM

## 2020-08-20 DIAGNOSIS — M545 Low back pain, unspecified: Secondary | ICD-10-CM | POA: Diagnosis not present

## 2020-08-20 DIAGNOSIS — G919 Hydrocephalus, unspecified: Secondary | ICD-10-CM | POA: Diagnosis not present

## 2020-09-07 DIAGNOSIS — S39012A Strain of muscle, fascia and tendon of lower back, initial encounter: Secondary | ICD-10-CM | POA: Diagnosis not present

## 2020-09-07 DIAGNOSIS — Z6822 Body mass index (BMI) 22.0-22.9, adult: Secondary | ICD-10-CM | POA: Diagnosis not present

## 2020-09-14 ENCOUNTER — Ambulatory Visit (HOSPITAL_COMMUNITY)
Admission: RE | Admit: 2020-09-14 | Discharge: 2020-09-14 | Disposition: A | Discharge status: Home / Self Care | Payer: Medicare HMO | Source: Ambulatory Visit | Attending: Family Medicine | Admitting: Family Medicine

## 2020-09-14 ENCOUNTER — Other Ambulatory Visit: Payer: Self-pay

## 2020-09-14 DIAGNOSIS — Z1231 Encounter for screening mammogram for malignant neoplasm of breast: Secondary | ICD-10-CM | POA: Insufficient documentation

## 2020-09-26 DIAGNOSIS — G8929 Other chronic pain: Secondary | ICD-10-CM | POA: Diagnosis not present

## 2020-09-26 DIAGNOSIS — M25571 Pain in right ankle and joints of right foot: Secondary | ICD-10-CM | POA: Diagnosis not present

## 2020-09-26 DIAGNOSIS — K219 Gastro-esophageal reflux disease without esophagitis: Secondary | ICD-10-CM | POA: Diagnosis not present

## 2020-10-31 ENCOUNTER — Ambulatory Visit: Payer: Medicare HMO | Admitting: Physician Assistant

## 2020-11-07 DIAGNOSIS — H353232 Exudative age-related macular degeneration, bilateral, with inactive choroidal neovascularization: Secondary | ICD-10-CM | POA: Diagnosis not present

## 2020-11-07 DIAGNOSIS — H35372 Puckering of macula, left eye: Secondary | ICD-10-CM | POA: Diagnosis not present

## 2020-11-08 DIAGNOSIS — F17210 Nicotine dependence, cigarettes, uncomplicated: Secondary | ICD-10-CM | POA: Diagnosis not present

## 2020-11-08 DIAGNOSIS — M25572 Pain in left ankle and joints of left foot: Secondary | ICD-10-CM | POA: Diagnosis not present

## 2020-11-08 DIAGNOSIS — G8929 Other chronic pain: Secondary | ICD-10-CM | POA: Diagnosis not present

## 2020-11-08 DIAGNOSIS — M25571 Pain in right ankle and joints of right foot: Secondary | ICD-10-CM | POA: Diagnosis not present

## 2020-12-19 ENCOUNTER — Other Ambulatory Visit (HOSPITAL_COMMUNITY): Payer: Self-pay | Admitting: Family Medicine

## 2020-12-19 DIAGNOSIS — F17210 Nicotine dependence, cigarettes, uncomplicated: Secondary | ICD-10-CM | POA: Diagnosis not present

## 2020-12-19 DIAGNOSIS — M810 Age-related osteoporosis without current pathological fracture: Secondary | ICD-10-CM

## 2020-12-19 DIAGNOSIS — G8929 Other chronic pain: Secondary | ICD-10-CM | POA: Diagnosis not present

## 2020-12-19 DIAGNOSIS — M545 Low back pain, unspecified: Secondary | ICD-10-CM | POA: Diagnosis not present

## 2020-12-19 DIAGNOSIS — M8580 Other specified disorders of bone density and structure, unspecified site: Secondary | ICD-10-CM | POA: Diagnosis not present

## 2020-12-25 ENCOUNTER — Other Ambulatory Visit (HOSPITAL_COMMUNITY): Payer: Self-pay | Admitting: Family Medicine

## 2020-12-25 DIAGNOSIS — M545 Low back pain, unspecified: Secondary | ICD-10-CM

## 2020-12-25 DIAGNOSIS — G8929 Other chronic pain: Secondary | ICD-10-CM

## 2021-01-04 ENCOUNTER — Other Ambulatory Visit: Payer: Self-pay

## 2021-01-04 ENCOUNTER — Ambulatory Visit (HOSPITAL_COMMUNITY)
Admission: RE | Admit: 2021-01-04 | Discharge: 2021-01-04 | Disposition: A | Discharge status: Home / Self Care | Payer: Medicare HMO | Source: Ambulatory Visit | Attending: Family Medicine | Admitting: Family Medicine

## 2021-01-04 DIAGNOSIS — G8929 Other chronic pain: Secondary | ICD-10-CM | POA: Diagnosis not present

## 2021-01-04 DIAGNOSIS — M545 Low back pain, unspecified: Secondary | ICD-10-CM | POA: Diagnosis not present

## 2021-01-09 ENCOUNTER — Ambulatory Visit (INDEPENDENT_AMBULATORY_CARE_PROVIDER_SITE_OTHER): Payer: Medicare HMO | Admitting: Physician Assistant

## 2021-01-09 ENCOUNTER — Encounter: Payer: Self-pay | Admitting: Physician Assistant

## 2021-01-09 ENCOUNTER — Other Ambulatory Visit: Payer: Self-pay

## 2021-01-09 DIAGNOSIS — L304 Erythema intertrigo: Secondary | ICD-10-CM | POA: Diagnosis not present

## 2021-01-09 DIAGNOSIS — Z8679 Personal history of other diseases of the circulatory system: Secondary | ICD-10-CM

## 2021-01-09 DIAGNOSIS — Z1283 Encounter for screening for malignant neoplasm of skin: Secondary | ICD-10-CM | POA: Diagnosis not present

## 2021-01-15 ENCOUNTER — Encounter: Payer: Self-pay | Admitting: Physician Assistant

## 2021-01-15 NOTE — Progress Notes (Signed)
   Follow-Up Visit   Subjective  [name] is a 74 y.o. female who presents for the following: Annual Exam (Both ears- itch more so the right ear- tx over the counter product. NO personal or family history of melanoma or non mole skin cancer.).   The following portions of the chart were reviewed this encounter and updated as appropriate:  Tobacco  Allergies  Meds  Problems  Med Hx  Surg Hx  Fam Hx      Objective  Well appearing patient in no apparent distress; mood and affect are within normal limits.  A full examination was performed including scalp, head, eyes, ears, nose, lips, neck, chest, axillae, abdomen, back, buttocks, bilateral upper extremities, bilateral lower extremities, hands, feet, fingers, toes, fingernails, and toenails. All findings within normal limits unless otherwise noted below.  Left Lower Leg - Anterior, Right Lower Leg - Anterior Erythema and pitting edema. Distal dorsal pedal and posterior pulses present. No ulcerations.  head to toe No atypical nevi No signs of non-mole skin cancer.   Left Postauricular fold, Right Postauricular fold Erythema with slight scale.   Assessment & Plan  History of venous disease (2) Left Lower Leg - Anterior; Right Lower Leg - Anterior  Continue follow up with PCP and vascular specialists. Elevate lower extremities as much as possible.  Encounter for screening for malignant neoplasm of skin head to toe  Yearly skin exams.  Erythema intertrigo Right Postauricular fold; Left Postauricular fold  OTC clotrimazole and hydrocortisone    I, Niki Payment, PA-C, have reviewed all documentation's for this visit.  The documentation on 01/15/21 for the exam, diagnosis, procedures and orders are all accurate and complete.

## 2021-01-15 NOTE — Progress Notes (Deleted)
   Follow-Up Visit   Subjective  [name] is a 75 y.o. female who presents for the following: Annual Exam (Both ears- itch more so the right ear- tx over the counter product. NO personal or family history of melanoma or non mole skin cancer.). Has concerns about her red and swollen legs that are painful. She has difficulty with ambulation. She is still smoking.   The following portions of the chart were reviewed this encounter and updated as appropriate:  Tobacco  Allergies  Meds  Problems  Med Hx  Surg Hx  Fam Hx      Objective  Well appearing patient in no apparent distress; mood and affect are within normal limits.  A full examination was performed including scalp, head, eyes, ears, nose, lips, neck, chest, axillae, abdomen, back, buttocks, bilateral upper extremities, bilateral lower extremities, hands, feet, fingers, toes, fingernails, and toenails. All findings within normal limits unless otherwise noted below.  Left Lower Leg - Anterior, Right Lower Leg - Anterior Erythema and pitting edema. Distal dorsal pedal and posterior pulses present. No ulcerations.  head to toe No atypical nevi No signs of non-mole skin cancer.    Assessment & Plan  History of venous disease (2) Left Lower Leg - Anterior; Right Lower Leg - Anterior  Continue follow up with PCP and vascular specialists.  Encounter for screening for malignant neoplasm of skin head to toe  Yearly skin exams.    I, Gratia Disla, PA-C, have reviewed all documentation's for this visit.  The documentation on 01/15/21 for the exam, diagnosis, procedures and orders are all accurate and complete.

## 2021-01-16 ENCOUNTER — Other Ambulatory Visit: Payer: Self-pay

## 2021-01-16 ENCOUNTER — Emergency Department (HOSPITAL_COMMUNITY): Payer: Medicare HMO

## 2021-01-16 ENCOUNTER — Emergency Department (HOSPITAL_COMMUNITY)
Admission: EM | Admit: 2021-01-16 | Discharge: 2021-01-16 | Disposition: A | Discharge status: Home / Self Care | Payer: Medicare HMO | Attending: Emergency Medicine | Admitting: Emergency Medicine

## 2021-01-16 DIAGNOSIS — J449 Chronic obstructive pulmonary disease, unspecified: Secondary | ICD-10-CM | POA: Insufficient documentation

## 2021-01-16 DIAGNOSIS — S50811A Abrasion of right forearm, initial encounter: Secondary | ICD-10-CM | POA: Diagnosis not present

## 2021-01-16 DIAGNOSIS — Z853 Personal history of malignant neoplasm of breast: Secondary | ICD-10-CM | POA: Insufficient documentation

## 2021-01-16 DIAGNOSIS — W01198A Fall on same level from slipping, tripping and stumbling with subsequent striking against other object, initial encounter: Secondary | ICD-10-CM | POA: Insufficient documentation

## 2021-01-16 DIAGNOSIS — Y9289 Other specified places as the place of occurrence of the external cause: Secondary | ICD-10-CM | POA: Diagnosis not present

## 2021-01-16 DIAGNOSIS — F17210 Nicotine dependence, cigarettes, uncomplicated: Secondary | ICD-10-CM | POA: Diagnosis not present

## 2021-01-16 DIAGNOSIS — Z7982 Long term (current) use of aspirin: Secondary | ICD-10-CM | POA: Insufficient documentation

## 2021-01-16 DIAGNOSIS — S41111A Laceration without foreign body of right upper arm, initial encounter: Secondary | ICD-10-CM | POA: Diagnosis not present

## 2021-01-16 DIAGNOSIS — S0990XA Unspecified injury of head, initial encounter: Secondary | ICD-10-CM | POA: Diagnosis present

## 2021-01-16 DIAGNOSIS — I639 Cerebral infarction, unspecified: Secondary | ICD-10-CM | POA: Diagnosis not present

## 2021-01-16 DIAGNOSIS — S0101XA Laceration without foreign body of scalp, initial encounter: Secondary | ICD-10-CM | POA: Diagnosis not present

## 2021-01-16 DIAGNOSIS — S0181XA Laceration without foreign body of other part of head, initial encounter: Secondary | ICD-10-CM | POA: Diagnosis not present

## 2021-01-16 DIAGNOSIS — S59911A Unspecified injury of right forearm, initial encounter: Secondary | ICD-10-CM | POA: Diagnosis not present

## 2021-01-16 NOTE — ED Provider Notes (Signed)
Emergency Medicine Provider Triage Evaluation Note  [name] , a 75 y.o. female  was evaluated in triage.  Pt complains of fall.  Pt his her head and cut her right arm  Review of Systems  Positive: Scalp laceration Negative:   Physical Exam  Temp 98.4 F (36.9 C) (Oral)   Ht '5\' 8"'$  (1.727 m)   Wt 68 kg   BMI 22.81 kg/m  Gen:   Awake, no distress   Resp:  Normal effort  MSK:   Moves extremities without difficulty  Other:    Medical Decision Making  Medically screening exam initiated at 6:36 PM.  Appropriate orders placed.  Saydee Ririe Bancroft was informed that the remainder of the evaluation will be completed by another provider, this initial triage assessment does not replace that evaluation, and the importance of remaining in the ED until their evaluation is complete.     Fransico Meadow, [place] 01/16/21 1837    Fredia Sorrow, MD 01/18/21 [phone]

## 2021-01-16 NOTE — ED Triage Notes (Signed)
Pt fell at home this evening striking the right side of her head. Bleeding noted. Pt takes '324mg'$  ASA daily. Pt states the fall was from a trip. Pt did not lose consciousness.

## 2021-01-16 NOTE — ED Provider Notes (Signed)
[facility] EMERGENCY DEPARTMENT Provider Note   CSN: [id number] Arrival date & time: 01/16/21  1715     History Chief Complaint  Patient presents with   Lytle Michaels    [name] is a 75 y.o. female.  Pt complains of tripping and falling off her steps.  Pt reports she hit her head.  Pt reports she scrapped her arm.    The history is provided by the patient. No language interpreter was used.  Fall This is a new problem. The current episode started less than 1 hour ago. The problem occurs constantly. The problem has not changed since onset.Pertinent negatives include no headaches. Nothing aggravates the symptoms. Nothing relieves the symptoms. She has tried nothing for the symptoms. The treatment provided no relief.      Past Medical History:  Diagnosis Date   Arthritis    Breast cancer ([place])    left breast/lumpectomy/chemo/rad 2003   COPD (chronic obstructive pulmonary disease) ([place])    DDD (degenerative disc disease), lumbar 08/31/2017   Diverticulitis    GERD (gastroesophageal reflux disease)    Hemoptysis    Hyperlipidemia    Iron deficiency    Macular degeneration    bilateral   Personal history of radiation therapy 2003   Pulmonary embolism ([place])    Tachycardia     Patient Active Problem List   Diagnosis Date Noted   H/O: CVA (cerebrovascular accident) 11/01/2019   Bleeding from varicose vein 01/05/2019   Dizziness 01/03/2018   DDD (degenerative disc disease), lumbar 08/31/2017   Generalized OA 08/31/2017   Venous hypertension of both lower extremities 07/03/2017   Osteopenia 05/13/2016   History of left breast cancer 05/13/2016   Glaucoma 05/13/2016   Hyperlipidemia 05/13/2016   Urinary incontinence 05/13/2016   Tobacco use disorder 05/13/2016   GERD (gastroesophageal reflux disease) 06/27/2013   H/O: upper GI bleed 06/27/2013   Epigastric pain 06/27/2013   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[place]) 11/21/2010    Past Surgical History:  Procedure Laterality Date    BACK SURGERY     BREAST LUMPECTOMY     COLONOSCOPY  01/28/2012   Procedure: COLONOSCOPY;  Surgeon: Rogene Houston, MD;  Location: [facility] ENDO [facility];  Service: Endoscopy;  Laterality: N/A;  1200   COLONOSCOPY  01-2012   ESOPHAGOGASTRODUODENOSCOPY N/A 07/01/2013   Procedure: ESOPHAGOGASTRODUODENOSCOPY (EGD);  Surgeon: Rogene Houston, MD;  Location: [facility] ENDO [facility];  Service: Endoscopy;  Laterality: N/A;  1:10   LAPAROSCOPIC TUBAL LIGATION  1970   TONSILLECTOMY     Patient was age 83     OB History   No obstetric history on file.     Family History  Problem Relation Age of Onset   Heart disease Mother    Alzheimer's disease Mother    Heart disease Father    Lung disease Father    Healthy Son     Social History   Tobacco Use   Smoking status: Every Day    Packs/day: 1.00    Years: 30.00    Pack years: 30.00    Types: Cigarettes   Smokeless tobacco: Never   Tobacco comments:    05/17/18 1- 1.5 PPD  Substance Use Topics   Alcohol use: No    Alcohol/week: 0.0 standard drinks   Drug use: No    Home Medications Prior to Admission medications   Medication Sig Start Date End Date Taking? Authorizing Provider  aspirin EC 325 MG tablet Take 1 tablet (325 mg total) by mouth daily. 05/13/18  [place], Modena Nunnery, MD  CALCIUM PO Take 1 tablet by mouth daily.    [provider]  cephALEXin (KEFLEX) 500 MG capsule Take 1 capsule (500 mg total) by mouth 2 (two) times daily. 12/15/19   Rhyne, Hulen Shouts, PA-C  Cholecalciferol (VITAMIN D PO) Take 1 tablet by mouth daily.    [provider]  diclofenac (VOLTAREN) 75 MG EC tablet Take 1 tablet (75 mg total) by mouth 2 (two) times daily. 03/07/19   [place], Modena Nunnery, MD  doxycycline (VIBRAMYCIN) 100 MG capsule Take 1 capsule (100 mg total) by mouth 2 (two) times daily. 12/02/19   Setzer, Edman Circle, PA-C  Magnesium Oxide 400 MG CAPS Take 1 capsule daily 11/01/19   Alycia Rossetti, MD  Multiple Vitamin (MULTIVITAMIN WITH MINERALS) TABS  tablet Take 1 tablet by mouth daily.    [provider]  Multiple Vitamins-Minerals (PRESERVISION AREDS 2) CAPS Take by mouth 2 (two) times daily.     [provider]  Omega-3 Fatty Acids (FISH OIL) 1000 MG CAPS Take by mouth daily.    [provider]  omeprazole (PRILOSEC) 10 MG capsule Take 10 mg by mouth daily.    [provider]  timolol (TIMOPTIC) 0.5 % ophthalmic solution Place 1 drop into the left eye daily.  01/23/17   [provider]  varenicline (CHANTIX STARTING MONTH PAK) 0.5 MG X 11 & 1 MG X 42 tablet Take 0.5 mg tablet by mouth 1x daily for 3 days, then increase to 0.5 mg tablet 2x daily for 4 days, then increase to 1 mg tablet 2x daily. 11/03/19   Alycia Rossetti, MD    Allergies    Sulfa antibiotics  Review of Systems   Review of Systems  Neurological:  Negative for headaches.  All other systems reviewed and are negative.  Physical Exam Updated Vital Signs Temp 98.4 F (36.9 C) (Oral)   Ht '5\' 8"'$  (1.727 m)   Wt 68 kg   BMI 22.81 kg/m   Physical Exam Vitals reviewed.  Constitutional:      Appearance: Normal appearance.  HENT:     Head: Normocephalic.     Comments:  1cm abrasions right forehead     Mouth/Throat:     Mouth: Mucous membranes are moist.  Cardiovascular:     Rate and Rhythm: Normal rate.  Pulmonary:     Effort: Pulmonary effort is normal.  Skin:    Comments: Multiple small skin tears right forearm.   Neurological:     General: No focal deficit present.     Mental Status: She is alert.  Psychiatric:        Mood and Affect: Mood normal.    ED Results / Procedures / Treatments   Labs (all labs ordered are listed, but only abnormal results are displayed) Labs Reviewed - No data to display  EKG None  Radiology CT HEAD WO CONTRAST (5MM)  Result Date: 01/16/2021 CLINICAL DATA:  Head trauma EXAM: CT HEAD WITHOUT CONTRAST TECHNIQUE: Contiguous axial images were obtained from the base of the skull  through the vertex without intravenous contrast. COMPARISON:  05/11/2018 MRI FINDINGS: Brain: No evidence of acute infarction, hemorrhage, extra-axial collection, or mass lesion/mass effect. Unchanged size and configuration the ventricular system, with advanced central volume loss for age. Periventricular white matter changes, likely the sequela of chronic small vessel ischemic disease. Remote cerebellar infarcts. Vascular: No hyperdense vessel or unexpected calcification. Skull: Normal. Negative for fracture or focal lesion. Sinuses/Orbits: No acute  finding. Other: Right lateral frontal scalp laceration and hematoma. IMPRESSION: No acute intracranial process. Right lateral scalp laceration and hematoma. Electronically Signed   By: Merilyn Baba M.D.   On: 01/16/2021 19:34    Procedures .Marland KitchenLaceration Repair  Date/Time: 01/16/2021 9:11 PM Performed by: Fransico Meadow, PA-C Authorized by: Fransico Meadow, PA-C   Consent:    Consent obtained:  Verbal   Consent given by:  Patient   Risks discussed:  Infection, need for additional repair, pain, poor cosmetic result and poor wound healing   Alternatives discussed:  No treatment and delayed treatment Universal protocol:    Procedure explained and questions answered to patient or proxy's satisfaction: yes     Relevant documents present and verified: yes     Test results available: yes     Imaging studies available: yes     Required blood products, implants, devices, and special equipment available: yes     Site/side marked: yes     Immediately prior to procedure, a time out was called: yes     Patient identity confirmed:  Verbally with patient Laceration details:    Location:  Face   Face location:  Forehead   Length (cm):  1 Treatment:    Area cleansed with:  Soap and water   Visualized foreign bodies/material removed: no     Debridement:  None Skin repair:    Repair method:  Tissue adhesive Approximation:    Approximation:  Close    Medications Ordered in ED Medications - No data to display  ED Course  I have reviewed the triage vital signs and the nursing notes.  Pertinent labs & imaging results that were available during my care of the patient were reviewed by me and considered in my medical decision making (see chart for details).    MDM Rules/Calculators/A&P                           MDM:  ct head no acute abnormality  Final Clinical Impression(s) / ED Diagnoses Final diagnoses:  Facial laceration, initial encounter    Rx / DC Orders ED Discharge Orders     None     An After Visit Summary was printed and given to the patient.    Sidney Ace 01/16/21 2112    Fredia Sorrow, MD 01/18/21 [phone]

## 2021-01-21 DIAGNOSIS — S0101XA Laceration without foreign body of scalp, initial encounter: Secondary | ICD-10-CM | POA: Diagnosis not present

## 2021-01-21 DIAGNOSIS — S41111A Laceration without foreign body of right upper arm, initial encounter: Secondary | ICD-10-CM | POA: Diagnosis not present

## 2021-02-01 ENCOUNTER — Other Ambulatory Visit: Payer: Self-pay

## 2021-02-01 ENCOUNTER — Ambulatory Visit
Admission: EM | Admit: 2021-02-01 | Discharge: 2021-02-01 | Disposition: A | Discharge status: Home / Self Care | Payer: Medicare HMO

## 2021-02-01 ENCOUNTER — Encounter: Payer: Self-pay | Admitting: Emergency Medicine

## 2021-02-01 DIAGNOSIS — T07XXXA Unspecified multiple injuries, initial encounter: Secondary | ICD-10-CM

## 2021-02-01 DIAGNOSIS — Z5189 Encounter for other specified aftercare: Secondary | ICD-10-CM

## 2021-02-01 DIAGNOSIS — M7981 Nontraumatic hematoma of soft tissue: Secondary | ICD-10-CM

## 2021-02-01 NOTE — Discharge Instructions (Addendum)
Abrasions seem to be healing well Apply antibiotic ointment Keep covered Apply ice as needed Follow up with PCP as needed Return or go to the ED if you have any new or worsening symptoms such as increased pain, redness, swelling, drainage, discharge, decreased range of motion of extremity, etc..

## 2021-02-01 NOTE — ED Provider Notes (Signed)
[place]   [id number] 02/01/21 Arrival Time: [id number]  CC: Wound check  SUBJECTIVE:  [name] is a 75 y.o. female who presents with a hematoma to RT forearm that occurred few weeks ago after a fall.  Some abrasions over hematoma.  Family concerned for infection. Denies drainage or bleeding.  Denies similar symptoms in the past.  Denies fever, chills, nausea, vomiting, redness, swelling, purulent drainage   ROS: As per HPI.  All other pertinent ROS negative.     Past Medical History:  Diagnosis Date   Arthritis    Breast cancer ([place])    left breast/lumpectomy/chemo/rad 2003   COPD (chronic obstructive pulmonary disease) ([place])    DDD (degenerative disc disease), lumbar 08/31/2017   Diverticulitis    GERD (gastroesophageal reflux disease)    Hemoptysis    Hyperlipidemia    Iron deficiency    Macular degeneration    bilateral   Personal history of radiation therapy 2003   Pulmonary embolism ([place])    Tachycardia    Past Surgical History:  Procedure Laterality Date   BACK SURGERY     BREAST LUMPECTOMY     COLONOSCOPY  01/28/2012   Procedure: COLONOSCOPY;  Surgeon: Rogene Houston, MD;  Location[facility];  Service: Endoscopy;  Laterality: N/A;  1200   COLONOSCOPY  01-2012   ESOPHAGOGASTRODUODENOSCOPY N/A 07/01/2013   Procedure: ESOPHAGOGASTRODUODENOSCOPY (EGD);  Surgeon: Rogene Houston, MD;  Location[facility];  Service: Endoscopy;  Laterality: N/A;  1:10   LAPAROSCOPIC TUBAL LIGATION  1970   TONSILLECTOMY     Patient was age 33   Allergies  Allergen Reactions   Sulfa Antibiotics Other (See Comments)    Childhood Allergy  05/17/18 patient denies   No current facility-administered medications on file prior to encounter.   Current Outpatient Medications on File Prior to Encounter  Medication Sig Dispense Refill   aspirin EC 325 MG tablet Take 1 tablet (325 mg total) by mouth daily. 30 tablet 0   CALCIUM PO Take 1 tablet by mouth daily.      cephALEXin (KEFLEX) 500 MG capsule Take 1 capsule (500 mg total) by mouth 2 (two) times daily. 14 capsule 0   Cholecalciferol (VITAMIN D PO) Take 1 tablet by mouth daily.     diclofenac (VOLTAREN) 75 MG EC tablet Take 1 tablet (75 mg total) by mouth 2 (two) times daily. 180 tablet 2   doxycycline (VIBRAMYCIN) 100 MG capsule Take 1 capsule (100 mg total) by mouth 2 (two) times daily. 28 capsule 0   Magnesium Oxide 400 MG CAPS Take 1 capsule daily  0   Multiple Vitamin (MULTIVITAMIN WITH MINERALS) TABS tablet Take 1 tablet by mouth daily.     Multiple Vitamins-Minerals (PRESERVISION AREDS 2) CAPS Take by mouth 2 (two) times daily.      Omega-3 Fatty Acids (FISH OIL) 1000 MG CAPS Take by mouth daily.     omeprazole (PRILOSEC) 10 MG capsule Take 10 mg by mouth daily.     timolol (TIMOPTIC) 0.5 % ophthalmic solution Place 1 drop into the left eye daily.      varenicline (CHANTIX STARTING MONTH PAK) 0.5 MG X 11 & 1 MG X 42 tablet Take 0.5 mg tablet by mouth 1x daily for 3 days, then increase to 0.5 mg tablet 2x daily for 4 days, then increase to 1 mg tablet 2x daily. 53 tablet 0   Social History   Socioeconomic History   Marital status: Married  Spouse name: Simona Huh   Number of children: 1   Years of education: some college   Highest education level: Not on file  Occupational History   Not on file  Tobacco Use   Smoking status: Every Day    Packs/day: 1.00    Years: 30.00    Pack years: 30.00    Types: Cigarettes   Smokeless tobacco: Never   Tobacco comments:    05/17/18 1- 1.5 PPD  Substance and Sexual Activity   Alcohol use: No    Alcohol/week: 0.0 standard drinks   Drug use: No   Sexual activity: Not Currently  Other Topics Concern   Not on file  Social History Narrative   Lives with husband   Caffeine- none   Social Determinants of Health   Financial Resource Strain: Not on file  Food Insecurity: Not on file  Transportation Needs: Not on file  Physical Activity: Not on  file  Stress: Not on file  Social Connections: Not on file  Intimate Partner Violence: Not on file   Family History  Problem Relation Age of Onset   Heart disease Mother    Alzheimer's disease Mother    Heart disease Father    Lung disease Father    Healthy Son      OBJECTIVE:  Vitals:   02/01/21 1719  BP: (!) 176/65  Pulse: 78  Resp: 16  Temp: 98.2 F (36.8 C)  TempSrc: Oral  SpO2: 98%     General appearance: alert; no distress CV: radial pulse 2+ Skin: baseball size hematoma to Rt posterior lateral forearm with healing overlying abrasion, no obvious erythema, bleeding or purulent drainage; scab formation present Psychological: alert and cooperative; normal mood and affect   ASSESSMENT & PLAN:  1. Visit for wound check   2. Nontraumatic hematoma of soft tissue   3. Abrasions of multiple sites     No orders of the defined types were placed in this encounter.   Abrasions seem to be healing well Apply antibiotic ointment Keep covered Apply ice as needed Follow up with PCP as needed Return or go to the ED if you have any new or worsening symptoms such as increased pain, redness, swelling, drainage, discharge, decreased range of motion of extremity, etc..     Reviewed expectations re: course of current medical issues. Questions answered. Outlined signs and symptoms indicating need for more acute intervention. Patient verbalized understanding. After Visit Summary given.    Lestine Box, PA-C 02/01/21 1744

## 2021-02-01 NOTE — ED Triage Notes (Signed)
Triaged by provider  

## 2021-02-13 DIAGNOSIS — H401121 Primary open-angle glaucoma, left eye, mild stage: Secondary | ICD-10-CM | POA: Diagnosis not present

## 2021-02-13 DIAGNOSIS — H353232 Exudative age-related macular degeneration, bilateral, with inactive choroidal neovascularization: Secondary | ICD-10-CM | POA: Diagnosis not present

## 2021-02-13 DIAGNOSIS — H18593 Other hereditary corneal dystrophies, bilateral: Secondary | ICD-10-CM | POA: Diagnosis not present

## 2021-02-13 DIAGNOSIS — H35373 Puckering of macula, bilateral: Secondary | ICD-10-CM | POA: Diagnosis not present

## 2021-03-27 ENCOUNTER — Emergency Department (HOSPITAL_COMMUNITY): Payer: Medicare HMO

## 2021-03-27 ENCOUNTER — Emergency Department (HOSPITAL_COMMUNITY)
Admission: EM | Admit: 2021-03-27 | Discharge: 2021-03-27 | Disposition: A | Discharge status: Home / Self Care | Payer: Medicare HMO | Attending: Emergency Medicine | Admitting: Emergency Medicine

## 2021-03-27 ENCOUNTER — Other Ambulatory Visit: Payer: Self-pay

## 2021-03-27 ENCOUNTER — Encounter (HOSPITAL_COMMUNITY): Payer: Self-pay

## 2021-03-27 DIAGNOSIS — R55 Syncope and collapse: Secondary | ICD-10-CM | POA: Insufficient documentation

## 2021-03-27 DIAGNOSIS — F17210 Nicotine dependence, cigarettes, uncomplicated: Secondary | ICD-10-CM | POA: Insufficient documentation

## 2021-03-27 DIAGNOSIS — J449 Chronic obstructive pulmonary disease, unspecified: Secondary | ICD-10-CM | POA: Diagnosis not present

## 2021-03-27 DIAGNOSIS — Z853 Personal history of malignant neoplasm of breast: Secondary | ICD-10-CM | POA: Diagnosis not present

## 2021-03-27 DIAGNOSIS — G9389 Other specified disorders of brain: Secondary | ICD-10-CM | POA: Diagnosis not present

## 2021-03-27 DIAGNOSIS — I6602 Occlusion and stenosis of left middle cerebral artery: Secondary | ICD-10-CM | POA: Diagnosis not present

## 2021-03-27 DIAGNOSIS — Z7982 Long term (current) use of aspirin: Secondary | ICD-10-CM | POA: Insufficient documentation

## 2021-03-27 DIAGNOSIS — S199XXA Unspecified injury of neck, initial encounter: Secondary | ICD-10-CM | POA: Diagnosis not present

## 2021-03-27 DIAGNOSIS — I6523 Occlusion and stenosis of bilateral carotid arteries: Secondary | ICD-10-CM | POA: Diagnosis not present

## 2021-03-27 DIAGNOSIS — Z79899 Other long term (current) drug therapy: Secondary | ICD-10-CM | POA: Insufficient documentation

## 2021-03-27 DIAGNOSIS — M47812 Spondylosis without myelopathy or radiculopathy, cervical region: Secondary | ICD-10-CM | POA: Diagnosis not present

## 2021-03-27 DIAGNOSIS — S0990XA Unspecified injury of head, initial encounter: Secondary | ICD-10-CM | POA: Diagnosis not present

## 2021-03-27 LAB — CBC WITH DIFFERENTIAL/PLATELET
Abs Immature Granulocytes: 0.02 10*3/uL (ref 0.00–0.07)
Basophils Absolute: 0.1 10*3/uL (ref 0.0–0.1)
Basophils Relative: 1 %
Eosinophils Absolute: 0.1 10*3/uL (ref 0.0–0.5)
Eosinophils Relative: 1 %
HCT: 38.7 % (ref 36.0–46.0)
Hemoglobin: 12.5 g/dL (ref 12.0–15.0)
Immature Granulocytes: 0 %
Lymphocytes Relative: 39 %
Lymphs Abs: 2.6 10*3/uL (ref 0.7–4.0)
MCH: 30.4 pg (ref 26.0–34.0)
MCHC: 32.3 g/dL (ref 30.0–36.0)
MCV: 94.2 fL (ref 80.0–100.0)
Monocytes Absolute: 0.5 10*3/uL (ref 0.1–1.0)
Monocytes Relative: 7 %
Neutro Abs: 3.5 10*3/uL (ref 1.7–7.7)
Neutrophils Relative %: 52 %
Platelets: 324 10*3/uL (ref 150–400)
RBC: 4.11 MIL/uL (ref 3.87–5.11)
RDW: 14.1 % (ref 11.5–15.5)
WBC: 6.7 10*3/uL (ref 4.0–10.5)
nRBC: 0 % (ref 0.0–0.2)

## 2021-03-27 LAB — TROPONIN I (HIGH SENSITIVITY)
Troponin I (High Sensitivity): 10 ng/L (ref <18)
Troponin I (High Sensitivity): 9 ng/L (ref <18)

## 2021-03-27 LAB — BASIC METABOLIC PANEL
Anion gap: 8 (ref 5–15)
BUN: 13 mg/dL (ref 8–23)
CO2: 29 mmol/L (ref 22–32)
Calcium: 9.1 mg/dL (ref 8.9–10.3)
Chloride: 103 mmol/L (ref 98–111)
Creatinine, Ser: 0.57 mg/dL (ref 0.44–1.00)
GFR, Estimated: >60 mL/min (ref >60)
Glucose, Bld: 91 mg/dL (ref 70–99)
Potassium: 3.7 mmol/L (ref 3.5–5.1)
Sodium: 140 mmol/L (ref 135–145)

## 2021-03-27 LAB — CBG MONITORING, ED: Glucose-Capillary: 88 mg/dL (ref 70–99)

## 2021-03-27 MED ORDER — IOHEXOL 350 MG/ML SOLN
75.0000 mL | Freq: Once | INTRAVENOUS | Status: AC | PRN
  Administered 2021-03-27: 75 mL via INTRAVENOUS

## 2021-03-27 NOTE — ED Notes (Signed)
Pt returned from CT °

## 2021-03-27 NOTE — ED Notes (Signed)
Pt transported to CT ?

## 2021-03-27 NOTE — ED Notes (Signed)
Checked on pt and helped pt to the restroom

## 2021-03-27 NOTE — ED Provider Notes (Signed)
[facility] EMERGENCY DEPARTMENT Provider Note   CSN: [id number] Arrival date & time: 03/27/21  1040     History Chief Complaint  Patient presents with   Loss of Consciousness    [name] is a 75 y.o. female with history of pulmonary embolism, breast cancer, TIA, and COPD who presents to the emergency department after syncopal event that occurred earlier today.  Patient states that all she remembers was pulling into the driveway and waking up underneath the car.  She does have a large SUV and states that she might have opened the door and slid out underneath the car.  The neighbor found her on the floor.  She denies any prodromal symptoms including chest pain, shortness of breath, diaphoresis, nausea, vomiting.  She states she is feeling back to her baseline but does report some associated neck pain which she rates moderate in severity.  She characterizes her neck pain as a sore sensation.  She denies any leg pain or leg swelling.   Loss of Consciousness     Past Medical History:  Diagnosis Date   Arthritis    Breast cancer ([place])    left breast/lumpectomy/chemo/rad 2003   COPD (chronic obstructive pulmonary disease) ([place])    DDD (degenerative disc disease), lumbar 08/31/2017   Diverticulitis    GERD (gastroesophageal reflux disease)    Hemoptysis    Hyperlipidemia    Iron deficiency    Macular degeneration    bilateral   Personal history of radiation therapy 2003   Pulmonary embolism ([place])    Tachycardia     Patient Active Problem List   Diagnosis Date Noted   H/O: CVA (cerebrovascular accident) 11/01/2019   Bleeding from varicose vein 01/05/2019   Dizziness 01/03/2018   DDD (degenerative disc disease), lumbar 08/31/2017   Generalized OA 08/31/2017   Venous hypertension of both lower extremities 07/03/2017   Osteopenia 05/13/2016   History of left breast cancer 05/13/2016   Glaucoma 05/13/2016   Hyperlipidemia 05/13/2016   Urinary incontinence 05/13/2016    Tobacco use disorder 05/13/2016   GERD (gastroesophageal reflux disease) 06/27/2013   H/O: upper GI bleed 06/27/2013   Epigastric pain 06/27/2013   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[place]) 11/21/2010    Past Surgical History:  Procedure Laterality Date   BACK SURGERY     BREAST LUMPECTOMY     COLONOSCOPY  01/28/2012   Procedure: COLONOSCOPY;  Surgeon: Rogene Houston, MD;  Location: [facility];  Service: Endoscopy;  Laterality: N/A;  1200   COLONOSCOPY  01-2012   ESOPHAGOGASTRODUODENOSCOPY N/A 07/01/2013   Procedure: ESOPHAGOGASTRODUODENOSCOPY (EGD);  Surgeon: Rogene Houston, MD;  Location: [facility];  Service: Endoscopy;  Laterality: N/A;  1:10   LAPAROSCOPIC TUBAL LIGATION  1970   TONSILLECTOMY     Patient was age 10     OB History   No obstetric history on file.     Family History  Problem Relation Age of Onset   Heart disease Mother    Alzheimer's disease Mother    Heart disease Father    Lung disease Father    Healthy Son     Social History   Tobacco Use   Smoking status: Every Day    Packs/day: 2.00    Years: 30.00    Pack years: 60.00    Types: Cigarettes   Smokeless tobacco: Never   Tobacco comments:    05/17/18 1- 1.5 PPD  Substance Use Topics   Alcohol use: No    Alcohol/week: 0.0 standard  drinks   Drug use: No    Home Medications Prior to Admission medications   Medication Sig Start Date End Date Taking? Authorizing Provider  aspirin EC 325 MG tablet Take 1 tablet (325 mg total) by mouth daily. 05/13/18   Alycia Rossetti, MD  CALCIUM PO Take 1 tablet by mouth daily.    [provider]  cephALEXin (KEFLEX) 500 MG capsule Take 1 capsule (500 mg total) by mouth 2 (two) times daily. 12/15/19   Rhyne, Hulen Shouts, PA-C  Cholecalciferol (VITAMIN D PO) Take 1 tablet by mouth daily.    [provider]  diclofenac (VOLTAREN) 75 MG EC tablet Take 1 tablet (75 mg total) by mouth 2 (two) times daily. 03/07/19   [place], Modena Nunnery, MD  doxycycline  (VIBRAMYCIN) 100 MG capsule Take 1 capsule (100 mg total) by mouth 2 (two) times daily. 12/02/19   Setzer, Edman Circle, PA-C  Magnesium Oxide 400 MG CAPS Take 1 capsule daily 11/01/19   Alycia Rossetti, MD  Multiple Vitamin (MULTIVITAMIN WITH MINERALS) TABS tablet Take 1 tablet by mouth daily.    [provider]  Multiple Vitamins-Minerals (PRESERVISION AREDS 2) CAPS Take by mouth 2 (two) times daily.     [provider]  Omega-3 Fatty Acids (FISH OIL) 1000 MG CAPS Take by mouth daily.    [provider]  omeprazole (PRILOSEC) 10 MG capsule Take 10 mg by mouth daily.    [provider]  timolol (TIMOPTIC) 0.5 % ophthalmic solution Place 1 drop into the left eye daily.  01/23/17   [provider]  varenicline (CHANTIX STARTING MONTH PAK) 0.5 MG X 11 & 1 MG X 42 tablet Take 0.5 mg tablet by mouth 1x daily for 3 days, then increase to 0.5 mg tablet 2x daily for 4 days, then increase to 1 mg tablet 2x daily. 11/03/19   Alycia Rossetti, MD    Allergies    Sulfa antibiotics  Review of Systems   Review of Systems  Cardiovascular:  Positive for syncope.  All other systems reviewed and are negative.  Physical Exam Updated Vital Signs BP 140/66   Pulse 85   Temp 97.8 F (36.6 C) (Oral)   Resp 14   Ht 5\' 8"  (1.727 m)   Wt 68 kg   SpO2 96%   BMI 22.81 kg/m   Physical Exam Vitals and nursing note reviewed.  Constitutional:      General: She is not in acute distress.    Appearance: Normal appearance.  HENT:     Head: Normocephalic and atraumatic.  Eyes:     General:        Right eye: No discharge.        Left eye: No discharge.  Neck:     Comments: Well-healed surgical incision over the cervical spine.  No midline tenderness over the cervical, thoracic, or lumbar spine. Cardiovascular:     Comments: Regular rate and rhythm.  S1/S2 are distinct without any evidence of murmur, rubs, or gallops.  Radial pulses are 2+ bilaterally.  Dorsalis pedis  pulses are 2+ bilaterally.  No evidence of pedal edema. Pulmonary:     Comments: Clear to auscultation bilaterally.  Normal effort.  No respiratory distress.  No evidence of wheezes, rales, or rhonchi heard throughout. Abdominal:     General: Abdomen is flat. Bowel sounds are normal. There is no distension.     Tenderness: There is no abdominal tenderness. There is no guarding or rebound.  Musculoskeletal:        General: Normal range of motion.     Cervical back: Neck supple.  Skin:    General: Skin is warm and dry.     Findings: No rash.  Neurological:     General: No focal deficit present.     Mental Status: She is alert.  Psychiatric:        Mood and Affect: Mood normal.        Behavior: Behavior normal.    ED Results / Procedures / Treatments   Labs (all labs ordered are listed, but only abnormal results are displayed) Labs Reviewed  BASIC METABOLIC PANEL  CBC WITH DIFFERENTIAL/PLATELET  CBG MONITORING, ED  TROPONIN I (HIGH SENSITIVITY)  TROPONIN I (HIGH SENSITIVITY)    EKG None  Radiology CT ANGIO HEAD NECK W WO CM  Result Date: 03/27/2021 CLINICAL DATA:  Head trauma, vascular injury suspected EXAM: CT ANGIOGRAPHY HEAD AND NECK TECHNIQUE: Multidetector CT imaging of the head and neck was performed using the standard protocol during bolus administration of intravenous contrast. Multiplanar CT image reconstructions and MIPs were obtained to evaluate the vascular anatomy. Carotid stenosis measurements (when applicable) are obtained utilizing NASCET criteria, using the distal internal carotid diameter as the denominator. CONTRAST:  1mL OMNIPAQUE IOHEXOL 350 MG/ML SOLN COMPARISON:  CT head 01/26/2021, brain MRI 05/11/2018 FINDINGS: CT HEAD FINDINGS Brain: There is no evidence of acute intracranial hemorrhage, extra-axial fluid collection, or acute infarct. The ventricles are enlarged, out of proportion to the degree of parenchymal volume loss. The size and hand figure a shin  of the ventricular system is unchanged, with no new or worsening hydrocephalus. There is no mass lesion.  There is no midline shift. Vascular: No hyperdense vessel or unexpected calcification. Skull: Normal. Negative for fracture or focal lesion. Sinuses: The imaged paranasal sinuses are clear. Orbits: Bilateral lens implants are in place. The globes and orbits are otherwise unremarkable. Review of the MIP images confirms the above findings CTA NECK FINDINGS Aortic arch: There is mild calcified atherosclerotic plaque of the aortic arch and at the origin of the left subclavian artery without high-grade stenosis. The aortic arch is otherwise unremarkable. Right carotid system: There is mild soft and calcified atherosclerotic plaque in the right common carotid artery and proximal internal carotid artery without hemodynamically significant stenosis, occlusion, dissection, or aneurysm. Left carotid system: There is mild soft and calcified atherosclerotic plaque in the proximal left internal carotid artery without hemodynamically significant stenosis, occlusion, dissection, or aneurysm. Vertebral arteries: The vertebral arteries are patent, with no hemodynamically significant stenosis, occlusion, dissection, or aneurysm. Skeleton: There is mild degenerative change of the cervical spine. There is no acute osseous abnormality or aggressive osseous lesion. Other neck: The soft tissues are unremarkable. Upper chest: There is emphysema in the lung apices. There is prominent biapical pleural scarring. Review of the MIP images confirms the above findings CTA HEAD FINDINGS Anterior circulation: There is calcified atherosclerotic plaque of the bilateral intracranial ICAs without significant stenosis or occlusion. There is moderate focal stenosis of the left M1 segment (11-112). The distal left MCA branches are patent. The right MCA and distal branches are patent. There is no aneurysm. Posterior circulation: The bilateral V4  segments are patent. The basilar artery is patent. The bilateral PCAs are patent with mild irregularity distally. There is a fetal origin of the left PCA as well as a prominent right posterior communicating artery. There is no aneurysm. Venous sinuses: Patent. Anatomic variants: As above. Review of  the MIP images confirms the above findings IMPRESSION: 1. No acute intracranial pathology. 2. Unchanged enlargement of the ventricles in a configuration that can be seen with normal pressure hydrocephalus in the correct clinical setting. 3. Mild atherosclerotic plaque in the bilateral carotid bulbs without hemodynamically significant stenosis. No evidence of dissection in the neck. 4. Moderate focal stenosis in the left M1 segment. Otherwise, mild intracranial atherosclerotic disease without other high-grade stenosis or occlusion. Aortic Atherosclerosis (ICD10-I70.0) and Emphysema (ICD10-J43.9). Electronically Signed   By: Valetta Mole M.D.   On: 03/27/2021 14:12    Procedures Procedures   Medications Ordered in ED Medications  iohexol (OMNIPAQUE) 350 MG/ML injection 75 mL (75 mLs Intravenous Contrast Given 03/27/21 1334)    ED Course  I have reviewed the triage vital signs and the nursing notes.  Pertinent labs & imaging results that were available during my care of the patient were reviewed by me and considered in my medical decision making (see chart for details).  Clinical Course as of 03/27/21 1858  Wed Mar 27, 2021  1212 I discussed this case with my attending physician who cosigned this note including patient's presenting symptoms, physical exam, and planned diagnostics and interventions. Attending physician stated agreement with plan or made changes to plan which were implemented.   Attending physician assessed patient at bedside.   [CF]  1436 I spoke with the patient that I am still waiting for a call from neurology.  The patient is antsy and hungry and would like to leave.  I discussed that  I would like to talk with him before being discharged however they insist on leaving.  She has full capacity and medical decision making.  She is otherwise stable and safe for discharge. [CF]  1857 I paged neurology and was never called back.  I gave her follow-up with of our neurology for further evaluation. [CF]    Clinical Course User Index [CF] Cherrie Gauze   MDM Rules/Calculators/A&P                          [name] is a 75 y.o. female who presents to the emergency department after syncopal episode.  History and physical exam is somewhat concerning for neurally mediated syncope.  Not think this is coming from her heart/lungs and diagnoses such as arrhythmias, ACS, pulmonary embolism, or dissection.  Also low suspicion for heart failure.  CBC and CMP are unremarkable.  Initial and delta troponin were negative.  Glucose was normal.  CT angio head and neck did not reveal any acute abnormalities.  There is some carotid atherosclerosis and ventricle enlargement.  She has known normal pressure hydrocephalus.  Given the clinical scenario, she is safe for discharge.  I will have her follow-up with neurology.  I discussed with the patient that I was wanting to consult with neurology as this is an atypical presentation of syncope.  As mentioned above, the patient is and she To go home.  She has full mental capacity and appropriate medical decision-making.  I gave her follow-up with neurology and strict return precautions.  She is safe for discharge.   Final Clinical Impression(s) / ED Diagnoses Final diagnoses:  Syncope, unspecified syncope type    Rx / DC Orders ED Discharge Orders     None        Hendricks Limes, PA-C 03/27/21 1859    Lorelle Gibbs, DO 04/05/21 1635

## 2021-03-27 NOTE — Discharge Instructions (Signed)
You were seen and evaluated in the emergency department today for further evaluation of a syncopal episode.  As we discussed, your blood work was normal and your scans did not show anything emergent at this time.  I would like for you to follow-up with neurology as I think this is likely coming from the brain.  Please turn to the emergency department sooner if you experience another syncopal episode, trouble talking, trouble walking, weakness/numbness of your upper and lower extremities.

## 2021-03-27 NOTE — ED Triage Notes (Signed)
Pt stated she fell yesterday but unaware how she got out of car and onto her concrete driveway. Neighbor found pt on ground. Pt was wearing her glasses and her glasses were off her face. Pt last memory was her driving into the driveway.

## 2021-04-02 ENCOUNTER — Other Ambulatory Visit: Payer: Self-pay

## 2021-04-02 ENCOUNTER — Ambulatory Visit (INDEPENDENT_AMBULATORY_CARE_PROVIDER_SITE_OTHER): Payer: Medicare HMO | Admitting: Neurology

## 2021-04-02 ENCOUNTER — Encounter: Payer: Self-pay | Admitting: Neurology

## 2021-04-02 VITALS — BP 134/61 | HR 68 | Ht 68.0 in | Wt 152.2 lb

## 2021-04-02 DIAGNOSIS — G9389 Other specified disorders of brain: Secondary | ICD-10-CM

## 2021-04-02 DIAGNOSIS — R2681 Unsteadiness on feet: Secondary | ICD-10-CM | POA: Diagnosis not present

## 2021-04-02 DIAGNOSIS — R55 Syncope and collapse: Secondary | ICD-10-CM | POA: Diagnosis not present

## 2021-04-02 NOTE — Progress Notes (Signed)
NEUROLOGY CONSULTATION NOTE  [name] MRN: [id number] DOB: 01-12-1946  Referring provider: Dr. Lavenia Atlas (ER) Primary care provider: [facility]  Reason for consult:  syncope  Dear Dr Dina Rich:  Thank you for your kind referral of [name] for consultation of the above symptoms. Although her history is well known to you, please allow me to reiterate it for the purpose of our medical record. The patient was accompanied to the clinic by her husband who also provides collateral information. Records and images were personally reviewed where available.   HISTORY OF PRESENT ILLNESS: This is a 75 year old right-handed woman with a history of pulmonary embolism, breast cancer, TIA, COPD, presenting for evaluation of syncope last 03/27/2021. She was in her usual state of health and had driven home from the bank, she recalls turning into her driveway, then waking up under her car. She denies any warning symptoms, she woke up on her stomach with half her body under the car. She turned and saw the underside of the car, taking 10-15 minutes to figure out how she would get out. She reports the car was not running, her door was open with her pocketbook, cane, and glasses strewn around. She denied any focal weakness, no tongue bite or incontinence. Her neighbor saw her sitting on the ground and helped her up. She had some neck pain in the ER.  CBC, BMP normal. She had a CT head without contrast which I personally reviewed, no acute changes, ventricle enlarged, out of proportion to degree of parenchymal volume loss, no change from prior imaging in 12/2020. CTA head and neck showed mild atherosclerotic disease in both carotid bulbs, moderate focal stenosis in left M1. EKG showed NSR. She denies any prior episodes of loss of consciousness. She has had frequent falls. She had a bad fall in August where she hit her head and her right arm with "blood everywhere," she remembers everything  from that fall. There have been 2 more falls in between August and most recent one, both occurring when she gets up from a chair. She has been evaluated by 2 neurologists and neurosurgeons [facility] and [facility]) for normal pressure hydrocephalus, she was admitted at [facility] in 09/2019 for lumbar drain trial, 3-day trial was unsuccessful and she did not meet criteria for shunt. They note her balance has been bad or "maybe worse." She has to hold on before she starts moving with her cane. Her back and knee pain also make it hard to stand up. She reported dizziness at [facility], felt due to orthostatic hypotension. She thinks she is usually hydrated, her husband disagrees and states she mostly drinks green tea. She reports losing her train of thought midsentence, "my head feels crazy." Her husband denies any staring spells, but she feels there are times "I'm there but I'm not there." She has urinary incontinence and wears adult diapers. No family history of seizures.      PAST MEDICAL HISTORY: Past Medical History:  Diagnosis Date   Arthritis    Breast cancer ([place])    left breast/lumpectomy/chemo/rad 2003   COPD (chronic obstructive pulmonary disease) ([place])    DDD (degenerative disc disease), lumbar 08/31/2017   Diverticulitis    GERD (gastroesophageal reflux disease)    Hemoptysis    Hyperlipidemia    Iron deficiency    Macular degeneration    bilateral   Personal history of radiation therapy 2003   Pulmonary embolism ([facility])    Tachycardia  PAST SURGICAL HISTORY: Past Surgical History:  Procedure Laterality Date   BACK SURGERY     BREAST LUMPECTOMY     COLONOSCOPY  01/28/2012   Procedure: COLONOSCOPY;  Surgeon: Rogene Houston, MD;  [facility];  Service: Endoscopy;  Laterality: N/A;  1200   COLONOSCOPY  01-2012   ESOPHAGOGASTRODUODENOSCOPY N/A 07/01/2013   Procedure: ESOPHAGOGASTRODUODENOSCOPY (EGD);  Surgeon: Rogene Houston, MD;  Location[facility];  Service: Endoscopy;  Laterality: N/A;  1:10   LAPAROSCOPIC TUBAL LIGATION  1970   TONSILLECTOMY     Patient was age 57    MEDICATIONS: Current Outpatient Medications on File Prior to Visit  Medication Sig Dispense Refill   aspirin EC 325 MG tablet Take 1 tablet (325 mg total) by mouth daily. 30 tablet 0   CALCIUM PO Take 1 tablet by mouth daily.     cephALEXin (KEFLEX) 500 MG capsule Take 1 capsule (500 mg total) by mouth 2 (two) times daily. 14 capsule 0   Cholecalciferol (VITAMIN D PO) Take 1 tablet by mouth daily.     diclofenac (VOLTAREN) 75 MG EC tablet Take 1 tablet (75 mg total) by mouth 2 (two) times daily. 180 tablet 2   doxycycline (VIBRAMYCIN) 100 MG capsule Take 1 capsule (100 mg total) by mouth 2 (two) times daily. 28 capsule 0   Magnesium Oxide 400 MG CAPS Take 1 capsule daily  0   Multiple Vitamin (MULTIVITAMIN WITH MINERALS) TABS tablet Take 1 tablet by mouth daily.     Multiple Vitamins-Minerals (PRESERVISION AREDS 2) CAPS Take by mouth 2 (two) times daily.      Omega-3 Fatty Acids (FISH OIL) 1000 MG CAPS Take by mouth daily.     omeprazole (PRILOSEC) 10 MG capsule Take 10 mg by mouth daily.     timolol (TIMOPTIC) 0.5 % ophthalmic solution Place 1 drop into the left eye daily.      varenicline (CHANTIX STARTING MONTH PAK) 0.5 MG X 11 & 1 MG X 42 tablet Take 0.5 mg tablet by mouth 1x daily for 3 days, then increase to 0.5 mg tablet 2x daily for 4 days, then increase to 1 mg tablet 2x daily. 53 tablet 0   No current facility-administered medications on file prior to visit.    ALLERGIES: Allergies  Allergen Reactions   Sulfa Antibiotics Other (See Comments)    Childhood Allergy  05/17/18 patient denies    FAMILY HISTORY: Family History  Problem Relation Age of Onset   Heart disease Mother    Alzheimer's disease Mother    Heart disease Father    Lung disease Father    Healthy Son     SOCIAL HISTORY: Social History   Socioeconomic History   Marital  status: Married    Spouse name: Simona Huh   Number of children: 1   Years of education: some college   Highest education level: Not on file  Occupational History   Not on file  Tobacco Use   Smoking status: Every Day    Packs/day: 2.00    Years: 30.00    Pack years: 60.00    Types: Cigarettes   Smokeless tobacco: Never   Tobacco comments:    05/17/18 1- 1.5 PPD  Vaping Use   Vaping Use: Never used  Substance and Sexual Activity   Alcohol use: No    Alcohol/week: 0.0 standard drinks   Drug use: No   Sexual activity: Not Currently  Other Topics Concern   Not on  file  Social History Narrative   Lives with husband   Caffeine- none   Right handed    Social Determinants of Health   Financial Resource Strain: Not on file  Food Insecurity: Not on file  Transportation Needs: Not on file  Physical Activity: Not on file  Stress: Not on file  Social Connections: Not on file  Intimate Partner Violence: Not on file     PHYSICAL EXAM: Vitals:   04/02/21 1258  BP: 134/61  Pulse: 68  SpO2: 96%   General: No acute distress Head:  Normocephalic/atraumatic Skin/Extremities: No rash, no edema Neurological Exam: Mental status: alert and oriented to person, place, and time, no dysarthria or aphasia, Fund of knowledge is appropriate.  Recent and remote memory are intact, 3/3 delayed recall.  Attention and concentration are normal.     Cranial nerves: CN I: not tested CN II: pupils equal, round and reactive to light, visual fields intact CN III, IV, VI:  full range of motion, no nystagmus, no ptosis CN V: facial sensation intact CN VII: upper and lower face symmetric CN VIII: hearing intact to conversation Bulk & Tone: normal, no fasciculations. Motor: 5/5 throughout with no pronator drift. Sensation: intact to light touch, cold, pin, vibration sense.  No extinction to double simultaneous stimulation.  Deep Tendon Reflexes: +1 throughout Cerebellar: no incoordination on finger to  nose testing Gait: slow and cautious, unsteady with cane, with reduced step height Tremor: none   IMPRESSION: This is a 75 year old right-handed woman with a history of pulmonary embolism, breast cancer, TIA, COPD, presenting for evaluation of an episode of loss of awareness/consciousness last 03/27/2021. Etiology unclear, there were no prodromal symptoms. From a neurological standpoint, MRI brain with and without contrast and 1-hour EEG will be ordered. She will be referred to Cardiology as well for syncope evaluation. She has frequent falls with significant gait instability. Prior workup with Neurosurgery included lumbar drain trial for possible NPH, but there was no improvement and it was felt she was not a candidate for a drain. Recommend physical therapy for gait instability. [place] driving laws were discussed with the patient, and she knows to stop driving after an episode of loss of consciousness until 6 months event-free. Follow-up after tests, call for any changes.    Thank you for allowing me to participate in the care of this patient. Please do not hesitate to call for any questions or concerns.   Ellouise Newer, M.D.  CC: Dr. Dina Rich, [place]

## 2021-04-02 NOTE — Patient Instructions (Signed)
Schedule MRI brain with and without contrast  2. Schedule 1-hour EEG  3. Referral will be sent to Cardiology  4. Recommend physical therapy  5. Follow-up after tests, call for any changes

## 2021-04-12 ENCOUNTER — Inpatient Hospital Stay: Payer: Medicare HMO

## 2021-04-12 ENCOUNTER — Other Ambulatory Visit: Payer: Self-pay

## 2021-04-12 ENCOUNTER — Ambulatory Visit: Payer: Medicare HMO | Admitting: Cardiology

## 2021-04-12 ENCOUNTER — Encounter: Payer: Self-pay | Admitting: Cardiology

## 2021-04-12 VITALS — BP 130/64 | HR 81 | Temp 97.8°F | Resp 16 | Ht 68.0 in | Wt 152.8 lb

## 2021-04-12 DIAGNOSIS — F17200 Nicotine dependence, unspecified, uncomplicated: Secondary | ICD-10-CM

## 2021-04-12 DIAGNOSIS — I951 Orthostatic hypotension: Secondary | ICD-10-CM | POA: Diagnosis not present

## 2021-04-12 DIAGNOSIS — Z8673 Personal history of transient ischemic attack (TIA), and cerebral infarction without residual deficits: Secondary | ICD-10-CM

## 2021-04-12 DIAGNOSIS — R55 Syncope and collapse: Secondary | ICD-10-CM

## 2021-04-12 DIAGNOSIS — I491 Atrial premature depolarization: Secondary | ICD-10-CM | POA: Diagnosis not present

## 2021-04-12 NOTE — Patient Instructions (Signed)
Discussed with the ophthalmologist whether Timoptic can be switched over to a different eyedrops

## 2021-04-12 NOTE — Progress Notes (Signed)
Primary Physician/Referring:  Bonnita Hollow, MD  Patient ID: [name], female    DOB: 08/26/1945, 75 y.o.   MRN: [id number]  Chief Complaint  Patient presents with   Loss of Consciousness   HPI:    [name]  is a 75 y.o. 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  She is now referred to me for further evaluation of the same.  On 03/27/2021, patient had frank episodes of syncope on the driveway without any premonitory symptoms.  She was ruled out for PE and also any acute intracranial abnormality and discharged home.  She has not had any recurrence.  She denies chest pain or shortness of breath but states that she is markedly sedentary due to her orthopedic issues and gait instability from normal pressure hydrocephalus and chronic back pain.  Past Medical History:  Diagnosis Date   Arthritis    Breast cancer ([place])    left breast/lumpectomy/chemo/rad 2003   COPD (chronic obstructive pulmonary disease) ([place])    DDD (degenerative disc disease), lumbar 08/31/2017   Diverticulitis    GERD (gastroesophageal reflux disease)    Hemoptysis    Hyperlipidemia    Iron deficiency    Macular degeneration    bilateral   Personal history of radiation therapy 2003   Pulmonary embolism ([place])    Tachycardia    Past Surgical History:  Procedure Laterality Date   BACK SURGERY     BREAST LUMPECTOMY     COLONOSCOPY  01/28/2012   Procedure: COLONOSCOPY;  Surgeon: Rogene Houston, MD;  Location: [facility];  Service: Endoscopy;  Laterality: N/A;  1200   COLONOSCOPY  01-2012   ESOPHAGOGASTRODUODENOSCOPY N/A 07/01/2013   Procedure:  ESOPHAGOGASTRODUODENOSCOPY (EGD);  Surgeon: Rogene Houston, MD;  Location: [facility];  Service: Endoscopy;  Laterality: N/A;  1:10   LAPAROSCOPIC TUBAL LIGATION  1970   TONSILLECTOMY     Patient was age 75   Family History  Problem Relation Age of Onset   Heart disease Mother    Alzheimer's disease Mother    Heart disease Father    Lung disease Father    Healthy Son     Social History   Tobacco Use   Smoking status: Some Days    Packs/day: 0.50    Years: 30.00    Pack years: 15.00    Types: Cigarettes   Smokeless tobacco: Never   Tobacco comments:    05/17/18 1- 1.5 PPD  Substance Use Topics   Alcohol use: No    Alcohol/week: 0.0 standard drinks   Marital Status: Married  ROS  Review of Systems  Cardiovascular:  Negative for chest pain, dyspnea on exertion and leg swelling.  Musculoskeletal:  Positive for back pain (chronic).  Gastrointestinal:  Negative for melena.  Neurological:  Positive for dizziness and loss of balance.  Objective  Blood pressure 130/64, pulse 81, temperature 97.8 F (36.6 C), temperature source Temporal, resp. rate 16, height 5\' 8"  (1.727 m), weight 152 lb 12.8 oz (69.3 kg), SpO2 95 %. Body mass index is 23.23 kg/m.  Vitals with BMI 04/12/2021 04/02/2021 03/27/2021  Height 5\' 8"  5\' 8"  -  Weight 152 lbs 13  oz 152 lbs 3 oz -  BMI 50.09 38.18 -  Systolic 299 371 696  Diastolic 64 61 66  Pulse 81 68 85    Orthostatic VS for the past 72 hrs (Last 3 readings):  Orthostatic BP Patient Position BP Location Cuff Size Orthostatic Pulse  04/12/21 1115 112/52 Standing Left Arm Normal 79  04/12/21 1114 117/60 Sitting Left Arm Normal 85  04/12/21 1113 135/65 Supine Left Arm Normal 81     Physical Exam Neck:     Vascular: No carotid bruit or JVD.  Cardiovascular:     Rate and Rhythm: Normal rate and regular rhythm.     Pulses: Intact distal pulses.     Heart sounds: Normal heart sounds. No murmur heard.   No gallop.  Pulmonary:     Effort:  Pulmonary effort is normal.     Breath sounds: Normal breath sounds.  Abdominal:     General: Bowel sounds are normal.     Palpations: Abdomen is soft.  Musculoskeletal:     Right lower leg: Edema (trace ankle edema) present.     Left lower leg: Edema (trace ankle edema) present.     Laboratory examination:   Recent Labs    03/27/21 1218  NA 140  K 3.7  CL 103  CO2 29  GLUCOSE 91  BUN 13  CREATININE 0.57  CALCIUM 9.1  GFRNONAA >60   estimated creatinine clearance is 59.4 mL/min (by C-G formula based on SCr of 0.57 mg/dL).  CMP Latest Ref Rng & Units 03/27/2021 11/01/2019 01/05/2019  Glucose 70 - 99 mg/dL 91 105(H) 96  BUN 8 - 23 mg/dL 13 12 12   Creatinine 0.44 - 1.00 mg/dL 0.57 0.58(L) 0.61  Sodium 135 - 145 mmol/L 140 141 140  Potassium 3.5 - 5.1 mmol/L 3.7 5.2 5.1  Chloride 98 - 111 mmol/L 103 104 102  CO2 22 - 32 mmol/L 29 29 30   Calcium 8.9 - 10.3 mg/dL 9.1 10.0 9.5  Total Protein 6.1 - 8.1 g/dL - 6.8 6.6  Total Bilirubin 0.2 - 1.2 mg/dL - 0.5 0.3  Alkaline Phos 33 - 130 U/L - - -  AST 10 - 35 U/L - 19 19  ALT 6 - 29 U/L - 23 19   CBC Latest Ref Rng & Units 03/27/2021 11/01/2019 01/28/2019  WBC 4.0 - 10.5 K/uL 6.7 9.4 8.1  Hemoglobin 12.0 - 15.0 g/dL 12.5 11.2(L) 11.3(L)  Hematocrit 36.0 - 46.0 % 38.7 35.5 35.5  Platelets 150 - 400 K/uL 324 444(H) 413(H)    Lipid Panel No results for input(s): CHOL, TRIG, LDLCALC, VLDL, HDL, CHOLHDL, LDLDIRECT in the last 8760 hours. Lipid Panel     Component Value Date/Time   CHOL 186 11/01/2019 0909   TRIG 138 11/01/2019 0909   HDL 59 11/01/2019 0909   CHOLHDL 3.2 11/01/2019 0909   VLDL 24 07/03/2016 0957   LDLCALC 103 (H) 11/01/2019 0909     HEMOGLOBIN A1C No results found for: HGBA1C, MPG TSH No results for input(s): TSH in the last 8760 hours.  External labs:   Cholesterol, total 189.000 m 04/10/2020 HDL 63.000 mg 04/10/2020 LDL 105.000 M 04/10/2020 Triglycerides 119.000 m 04/10/2020  A1C 6.000 %  01/10/2020  Medications and allergies   Allergies  Allergen Reactions   Sulfa Antibiotics Other (See Comments)    Childhood Allergy  05/17/18 patient denies     Medication prior to this encounter:   Outpatient Medications Prior to Visit  Medication Sig Dispense Refill  aspirin EC 325 MG tablet Take 1 tablet (325 mg total) by mouth daily. 30 tablet 0   atorvastatin (LIPITOR) 10 MG tablet Take 10 mg by mouth daily.     CALCIUM PO Take 1 tablet by mouth daily.     Cholecalciferol (VITAMIN D PO) Take 1 tablet by mouth daily.     diclofenac (VOLTAREN) 75 MG EC tablet Take 1 tablet (75 mg total) by mouth 2 (two) times daily. 180 tablet 2   famotidine (PEPCID) 40 MG tablet Take 40 mg by mouth daily.     loratadine (ALLERGY) 10 MG tablet Take 10 mg by mouth daily.     Multiple Vitamin (MULTIVITAMIN WITH MINERALS) TABS tablet Take 1 tablet by mouth daily.     Multiple Vitamins-Minerals (PRESERVISION AREDS 2) CAPS Take by mouth 2 (two) times daily.      Omega-3 Fatty Acids (FISH OIL) 1000 MG CAPS Take by mouth daily.     timolol (TIMOPTIC) 0.5 % ophthalmic solution Place 1 drop into the left eye daily.      No facility-administered medications prior to visit.     Medication list after today's encounter   Current Outpatient Medications  Medication Instructions   aspirin EC 325 mg, Oral, Daily   atorvastatin (LIPITOR) 10 mg, Oral, Daily   CALCIUM PO 1 tablet, Daily   Cholecalciferol (VITAMIN D PO) 1 tablet, Daily   diclofenac (VOLTAREN) 75 mg, Oral, 2 times daily   famotidine (PEPCID) 40 mg, Oral, Daily   loratadine (ALLERGY) 10 mg, Oral, Daily   Multiple Vitamin (MULTIVITAMIN WITH MINERALS) TABS tablet 1 tablet, Oral, Daily   Multiple Vitamins-Minerals (PRESERVISION AREDS 2) CAPS Oral, 2 times daily   Omega-3 Fatty Acids (FISH OIL) 1000 MG CAPS Daily   timolol (TIMOPTIC) 0.5 % ophthalmic solution 1 drop, Left Eye, Daily    Radiology:   CT angiogram of the neck and head  05/27/2021: 1. No acute intracranial pathology. 2. Unchanged enlargement of the ventricles in a configuration that can be seen with normal pressure hydrocephalus in the correct clinical setting. 3. Mild atherosclerotic plaque in the bilateral carotid bulbs without hemodynamically significant stenosis. No evidence of dissection in the neck. 4. Moderate focal stenosis in the left M1 segment. Otherwise, mild intracranial atherosclerotic disease without other high-grade stenosis or occlusion.  Cardiac Studies:   Echocardiogram 06/01/2018: - Left ventricle: The cavity size was normal. Wall thickness was    normal. Systolic function was normal. The estimated ejection    fraction was in the range of 60% to 65%. Wall motion was normal;    there were no regional wall motion abnormalities. Doppler    parameters are consistent with abnormal left ventricular    relaxation (grade 1 diastolic dysfunction).  - Mitral valve: Calcified annulus. Valve area by continuity    equation (using LVOT flow): 4.51 cm^2.   Carotid artery duplex 05/11/2018: 1. Mild atherosclerotic disease in bilateral carotid arteries. 2. Estimated degree of stenosis in the right internal carotid artery is less than 50%. 3. Mildly elevated peak systolic velocity in the distal left internal artery could be related to tortuosity but cannot exclude stenosis measuring 50-69%. Recommend surveillance. 4. Patent vertebral arteries with antegrade flow.  EKG:   EKG 04/12/2021: Ectopic atrial rhythm at rate of 79 bpm, normal axis, incomplete right bundle branch block.  No evidence of ischemia, normal QT interval. Compared to 03/27/2021, ectopic atrial rhythm is new.    Assessment     ICD-10-CM   1. Syncope and  collapse  R55 EKG 12-Lead    [facility] ECHOCARDIOGRAM COMPLETE    LONG TERM MONITOR (3-14 DAYS)    [facility] MYOCARDIAL PERFUSION WITH LEXISCAN    2. 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 H/O: CVA (cerebrovascular accident)  Z19.73      4. Tobacco use disorder  F17.200     5. Orthostatic hypotension  I95.1       There are no discontinued medications.  No orders of the defined types were placed in this encounter.  Orders Placed This Encounter  Procedures   LONG TERM MONITOR (3-14 DAYS)    Standing Status:   Future    Standing Expiration Date:   04/12/2022    Order Specific Question:   Where should this test be performed?    Answer:   [facility]    Order Specific Question:   Does the patient have an implanted cardiac device?    Answer:   No    Order Specific Question:   Prescribed days of wear    Answer:   14   [facility] MYOCARDIAL PERFUSION WITH LEXISCAN    Standing Status:   Future    Standing Expiration Date:   06/12/2021   EKG 12-Lead   [facility] ECHOCARDIOGRAM COMPLETE    Standing Status:   Future    Standing Expiration Date:   04/12/2022   Recommendations:   [name] is a 75 y.o. 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  She is now referred to me for further evaluation of the same.  On 03/27/2021, patient had frank episodes of syncope on the driveway without any premonitory symptoms.  She was ruled out for PE and also any acute intracranial abnormality and discharged home.  In view of advanced age, abnormal rhythm with ectopic atrial rhythm today may suggest she may have sinus node dysfunction.  Her presentation was drastic without any premonitory symptoms again suggestive of cardiac etiology. Will schedule for an echocardiogram. Schedule for a Lexiscan Nuclear stress test to evaluate for myocardial ischemia. Patient unable to do treadmill stress testing  due to gait instability.  Extended EKG monitoring for 2 weeks.  I would like to see her back in 4 weeks for follow-up.  I suspect she will need a loop recorder implantation.  She is also orthostatic today which is mild, suspect this may be related to her age and also hydrocephalus.  I will recheck orthostatics on her next office visit.  If she is orthostatic, could consider addition of midodrine.  Do not suspect orthostasis explains her symptoms of syncope.  She has had TIA in the past and is presently on antihyperlipidemic therapy and also antiplatelet therapy, continue the same for now.  She is presently actively smoking.  She does not appear to be interested in smoking cessation.    Adrian Prows, MD, [facility] 04/12/2021, 12:19 PM Office: [phone]

## 2021-04-17 ENCOUNTER — Other Ambulatory Visit: Payer: Self-pay

## 2021-04-17 ENCOUNTER — Ambulatory Visit (INDEPENDENT_AMBULATORY_CARE_PROVIDER_SITE_OTHER): Payer: Medicare HMO | Admitting: Neurology

## 2021-04-17 DIAGNOSIS — R55 Syncope and collapse: Secondary | ICD-10-CM

## 2021-04-17 DIAGNOSIS — R2681 Unsteadiness on feet: Secondary | ICD-10-CM

## 2021-04-22 ENCOUNTER — Ambulatory Visit
Admission: RE | Admit: 2021-04-22 | Discharge: 2021-04-22 | Disposition: A | Discharge status: Home / Self Care | Payer: Medicare HMO | Source: Ambulatory Visit | Attending: Neurology | Admitting: Neurology

## 2021-04-22 ENCOUNTER — Other Ambulatory Visit: Payer: Self-pay

## 2021-04-22 DIAGNOSIS — R2681 Unsteadiness on feet: Secondary | ICD-10-CM

## 2021-04-22 DIAGNOSIS — R55 Syncope and collapse: Secondary | ICD-10-CM

## 2021-04-23 NOTE — Procedures (Signed)
ELECTROENCEPHALOGRAM REPORT  Date of Study: 04/17/2021  Patient's Name: [name] MRN: [id number] Date of Birth: 23-Oct-1945  Referring Provider: Dr. Ellouise Newer  Clinical History: This is a 75 year old woman with an episode of loss of awareness/consciousness. EEG for classification.  Medications: aspirin EC 325 MG tablet CALCIUM PO KEFLEX 500 MG capsule VITAMIN D PO VOLTAREN 75 MG EC tablet VIBRAMYCIN 100 MG capsule Magnesium Oxide 400 MG CAPS MULTIVITAMIN WITH MINERALS TABS tablet PRESERVISION AREDS 2 CAPS FISH OIL 1000 MG CAPS PRILOSEC 10 MG capsule TIMOPTIC 0.5 % ophthalmic solution CHANTIX STARTING MONTH PAK) 0.5 MG X 11 & 1 MG X 42 tablet  Technical Summary: A multichannel digital 1-hour EEG recording measured by the international 10-20 system with electrodes applied with paste and impedances below 5000 ohms performed in our laboratory with EKG monitoring in an awake and asleep patient.  Hyperventilation was not performed. Photic stimulation was performed.  The digital EEG was referentially recorded, reformatted, and digitally filtered in a variety of bipolar and referential montages for optimal display.    Description: The patient is awake and asleep during the recording.  During maximal wakefulness, there is a symmetric, medium voltage 10 Hz posterior dominant rhythm that attenuates with eye opening.  There is occasional focal theta slowing over the left temporal region. During drowsiness and sleep, there is an increase in theta slowing of the background with shifting asymmetry over the bilateral temporal regions.  Vertex waves and symmetric sleep spindles were seen.  Photic stimulation did not elicit any abnormalities.  There were no epileptiform discharges or electrographic seizures seen.    EKG lead was unremarkable.  Impression: This 1-hour awake and asleep EEG is abnormal due to occasional focal slowing over the left temporal region.  Clinical Correlation of the  above findings indicates focal cerebral dysfunction over the left temporal  region suggestive of underlying structural or physiologic abnormality. The absence of epileptiform discharges does not exclude a clinical diagnosis of epilepsy. Clinical correlation is advised.    Ellouise Newer, M.D.

## 2021-04-24 NOTE — Progress Notes (Signed)
Left detailed message on EEG normal.

## 2021-04-29 ENCOUNTER — Other Ambulatory Visit: Payer: Medicare HMO

## 2021-05-01 ENCOUNTER — Other Ambulatory Visit: Payer: Self-pay

## 2021-05-01 ENCOUNTER — Ambulatory Visit
Admission: RE | Admit: 2021-05-01 | Discharge: 2021-05-01 | Disposition: A | Discharge status: Home / Self Care | Payer: Medicare HMO | Source: Ambulatory Visit | Attending: Neurology | Admitting: Neurology

## 2021-05-01 DIAGNOSIS — I6381 Other cerebral infarction due to occlusion or stenosis of small artery: Secondary | ICD-10-CM | POA: Diagnosis not present

## 2021-05-01 DIAGNOSIS — H538 Other visual disturbances: Secondary | ICD-10-CM | POA: Diagnosis not present

## 2021-05-01 DIAGNOSIS — R55 Syncope and collapse: Secondary | ICD-10-CM | POA: Diagnosis not present

## 2021-05-01 MED ORDER — GADOBENATE DIMEGLUMINE 529 MG/ML IV SOLN
14.0000 mL | Freq: Once | INTRAVENOUS | Status: AC | PRN
  Administered 2021-05-01: 14 mL via INTRAVENOUS

## 2021-05-02 ENCOUNTER — Telehealth: Payer: Self-pay | Admitting: Neurology

## 2021-05-02 DIAGNOSIS — R55 Syncope and collapse: Secondary | ICD-10-CM | POA: Diagnosis not present

## 2021-05-02 NOTE — Telephone Encounter (Signed)
Pt called no answer left a voice mail to call the office back  °

## 2021-05-02 NOTE — Telephone Encounter (Signed)
Patient called and left a message returning a call to [facility].

## 2021-05-02 NOTE — Telephone Encounter (Signed)
Please let me know when  you speak with patient to see if she has no new one sided weakness/numbness or swallow trouble and I will be happy to get her schedule with Clarise Cruz

## 2021-05-02 NOTE — Telephone Encounter (Signed)
Pt is sch for tomorrow 05-03-21 with Clarise Cruz

## 2021-05-02 NOTE — Progress Notes (Signed)
Brief atrial tachycardia, no atrial fibrillation and will discuss on OV soon

## 2021-05-02 NOTE — Telephone Encounter (Signed)
Pt called an informed per Dr Tat that there is evidence of a small new stroke on her MRI.  Make sure she has no new one sided weakness/numbness/swallow trouble? There is no trouble with weakness/numbness/swallow  pt advised to  Continue ASA. Pt was transferred to the front to get scheduled with Clarise Cruz tomorrow for next steps

## 2021-05-02 NOTE — Telephone Encounter (Signed)
Let pt know that there is evidence of a small new stroke on her MRI.  Make sure she has no new one sided weakness/numbness/swallow trouble.  If not, please put on schedule with sara tomorrow to discuss next steps.  Continue ASA

## 2021-05-02 NOTE — Telephone Encounter (Signed)
Pt called in and left a message returning our call

## 2021-05-03 ENCOUNTER — Ambulatory Visit: Payer: Medicare HMO | Admitting: Physician Assistant

## 2021-05-06 ENCOUNTER — Ambulatory Visit: Payer: Medicare HMO

## 2021-05-06 ENCOUNTER — Other Ambulatory Visit: Payer: Self-pay

## 2021-05-06 DIAGNOSIS — R55 Syncope and collapse: Secondary | ICD-10-CM | POA: Diagnosis not present

## 2021-05-06 LAB — PCV MYOCARDIAL PERFUSION WITH LEXISCAN
Base ST Depression (mm): 0 mm
ST Depression (mm): 0 mm

## 2021-05-07 ENCOUNTER — Other Ambulatory Visit: Payer: Medicare HMO

## 2021-05-07 NOTE — Progress Notes (Signed)
Normal stress test, will discuss at office visit soon.

## 2021-05-08 ENCOUNTER — Other Ambulatory Visit: Payer: Self-pay

## 2021-05-08 ENCOUNTER — Ambulatory Visit: Payer: Medicare HMO | Admitting: Cardiology

## 2021-05-08 ENCOUNTER — Encounter: Payer: Self-pay | Admitting: Physician Assistant

## 2021-05-08 ENCOUNTER — Ambulatory Visit (INDEPENDENT_AMBULATORY_CARE_PROVIDER_SITE_OTHER): Payer: Medicare HMO | Admitting: Physician Assistant

## 2021-05-08 VITALS — BP 139/80 | HR 88 | Resp 18 | Ht 68.0 in | Wt 154.0 lb

## 2021-05-08 DIAGNOSIS — G9389 Other specified disorders of brain: Secondary | ICD-10-CM | POA: Diagnosis not present

## 2021-05-08 DIAGNOSIS — R55 Syncope and collapse: Secondary | ICD-10-CM | POA: Diagnosis not present

## 2021-05-08 DIAGNOSIS — R2681 Unsteadiness on feet: Secondary | ICD-10-CM | POA: Diagnosis not present

## 2021-05-08 NOTE — Progress Notes (Signed)
NEUROLOGY FOLLOW UP OFFICE NOTE  [name] [id number]  Assessment/Plan:   History of syncope with gait instability, etiology unclear NPH   Prior work-up with neurosurgery included a lumbar drain trial, for possible NPH, but there was no improvement, and it was felt that she was not a candidate for a drain.  At the time, MRI of the brain with and without contrast showed evidence of small subacute white matter lacunar infarct in the right centrum semiovale, without associated hemorrhage or mass-effect.  Elsewhere, there was stable chronic white matter signal changes and multiple small chronic infarcts in the cerebellum.  In addition, a chronic L third ventriculomegaly was noted again, but is stable since 2019.  CT a head and neck showed mild atherosclerotic disease in both carotid bulbs, and moderate focal stenosis in the left M1.  1 hour EEG was without seizure.  She was seen by cardiology, Dr. Einar Gip, at which time EKG showed ectopic atrial rhythm, and incomplete right BBB, without ischemia, and normal QT interval.  Other work-up is ongoing to evaluate for sinus node dysfunction, including Lexiscan nuclear test, and echo.  Recommend physical therapy for gait instability, patient is to proceed with this after all her studies are completed  [place] driving laws were discussed with the patient, and she knows to stop driving after an episode of loss of consciousness until 6 months event-free.  MMSE was 30/30, no apparent dementia is noted at this time.  Patient was instructed that if her memory worsens, we can make an appointment for further evaluation and management. Follow-up with cardiology regarding further work-up to evaluate for sinus node dysfunction Follow-up in 6 months, sooner if needed   Subjective:   This is a very pleasant 75 year old right-handed woman with a history of PE, breast cancer, TIA, COPD, initially seen on evaluation on 03/27/2021 after an episode of syncope.  The etiology  was unclear, and there were no prodromal symptoms.Since her last visit, the patient still has gait instability, especially when trying to get up from her chair.  Her husband notices that she continues to have slow steps, and takes her some time to turn around.  She has to hold onto objects to move when she does not have her cane.  She also reports pain due to arthritis in her knees.  She denies any dizziness today.  She admits to not drinking enough plain water, because "makes me nauseous ".  She does drink green tea.  She has urine incontinence, and wears adult diapers.  She denies any headaches.  She has reported some "memory issues ", but her husband denies that this is a problem. Denies any vision changes, dysarthria or dysphagia. Denies any unilateral weakness or sensory deficiencies. Denies any falls since her last visit. Denies any confusion. No seizures reported. Denies any chest pain, palpitations, or shortness of breath. Denies any fever or chills,diaphoresis, or flushing. Denies any abdominal pain, or diarrhea. Denies any sick contacts or new foods.  Denies lower extremity swelling.  Denies abnormal skin rashes, or neuropathy.  Compliant with medications.  She is not driving currently  HISTORY OF PRESENT ILLNESS: This is a 75 year old right-handed woman with a history of pulmonary embolism, breast cancer, TIA, COPD, presenting for evaluation of syncope last 03/27/2021. She was in her usual state of health and had driven home from the bank, she recalls turning into her driveway, then waking up under her car. She denies any warning symptoms, she woke up on her stomach  with half her body under the car. She turned and saw the underside of the car, taking 10-15 minutes to figure out how she would get out. She reports the car was not running, her door was open with her pocketbook, cane, and glasses strewn around. She denied any focal weakness, no tongue bite or incontinence. Her neighbor saw her sitting on  the ground and helped her up. She had some neck pain in the ER.  CBC, BMP normal. She had a CT head without contrast which I personally reviewed, no acute changes, ventricle enlarged, out of proportion to degree of parenchymal volume loss, no change from prior imaging in 12/2020. CTA head and neck showed mild atherosclerotic disease in both carotid bulbs, moderate focal stenosis in left M1. EKG showed NSR. She denies any prior episodes of loss of consciousness. She has had frequent falls. She had a bad fall in August where she hit her head and her right arm with "blood everywhere," she remembers everything from that fall. There have been 2 more falls in between August and most recent one, both occurring when she gets up from a chair. She has been evaluated by 2 neurologists and neurosurgeons [facility] and [facility]) for normal pressure hydrocephalus, she was admitted at [facility] in 09/2019 for lumbar drain trial, 3-day trial was unsuccessful and she did not meet criteria for shunt. They note her balance has been bad or "maybe worse." She has to hold on before she starts moving with her cane. Her back and knee pain also make it hard to stand up. She reported dizziness at [facility], felt due to orthostatic hypotension. She thinks she is usually hydrated, her husband disagrees and states she mostly drinks green tea. She reports losing her train of thought midsentence, "my head feels crazy." Her husband denies any staring spells, but she feels there are times "I'm there but I'm not there." She has urinary incontinence and wears adult diapers. No family history of seizures.   She has had TIA in the past and is presently on antihyperlipidemic therapy and also antiplatelet therapy, continue the same for now.  She is presently actively smoking.  She does not appear to be interested in smoking cessation. ( Dr. Einar Gip)   BP 139/80   Pulse 88   Resp 18   Ht 5\' 8"  (1.727 m)   Wt 154 lb (69.9 kg)    SpO2 100%   BMI 23.42 kg/m    PAST MEDICAL HISTORY: Past Medical History:  Diagnosis Date   Arthritis    Breast cancer ([place])    left breast/lumpectomy/chemo/rad 2003   COPD (chronic obstructive pulmonary disease) (HCC)    DDD (degenerative disc disease), lumbar 08/31/2017   Diverticulitis    GERD (gastroesophageal reflux disease)    Hemoptysis    Hyperlipidemia    Iron deficiency    Macular degeneration    bilateral   Personal history of radiation therapy 2003   Pulmonary embolism (HCC)    Tachycardia     MEDICATIONS: Current Outpatient Medications on File Prior to Visit  Medication Sig Dispense Refill   aspirin EC 325 MG tablet Take 1 tablet (325 mg total) by mouth daily. 30 tablet 0   atorvastatin (LIPITOR) 10 MG tablet Take 10 mg by mouth daily.     CALCIUM PO Take 1 tablet by mouth daily.     Cholecalciferol (VITAMIN D PO) Take 1 tablet by mouth daily.     diclofenac (VOLTAREN) 75 MG EC tablet Take  1 tablet (75 mg total) by mouth 2 (two) times daily. 180 tablet 2   famotidine (PEPCID) 40 MG tablet Take 40 mg by mouth daily.     loratadine (ALLERGY) 10 MG tablet Take 10 mg by mouth daily.     Multiple Vitamin (MULTIVITAMIN WITH MINERALS) TABS tablet Take 1 tablet by mouth daily.     Multiple Vitamins-Minerals (PRESERVISION AREDS 2) CAPS Take by mouth 2 (two) times daily.      Omega-3 Fatty Acids (FISH OIL) 1000 MG CAPS Take by mouth daily.     timolol (TIMOPTIC) 0.5 % ophthalmic solution Place 1 drop into the left eye daily.      No current facility-administered medications on file prior to visit.    ALLERGIES: Allergies  Allergen Reactions   Sulfa Antibiotics Other (See Comments)    Childhood Allergy  05/17/18 patient denies    FAMILY HISTORY: Family History  Problem Relation Age of Onset   Heart disease Mother    Alzheimer's disease Mother    Heart disease Father    Lung disease Father    Healthy Son     Objective:   General: No acute distress.   Patient appears well  groomed.   Head:  Normocephalic/atraumatic Eyes:  Fundi examined but not visualized Neck: supple, no paraspinal tenderness, full range of motion Heart:  Regular rate and rhythm Lungs:  Clear to auscultation bilaterally Back: No paraspinal tenderness Neurological Exam: alert and oriented to person, place, and time. Attention span and concentration intact, recent and remote memory intact, fund of knowledge intact.  Speech fluent and not dysarthric, language intact.  CN II-XII intact. Bulk and tone normal, muscle strength 5/5 throughout.  Sensation to light touch, temperature and vibration intact.  Deep tendon reflexes 2+ throughout, toes downgoing.  Finger to nose and heel to shin testing intact.  Gait cautious and narrow, she does have mild en bloc turn, Romberg is positive, with minimal swaying, needs her cane or to hold an object while standing on upright position.    Sharene Butters, PA-C   CC: Bonnita Hollow, MD

## 2021-05-08 NOTE — Patient Instructions (Signed)
   4. Recommend physical therapy  5. Follow-up in 6 months

## 2021-05-10 NOTE — Progress Notes (Signed)
Essentially normal echocardiogram with mild abnormality, has appointment soon with me and will discuss.

## 2021-05-15 DIAGNOSIS — H353232 Exudative age-related macular degeneration, bilateral, with inactive choroidal neovascularization: Secondary | ICD-10-CM | POA: Diagnosis not present

## 2021-05-15 DIAGNOSIS — H35372 Puckering of macula, left eye: Secondary | ICD-10-CM | POA: Diagnosis not present

## 2021-05-17 ENCOUNTER — Other Ambulatory Visit: Payer: Self-pay

## 2021-05-17 ENCOUNTER — Ambulatory Visit: Payer: Medicare HMO | Admitting: Cardiology

## 2021-05-17 ENCOUNTER — Encounter: Payer: Self-pay | Admitting: Cardiology

## 2021-05-17 VITALS — BP 148/59 | HR 72 | Temp 98.2°F | Resp 17 | Ht 68.0 in | Wt 154.2 lb

## 2021-05-17 DIAGNOSIS — I491 Atrial premature depolarization: Secondary | ICD-10-CM | POA: Diagnosis not present

## 2021-05-17 DIAGNOSIS — I6523 Occlusion and stenosis of bilateral carotid arteries: Secondary | ICD-10-CM

## 2021-05-17 DIAGNOSIS — Z8673 Personal history of transient ischemic attack (TIA), and cerebral infarction without residual deficits: Secondary | ICD-10-CM | POA: Diagnosis not present

## 2021-05-17 DIAGNOSIS — F17200 Nicotine dependence, unspecified, uncomplicated: Secondary | ICD-10-CM

## 2021-05-17 DIAGNOSIS — R55 Syncope and collapse: Secondary | ICD-10-CM | POA: Diagnosis not present

## 2021-05-17 HISTORY — DX: Syncope and collapse: R55

## 2021-05-17 MED ORDER — CLOPIDOGREL BISULFATE 75 MG PO TABS: 75.0000 mg | ORAL_TABLET | Freq: Every day | ORAL | 3 refills | Status: DC

## 2021-05-17 MED ORDER — ATORVASTATIN CALCIUM 20 MG PO TABS: 10.0000 mg | ORAL_TABLET | Freq: Every day | ORAL | 3 refills | Status: DC

## 2021-05-17 NOTE — Progress Notes (Signed)
Primary Physician/Referring:  Bonnita Hollow, MD  Patient ID: [name], female    DOB: Apr 08, 1946, 75 y.o.   MRN: [id number]  Chief Complaint  Patient presents with   Loss of Consciousness   ORTHOSTATIC HYPOTENSION   Abnormal ECG   HPI:    [name]  is a 75 y.o. 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  She is now referred to me for further evaluation of the same.  I had last seen her 6 weeks ago.  She quit smoking on 05/10/2021.  No new symptomatology and she has not had any further episodes of syncope.  On 03/27/2021, patient had frank episodes of syncope on the driveway without any premonitory symptoms. She denies chest pain or shortness of breath but states that she is markedly sedentary due to her orthopedic issues and gait instability from normal pressure hydrocephalus and chronic back pain.  Past Medical History:  Diagnosis Date   Arthritis    Breast cancer ([place])    left breast/lumpectomy/chemo/rad 2003   COPD (chronic obstructive pulmonary disease) ([place])    DDD (degenerative disc disease), lumbar 08/31/2017   Diverticulitis    GERD (gastroesophageal reflux disease)    Hemoptysis    Hyperlipidemia    Iron deficiency    Macular degeneration    bilateral   Personal history of radiation therapy 2003   Pulmonary embolism ([place])    Tachycardia    Past Surgical History:  Procedure Laterality Date   BACK SURGERY     BREAST LUMPECTOMY     COLONOSCOPY  01/28/2012   Procedure: COLONOSCOPY;  Surgeon: Rogene Houston, MD;  Location: [facility];   Service: Endoscopy;  Laterality: N/A;  1200   COLONOSCOPY  01-2012   ESOPHAGOGASTRODUODENOSCOPY N/A 07/01/2013   Procedure: ESOPHAGOGASTRODUODENOSCOPY (EGD);  Surgeon: Rogene Houston, MD;  Location: [facility];  Service: Endoscopy;  Laterality: N/A;  1:10   LAPAROSCOPIC TUBAL LIGATION  1970   TONSILLECTOMY     Patient was age 44   Family History  Problem Relation Age of Onset   Heart disease Mother    Alzheimer's disease Mother    Heart disease Father    Lung disease Father    Healthy Son     Social History   Tobacco Use   Smoking status: Some Days    Packs/day: 0.50    Years: 30.00    Pack years: 15.00    Types: Cigarettes   Smokeless tobacco: Never   Tobacco comments:    05/17/18 1- 1.5 PPD  Substance Use Topics   Alcohol use: No    Alcohol/week: 0.0 standard drinks   Marital Status: Married  ROS  Review of Systems  Cardiovascular:  Negative for chest pain, dyspnea on exertion and leg swelling.  Musculoskeletal:  Positive for back pain (chronic).  Gastrointestinal:  Negative for melena.  Neurological:  Positive for dizziness and loss of balance.  Objective  Blood pressure (!) 148/59, pulse 72, temperature 98.2 F (36.8 C), temperature source Temporal, resp. rate 17, height 5\' 8"  (1.727 m), weight 154 lb 3.2 oz (69.9 kg), SpO2  98 %. Body mass index is 23.45 kg/m.  Vitals with BMI 05/17/2021 05/08/2021 04/12/2021  Height 5\' 8"  5\' 8"  5\' 8"   Weight 154 lbs 3 oz 154 lbs 152 lbs 13 oz  BMI 23.45 15.17 61.60  Systolic 737 106 269  Diastolic 59 80 64  Pulse 72 88 81    Orthostatic VS for the past 72 hrs (Last 3 readings):  Orthostatic BP Patient Position BP Location Cuff Size Orthostatic Pulse  05/17/21 1410 157/60 Standing Left Arm Normal 75  05/17/21 1409 154/64 Sitting Left Arm Normal 62  05/17/21 1408 149/62 Supine Left Arm Normal 78    Physical Exam Neck:     Vascular: No carotid bruit or JVD.  Cardiovascular:     Rate and Rhythm: Normal rate and regular  rhythm.     Pulses: Intact distal pulses.     Heart sounds: Normal heart sounds. No murmur heard.   No gallop.  Pulmonary:     Effort: Pulmonary effort is normal.     Breath sounds: Normal breath sounds.  Abdominal:     General: Bowel sounds are normal.     Palpations: Abdomen is soft.  Musculoskeletal:     Right lower leg: Edema (trace ankle edema) present.     Left lower leg: Edema (trace ankle edema) present.     Laboratory examination:   Recent Labs    03/27/21 1218  NA 140  K 3.7  CL 103  CO2 29  GLUCOSE 91  BUN 13  CREATININE 0.57  CALCIUM 9.1  GFRNONAA >60   CrCl cannot be calculated (Patient's most recent lab result is older than the maximum 21 days allowed.).  CMP Latest Ref Rng & Units 03/27/2021 11/01/2019 01/05/2019  Glucose 70 - 99 mg/dL 91 105(H) 96  BUN 8 - 23 mg/dL 13 12 12   Creatinine 0.44 - 1.00 mg/dL 0.57 0.58(L) 0.61  Sodium 135 - 145 mmol/L 140 141 140  Potassium 3.5 - 5.1 mmol/L 3.7 5.2 5.1  Chloride 98 - 111 mmol/L 103 104 102  CO2 22 - 32 mmol/L 29 29 30   Calcium 8.9 - 10.3 mg/dL 9.1 10.0 9.5  Total Protein 6.1 - 8.1 g/dL - 6.8 6.6  Total Bilirubin 0.2 - 1.2 mg/dL - 0.5 0.3  Alkaline Phos 33 - 130 U/L - - -  AST 10 - 35 U/L - 19 19  ALT 6 - 29 U/L - 23 19   CBC Latest Ref Rng & Units 03/27/2021 11/01/2019 01/28/2019  WBC 4.0 - 10.5 K/uL 6.7 9.4 8.1  Hemoglobin 12.0 - 15.0 g/dL 12.5 11.2(L) 11.3(L)  Hematocrit 36.0 - 46.0 % 38.7 35.5 35.5  Platelets 150 - 400 K/uL 324 444(H) 413(H)    Lipid Panel No results for input(s): CHOL, TRIG, LDLCALC, VLDL, HDL, CHOLHDL, LDLDIRECT in the last 8760 hours. Lipid Panel     Component Value Date/Time   CHOL 186 11/01/2019 0909   TRIG 138 11/01/2019 0909   HDL 59 11/01/2019 0909   CHOLHDL 3.2 11/01/2019 0909   VLDL 24 07/03/2016 0957   LDLCALC 103 (H) 11/01/2019 0909     HEMOGLOBIN A1C No results found for: HGBA1C, MPG TSH No results for input(s): TSH in the last 8760 hours.  External labs:    Cholesterol, total 189.000 m 04/10/2020 HDL 63.000 mg 04/10/2020 LDL 105.000 M 04/10/2020 Triglycerides 119.000 m 04/10/2020  A1C 6.000 % 01/10/2020  Medications and allergies   Allergies  Allergen Reactions   Sulfa Antibiotics Other (See Comments)  Childhood Allergy  05/17/18 patient denies     Medication prior to this encounter:   Outpatient Medications Prior to Visit  Medication Sig Dispense Refill   CALCIUM PO Take 1 tablet by mouth daily.     Cholecalciferol (VITAMIN D PO) Take 1 tablet by mouth daily.     diclofenac (VOLTAREN) 75 MG EC tablet Take 1 tablet (75 mg total) by mouth 2 (two) times daily. 180 tablet 2   famotidine (PEPCID) 40 MG tablet Take 40 mg by mouth daily.     loratadine (CLARITIN) 10 MG tablet Take 10 mg by mouth daily.     Multiple Vitamin (MULTIVITAMIN WITH MINERALS) TABS tablet Take 1 tablet by mouth daily.     Multiple Vitamins-Minerals (PRESERVISION AREDS 2) CAPS Take by mouth 2 (two) times daily.      Omega-3 Fatty Acids (FISH OIL) 1000 MG CAPS Take by mouth daily.     timolol (TIMOPTIC) 0.5 % ophthalmic solution Place 1 drop into the left eye daily.      aspirin EC 325 MG tablet Take 1 tablet (325 mg total) by mouth daily. 30 tablet 0   atorvastatin (LIPITOR) 10 MG tablet Take 10 mg by mouth daily.     No facility-administered medications prior to visit.     Medication list after today's encounter   Current Outpatient Medications  Medication Instructions   atorvastatin (LIPITOR) 10 mg, Oral, Daily   CALCIUM PO 1 tablet, Daily   Cholecalciferol (VITAMIN D PO) 1 tablet, Daily   clopidogrel (PLAVIX) 75 mg, Oral, Daily   diclofenac (VOLTAREN) 75 mg, Oral, 2 times daily   famotidine (PEPCID) 40 mg, Oral, Daily   loratadine (CLARITIN) 10 mg, Oral, Daily   Multiple Vitamin (MULTIVITAMIN WITH MINERALS) TABS tablet 1 tablet, Oral, Daily   Multiple Vitamins-Minerals (PRESERVISION AREDS 2) CAPS Oral, 2 times daily   Omega-3 Fatty Acids (FISH OIL)  1000 MG CAPS Daily   timolol (TIMOPTIC) 0.5 % ophthalmic solution 1 drop, Left Eye, Daily    Radiology:   MRI 05/15/2018: 1. Punctate acute to early subacute left cerebellar infarct. 2. Multiple tiny chronic bilateral cerebellar infarcts. 3. Mild chronic small vessel ischemic disease in the cerebral white matter. 4. Moderate lateral and third ventriculomegaly which could reflect normal pressure hydrocephalus in the appropriate clinical setting.  CT angiogram of the neck and head 05/27/2021: 1. No acute intracranial pathology. 2. Unchanged enlargement of the ventricles in a configuration that can be seen with normal pressure hydrocephalus in the correct clinical setting. 3. Mild atherosclerotic plaque in the bilateral carotid bulbs without hemodynamically significant stenosis. No evidence of dissection in the neck. 4. Moderate focal stenosis in the left M1 segment. Otherwise, mild intracranial atherosclerotic disease without other high-grade stenosis or occlusion.  MRI Brain 05/01/2021: 1. Evidence of a small subacute white matter lacunar infarct in the right centrum semiovale. No associated hemorrhage or mass effect. Elsewhere stable chronic white matter signal changes and multiple small chronic infarcts in the cerebellum.   2. Chronic lateral and 3rd ventriculomegaly, stable since 2019. Evidence of hyperdynamic flow at the cerebral aqueduct argues against congenital aqueductal stenosis. And by report, previous workup for NPH includes a negative three day lumbar drainage trial last year.  Cardiac Studies:   Carotid artery duplex May 15, 2018: 1. Mild atherosclerotic disease in bilateral carotid arteries. 2. Estimated degree of stenosis in the right internal carotid artery is less than 50%. 3. Mildly elevated peak systolic velocity in the distal left internal artery could be related  to tortuosity but cannot exclude stenosis measuring 50-69%. Recommend surveillance. 4. Patent  vertebral arteries with antegrade flow.  PCV ECHOCARDIOGRAM COMPLETE 05/06/2021 Left ventricle cavity is normal in size and wall thickness. Normal global wall motion. Normal LV systolic function with visual EF 64%. Doppler evidence of grade I (impaired) diastolic dysfunction, normal LAP. Left atrial cavity is moderately dilated. Mild mitral valve leaflet thickening. No evidence of mitral stenosis. Mild (Grade I) mitral regurgitation. Normal right atrial pressure. Compared to previous study in 2019, moderate LA dilatation is new finding. No other significant change noted.    PCV MYOCARDIAL PERFUSION WITH LEXISCAN 05/06/2021 Nondiagnostic ECG stress. The heart rate response was consistent with Lexiscan.  The blood pressure response was physiologic. Breast attenuation artifact in the mid to distal anterior and septal region without ischemia.  Normal myocardial perfusion. Overall LV systolic function is normal without regional wall motion abnormalities. Stress LV EF: 64%. No previous exam available for comparison. Low risk.  Zio Patch Extended out patient EKG monitoring 14 days starting 04/12/2021: Predominant rhythm is normal sinus rhythm.  Minimum heart rate 39 bpm, maximum heart rate 184 bpm.  There were 613 brief atrial tachycardia episodes, longest for 26 seconds.  Isolated PACs and PVCs were noted.  Rare ventricular ectopics.  There were no patient triggered events, no symptoms reported. There were no heart block, no atrial fibrillation.  EKG:   EKG 05/17/2021: Ectopic atrial rhythm at the rate of 70 bpm, prominent R in V2 and V3, RVH.  Pulmonary disease pattern. No significant change from EKG 04/12/2021. Compared to 03/27/2021, ectopic atrial rhythm is new.    Assessment     ICD-10-CM   1. Syncope and collapse  R55 EKG 12-Lead    2. 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 Tobacco use disorder  F17.200     4. Asymptomatic bilateral carotid artery stenosis  I65.23 PCV CAROTID DUPLEX  (BILATERAL)    atorvastatin (LIPITOR) 20 MG tablet    CBC    Lipid Panel With LDL/HDL Ratio    5. 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  New 05/01/2021 MRI right centrum semiovalle  Z86.73       Medications Discontinued During This Encounter  Medication Reason   aspirin EC 325 MG tablet Ineffective   atorvastatin (LIPITOR) 10 MG tablet Reorder    Meds ordered this encounter  Medications   clopidogrel (PLAVIX) 75 MG tablet    Sig: Take 1 tablet (75 mg total) by mouth daily.    Dispense:  90 tablet    Refill:  3   atorvastatin (LIPITOR) 20 MG tablet    Sig: Take 0.5 tablets (10 mg total) by mouth daily.    Dispense:  90 tablet    Refill:  3    Orders Placed This Encounter  Procedures   CBC   Lipid Panel With LDL/HDL Ratio   EKG 12-Lead   Recommendations:   KIMBERLYN QUIOCHO is a 75 y.o. 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  She is now referred to me for further evaluation of the same.  I  had last seen her 6 weeks ago.  She quit smoking on 05/10/2021.  No new symptomatology and she has not had any further episodes of syncope.  I reviewed her echocardiogram and also nuclear stress test and reassured her it is nonischemic and LV function is normal.  However her syncope occurred without any premonitory symptoms, she has underlying sinus node dysfunction with ectopic atrial rhythm, brief atrial tachycardia probably related to underlying COPD.  She has also undergone recent MRI, on 05/01/2021 which showed a new infarct  and also previously MRI in 2019 that revealed tiny bilateral cerebellar infarct and a subacute left cerebellar infarct.  All this suggests that she probably may have paroxysmal atrial fibrillation also.  In view of this, I have recommended changing aspirin to Plavix in view of new stroke and also to evaluate her for atrial fibrillation and sinus node dysfunction, I have recommended loop recorder implantation.  Reviewed her LDL again with the patient, in view of carotid atherosclerosis and stroke, would like to have LDL goal closer to 70 if possible, will increase Lipitor from 10 mg to 20 mg daily.  I would like to obtain a carotid artery duplex to correlate with her CT angiogram report for stenosis severity in the bilateral carotid atherosclerotic lesions.  Blood pressure is well controlled, she has quit smoking and I have congratulated her on this.  I spent >40 minutes in discussions regarding her MRI, discussions regarding stroke prevention, discussions regarding loop implantation and reasons for documentation and sick sinus syndrome.  Patient is willing to proceed with loop implantation.  I will see her back in 3 months for follow-up.     Adrian Prows, MD, [facility] 05/17/2021, 6:30 PM Office: [phone]

## 2021-06-12 ENCOUNTER — Ambulatory Visit: Payer: Medicare HMO

## 2021-06-12 ENCOUNTER — Other Ambulatory Visit: Payer: Self-pay

## 2021-06-12 DIAGNOSIS — I6523 Occlusion and stenosis of bilateral carotid arteries: Secondary | ICD-10-CM | POA: Diagnosis not present

## 2021-06-13 ENCOUNTER — Encounter (HOSPITAL_COMMUNITY): Payer: Self-pay | Admitting: Physical Therapy

## 2021-06-13 ENCOUNTER — Ambulatory Visit (HOSPITAL_COMMUNITY): Payer: Medicare HMO | Attending: Neurology | Admitting: Physical Therapy

## 2021-06-13 DIAGNOSIS — R2681 Unsteadiness on feet: Secondary | ICD-10-CM | POA: Insufficient documentation

## 2021-06-13 DIAGNOSIS — R2689 Other abnormalities of gait and mobility: Secondary | ICD-10-CM | POA: Diagnosis not present

## 2021-06-13 DIAGNOSIS — J449 Chronic obstructive pulmonary disease, unspecified: Secondary | ICD-10-CM | POA: Diagnosis not present

## 2021-06-13 DIAGNOSIS — M6281 Muscle weakness (generalized): Secondary | ICD-10-CM | POA: Insufficient documentation

## 2021-06-13 NOTE — Therapy (Signed)
[facility] [place], [place], [address] Phone: [phone]   Fax:  [phone]  Physical Therapy Evaluation  Patient Details  Name: [name] MRN: [id number] Date of Birth: 03/12/1946 Referring Provider (PT): Cameron Sprang, MD   Encounter Date: 06/13/2021   PT End of Session - 06/13/21 1500     Visit Number 1    Number of Visits 12    Date for PT Re-Evaluation 07/25/21    Authorization Type Primary: [employer](Auth required) // Secondary: [employer](Follows medicare guidelines)    Authorization Time Period 12 visits requested - check auth    Authorization - Visit Number 1    Authorization - Number of Visits 12    Progress Note Due on Visit 10    PT Start Time 1406    PT Stop Time 1093    PT Time Calculation (min) 47 min    Equipment Utilized During Treatment Gait belt    Activity Tolerance Patient tolerated treatment well    Behavior During Therapy [facility] for tasks assessed/performed             Past Medical History:  Diagnosis Date   Arthritis    Breast cancer ([place])    left breast/lumpectomy/chemo/rad 2003   COPD (chronic obstructive pulmonary disease) ([place])    DDD (degenerative disc disease), lumbar 08/31/2017   Diverticulitis    GERD (gastroesophageal reflux disease)    Hemoptysis    Hyperlipidemia    Iron deficiency    Macular degeneration    bilateral   Personal history of radiation therapy 2003   Pulmonary embolism ([place])    Tachycardia     Past Surgical History:  Procedure Laterality Date   BACK SURGERY     BREAST LUMPECTOMY     COLONOSCOPY  01/28/2012   Procedure: COLONOSCOPY;  Surgeon: Rogene Houston, MD;  Location: [facility];  Service: Endoscopy;  Laterality: N/A;  1200   COLONOSCOPY  01-2012   ESOPHAGOGASTRODUODENOSCOPY N/A 07/01/2013   Procedure: ESOPHAGOGASTRODUODENOSCOPY (EGD);  Surgeon: Rogene Houston, MD;  Location: [facility];  Service: Endoscopy;  Laterality:  N/A;  1:10   LAPAROSCOPIC TUBAL LIGATION  1970   TONSILLECTOMY     Patient was age 76    There were no vitals filed for this visit.    Subjective Assessment - 06/13/21 1409     Subjective Patient reports that these symptoms started ~3 years ago and has slowly been getting worse. She states she falls frequenctly, but she does not give a specific number of falls. She lives in a single level home with 3-4 steps with a single hand rail to enter the home. She states that she tends not to use a cane or walker in the house as there are objects she is able to hold onto while wlaking that prevent her from falling.    Pertinent History Normal pressure hydrocephalus and frequent falls    Limitations Standing;Walking;House hold activities    Patient Stated Goals Improved balance. She would like to be able to walk around inside her house with indpendence.    Currently in Pain? No/denies                [facility] PT Assessment - 06/13/21 0001       Assessment   Medical Diagnosis Gait instability    Referring Provider (PT) Cameron Sprang, MD    Onset Date/Surgical Date 06/13/18    Next MD Visit unsure  Prior Therapy has attended this facility before for prior, but unrelated issues      Precautions   Precautions Fall      Restrictions   Weight Bearing Restrictions No      Balance Screen   Has the patient fallen in the past 6 months Yes    How many times? 2    Has the patient had a decrease in activity level because of a fear of falling?  Yes    Is the patient reluctant to leave their home because of a fear of falling?  Yes      [place] residence      Prior Function   Level of Independence Needs assistance with gait    Vocation Retired      Associate Professor   Overall Cognitive Status Within Functional Limits for tasks assessed    Memory Impaired      Observation/Other Assessments   Observations Extremely unsteady gait with use of SPC    Focus on  Therapeutic Outcomes (FOTO)  n/a      Posture/Postural Control   Posture/Postural Control Postural limitations   Forward head posture with excessive thoracic kyphosis   Postural Limitations Forward head;Rounded Shoulders;Increased thoracic kyphosis      ROM / Strength   AROM / PROM / Strength Strength      Strength   Strength Assessment Site Hip;Knee;Ankle    Right/Left Hip Right;Left    Right Hip Flexion 4/5    Right Hip ABduction 3+/5    Right Hip ADduction 4+/5    Left Hip Flexion 4-/5    Left Hip ABduction 3+/5    Left Hip ADduction 4+/5    Right/Left Knee Right;Left    Right Knee Flexion 4/5    Right Knee Extension 4/5    Left Knee Flexion 4-/5    Left Knee Extension 4+/5    Right/Left Ankle Right;Left    Right Ankle Dorsiflexion 4+/5    Right Ankle Plantar Flexion 4+/5    Right Ankle Inversion 4+/5    Right Ankle Eversion 5/5    Left Ankle Dorsiflexion 4/5    Left Ankle Plantar Flexion 4+/5    Left Ankle Inversion 4/5    Left Ankle Eversion 4/5      Transfers   Transfers Sit to Stand;Stand to Sit    Sit to Stand 4: Min guard    Stand to Sit 4: Min guard      Ambulation/Gait   Ambulation/Gait Yes    Ambulation/Gait Assistance 5: Supervision;4: Min guard    Assistive device Straight cane    Gait Pattern --   L lateral trunk lean with steppage gait   Gait velocity decreased      Standardized Balance Assessment   Standardized Balance Assessment Timed Up and Go Test      Timed Up and Go Test   Normal TUG (seconds) 30                        Objective measurements completed on examination: See above findings.                PT Education - 06/13/21 1459     Education Details HEP details and procedure for ambulation testing    Person(s) Educated Patient    Methods Explanation;Demonstration    Comprehension Verbalized understanding              PT Short Term Goals - 06/13/21  [place] #1   Title Patient  will be able to complete a sit to stand transfer within 3s of a given verbal cue with UE assistance.    Time 2    Period Weeks    Status New    Target Date 06/27/21      PT SHORT TERM GOAL #2   Title Patient will be able to ambulate at least 17ft with AD with supervision and without LOB or lateral gait deviation.    Time 3    Period Weeks    Status New    Target Date 07/04/21               PT Long Term Goals - 06/13/21 1520       PT LONG TERM GOAL #1   Title Patient will achieve a TUG score of at least 16s or less without significant lateral gait deviation and with use of AD.    Time 6    Period Weeks    Status New      PT LONG TERM GOAL #2   Title Patient will be able to ambulate at least 364ft with use of AD and supervision and without moderate lateral gait deviation.    Time 6    Period Weeks    Status New    Target Date 07/25/21                    Plan - 06/13/21 1504     Clinical Impression Statement Patient is a 76 y.o. female presenting to physical therapy with c/o frequent falls and LE weakness. She presents with limited deficits in LE strength, postural impairments, and functional mobility with ADL. She is having to modify and restrict ADL as indicated by TUG score as well as subjective information and objective measures which is affecting overall participation. Patient will benefit from skilled physical therapy in order to improve function and reduce impairment.    Personal Factors and Comorbidities Age;Comorbidity 2;Comorbidity 3+    Comorbidities Hydrocephalus, chronic back pain, frequent falls, vision problems, COPD, and hx of breast cx.    Examination-Activity Limitations Transfers;Locomotion Level;Stairs;Stand;Squat;Carry;Bend    Examination-Participation Restrictions Church;Cleaning;Community Activity;Driving;[facility]alla Leaver [facility]    Stability/Clinical Decision Making Stable/Uncomplicated    Clinical Decision Making Low    Rehab Potential Fair     PT Frequency 2x / week    PT Duration 6 weeks    PT Treatment/Interventions Therapeutic exercise;Gait training;Functional mobility training;Therapeutic activities;Neuromuscular re-education;Balance training;DME Instruction;ADLs/Self Care Home Management    PT Next Visit Plan Continue with strengthening of the LEs, generalized balance tasks, transfer safety and mechanics, and use of AD as needed.    PT Home Exercise Plan Seated March, Seated Long Arc [place], and Seated Hamstring Set    Consulted and Agree with Plan of Care Patient             Patient will benefit from skilled therapeutic intervention in order to improve the following deficits and impairments:  Abnormal gait, Decreased balance, Difficulty walking, Decreased strength, Decreased coordination, Improper body mechanics, Decreased safety awareness, Decreased activity tolerance, Decreased knowledge of use of DME  Visit Diagnosis: Unsteady gait  Muscle weakness (generalized)  Other abnormalities of gait and mobility     Problem List Patient Active Problem List   Diagnosis Date Noted   Asymptomatic bilateral carotid artery stenosis 05/17/2021   Ectopic atrial rhythm 05/17/2021   Syncope and collapse 05/17/2021   H/O:  CVA (cerebrovascular accident) 11/01/2019   Bleeding from varicose vein 01/05/2019   Dizziness 01/03/2018   DDD (degenerative disc disease), lumbar 08/31/2017   Generalized OA 08/31/2017   Venous hypertension of both lower extremities 07/03/2017   Osteopenia 05/13/2016   History of left breast cancer 05/13/2016   Glaucoma 05/13/2016   Hyperlipidemia 05/13/2016   Urinary incontinence 05/13/2016   Tobacco use disorder 05/13/2016   GERD (gastroesophageal reflux disease) 06/27/2013   H/O: upper GI bleed 06/27/2013   Epigastric pain 06/27/2013   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

## 2021-06-13 NOTE — Patient Instructions (Signed)
Access Code: [email or web address] URL: [email or web address] Date: 06/13/2021 Prepared by: Adalberto Cole  Exercises Seated March - 2-3 x daily - 7 x weekly - 3 sets - 16 reps Seated Long Arc [place] - 2-3 x daily - 7 x weekly - 3 sets - 10 reps Seated Hamstring Set - 2-3 x daily - 7 x weekly - 2 sets - 8 reps - 5s hold

## 2021-06-14 ENCOUNTER — Encounter: Payer: Self-pay | Admitting: Cardiology

## 2021-06-14 ENCOUNTER — Other Ambulatory Visit: Payer: Self-pay

## 2021-06-14 ENCOUNTER — Ambulatory Visit: Payer: Medicare HMO | Admitting: Cardiology

## 2021-06-14 VITALS — BP 147/57 | HR 71 | Temp 97.7°F | Resp 16 | Ht 68.0 in | Wt 149.0 lb

## 2021-06-14 DIAGNOSIS — R2681 Unsteadiness on feet: Secondary | ICD-10-CM

## 2021-06-14 DIAGNOSIS — I491 Atrial premature depolarization: Secondary | ICD-10-CM | POA: Diagnosis not present

## 2021-06-14 DIAGNOSIS — R55 Syncope and collapse: Secondary | ICD-10-CM

## 2021-06-14 DIAGNOSIS — Z8673 Personal history of transient ischemic attack (TIA), and cerebral infarction without residual deficits: Secondary | ICD-10-CM | POA: Diagnosis not present

## 2021-06-14 NOTE — Addendum Note (Signed)
Addended by: Kela Millin on: 06/14/2021 09:38 AM   Modules accepted: Orders

## 2021-06-14 NOTE — Patient Instructions (Signed)
Do not take a shower until tomorrow evening.  After you take a shower, please remove the bandage and keep the wound site dry.  There are 3 Steri-Strips, do not peel them off.  They will fall off by themselves.  No need to put any other dressings, please call us if there is discharge, swelling or redness around the site or significant pain.

## 2021-06-14 NOTE — Progress Notes (Addendum)
Primary Physician/Referring:  Bonnita Hollow, MD  Patient ID: [name], female    DOB: 08-Jul-1945, 76 y.o.   MRN: [id number]  Chief Complaint  Patient presents with   Loss of Consciousness   ORTHOSTATIC HYPOTENSION   Abnormal ECG   HPI:    [name]  is a 76 y.o. Caucasian female patient with remote pulmonary embolism, history of breast cancer SP lumpectomy followed by chemo and radiation therapy in 2003, history of TIA and cerebellar embolic stroke in 2993 and new stroke by MRI in 05/2021, normal pressure hydrocephalus on conservative therapy, chronic back pain and spina bifida and gait instability, COPD with ongoing tobacco use disorder hyperlipidemia was seen in the emergency room on 03/27/2021 with an episode of syncope.  She is now referred to me for further evaluation of the same.  I had last seen her 6 weeks ago.  She quit smoking on 05/10/2021.  No new symptomatology and she has not had any further episodes of syncope.  On 03/27/2021, patient had frank episodes of syncope on the driveway without any premonitory symptoms.  After excluding other causes for syncope, it is felt that with her advanced age, sinus node dysfunction, AV nodal blockade, potentially other significant arrhythmias may be etiology for syncope and hence no schedule for loop recorder implantation.  No new symptomatology.  She has not had any recurrence of syncope or presyncope since I last saw her.  Objective   Vitals:   06/14/21 0838  BP: (!) 147/57  Pulse: 71  Resp: 16  Temp: 97.7 F (36.5 C)  SpO2: 95%     Orthostatic VS for the past 72 hrs (Last 3 readings):  Orthostatic BP Patient Position BP Location Cuff Size Orthostatic Pulse  05/17/21 1410 157/60 Standing Left Arm Normal 75  05/17/21 1409 154/64 Sitting Left Arm Normal 62  05/17/21 1408 149/62 Supine Left Arm Normal 78    Physical Exam Neck:     Vascular: No carotid bruit or JVD.  Cardiovascular:     Rate and Rhythm: Normal rate  and regular rhythm.     Pulses: Intact distal pulses.     Heart sounds: Normal heart sounds. No murmur heard.   No gallop.  Pulmonary:     Effort: Pulmonary effort is normal.     Breath sounds: Normal breath sounds.  Abdominal:     General: Bowel sounds are normal.     Palpations: Abdomen is soft.  Musculoskeletal:     Right lower leg: Edema (trace ankle edema) present.     Left lower leg: Edema (trace ankle edema) present.     Laboratory examination:   Radiology:   MRI 05/11/2018: 1. Punctate acute to early subacute left cerebellar infarct. 2. Multiple tiny chronic bilateral cerebellar infarcts. 3. Mild chronic small vessel ischemic disease in the cerebral white matter. 4. Moderate lateral and third ventriculomegaly which could reflect normal pressure hydrocephalus in the appropriate clinical setting.  CT angiogram of the neck and head 05/27/2021: 1. No acute intracranial pathology. 2. Unchanged enlargement of the ventricles in a configuration that can be seen with normal pressure hydrocephalus in the correct clinical setting. 3. Mild atherosclerotic plaque in the bilateral carotid bulbs without hemodynamically significant stenosis. No evidence of dissection in the neck. 4. Moderate focal stenosis in the left M1 segment. Otherwise, mild intracranial atherosclerotic disease without other high-grade stenosis or occlusion.  MRI Brain 05/01/2021: 1. Evidence of a small subacute white matter lacunar infarct in the right centrum  semiovale. No associated hemorrhage or mass effect. Elsewhere stable chronic white matter signal changes and multiple small chronic infarcts in the cerebellum.   2. Chronic lateral and 3rd ventriculomegaly, stable since 2019. Evidence of hyperdynamic flow at the cerebral aqueduct argues against congenital aqueductal stenosis. And by report, previous workup for NPH includes a negative three day lumbar drainage trial last year.  Cardiac Studies:    Carotid artery duplex 05/11/2018: 1. Mild atherosclerotic disease in bilateral carotid arteries. 2. Estimated degree of stenosis in the right internal carotid artery is less than 50%. 3. Mildly elevated peak systolic velocity in the distal left internal artery could be related to tortuosity but cannot exclude stenosis measuring 50-69%. Recommend surveillance. 4. Patent vertebral arteries with antegrade flow.  PCV ECHOCARDIOGRAM COMPLETE 05/06/2021 Left ventricle cavity is normal in size and wall thickness. Normal global wall motion. Normal LV systolic function with visual EF 64%. Doppler evidence of grade I (impaired) diastolic dysfunction, normal LAP. Left atrial cavity is moderately dilated. Mild mitral valve leaflet thickening. No evidence of mitral stenosis. Mild (Grade I) mitral regurgitation. Normal right atrial pressure. Compared to previous study in 2019, moderate LA dilatation is new finding. No other significant change noted.    PCV MYOCARDIAL PERFUSION WITH LEXISCAN 05/06/2021 Nondiagnostic ECG stress. The heart rate response was consistent with Lexiscan.  The blood pressure response was physiologic. Breast attenuation artifact in the mid to distal anterior and septal region without ischemia.  Normal myocardial perfusion. Overall LV systolic function is normal without regional wall motion abnormalities. Stress LV EF: 64%. No previous exam available for comparison. Low risk.  Zio Patch Extended out patient EKG monitoring 14 days starting 04/12/2021: Predominant rhythm is normal sinus rhythm.  Minimum heart rate 39 bpm, maximum heart rate 184 bpm.  There were 613 brief atrial tachycardia episodes, longest for 26 seconds.  Isolated PACs and PVCs were noted.  Rare ventricular ectopics.  There were no patient triggered events, no symptoms reported. There were no heart block, no atrial fibrillation.  EKG:   EKG 05/17/2021: Ectopic atrial rhythm at the rate of 70 bpm, prominent R in  V2 and V3, RVH.  Pulmonary disease pattern. No significant change from EKG 04/12/2021. Compared to 03/27/2021, ectopic atrial rhythm is new.    Assessment     ICD-10-CM   1. Syncope and collapse  R55 For home use only DME 4 wheeled rolling walker with seat    2. 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  New 05/01/2021 MRI right centrum semiovalle  Z86.73     4. Gait instability  R26.81 For home use only DME 4 wheeled rolling walker with seat      Prodedure performed: Insertion of Biotronik Loop Recorder. Serial # [id number]  Indication: Syncope and collapse  After obtaining informed consent, explaining the procedure to the patient, under sterile precautions, local anesthesia with 1% lidocaine with epinephrine was injected subcutaneously in the left parasternal region.  20 mL utilized.  A small nick was made in the left paraspinal region at the 3rd intercostal space. Biotronik loop recorder was inserted without any complications.  Patient tolerated the procedure well.  The incision was closed with Steri-Strips.  There is no blood loss, no hematoma.  Written instrtuctions regarding wound care given to the patient.  Device setting:  R wave amplitude 1.58mV.   Programmed:  AF detection to Low HVR 140/min. Bradycardia 40 BPM Sudden rate drop 50  bpm Asystole 3 Sec Patient Trigger: ON  Addendum: Patient requests a walker, orders sent.  Adrian Prows, MD, [facility] 06/14/2021, 9:37 AM Office: [phone] Fax: [phone] Pager: [phone]

## 2021-06-15 ENCOUNTER — Encounter: Payer: Self-pay | Admitting: Cardiology

## 2021-06-15 DIAGNOSIS — Z95818 Presence of other cardiac implants and grafts: Secondary | ICD-10-CM

## 2021-06-15 HISTORY — DX: Presence of other cardiac implants and grafts: Z95.818

## 2021-06-19 ENCOUNTER — Encounter (HOSPITAL_COMMUNITY): Payer: Self-pay | Admitting: Physical Therapy

## 2021-06-19 ENCOUNTER — Ambulatory Visit (HOSPITAL_COMMUNITY): Payer: Medicare HMO | Admitting: Physical Therapy

## 2021-06-19 ENCOUNTER — Other Ambulatory Visit: Payer: Self-pay

## 2021-06-19 DIAGNOSIS — R2689 Other abnormalities of gait and mobility: Secondary | ICD-10-CM | POA: Diagnosis not present

## 2021-06-19 DIAGNOSIS — M6281 Muscle weakness (generalized): Secondary | ICD-10-CM | POA: Diagnosis not present

## 2021-06-19 DIAGNOSIS — R2681 Unsteadiness on feet: Secondary | ICD-10-CM

## 2021-06-19 NOTE — Therapy (Signed)
[place], [place], [address] Phone: [phone]   Fax:  [phone]  Physical Therapy Treatment  Patient Details  Name: [name] MRN: [id number] Date of Birth: 1945-09-22 Referring Provider (PT): Cameron Sprang, MD   Encounter Date: 06/19/2021   PT End of Session - 06/19/21 1134     Visit Number 2    Number of Visits 12    Date for PT Re-Evaluation 07/25/21    Authorization Type Primary: [employer](Auth required) // Secondary: [employer](Follows medicare guidelines)    Authorization Time Period 12 visits requested - 12 visits authorized thru 06/01/2022    Authorization - Visit Number 1    Authorization - Number of Visits 12    Progress Note Due on Visit 10    PT Start Time 4818    PT Stop Time 1130    PT Time Calculation (min) 45 min    Equipment Utilized During Treatment Gait belt    Activity Tolerance Patient tolerated treatment well    Behavior During Therapy [facility] for tasks assessed/performed             Past Medical History:  Diagnosis Date   Arthritis    Breast cancer ([place])    left breast/lumpectomy/chemo/rad 2003   COPD (chronic obstructive pulmonary disease) ([place])    DDD (degenerative disc disease), lumbar 08/31/2017   Diverticulitis    GERD (gastroesophageal reflux disease)    Hemoptysis    Hyperlipidemia    Iron deficiency    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 06/15/2021   Macular degeneration    bilateral   Personal history of radiation therapy 2003   Pulmonary embolism ([place])    Syncope and collapse 05/17/2021   Tachycardia     Past Surgical History:  Procedure Laterality Date   BACK SURGERY     BREAST LUMPECTOMY     COLONOSCOPY  01/28/2012   Procedure: COLONOSCOPY;  Surgeon: Rogene Houston, MD;  [facility];  Service: Endoscopy;  Laterality: N/A;  1200   COLONOSCOPY  01-2012   ESOPHAGOGASTRODUODENOSCOPY N/A 07/01/2013   Procedure:  ESOPHAGOGASTRODUODENOSCOPY (EGD);  Surgeon: Rogene Houston, MD;  Location: [facility];  Service: Endoscopy;  Laterality: N/A;  1:10   LAPAROSCOPIC TUBAL LIGATION  1970   TONSILLECTOMY     Patient was age 76    There were no vitals filed for this visit.   Subjective Assessment - 06/19/21 1046     Pertinent History Normal pressure hydrocephalus and frequent falls    Limitations Standing;Walking;House hold activities    Patient Stated Goals Improved balance. She would like to be able to walk around inside her house with indpendence.                               [place] Adult PT Treatment/Exercise - 06/19/21 0001       Bed Mobility   Bed Mobility Rolling Right;Rolling Left;Left Sidelying to Sit;Sit to Sidelying Left    Rolling Right Contact Guard/Touching assist   x5   Rolling Left Contact Guard/Touching assist   x5   Left Sidelying to Sit Contact Guard/Touching assist      Exercises   Exercises Knee/Hip      Knee/Hip Exercises: Supine   Bridges 10 reps                 Balance Exercises - 06/19/21 0001  Balance Exercises: Standing   Standing Eyes Opened Narrow base of support (BOS)    Sidestepping 2 reps    Heel Raises 10 reps;Both   pt tends to want to put all her wt on her Rt LE needs cuing to shift .   Sit to Stand Standard surface   x10   Other Standing Exercises hip abduction x 10                  PT Short Term Goals - 06/19/21 1052       PT SHORT TERM GOAL #1   Title Patient will be able to complete a sit to stand transfer within 3s of a given verbal cue with UE assistance.    Time 2    Period Weeks    Status On-going    Target Date 06/27/21      PT SHORT TERM GOAL #2   Title Patient will be able to ambulate at least 20ft with AD with supervision and without LOB or lateral gait deviation.    Time 3    Period Weeks    Status On-going    Target Date 07/04/21      PT SHORT TERM GOAL #3   Status On-going      PT  SHORT TERM GOAL #4   Status On-going               PT Long Term Goals - 06/19/21 1053       PT LONG TERM GOAL #1   Title Patient will achieve a TUG score of at least 16s or less without significant lateral gait deviation and with use of AD.    Time 6    Period Weeks    Status On-going      PT LONG TERM GOAL #2   Title Patient will be able to ambulate at least 342ft with use of AD and supervision and without moderate lateral gait deviation.    Time 6    Period Weeks    Status On-going    Target Date 07/25/21      PT LONG TERM GOAL #3   Title Patient will ambulate at 1.0 m/s during 2MWT with LRAD and no signs of ataxia to demonstrate reduced fall risk and improved community mobility access.    Status On-going      PT LONG TERM GOAL #4   Title Patient will improve TUG score by 6 seconds to demosntrate improve balance with gait and improve 5x sit to stand time by 6 seconds to demonstrate improve LE strength and improve function with transitional movements.    Time 6    Status On-going                   Plan - 06/19/21 1145     Clinical Impression Statement Treatment focused on balance and bed activities.  PT has to be cued to use her hips rather than her shoulders to attempt to regain balance.  Pt needs to be cued to equalize wt on B LE as she tends to place majority of wt on her Rt LE>    Personal Factors and Comorbidities Age;Comorbidity 2;Comorbidity 3+    Comorbidities Hydrocephalus, chronic back pain, frequent falls, vision problems, COPD, and hx of breast cx.    Examination-Activity Limitations Transfers;Locomotion Level;Stairs;Stand;Squat;Carry;Bend    Examination-Participation Restrictions Church;Cleaning;Community Activity;Driving;[facility]    Stability/Clinical Decision Making Stable/Uncomplicated    Rehab Potential Fair    PT Frequency 2x / week  PT Duration 6 weeks    PT Treatment/Interventions Therapeutic exercise;Gait training;Functional  mobility training;Therapeutic activities;Neuromuscular re-education;Balance training;DME Instruction;ADLs/Self Care Home Management    PT Next Visit Plan Continue with strengthening of the LEs, generalized balance tasks, transfer safety and mechanics, and use of AD as needed.    PT Home Exercise Plan Seated March, Seated Long Arc Quad, and Seated Hamstring Set; 1/18:  tall sitting, sit to sidelying, rolling, bridge    Consulted and Agree with Plan of Care Patient             Patient will benefit from skilled therapeutic intervention in order to improve the following deficits and impairments:  Abnormal gait, Decreased balance, Difficulty walking, Decreased strength, Decreased coordination, Improper body mechanics, Decreased safety awareness, Decreased activity tolerance, Decreased knowledge of use of DME  Visit Diagnosis: Unsteady gait  Muscle weakness (generalized)  Other abnormalities of gait and mobility     Problem List Patient Active Problem List   Diagnosis Date Noted   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 06/15/2021   Asymptomatic bilateral carotid artery stenosis 05/17/2021   Ectopic atrial rhythm 05/17/2021   Syncope and collapse 05/17/2021   H/O: CVA (cerebrovascular accident) 11/01/2019   Bleeding from varicose vein 01/05/2019   Dizziness 01/03/2018   DDD (degenerative disc disease), lumbar 08/31/2017   Generalized OA 08/31/2017   Venous hypertension of both lower extremities 07/03/2017   Osteopenia 05/13/2016   History of left breast cancer 05/13/2016   Glaucoma 05/13/2016   Hyperlipidemia 05/13/2016   Urinary incontinence 05/13/2016   Tobacco use disorder 05/13/2016   GERD (gastroesophageal reflux disease) 06/27/2013   H/O: upper GI bleed 06/27/2013   Epigastric pain 06/27/2013   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

## 2021-06-24 ENCOUNTER — Encounter: Payer: Self-pay | Admitting: Cardiology

## 2021-06-24 ENCOUNTER — Other Ambulatory Visit: Payer: Self-pay

## 2021-06-24 ENCOUNTER — Ambulatory Visit: Payer: Medicare HMO | Admitting: Cardiology

## 2021-06-24 VITALS — BP 134/55 | HR 70 | Temp 97.7°F | Resp 16 | Ht 68.0 in | Wt 151.6 lb

## 2021-06-24 DIAGNOSIS — I951 Orthostatic hypotension: Secondary | ICD-10-CM | POA: Diagnosis not present

## 2021-06-24 DIAGNOSIS — Z8673 Personal history of transient ischemic attack (TIA), and cerebral infarction without residual deficits: Secondary | ICD-10-CM

## 2021-06-24 DIAGNOSIS — I6523 Occlusion and stenosis of bilateral carotid arteries: Secondary | ICD-10-CM | POA: Diagnosis not present

## 2021-06-24 NOTE — Progress Notes (Signed)
Primary Physician/Referring:  Bonnita Hollow, MD  Patient ID: [name], female    DOB: 12/08/1945, 76 y.o.   MRN: [id number]  Chief Complaint  Patient presents with   Wound Check   Follow-up    1 week   HPI:    [name]  is a 76 y.o. Caucasian female patient with remote pulmonary embolism, history of breast cancer SP lumpectomy followed by chemo and radiation therapy in 2003, history of TIA and cerebellar embolic stroke in 1443 and new stroke by MRI in 05/2021, normal pressure hydrocephalus on conservative therapy, chronic back pain and spina bifida and gait instability, COPD with ongoing tobacco use disorder hyperlipidemia was seen in the emergency room on 03/27/2021 with an episode of syncope.    She was subsequently referred to our office for further evaluation. She quit smoking on 05/10/2021.  Patient underwent loop recorder implantation 06/14/2021.  She also had recent carotid artery duplex done which remained stable, will therefore continue surveillance every 6 months.  At previous office visit Dr. Einar Gip increased atorvastatin to 20 mg daily.  Patient is tolerating increased dose without issue.  She is feeling well overall at today's office visit.  Past Medical History:  Diagnosis Date   Arthritis    Breast cancer ([place])    left breast/lumpectomy/chemo/rad 2003   COPD (chronic obstructive pulmonary disease) ([place])    DDD (degenerative disc disease), lumbar 08/31/2017   Diverticulitis    GERD (gastroesophageal reflux disease)    Hemoptysis    Hyperlipidemia    Iron deficiency    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 06/15/2021   Macular degeneration    bilateral   Personal history of radiation therapy 2003   Pulmonary embolism ([place])    Syncope and collapse 05/17/2021   Tachycardia    Past Surgical History:  Procedure Laterality Date   BACK SURGERY     BREAST LUMPECTOMY     COLONOSCOPY  01/28/2012   Procedure: COLONOSCOPY;  Surgeon: Rogene Houston, MD;   [facility];  Service: Endoscopy;  Laterality: N/A;  1200   COLONOSCOPY  01-2012   ESOPHAGOGASTRODUODENOSCOPY N/A 07/01/2013   Procedure: ESOPHAGOGASTRODUODENOSCOPY (EGD);  Surgeon: Rogene Houston, MD;  Location[facility];  Service: Endoscopy;  Laterality: N/A;  1:10   LAPAROSCOPIC TUBAL LIGATION  1970   TONSILLECTOMY     Patient was age 19   Family History  Problem Relation Age of Onset   Heart disease Mother    Alzheimer's disease Mother    Heart disease Father    Lung disease Father    Healthy Son     Social History   Tobacco Use   Smoking status: Some Days    Packs/day: 0.50    Years: 30.00    Pack years: 15.00    Types: Cigarettes   Smokeless tobacco: Never   Tobacco comments:    05/17/18 1- 1.5 PPD  Substance Use Topics   Alcohol use: No    Alcohol/week: 0.0 standard drinks   Marital Status: Married  ROS  Review of Systems  Cardiovascular:  Negative for chest pain, dyspnea on exertion and leg swelling.  Musculoskeletal:  Positive for back pain (chronic).  Gastrointestinal:  Negative for melena.  Neurological:  Positive for dizziness (no recurrence) and loss of balance.  Objective  Blood pressure (!) 134/55, pulse 70, temperature 97.7 F (36.5 C), temperature source Temporal, resp. rate 16, height 5\' 8"  (1.727 m), weight 151 lb 9.6 oz (68.8 kg),  SpO2 98 %. Body mass index is 23.05 kg/m.  Vitals with BMI 06/24/2021 06/14/2021 05/17/2021  Height 5\' 8"  5\' 8"  5\' 8"   Weight 151 lbs 10 oz 149 lbs 154 lbs 3 oz  BMI 23.06 38.10 17.51  Systolic 025 852 778  Diastolic 55 57 59  Pulse 70 71 72    Orthostatic VS for the past 72 hrs (Last 3 readings):  Patient Position BP Location Cuff Size  06/24/21 1351 Sitting Left Arm Normal    Physical Exam Vitals reviewed.  Neck:     Vascular: No carotid bruit or JVD.  Cardiovascular:     Rate and Rhythm: Normal rate and regular rhythm.     Pulses: Intact distal pulses.     Heart sounds: Normal heart sounds. No  murmur heard.   No gallop.     Comments: Loop recorder implantation site well-healed, skin glue remains intact.  No evidence of erythema, ecchymosis, drainage, warmth, swelling. Pulmonary:     Effort: Pulmonary effort is normal.     Breath sounds: Normal breath sounds.  Musculoskeletal:     Right lower leg: Edema (trace ankle edema) present.     Left lower leg: Edema (trace ankle edema) present.     Laboratory examination:   Recent Labs    03/27/21 1218  NA 140  K 3.7  CL 103  CO2 29  GLUCOSE 91  BUN 13  CREATININE 0.57  CALCIUM 9.1  GFRNONAA >60   CrCl cannot be calculated (Patient's most recent lab result is older than the maximum 21 days allowed.).  CMP Latest Ref Rng & Units 03/27/2021 11/01/2019 01/05/2019  Glucose 70 - 99 mg/dL 91 105(H) 96  BUN 8 - 23 mg/dL 13 12 12   Creatinine 0.44 - 1.00 mg/dL 0.57 0.58(L) 0.61  Sodium 135 - 145 mmol/L 140 141 140  Potassium 3.5 - 5.1 mmol/L 3.7 5.2 5.1  Chloride 98 - 111 mmol/L 103 104 102  CO2 22 - 32 mmol/L 29 29 30   Calcium 8.9 - 10.3 mg/dL 9.1 10.0 9.5  Total Protein 6.1 - 8.1 g/dL - 6.8 6.6  Total Bilirubin 0.2 - 1.2 mg/dL - 0.5 0.3  Alkaline Phos 33 - 130 U/L - - -  AST 10 - 35 U/L - 19 19  ALT 6 - 29 U/L - 23 19   CBC Latest Ref Rng & Units 03/27/2021 11/01/2019 01/28/2019  WBC 4.0 - 10.5 K/uL 6.7 9.4 8.1  Hemoglobin 12.0 - 15.0 g/dL 12.5 11.2(L) 11.3(L)  Hematocrit 36.0 - 46.0 % 38.7 35.5 35.5  Platelets 150 - 400 K/uL 324 444(H) 413(H)    Lipid Panel No results for input(s): CHOL, TRIG, LDLCALC, VLDL, HDL, CHOLHDL, LDLDIRECT in the last 8760 hours. Lipid Panel     Component Value Date/Time   CHOL 186 11/01/2019 0909   TRIG 138 11/01/2019 0909   HDL 59 11/01/2019 0909   CHOLHDL 3.2 11/01/2019 0909   VLDL 24 07/03/2016 0957   LDLCALC 103 (H) 11/01/2019 0909     HEMOGLOBIN A1C No results found for: HGBA1C, MPG TSH No results for input(s): TSH in the last 8760 hours.  External labs:   Cholesterol, total  189.000 m 04/10/2020 HDL 63.000 mg 04/10/2020 LDL 105.000 M 04/10/2020 Triglycerides 119.000 m 04/10/2020  A1C 6.000 % 01/10/2020  Allergies   Allergies  Allergen Reactions   Sulfa Antibiotics Other (See Comments)    Childhood Allergy  05/17/18 patient denies     Medication prior to this encounter:  Outpatient Medications Prior to Visit  Medication Sig Dispense Refill   atorvastatin (LIPITOR) 20 MG tablet Take 0.5 tablets (10 mg total) by mouth daily. 90 tablet 3   CALCIUM PO Take 1 tablet by mouth daily.     Cholecalciferol (VITAMIN D PO) Take 1 tablet by mouth daily.     clopidogrel (PLAVIX) 75 MG tablet Take 1 tablet (75 mg total) by mouth daily. 90 tablet 3   diclofenac (VOLTAREN) 75 MG EC tablet Take 1 tablet (75 mg total) by mouth 2 (two) times daily. 180 tablet 2   famotidine (PEPCID) 40 MG tablet Take 40 mg by mouth daily.     loratadine (CLARITIN) 10 MG tablet Take 10 mg by mouth daily.     Multiple Vitamin (MULTIVITAMIN WITH MINERALS) TABS tablet Take 1 tablet by mouth daily.     Multiple Vitamins-Minerals (PRESERVISION AREDS 2) CAPS Take by mouth 2 (two) times daily.      Omega-3 Fatty Acids (FISH OIL) 1000 MG CAPS Take by mouth daily.     timolol (TIMOPTIC) 0.5 % ophthalmic solution Place 1 drop into the left eye daily.      No facility-administered medications prior to visit.     Medication list after today's encounter   Current Outpatient Medications  Medication Instructions   atorvastatin (LIPITOR) 10 mg, Oral, Daily   CALCIUM PO 1 tablet, Daily   Cholecalciferol (VITAMIN D PO) 1 tablet, Daily   clopidogrel (PLAVIX) 75 mg, Oral, Daily   diclofenac (VOLTAREN) 75 mg, Oral, 2 times daily   famotidine (PEPCID) 40 mg, Oral, Daily   loratadine (CLARITIN) 10 mg, Oral, Daily   Multiple Vitamin (MULTIVITAMIN WITH MINERALS) TABS tablet 1 tablet, Oral, Daily   Multiple Vitamins-Minerals (PRESERVISION AREDS 2) CAPS Oral, 2 times daily   Omega-3 Fatty Acids (FISH OIL)  1000 MG CAPS Daily   timolol (TIMOPTIC) 0.5 % ophthalmic solution 1 drop, Left Eye, Daily    Radiology:   MRI 05/11/2018: 1. Punctate acute to early subacute left cerebellar infarct. 2. Multiple tiny chronic bilateral cerebellar infarcts. 3. Mild chronic small vessel ischemic disease in the cerebral white matter. 4. Moderate lateral and third ventriculomegaly which could reflect normal pressure hydrocephalus in the appropriate clinical setting.  CT angiogram of the neck and head 05/27/2021: 1. No acute intracranial pathology. 2. Unchanged enlargement of the ventricles in a configuration that can be seen with normal pressure hydrocephalus in the correct clinical setting. 3. Mild atherosclerotic plaque in the bilateral carotid bulbs without hemodynamically significant stenosis. No evidence of dissection in the neck. 4. Moderate focal stenosis in the left M1 segment. Otherwise, mild intracranial atherosclerotic disease without other high-grade stenosis or occlusion.  MRI Brain 05/01/2021: 1. Evidence of a small subacute white matter lacunar infarct in the right centrum semiovale. No associated hemorrhage or mass effect. Elsewhere stable chronic white matter signal changes and multiple small chronic infarcts in the cerebellum.   2. Chronic lateral and 3rd ventriculomegaly, stable since 2019. Evidence of hyperdynamic flow at the cerebral aqueduct argues against congenital aqueductal stenosis. And by report, previous workup for NPH includes a negative three day lumbar drainage trial last year.  Cardiac Studies:   PCV ECHOCARDIOGRAM COMPLETE 05/06/2021 Left ventricle cavity is normal in size and wall thickness. Normal global wall motion. Normal LV systolic function with visual EF 64%. Doppler evidence of grade I (impaired) diastolic dysfunction, normal LAP. Left atrial cavity is moderately dilated. Mild mitral valve leaflet thickening. No evidence of mitral stenosis. Mild (Grade I)  mitral regurgitation. Normal right atrial pressure. Compared to previous study in 2019, moderate LA dilatation is new finding. No other significant change noted.    PCV MYOCARDIAL PERFUSION WITH LEXISCAN 05/06/2021 Nondiagnostic ECG stress. The heart rate response was consistent with Lexiscan.  The blood pressure response was physiologic. Breast attenuation artifact in the mid to distal anterior and septal region without ischemia.  Normal myocardial perfusion. Overall LV systolic function is normal without regional wall motion abnormalities. Stress LV EF: 64%. No previous exam available for comparison. Low risk.  Zio Patch Extended out patient EKG monitoring 14 days starting 04/12/2021: Predominant rhythm is normal sinus rhythm.  Minimum heart rate 39 bpm, maximum heart rate 184 bpm.  There were 613 brief atrial tachycardia episodes, longest for 26 seconds.  Isolated PACs and PVCs were noted.  Rare ventricular ectopics.  There were no patient triggered events, no symptoms reported. There were no heart block, no atrial fibrillation.  Carotid artery duplex 06/12/2020: Duplex suggests stenosis in the right internal carotid artery (1-15%). Duplex suggests stenosis in the left internal carotid artery (50-69%). Diffuse mild homogeneous plaque throughout the carotid arteries.  Antegrade right vertebral artery flow. Antegrade left vertebral artery flow. No significant change since prior study 05/11/2018 report. Follow up in six months is appropriate if clinically indicated.  Loop Recorder:  Loop recorder implantation 06/14/2021  EKG:   EKG 05/17/2021: Ectopic atrial rhythm at the rate of 70 bpm, prominent R in V2 and V3, RVH.  Pulmonary disease pattern. No significant change from EKG 04/12/2021. Compared to 03/27/2021, ectopic atrial rhythm is new.    Assessment     ICD-10-CM   1. Orthostatic hypotension  I95.1     2. Asymptomatic bilateral carotid artery stenosis  I65.23 PCV CAROTID DUPLEX  (BILATERAL)    Lipid Panel With LDL/HDL Ratio    3. 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  New 05/01/2021 MRI right centrum semiovalle  Z86.73       There are no discontinued medications.   No orders of the defined types were placed in this encounter.   Orders Placed This Encounter  Procedures   Lipid Panel With LDL/HDL Ratio   Recommendations:   JESICCA DIPIERRO is a 76 y.o. Caucasian female patient with remote pulmonary embolism, history of breast cancer SP lumpectomy followed by chemo and radiation therapy in 2003, history of TIA and cerebellar embolic stroke in 1950 and new stroke by MRI in 05/2021, normal pressure hydrocephalus on conservative therapy, chronic back pain and spina bifida and gait instability, COPD with ongoing tobacco use disorder hyperlipidemia was seen in the emergency room on 03/27/2021 with an episode of syncope.  She quit smoking on 05/10/2021.  She underwent loop recorder implantation on 06/12/2021 for evaluation of syncope and also stroke.  She underwent carotid artery duplex and presents for follow-up.     On her last office visit due to recurrent episodes of stroke, and also recommended discontinuing aspirin and changing her to Plavix.  She is tolerating increased dose of Lipitor to 20 mg.  We will repeat lipid profile testing.  She is presently doing well, we will continue to do remote monitoring of her loop recorder.  Blood pressure is well controlled, she does have mild orthostatic hypotension, hence will not be too aggressive with blood pressure management.  She will need lipid profile testing.  She will need continued surveillance of her carotid artery stenosis.  We will see her back in 6 months or sooner  if problems.   Alethia Berthold, PA-C 06/24/2021, 2:49 PM Office: [phone]

## 2021-06-24 NOTE — Progress Notes (Signed)
Discuss on OV soon

## 2021-06-25 ENCOUNTER — Ambulatory Visit (HOSPITAL_COMMUNITY): Payer: Medicare HMO | Admitting: Physical Therapy

## 2021-06-25 DIAGNOSIS — R2689 Other abnormalities of gait and mobility: Secondary | ICD-10-CM | POA: Diagnosis not present

## 2021-06-25 DIAGNOSIS — M6281 Muscle weakness (generalized): Secondary | ICD-10-CM | POA: Diagnosis not present

## 2021-06-25 DIAGNOSIS — R2681 Unsteadiness on feet: Secondary | ICD-10-CM | POA: Diagnosis not present

## 2021-06-25 NOTE — Therapy (Signed)
[place], [place], [address] Phone: [phone]   Fax:  [phone]  Physical Therapy Treatment  Patient Details  Name: [name] MRN: [id number] Date of Birth: 1945-07-23 Referring Provider (PT): Cameron Sprang, MD   Encounter Date: 06/25/2021   PT End of Session - 06/25/21 0921     Visit Number 3    Number of Visits 12    Date for PT Re-Evaluation 07/25/21    Authorization Type Primary: [employer] Medicare(Auth required) // Secondary: [employer](Follows medicare guidelines)    Authorization Time Period 12 visits requested - 12 visits authorized thru 06/01/2022    Authorization - Visit Number 2    Authorization - Number of Visits 12    Progress Note Due on Visit 10    PT Start Time 0836    PT Stop Time 0918    PT Time Calculation (min) 42 min    Equipment Utilized During Treatment Gait belt    Activity Tolerance Patient tolerated treatment well    Behavior During Therapy [facility] for tasks assessed/performed             Past Medical History:  Diagnosis Date   Arthritis    Breast cancer ([place])    left breast/lumpectomy/chemo/rad 2003   COPD (chronic obstructive pulmonary disease) ([place])    DDD (degenerative disc disease), lumbar 08/31/2017   Diverticulitis    GERD (gastroesophageal reflux disease)    Hemoptysis    Hyperlipidemia    Iron deficiency    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 06/15/2021   Macular degeneration    bilateral   Personal history of radiation therapy 2003   Pulmonary embolism ([place])    Syncope and collapse 05/17/2021   Tachycardia     Past Surgical History:  Procedure Laterality Date   BACK SURGERY     BREAST LUMPECTOMY     COLONOSCOPY  01/28/2012   Procedure: COLONOSCOPY;  Surgeon: Rogene Houston, MD;  [facility];  Service: Endoscopy;  Laterality: N/A;  1200   COLONOSCOPY  01-2012   ESOPHAGOGASTRODUODENOSCOPY N/A 07/01/2013   Procedure:  ESOPHAGOGASTRODUODENOSCOPY (EGD);  Surgeon: Rogene Houston, MD;  [facility];  Service: Endoscopy;  Laterality: N/A;  1:10   LAPAROSCOPIC TUBAL LIGATION  1970   TONSILLECTOMY     Patient was age 76    There were no vitals filed for this visit.   Subjective Assessment - 06/25/21 0844     Subjective Pt states she feels more dizziness today than usual.  Noted instability when entered clinic.  Pt also complains of Rt knee pain and Lt shoulder pain voicing need to go to orthopedic regarding this.    Currently in Pain? No/denies                               [facility] Adult PT Treatment/Exercise - 06/25/21 0001       Bed Mobility   Bed Mobility Rolling Right;Rolling Left;Left Sidelying to Sit;Sit to Sidelying Left    Rolling Right Contact Guard/Touching assist    Rolling Left Contact Guard/Touching assist    Left Sidelying to Sit Contact Guard/Touching assist      Knee/Hip Exercises: Standing   Other Standing Knee Exercises side stepping in front of mat 5RT (VC's for posture and step length)      Knee/Hip Exercises: Seated   Long Arc Quad Both;10 reps  Long [employer] Limitations 3 second hold    Marching Both;10 reps    Marching Limitations alternating with upright seated posture    Sit to [employer] 10 reps;without UE support      Knee/Hip Exercises: Supine   Bridges 2 sets;10 reps                       PT Short Term Goals - 06/19/21 1052       PT SHORT TERM GOAL #1   Title Patient will be able to complete a sit to stand transfer within 3s of a given verbal cue with UE assistance.    Time 2    Period Weeks    Status On-going    Target Date 06/27/21      PT SHORT TERM GOAL #2   Title Patient will be able to ambulate at least 40ft with AD with supervision and without LOB or lateral gait deviation.    Time 3    Period Weeks    Status On-going    Target Date 07/04/21      PT SHORT TERM GOAL #3   Status On-going      PT SHORT TERM  GOAL #4   Status On-going               PT Long Term Goals - 06/19/21 1053       PT LONG TERM GOAL #1   Title Patient will achieve a TUG score of at least 16s or less without significant lateral gait deviation and with use of AD.    Time 6    Period Weeks    Status On-going      PT LONG TERM GOAL #2   Title Patient will be able to ambulate at least 332ft with use of AD and supervision and without moderate lateral gait deviation.    Time 6    Period Weeks    Status On-going    Target Date 07/25/21      PT LONG TERM GOAL #3   Title Patient will ambulate at 1.0 m/s during 2MWT with LRAD and no signs of ataxia to demonstrate reduced fall risk and improved community mobility access.    Status On-going      PT LONG TERM GOAL #4   Title Patient will improve TUG score by 6 seconds to demosntrate improve balance with gait and improve 5x sit to stand time by 6 seconds to demonstrate improve LE strength and improve function with transitional movements.    Time 6    Status On-going                   Plan - 06/25/21 0919     Clinical Impression Statement Pt entered clinic with noted instability as compared to last visit and reports feeling more dizziness today.  Completed minimal standing activities at mat due to this.  Verbal cues needed with sidestepping to maintain forward gaze and take larger steps with activity.  Cues to come into upright posturing when standing from seated position.  Continued to work on bed mobility with instruction for logroll technique to assist with ease of motion.    Pt unable to complete logroll at this time and reverts back to sitting and laying straight down despite cues and encouragement.    Noted weakness in core as demonstrated with mobility and bridging.    Personal Factors and Comorbidities Age;Comorbidity 2;Comorbidity 3+    Comorbidities Hydrocephalus, chronic back pain,  frequent falls, vision problems, COPD, and hx of breast cx.     Examination-Activity Limitations Transfers;Locomotion Level;Stairs;Stand;Squat;Carry;Bend    Examination-Participation Restrictions Church;Cleaning;Community Activity;Driving;Valla Leaver [facility]    Stability/Clinical Decision Making Stable/Uncomplicated    Rehab Potential Fair    PT Frequency 2x / week    PT Duration 6 weeks    PT Treatment/Interventions Therapeutic exercise;Gait training;Functional mobility training;Therapeutic activities;Neuromuscular re-education;Balance training;DME Instruction;ADLs/Self Care Home Management    PT Next Visit Plan Continue with strengthening of the LEs, generalized balance tasks, transfer safety and mechanics, and use of AD as needed.    PT Home Exercise Plan Seated March, Seated Long Arc Quad, and Seated Hamstring Set; 1/18:  tall sitting, sit to sidelying, rolling, bridge    Consulted and Agree with Plan of Care Patient             Patient will benefit from skilled therapeutic intervention in order to improve the following deficits and impairments:  Abnormal gait, Decreased balance, Difficulty walking, Decreased strength, Decreased coordination, Improper body mechanics, Decreased safety awareness, Decreased activity tolerance, Decreased knowledge of use of DME  Visit Diagnosis: Unsteady gait  Muscle weakness (generalized)  Other abnormalities of gait and mobility     Problem List Patient Active Problem List   Diagnosis Date Noted   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 06/15/2021   Asymptomatic bilateral carotid artery stenosis 05/17/2021   Ectopic atrial rhythm 05/17/2021   Syncope and collapse 05/17/2021   H/O: CVA (cerebrovascular accident) 11/01/2019   Bleeding from varicose vein 01/05/2019   Dizziness 01/03/2018   DDD (degenerative disc disease), lumbar 08/31/2017   Generalized OA 08/31/2017   Venous hypertension of both lower extremities 07/03/2017   Osteopenia 05/13/2016   History of left breast cancer 05/13/2016   Glaucoma  05/13/2016   Hyperlipidemia 05/13/2016   Urinary incontinence 05/13/2016   Tobacco use disorder 05/13/2016   GERD (gastroesophageal reflux disease) 06/27/2013   H/O: upper GI bleed 06/27/2013   Epigastric pain 06/27/2013   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 [place], [place], [address] Phone: [phone]   Fax:  [phone]  Name: [name] MRN: [id number] Date of Birth: December 20, 1945

## 2021-06-27 ENCOUNTER — Ambulatory Visit (HOSPITAL_COMMUNITY): Payer: Medicare HMO | Admitting: Physical Therapy

## 2021-06-27 ENCOUNTER — Other Ambulatory Visit: Payer: Self-pay

## 2021-06-27 DIAGNOSIS — R2689 Other abnormalities of gait and mobility: Secondary | ICD-10-CM | POA: Diagnosis not present

## 2021-06-27 DIAGNOSIS — R2681 Unsteadiness on feet: Secondary | ICD-10-CM

## 2021-06-27 DIAGNOSIS — M6281 Muscle weakness (generalized): Secondary | ICD-10-CM

## 2021-06-27 NOTE — Therapy (Signed)
[place], [place], [address] Phone: [phone]   Fax:  [phone]  Physical Therapy Treatment  Patient Details  Name: [name] MRN: [id number] Date of Birth: [birth year] Referring Provider (PT): Cameron Sprang, MD   Encounter Date: 06/27/2021   PT End of Session - 06/27/21 1540     Visit Number 4    Number of Visits 12    Date for PT Re-Evaluation 07/25/21    Authorization Type Primary: [employer] Medicare(Auth required) // Secondary: United [employer](Follows medicare guidelines)    Authorization Time Period 12 visits requested - 12 visits authorized thru 06/01/2022    Authorization - Visit Number 4    Authorization - Number of Visits 12    Progress Note Due on Visit 10    PT Start Time 5009    PT Stop Time 1410    PT Time Calculation (min) 47 min    Equipment Utilized During Treatment Gait belt    Activity Tolerance Patient tolerated treatment well    Behavior During Therapy [facility] for tasks assessed/performed             Past Medical History:  Diagnosis Date   Arthritis    Breast cancer ([place])    left breast/lumpectomy/chemo/rad 2003   COPD (chronic obstructive pulmonary disease) ([place])    DDD (degenerative disc disease), lumbar 08/31/2017   Diverticulitis    GERD (gastroesophageal reflux disease)    Hemoptysis    Hyperlipidemia    Iron deficiency    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 06/15/2021   Macular degeneration    bilateral   Personal history of radiation therapy 2003   Pulmonary embolism ([place])    Syncope and collapse 05/17/2021   Tachycardia     Past Surgical History:  Procedure Laterality Date   BACK SURGERY     BREAST LUMPECTOMY     COLONOSCOPY  01/28/2012   Procedure: COLONOSCOPY;  Surgeon: Rogene Houston, MD;  [facility];  Service: Endoscopy;  Laterality: N/A;  1200   COLONOSCOPY  01-2012   ESOPHAGOGASTRODUODENOSCOPY N/A 07/01/2013   Procedure:  ESOPHAGOGASTRODUODENOSCOPY (EGD);  Surgeon: Rogene Houston, MD;  [facility];  Service: Endoscopy;  Laterality: N/A;  1:10   LAPAROSCOPIC TUBAL LIGATION  1970   TONSILLECTOMY     Patient was age 76    There were no vitals filed for this visit.   Subjective Assessment - 06/27/21 1330     Subjective pt arrived a little late.  STates she is still unbalanced with a little dizziness.  Reports bil knees are hurting and needs to see an orthopedist.    Currently in Pain? Yes    Pain Score 4     Pain Location Knee    Pain Orientation Right;Left    Pain Descriptors / Indicators Aching    Pain Type Chronic pain                               [facility] Adult PT Treatment/Exercise - 06/27/21 0001       Bed Mobility   Bed Mobility Rolling Right;Rolling Left;Left Sidelying to Sit;Sit to Sidelying Left    Rolling Right Supervision/verbal cueing    Rolling Left Supervision/Verbal cueing    Right Sidelying to Sit Supervision/Verbal cueing      Knee/Hip Exercises: Standing   Heel Raises Both;10 reps    Heel Raises Limitations toeraises 10X  Hip Abduction Both;10 reps    Other Standing Knee Exercises 4 well being      Knee/Hip Exercises: Seated   Long Arc Quad Both;15 reps    Long Arc Quad Limitations 3 second hold    Marching Both;15 reps    Marching Limitations alternating with upright seated posture    Sit to [employer] 5 reps;without UE support   standard chair with black foam and no UE assist                      PT Short Term Goals - 06/19/21 1052       PT SHORT TERM GOAL #1   Title Patient will be able to complete a sit to stand transfer within 3s of a given verbal cue with UE assistance.    Time 2    Period Weeks    Status On-going    Target Date 06/27/21      PT SHORT TERM GOAL #2   Title Patient will be able to ambulate at least 6ft with AD with supervision and without LOB or lateral gait deviation.    Time 3    Period Weeks     Status On-going    Target Date 07/04/21      PT SHORT TERM GOAL #3   Status On-going      PT SHORT TERM GOAL #4   Status On-going               PT Long Term Goals - 06/19/21 1053       PT LONG TERM GOAL #1   Title Patient will achieve a TUG score of at least 16s or less without significant lateral gait deviation and with use of AD.    Time 6    Period Weeks    Status On-going      PT LONG TERM GOAL #2   Title Patient will be able to ambulate at least 358ft with use of AD and supervision and without moderate lateral gait deviation.    Time 6    Period Weeks    Status On-going    Target Date 07/25/21      PT LONG TERM GOAL #3   Title Patient will ambulate at 1.0 m/s during 2MWT with LRAD and no signs of ataxia to demonstrate reduced fall risk and improved community mobility access.    Status On-going      PT LONG TERM GOAL #4   Title Patient will improve TUG score by 6 seconds to demosntrate improve balance with gait and improve 5x sit to stand time by 6 seconds to demonstrate improve LE strength and improve function with transitional movements.    Time 6    Status On-going                   Plan - 06/27/21 1538     Clinical Impression Statement Pt with noted dizziness again when entering clinic.  Resumed standing exercises and began static tandem with noted challenge unable to maintain greater than 5 with either LE leading.   No LOB during session, however tends to take short choppy steps and lean into flexion with ambulation.  Cues reduces but does not correct totally. Improved transfer with logroll, however cues needed as tends to revert to habituall way of transferring.  Pt with difficutly completing sit to stands with cues needed to power up from glutes.  Pt will continue to benefit from skilled physical therapy.  Personal Factors and Comorbidities Age;Comorbidity 2;Comorbidity 3+    Comorbidities Hydrocephalus, chronic back pain, frequent falls, vision  problems, COPD, and hx of breast cx.    Examination-Activity Limitations Transfers;Locomotion Level;Stairs;Stand;Squat;Carry;Bend    Examination-Participation Restrictions Church;Cleaning;Community Activity;Driving;Valla Leaver [facility]    Stability/Clinical Decision Making Stable/Uncomplicated    Rehab Potential Fair    PT Frequency 2x / week    PT Duration 6 weeks    PT Treatment/Interventions Therapeutic exercise;Gait training;Functional mobility training;Therapeutic activities;Neuromuscular re-education;Balance training;DME Instruction;ADLs/Self Care Home Management    PT Next Visit Plan Continue with strengthening of the LEs, generalized balance tasks, transfer safety and mechanics, and use of AD as needed.    PT Home Exercise Plan Seated March, Seated Long Arc Quad, and Seated Hamstring Set; 1/18:  tall sitting, sit to sidelying, rolling, bridge    Consulted and Agree with Plan of Care Patient             Patient will benefit from skilled therapeutic intervention in order to improve the following deficits and impairments:  Abnormal gait, Decreased balance, Difficulty walking, Decreased strength, Decreased coordination, Improper body mechanics, Decreased safety awareness, Decreased activity tolerance, Decreased knowledge of use of DME  Visit Diagnosis: Unsteady gait  Muscle weakness (generalized)  Other abnormalities of gait and mobility     Problem List Patient Active Problem List   Diagnosis Date Noted   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 06/15/2021   Asymptomatic bilateral carotid artery stenosis 05/17/2021   Ectopic atrial rhythm 05/17/2021   Syncope and collapse 05/17/2021   H/O: CVA (cerebrovascular accident) 11/01/2019   Bleeding from varicose vein 01/05/2019   Dizziness 01/03/2018   DDD (degenerative disc disease), lumbar 08/31/2017   Generalized OA 08/31/2017   Venous hypertension of both lower extremities 07/03/2017   Osteopenia 05/13/2016   History of left  breast cancer 05/13/2016   Glaucoma 05/13/2016   Hyperlipidemia 05/13/2016   Urinary incontinence 05/13/2016   Tobacco use disorder 05/13/2016   GERD (gastroesophageal reflux disease) 06/27/2013   H/O: upper GI bleed 06/27/2013   Epigastric pain 06/27/2013   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

## 2021-07-01 ENCOUNTER — Ambulatory Visit (HOSPITAL_COMMUNITY): Payer: Medicare HMO | Admitting: Physical Therapy

## 2021-07-01 ENCOUNTER — Encounter (HOSPITAL_COMMUNITY): Payer: Self-pay | Admitting: Physical Therapy

## 2021-07-01 ENCOUNTER — Other Ambulatory Visit: Payer: Self-pay

## 2021-07-01 DIAGNOSIS — R2689 Other abnormalities of gait and mobility: Secondary | ICD-10-CM

## 2021-07-01 DIAGNOSIS — R2681 Unsteadiness on feet: Secondary | ICD-10-CM

## 2021-07-01 DIAGNOSIS — M6281 Muscle weakness (generalized): Secondary | ICD-10-CM

## 2021-07-01 NOTE — Therapy (Signed)
[place], [place], [address] Phone: [phone]   Fax:  [phone]  Physical Therapy Treatment  Patient Details  Name: [name] MRN: [id number] Date of Birth: 09-Oct-1945 Referring Provider (PT): Cameron Sprang, MD   Encounter Date: 07/01/2021   PT End of Session - 07/01/21 1451     Visit Number 5    Number of Visits 12    Date for PT Re-Evaluation 07/25/21    Authorization Type Primary: [employer] Medicare(Auth required) // Secondary: United [employer](Follows medicare guidelines)    Authorization Time Period 12 visits requested - 12 visits authorized thru 06/01/2022    Authorization - Visit Number 5    Authorization - Number of Visits 12    Progress Note Due on Visit 10    PT Start Time 8099    PT Stop Time 8338    PT Time Calculation (min) 39 min    Equipment Utilized During Treatment Gait belt    Activity Tolerance Patient tolerated treatment well    Behavior During Therapy [facility] for tasks assessed/performed             Past Medical History:  Diagnosis Date   Arthritis    Breast cancer (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 06/15/2021   Macular degeneration    bilateral   Personal history of radiation therapy 2003   Pulmonary embolism ([place])    Syncope and collapse 05/17/2021   Tachycardia     Past Surgical History:  Procedure Laterality Date   BACK SURGERY     BREAST LUMPECTOMY     COLONOSCOPY  01/28/2012   Procedure: COLONOSCOPY;  Surgeon: Rogene Houston, MD;  [facility];  Service: Endoscopy;  Laterality: N/A;  1200   COLONOSCOPY  01-2012   ESOPHAGOGASTRODUODENOSCOPY N/A 07/01/2013   Procedure:  ESOPHAGOGASTRODUODENOSCOPY (EGD);  Surgeon: Rogene Houston, MD;  [facility];  Service: Endoscopy;  Laterality: N/A;  1:10   LAPAROSCOPIC TUBAL LIGATION  1970   TONSILLECTOMY     Patient was age 76    There were no vitals filed for this visit.   Subjective Assessment - 07/01/21 1452     Subjective Her knee is not hurting as bad today. no falls lately. Feels minimal progress so far.    Currently in Pain? No/denies                               [facility] Adult PT Treatment/Exercise - 07/01/21 0001       Knee/Hip Exercises: Standing   Heel Raises Both;15 reps    Heel Raises Limitations toeraises 15X    Knee Flexion Both;2 sets;10 reps    Hip Flexion Both;2 sets;10 reps    Hip Flexion Limitations alternating march    Hip Abduction Both;2 sets;10 reps    Forward Step Up Both;1 set;10 reps;Hand Hold: 1;Step Height: 4"      Knee/Hip Exercises: Seated   Long Arc Quad Both;10 reps    Long Arc Quad Limitations 5 second holds    Sit to [employer] 5 reps;without UE support   with black foam  PT Short Term Goals - 06/19/21 1052       PT SHORT TERM GOAL #1   Title Patient will be able to complete a sit to stand transfer within 3s of a given verbal cue with UE assistance.    Time 2    Period Weeks    Status On-going    Target Date 06/27/21      PT SHORT TERM GOAL #2   Title Patient will be able to ambulate at least 33ft with AD with supervision and without LOB or lateral gait deviation.    Time 3    Period Weeks    Status On-going    Target Date 07/04/21      PT SHORT TERM GOAL #3   Status On-going      PT SHORT TERM GOAL #4   Status On-going               PT Long Term Goals - 06/19/21 1053       PT LONG TERM GOAL #1   Title Patient will achieve a TUG score of at least 16s or less without significant lateral gait deviation and with use of AD.    Time 6    Period Weeks    Status On-going      PT LONG TERM  GOAL #2   Title Patient will be able to ambulate at least 374ft with use of AD and supervision and without moderate lateral gait deviation.    Time 6    Period Weeks    Status On-going    Target Date 07/25/21      PT LONG TERM GOAL #3   Title Patient will ambulate at 1.0 m/s during 2MWT with LRAD and no signs of ataxia to demonstrate reduced fall risk and improved community mobility access.    Status On-going      PT LONG TERM GOAL #4   Title Patient will improve TUG score by 6 seconds to demosntrate improve balance with gait and improve 5x sit to stand time by 6 seconds to demonstrate improve LE strength and improve function with transitional movements.    Time 6    Status On-going                   Plan - 07/01/21 1452     Clinical Impression Statement Patient tolerating increased reps of previously completed exercises. Continued with LE strengthening and balance exercises which are tolerated well. Patient given cueing for reducing UE support as able to requires unilateral HHA for most exercises completed. She tends to hunch over to look at feet despite frequent cueing for posture. Patient will continue to benefit from skilled physical therapy in order to reduce impairment and improve function.    Personal Factors and Comorbidities Age;Comorbidity 2;Comorbidity 3+    Comorbidities Hydrocephalus, chronic back pain, frequent falls, vision problems, COPD, and hx of breast cx.    Examination-Activity Limitations Transfers;Locomotion Level;Stairs;Stand;Squat;Carry;Bend    Examination-Participation Restrictions Church;Cleaning;Community Activity;Driving;Valla Leaver [facility]    Stability/Clinical Decision Making Stable/Uncomplicated    Rehab Potential Fair    PT Frequency 2x / week    PT Duration 6 weeks    PT Treatment/Interventions Therapeutic exercise;Gait training;Functional mobility training;Therapeutic activities;Neuromuscular re-education;Balance training;DME  Instruction;ADLs/Self Care Home Management    PT Next Visit Plan Continue with strengthening of the LEs, generalized balance tasks, transfer safety and mechanics, and use of AD as needed.    PT Home Exercise Plan Seated March, Seated Long [address] [place], and Seated  Hamstring Set; 1/18:  tall sitting, sit to sidelying, rolling, bridge    Consulted and Agree with Plan of Care Patient             Patient will benefit from skilled therapeutic intervention in order to improve the following deficits and impairments:  Abnormal gait, Decreased balance, Difficulty walking, Decreased strength, Decreased coordination, Improper body mechanics, Decreased safety awareness, Decreased activity tolerance, Decreased knowledge of use of DME  Visit Diagnosis: Unsteady gait  Muscle weakness (generalized)  Other abnormalities of gait and mobility     Problem List Patient Active Problem List   Diagnosis Date Noted   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 06/15/2021   Asymptomatic bilateral carotid artery stenosis 05/17/2021   Ectopic atrial rhythm 05/17/2021   Syncope and collapse 05/17/2021   H/O: CVA (cerebrovascular accident) 11/01/2019   Bleeding from varicose vein 01/05/2019   Dizziness 01/03/2018   DDD (degenerative disc disease), lumbar 08/31/2017   Generalized OA 08/31/2017   Venous hypertension of both lower extremities 07/03/2017   Osteopenia 05/13/2016   History of left breast cancer 05/13/2016   Glaucoma 05/13/2016   Hyperlipidemia 05/13/2016   Urinary incontinence 05/13/2016   Tobacco use disorder 05/13/2016   GERD (gastroesophageal reflux disease) 06/27/2013   H/O: upper GI bleed 06/27/2013   Epigastric pain 06/27/2013   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

## 2021-07-02 ENCOUNTER — Encounter (HOSPITAL_COMMUNITY): Payer: Medicare HMO | Admitting: Physical Therapy

## 2021-07-04 ENCOUNTER — Ambulatory Visit (HOSPITAL_COMMUNITY): Payer: Medicare HMO | Attending: Neurology | Admitting: Physical Therapy

## 2021-07-04 ENCOUNTER — Other Ambulatory Visit: Payer: Self-pay

## 2021-07-04 DIAGNOSIS — R2689 Other abnormalities of gait and mobility: Secondary | ICD-10-CM

## 2021-07-04 DIAGNOSIS — M6281 Muscle weakness (generalized): Secondary | ICD-10-CM | POA: Diagnosis not present

## 2021-07-04 DIAGNOSIS — R2681 Unsteadiness on feet: Secondary | ICD-10-CM

## 2021-07-04 NOTE — Therapy (Signed)
[place], [place], [address] Phone: [phone]   Fax:  [phone]  Physical Therapy Treatment  Patient Details  Name: [name] MRN: [id number] Date of Birth: Oct 01, 1945 Referring Provider (PT): Cameron Sprang, MD   Encounter Date: 07/04/2021   PT End of Session - 07/04/21 1744     Visit Number 6    Number of Visits 12    Date for PT Re-Evaluation 07/25/21    Authorization Type Primary: [employer](Auth required) // Secondary: [employer](Follows medicare guidelines)    Authorization Time Period 12 visits requested - 12 visits authorized thru 06/01/2022    Authorization - Visit Number 6    Authorization - Number of Visits 12    Progress Note Due on Visit 10    PT Start Time 1452    PT Stop Time 2376    PT Time Calculation (min) 40 min    Equipment Utilized During Treatment Gait belt    Activity Tolerance Patient tolerated treatment well    Behavior During Therapy [facility] for tasks assessed/performed             Past Medical History:  Diagnosis Date   Arthritis    Breast cancer ([place])    left breast/lumpectomy/chemo/rad 2003   COPD (chronic obstructive pulmonary disease) ([place])    DDD (degenerative disc disease), lumbar 08/31/2017   Diverticulitis    GERD (gastroesophageal reflux disease)    Hemoptysis    Hyperlipidemia    Iron deficiency    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 06/15/2021   Macular degeneration    bilateral   Personal history of radiation therapy 2003   Pulmonary embolism ([place])    Syncope and collapse 05/17/2021   Tachycardia     Past Surgical History:  Procedure Laterality Date   BACK SURGERY     BREAST LUMPECTOMY     COLONOSCOPY  01/28/2012   Procedure: COLONOSCOPY;  Surgeon: Rogene Houston, MD;  [facility];  Service: Endoscopy;  Laterality: N/A;  1200   COLONOSCOPY  01-2012   ESOPHAGOGASTRODUODENOSCOPY N/A 07/01/2013   Procedure:  ESOPHAGOGASTRODUODENOSCOPY (EGD);  Surgeon: Rogene Houston, MD;  Location: [facility];  Service: Endoscopy;  Laterality: N/A;  1:10   LAPAROSCOPIC TUBAL LIGATION  1970   TONSILLECTOMY     Patient was age 76    There were no vitals filed for this visit.   Subjective Assessment - 07/04/21 1501     Subjective Pt states she is having a bad day today with her head and dizziness.  Secretary reported she almost fell when she came in today.    Currently in Pain? No/denies                               [facility] Adult PT Treatment/Exercise - 07/04/21 0001       Knee/Hip Exercises: Standing   Heel Raises Both;15 reps    Heel Raises Limitations toeraises 15X    Knee Flexion Both;2 sets;10 reps    Hip Flexion Both;2 sets;10 reps    Hip Flexion Limitations alternating march    Hip Abduction Both;2 sets;10 reps    Hip Extension Both;10 reps                       PT Short Term Goals - 06/19/21 1052       PT SHORT TERM GOAL #1  Title Patient will be able to complete a sit to stand transfer within 3s of a given verbal cue with UE assistance.    Time 2    Period Weeks    Status On-going    Target Date 06/27/21      PT SHORT TERM GOAL #2   Title Patient will be able to ambulate at least 4ft with AD with supervision and without LOB or lateral gait deviation.    Time 3    Period Weeks    Status On-going    Target Date 07/04/21      PT SHORT TERM GOAL #3   Status On-going      PT SHORT TERM GOAL #4   Status On-going               PT Long Term Goals - 06/19/21 1053       PT LONG TERM GOAL #1   Title Patient will achieve a TUG score of at least 16s or less without significant lateral gait deviation and with use of AD.    Time 6    Period Weeks    Status On-going      PT LONG TERM GOAL #2   Title Patient will be able to ambulate at least 359ft with use of AD and supervision and without moderate lateral gait deviation.    Time 6    Period  Weeks    Status On-going    Target Date 07/25/21      PT LONG TERM GOAL #3   Title Patient will ambulate at 1.0 m/s during 2MWT with LRAD and no signs of ataxia to demonstrate reduced fall risk and improved community mobility access.    Status On-going      PT LONG TERM GOAL #4   Title Patient will improve TUG score by 6 seconds to demosntrate improve balance with gait and improve 5x sit to stand time by 6 seconds to demonstrate improve LE strength and improve function with transitional movements.    Time 6    Status On-going                   Plan - 07/04/21 1745     Clinical Impression Statement Continued with LE strengthening and stability exercises.  Pt given option of standing or sitting/supine and states she wanted to work through the dizziness today.  Pt able to complete all exercises with only 2 seated rest breaks and no LOB/dizziness reported.  Pt did have to use at least 1 UE at all times.    Personal Factors and Comorbidities Age;Comorbidity 2;Comorbidity 3+    Comorbidities Hydrocephalus, chronic back pain, frequent falls, vision problems, COPD, and hx of breast cx.    Examination-Activity Limitations Transfers;Locomotion Level;Stairs;Stand;Squat;Carry;Bend    Examination-Participation Restrictions Church;Cleaning;Community Activity;Driving;[facility]    Stability/Clinical Decision Making Stable/Uncomplicated    Rehab Potential Fair    PT Frequency 2x / week    PT Duration 6 weeks    PT Treatment/Interventions Therapeutic exercise;Gait training;Functional mobility training;Therapeutic activities;Neuromuscular re-education;Balance training;DME Instruction;ADLs/Self Care Home Management    PT Next Visit Plan Continue with strengthening of the LEs, generalized balance tasks, transfer safety and mechanics, and use of AD as needed.    PT Home Exercise Plan Seated March, Seated Long Arc Quad, and Seated Hamstring Set; 1/18:  tall sitting, sit to sidelying, rolling,  bridge    Consulted and Agree with Plan of Care Patient  Patient will benefit from skilled therapeutic intervention in order to improve the following deficits and impairments:  Abnormal gait, Decreased balance, Difficulty walking, Decreased strength, Decreased coordination, Improper body mechanics, Decreased safety awareness, Decreased activity tolerance, Decreased knowledge of use of DME  Visit Diagnosis: Unsteady gait  Other abnormalities of gait and mobility  Muscle weakness (generalized)     Problem List Patient Active Problem List   Diagnosis Date Noted   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 06/15/2021   Asymptomatic bilateral carotid artery stenosis 05/17/2021   Ectopic atrial rhythm 05/17/2021   Syncope and collapse 05/17/2021   H/O: CVA (cerebrovascular accident) 11/01/2019   Bleeding from varicose vein 01/05/2019   Dizziness 01/03/2018   DDD (degenerative disc disease), lumbar 08/31/2017   Generalized OA 08/31/2017   Venous hypertension of both lower extremities 07/03/2017   Osteopenia 05/13/2016   History of left breast cancer 05/13/2016   Glaucoma 05/13/2016   Hyperlipidemia 05/13/2016   Urinary incontinence 05/13/2016   Tobacco use disorder 05/13/2016   GERD (gastroesophageal reflux disease) 06/27/2013   H/O: upper GI bleed 06/27/2013   Epigastric pain 06/27/2013   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

## 2021-07-08 ENCOUNTER — Encounter (HOSPITAL_COMMUNITY): Payer: Medicare HMO | Admitting: Physical Therapy

## 2021-07-09 ENCOUNTER — Ambulatory Visit (HOSPITAL_COMMUNITY): Payer: Medicare HMO | Admitting: Physical Therapy

## 2021-07-10 ENCOUNTER — Ambulatory Visit (HOSPITAL_COMMUNITY): Payer: Medicare HMO | Admitting: Physical Therapy

## 2021-07-14 DIAGNOSIS — R55 Syncope and collapse: Secondary | ICD-10-CM | POA: Diagnosis not present

## 2021-07-14 DIAGNOSIS — Z4509 Encounter for adjustment and management of other cardiac device: Secondary | ICD-10-CM | POA: Diagnosis not present

## 2021-07-14 DIAGNOSIS — Z95818 Presence of other cardiac implants and grafts: Secondary | ICD-10-CM | POA: Diagnosis not present

## 2021-07-15 ENCOUNTER — Telehealth: Payer: Self-pay | Admitting: Cardiology

## 2021-07-15 ENCOUNTER — Telehealth (HOSPITAL_COMMUNITY): Payer: Self-pay | Admitting: Occupational Therapy

## 2021-07-15 NOTE — Telephone Encounter (Signed)
She is in so much pain she can not sleep; stand up or walk. She will see Dr. Aline Brochure on 07/2021 and call us back about her future apptments.

## 2021-07-15 NOTE — Telephone Encounter (Signed)
Please inform patient that her loop is transmitting normally. We will continue to monitor and if any significant abnormality, will contact the patient. She should also contact us if dizziness or near syncope spell.   JG

## 2021-07-16 ENCOUNTER — Encounter (HOSPITAL_COMMUNITY): Payer: Medicare HMO | Admitting: Physical Therapy

## 2021-07-17 NOTE — Telephone Encounter (Signed)
Called and spoke with patient regarding her loop transmissions. Patient advised to contact us if dizziness or near syncope spells occur.

## 2021-07-18 ENCOUNTER — Encounter (HOSPITAL_COMMUNITY): Payer: Medicare HMO | Admitting: Physical Therapy

## 2021-07-22 ENCOUNTER — Ambulatory Visit (INDEPENDENT_AMBULATORY_CARE_PROVIDER_SITE_OTHER): Payer: Medicare HMO | Admitting: Orthopedic Surgery

## 2021-07-22 ENCOUNTER — Encounter: Payer: Self-pay | Admitting: Orthopedic Surgery

## 2021-07-22 ENCOUNTER — Other Ambulatory Visit: Payer: Self-pay

## 2021-07-22 VITALS — BP 152/64 | HR 76 | Ht 68.0 in | Wt 150.0 lb

## 2021-07-22 DIAGNOSIS — M545 Low back pain, unspecified: Secondary | ICD-10-CM | POA: Diagnosis not present

## 2021-07-22 DIAGNOSIS — M79604 Pain in right leg: Secondary | ICD-10-CM

## 2021-07-22 DIAGNOSIS — M5431 Sciatica, right side: Secondary | ICD-10-CM

## 2021-07-22 MED ORDER — PREDNISONE 10 MG (48) PO TBPK: ORAL_TABLET | Freq: Every day | ORAL | 0 refills | Status: DC

## 2021-07-22 MED ORDER — GABAPENTIN 100 MG PO CAPS
100.0000 mg | ORAL_CAPSULE | Freq: Three times a day (TID) | ORAL | 2 refills | Status: DC
Start: 2021-07-22 — End: 2022-01-02

## 2021-07-22 NOTE — Patient Instructions (Signed)
Heat is okay to use on your back/ leg  pain if it helps / if it doesn't help try ice Will send in prescription for steroid taper and Gabapentin

## 2021-07-22 NOTE — Progress Notes (Signed)
Chief Complaint  Patient presents with   Knee Pain    RT/ pain is getting better, not hurting as bad now as it was when she scheduled the appt   76 year old female with history of longstanding back pain previous laminectomy she has a history of hydrocephalus presents with right leg pain which initially started as right knee pain progressively worsened to include pain in the entire right leg it is subsequently somewhat resolved with some residual lateral leg pain  Her exam is consistent with sciatica  Her knee exam was benign  She did complain of some groin pain but her hip range of motion was normal  She also has some peripheral venous disease causing some swelling in the ankle  Recommend prednisone Dosepak and gabapentin  Meds ordered this encounter  Medications   predniSONE (STERAPRED UNI-PAK 48 TAB) 10 MG (48) TBPK tablet    Sig: Take by mouth daily.    Dispense:  48 tablet    Refill:  0   gabapentin (NEURONTIN) 100 MG capsule    Sig: Take 1 capsule (100 mg total) by mouth 3 (three) times daily.    Dispense:  90 capsule    Refill:  2

## 2021-07-23 ENCOUNTER — Ambulatory Visit (HOSPITAL_COMMUNITY): Payer: Medicare HMO | Admitting: Physical Therapy

## 2021-07-25 ENCOUNTER — Encounter (HOSPITAL_COMMUNITY): Payer: Medicare HMO | Admitting: Physical Therapy

## 2021-08-07 ENCOUNTER — Telehealth: Payer: Self-pay | Admitting: Cardiology

## 2021-08-12 ENCOUNTER — Encounter (HOSPITAL_COMMUNITY): Payer: Medicare HMO | Admitting: Physical Therapy

## 2021-08-12 NOTE — Telephone Encounter (Signed)
Reminder to schedule BIOTRONIK to come out.

## 2021-08-14 DIAGNOSIS — Z95818 Presence of other cardiac implants and grafts: Secondary | ICD-10-CM | POA: Diagnosis not present

## 2021-08-14 DIAGNOSIS — Z4509 Encounter for adjustment and management of other cardiac device: Secondary | ICD-10-CM | POA: Diagnosis not present

## 2021-08-14 DIAGNOSIS — R55 Syncope and collapse: Secondary | ICD-10-CM | POA: Diagnosis not present

## 2021-08-15 ENCOUNTER — Ambulatory Visit: Payer: Medicare HMO | Admitting: Cardiology

## 2021-08-21 ENCOUNTER — Telehealth: Payer: Self-pay | Admitting: Cardiology

## 2021-08-21 ENCOUNTER — Other Ambulatory Visit: Payer: Self-pay

## 2021-08-21 DIAGNOSIS — I6523 Occlusion and stenosis of bilateral carotid arteries: Secondary | ICD-10-CM

## 2021-08-21 MED ORDER — ATORVASTATIN CALCIUM 20 MG PO TABS: 20.0000 mg | ORAL_TABLET | Freq: Every day | ORAL | 3 refills | Status: DC

## 2021-08-21 MED ORDER — CLOPIDOGREL BISULFATE 75 MG PO TABS: 75.0000 mg | ORAL_TABLET | Freq: Every day | ORAL | 3 refills | Status: DC

## 2021-08-21 NOTE — Telephone Encounter (Signed)
Patient says 2 prescriptions were sent to the wrong pharmacy. The 2 prescriptions are for plavix and atorvastatin, and they need to be sent to the [facility]. Also, she says the instructions on her atorvastatin are incorrect - should read take 1 tablet daily (20 MG), instead it says to take two half tablets daily. She was originally on 10 MG and this changed to 20 MG. ?

## 2021-08-21 NOTE — Telephone Encounter (Signed)
Spoke to patient medications have been sent to correct pharmacy

## 2021-08-30 NOTE — Telephone Encounter (Signed)
Called patient, to see if she can come in on one of our "Loop Implant" days to have a loop check while Biotronik rep is here: ? ?4/3 @  1:00 ?4/5 @ 11:30 ?4/12 @ 8:00

## 2021-09-02 NOTE — Telephone Encounter (Signed)
Called patient, to see if she can come in on one of our "Loop Implant" days to have a loop check while Biotron[employer] rep is here: ?? ?4/3 @  1:00 ?4/5 @ 11:30 ?4/12 @ 8:00 ?

## 2021-09-12 ENCOUNTER — Ambulatory Visit: Payer: Medicare HMO | Admitting: Cardiology

## 2021-09-12 ENCOUNTER — Encounter: Payer: Self-pay | Admitting: Cardiology

## 2021-09-12 VITALS — BP 129/70 | HR 81 | Temp 98.3°F | Resp 17 | Ht 68.0 in | Wt 146.8 lb

## 2021-09-12 DIAGNOSIS — R55 Syncope and collapse: Secondary | ICD-10-CM | POA: Diagnosis not present

## 2021-09-12 DIAGNOSIS — Z95818 Presence of other cardiac implants and grafts: Secondary | ICD-10-CM | POA: Diagnosis not present

## 2021-09-12 DIAGNOSIS — I951 Orthostatic hypotension: Secondary | ICD-10-CM

## 2021-09-12 DIAGNOSIS — Z4509 Encounter for adjustment and management of other cardiac device: Secondary | ICD-10-CM | POA: Diagnosis not present

## 2021-09-12 NOTE — Progress Notes (Signed)
Chief Complaint  ?Patient presents with  ? loop check  ?  ? ?Brief History: [name] is a 76 y.o. 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  Patient underwent loop recorder implantation 06/14/2021. ? ?She now presents to the office for evaluation of dizziness, syncope and also reprogramming of loop recorder due to falls frequent atrial fibrillation alerts.  Remains asymptomatic otherwise. ? ? ?  09/12/2021  ? 12:45 PM 07/22/2021  ?  1:45 PM 06/24/2021  ?  1:51 PM  ?Vitals with BMI  ?Height '5\' 8"'$  '5\' 8"'$  '5\' 8"'$   ?Weight 146 lbs 13 oz 150 lbs 151 lbs 10 oz  ?BMI 22.33 22.81 23.06  ?Systolic 568 127 517  ?Diastolic 70 64 55  ?Pulse 81 76 70  ? ? ?Blood pressure 129/70, pulse 81, temperature 98.3 ?F (36.8 ?C), temperature source Temporal, resp. rate 17, height '5\' 8"'$  (1.727 m), weight 146 lb 12.8 oz (66.6 kg), SpO2 98 %.  ? ?Physical Exam ?Neck:  ?   Vascular: No JVD.  ?Cardiovascular:  ?   Rate and Rhythm: Normal rate and regular rhythm.  ?   Pulses: Intact distal pulses.  ?   Heart sounds: Normal heart sounds. No murmur heard. ?  No gallop.  ?Pulmonary:  ?   Effort: Pulmonary effort is normal.  ?   Breath sounds: Normal breath sounds.  ?Abdominal:  ?   General: Bowel sounds are normal.  ?   Palpations: Abdomen is soft.  ?Musculoskeletal:  ?   Right lower leg: No edema.  ?   Left lower leg: No edema.  ?  ? ?Prodedure performed: In person Loop recorder programming. ? ?Device setting: ? ?R wave amplitude 1.31 mV.  ? ?Programmed:  ? ?AF detection to ON. Turn off A. Fib transmissions.  Confirmation time changed from 10 min to 30 minutes. ?HVR 140/min after 48 beats. ?Bradycardia 40 BPM for 15 seconds. ?Sudden rate drop 50 bpm ?Asystole 3 Sec ?Patient Trigger: On ? ?There were 14 A. Fib with RVR episodes for 2.5 minutes longest. No sustained episodes. No asystole. PAC and PVC noted. Noise 0% ? ?Encounter for loop recorder check ? ?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 ? ?Syncope and collapse ? ?Orthostatic hypotension ? ?Recommendation: We will continue to monitor her loop recorder for atrial fibrillation burden.  Presently the burden is very low and there are false burden given the overall burden of 13% related to PACs and PVCs.  She has had very brief atrial fibrillation episodes with high ventricular rate.  No need for anticoagulation for now. ? ?She has not had any further significant dizziness or syncope.  No changes in the medications were done today.  Loop recorder has been reprogrammed.  I will see her back as previously scheduled in August 2023.  I have reassured the patient. ? ? ?Adrian Prows, MD, [facility] ?09/12/2021, 1:26 PM ?Office: [phone] ?Fax: [phone] ?Pager: [phone]  ? ?

## 2021-09-14 DIAGNOSIS — R55 Syncope and collapse: Secondary | ICD-10-CM | POA: Diagnosis not present

## 2021-09-14 DIAGNOSIS — Z95818 Presence of other cardiac implants and grafts: Secondary | ICD-10-CM | POA: Diagnosis not present

## 2021-09-14 DIAGNOSIS — Z4509 Encounter for adjustment and management of other cardiac device: Secondary | ICD-10-CM | POA: Diagnosis not present

## 2021-09-23 ENCOUNTER — Ambulatory Visit: Payer: Medicare HMO | Admitting: Cardiology

## 2021-10-15 DIAGNOSIS — R55 Syncope and collapse: Secondary | ICD-10-CM | POA: Diagnosis not present

## 2021-10-15 DIAGNOSIS — Z4509 Encounter for adjustment and management of other cardiac device: Secondary | ICD-10-CM | POA: Diagnosis not present

## 2021-10-15 DIAGNOSIS — Z95818 Presence of other cardiac implants and grafts: Secondary | ICD-10-CM | POA: Diagnosis not present

## 2021-11-05 DIAGNOSIS — H35372 Puckering of macula, left eye: Secondary | ICD-10-CM | POA: Diagnosis not present

## 2021-11-05 DIAGNOSIS — H3581 Retinal edema: Secondary | ICD-10-CM | POA: Diagnosis not present

## 2021-11-05 DIAGNOSIS — H353232 Exudative age-related macular degeneration, bilateral, with inactive choroidal neovascularization: Secondary | ICD-10-CM | POA: Diagnosis not present

## 2021-11-06 ENCOUNTER — Ambulatory Visit (INDEPENDENT_AMBULATORY_CARE_PROVIDER_SITE_OTHER): Payer: Medicare HMO | Admitting: Physician Assistant

## 2021-11-06 ENCOUNTER — Encounter: Payer: Self-pay | Admitting: Physician Assistant

## 2021-11-06 VITALS — BP 138/65 | HR 59 | Resp 18 | Ht 68.0 in | Wt 151.0 lb

## 2021-11-06 DIAGNOSIS — R2689 Other abnormalities of gait and mobility: Secondary | ICD-10-CM

## 2021-11-06 DIAGNOSIS — G912 (Idiopathic) normal pressure hydrocephalus: Secondary | ICD-10-CM | POA: Diagnosis not present

## 2021-11-06 NOTE — Progress Notes (Signed)
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  The etiology was unclear, and there were no prodromal symptoms Prior work-up with neurosurgery included a lumbar drain trial, for possible NPH, but there was no improvement, and it was felt that she was not a candidate for a drain.  At the time, MRI of the brain with and without contrast showed evidence of small subacute white matter lacunar infarct in the right centrum semiovale, without associated hemorrhage or mass-effect.  Elsewhere, there was stable chronic white matter signal changes and multiple small chronic infarcts in the cerebellum.  In addition, a chronic L third ventriculomegaly was noted again, but is stable since 2019.  CT a head and neck showed mild atherosclerotic disease in both carotid bulbs, and moderate focal stenosis in the left M1.  1 hour EEG was without seizure.  She was seen by cardiology, Dr. Einar Gip, at which time EKG showed ectopic atrial rhythm, and incomplete right BBB, without ischemia, and normal QT interval.  Recently, she underwent loop recorder implantation on 06/14/2021 for atrial fibrillation burden, She did have very brief atrial fibrillation episodes with high ventricular rate Patient tried PT and reports that " it was tough, couldn't do it" .No signs of stroke such as unilateral weakness or numbness, dysarthria, however, she does demonstrate slower pace when ambulating, some shuffling (patient reports this is worse than prior), increased incontinence. Her memory is stable.   Patient declines PT at this time Repeat MRI brain to compare with prior for  evaluation of NPH progression, other structural abnormalities and vascular load Follow up in 6 months, sooner if needed   Subjective:   Since her last visit on 05/08/2021 the patient reports that her gait instability is still present, especially when trying to get up from a chair.  She reports as "is such a weird feeling, it is not dizziness, or vertigo, but I cannot move my feet as I want them ".  Her husband reports that she continues to move very slowly, shuffling "a little more ", and when she turns around, she has to "take her time ".  She has 2 hold onto objects, or use a walker to move.  She denies any dizziness today.  She has urine incontinence, which may be worse than prior, and she needs adult diapers.  In the night, she has to get up 2 or 3 times, "but one day I had to get up every hour ".  Her memory is stable.  She denies any vision changes, dysarthria, or dysphagia.  Denies any unilateral weakness or sensory deficiencies.  She took a low dose gabapentin and steroid taper for back and leg pain with relief.  She denies any falls since her last visit.  No further presentations to the hospital for TIA.  Denies any confusion.  No seizures are reported.  She denies any chest pain, palpitations, or shortness of breath.  She is under the care of cardiology, which continues to evaluate her for any arrhythmias.  No further syncopal episodes.  Denies any abdominal pain or diarrhea.  Denies lower extremity swelling.  Denies neuropathy.  She is compliant with her medications.  She is no longer driving.   HISTORY OF PRESENT ILLNESS: This is a 76 year old right-handed woman with a history of pulmonary embolism, breast cancer, TIA, COPD, presenting for evaluation of syncope last 03/27/2021. She was in her usual state of health and had driven home from the bank, she recalls turning into her driveway, then waking up under her car. She denies any warning symptoms, she woke up on her stomach with half her body  under the car. She turned and saw the underside of the car, taking 10-15 minutes to figure out how she would get out. She reports the car was not running, her door was open with her pocketbook, cane, and glasses strewn around. She denied any focal weakness, no tongue bite or incontinence. Her neighbor saw her sitting on the ground and helped her up. She had some neck pain in the ER.  CBC, BMP normal. She had a CT head without contrast which I personally reviewed, no acute changes, ventricle enlarged, out of proportion to degree of parenchymal volume loss, no change from prior imaging in 12/2020. CTA head and neck showed mild atherosclerotic disease in both carotid bulbs, moderate focal stenosis in left M1. EKG showed NSR. She denies any prior episodes of loss of consciousness. She has had frequent falls. She had a bad fall in August where she hit her head and her right arm with "blood everywhere," she remembers everything from that fall. There have been 2 more falls in between August and most recent one, both occurring when she gets up from a chair. She has been evaluated by 2 neurologists and neurosurgeons [facility] and [facility]) for normal pressure hydrocephalus, she was admitted at [facility] in 09/2019 for lumbar drain trial, 3-day trial was unsuccessful and she did not meet criteria for shunt. They note her balance has been bad or "maybe worse." She has to hold on before she starts moving with her cane. Her back and knee pain also make it hard to stand up. She reported dizziness at [facility], felt due to orthostatic hypotension. She thinks she is usually hydrated, her husband disagrees and states she mostly drinks green tea. She reports losing her train of thought midsentence, "my head feels crazy." Her husband denies any staring spells, but she feels there are times "I'm there but I'm not there." She has urinary incontinence and wears adult diapers. No family history of seizures.   She has  had TIA in the past and is presently on antihyperlipidemic therapy and also antiplatelet therapy, continue the same for now.  She is presently actively smoking.  She does not appear to be interested in smoking cessation. ( Dr. Einar Gip)      PAST MEDICAL HISTORY: Past Medical History:  Diagnosis Date   Arthritis    Breast cancer ([place])    left breast/lumpectomy/chemo/rad 2003   COPD (chronic obstructive pulmonary disease) ([place])    DDD (degenerative disc disease), lumbar 08/31/2017   Diverticulitis    GERD (gastroesophageal reflux disease)    Hemoptysis    Hyperlipidemia    Iron deficiency    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 06/15/2021   Macular degeneration    bilateral   Personal history of radiation therapy 2003   Pulmonary embolism ([place])    Syncope and collapse 05/17/2021   Tachycardia     MEDICATIONS: Current Outpatient Medications on File Prior to Visit  Medication Sig Dispense Refill   atorvastatin (LIPITOR) 20 MG tablet Take 1 tablet (20 mg total) by  mouth daily. 90 tablet 3   CALCIUM PO Take 1 tablet by mouth daily.     Cholecalciferol (VITAMIN D PO) Take 1 tablet by mouth daily.     clopidogrel (PLAVIX) 75 MG tablet Take 1 tablet (75 mg total) by mouth daily. 90 tablet 3   diclofenac (VOLTAREN) 75 MG EC tablet Take 1 tablet (75 mg total) by mouth 2 (two) times daily. (Patient taking differently: Take 75 mg by mouth as needed.) 180 tablet 2   famotidine (PEPCID) 40 MG tablet Take 40 mg by mouth daily.     gabapentin (NEURONTIN) 100 MG capsule Take 1 capsule (100 mg total) by mouth 3 (three) times daily. (Patient taking differently: Take 100 mg by mouth as needed.) 90 capsule 2   Multiple Vitamin (MULTIVITAMIN WITH MINERALS) TABS tablet Take 1 tablet by mouth daily.     Multiple Vitamins-Minerals (PRESERVISION AREDS 2) CAPS Take by mouth 2 (two) times daily.      Omega-3 Fatty Acids (FISH OIL) 1000 MG CAPS Take by mouth daily.     timolol (TIMOPTIC) 0.5 % ophthalmic  solution Place 1 drop into the left eye daily.      No current facility-administered medications on file prior to visit.    ALLERGIES: Allergies  Allergen Reactions   Sulfa Antibiotics Other (See Comments)    Childhood Allergy  05/17/18 patient denies    FAMILY HISTORY: Family History  Problem Relation Age of Onset   Heart disease Mother    Alzheimer's disease Mother    Heart disease Father    Lung disease Father    Healthy Son       Objective:   General: No acute distress.  Patient appears well groomed.   Head:  Normocephalic/atraumatic Eyes:  Fundi examined but not visualized Neck: supple, no paraspinal tenderness, full range of motion Heart:  Regular rate and rhythm Lungs:  Clear to auscultation bilaterally Back: No paraspinal tenderness Neurological Exam: alert and oriented to person, place, and time. Attention span and concentration intact, recent and remote memory intact, fund of knowledge intact.  Speech fluent and not dysarthric, language intact.  CN II-XII intact. Bulk and tone normal, muscle strength 5/5 throughout.  Sensation to light touch, temperature and vibration intact.  Deep tendon reflexes 2+ throughout, toes downgoing.  Finger to nose and heel to shin testing intact.  Gait is cautious and slightly broader than prior exam, mild en bloc turn, Rhomberg positive with minimal swaying, needs to hold to an object or a person when standing on upright position.   Sharene Butters, PA-C      Thank you for allowing [place] the opportunity to participate in the care of this nice patient. Please do not hesitate to contact us for any questions or concerns.   Total time spent on today's visit was 37 minutes dedicated to this patient today, preparing to see patient, examining the patient, ordering tests and/or medications and counseling the patient, documenting clinical information in the EHR or other health record, independently interpreting results and communicating results to  the patient/family, discussing treatment and goals, answering patient's questions and coordinating care.  Cc:  Bonnita Hollow, MD  Sharene Butters 11/07/2021 7:32 AM

## 2021-11-06 NOTE — Patient Instructions (Signed)
Repeat the MRI the brain to rule out new findings in the cerebellum such as a stroke Continue Plavix  If any symptoms of a stroke go to the ER immediately  Follow up in 3 month with Dr. Delice Lesch

## 2021-11-07 DIAGNOSIS — G912 (Idiopathic) normal pressure hydrocephalus: Secondary | ICD-10-CM | POA: Insufficient documentation

## 2021-11-07 DIAGNOSIS — R2689 Other abnormalities of gait and mobility: Secondary | ICD-10-CM | POA: Insufficient documentation

## 2021-11-15 DIAGNOSIS — R55 Syncope and collapse: Secondary | ICD-10-CM | POA: Diagnosis not present

## 2021-11-15 DIAGNOSIS — Z4509 Encounter for adjustment and management of other cardiac device: Secondary | ICD-10-CM | POA: Diagnosis not present

## 2021-11-15 DIAGNOSIS — Z95818 Presence of other cardiac implants and grafts: Secondary | ICD-10-CM | POA: Diagnosis not present

## 2021-11-19 ENCOUNTER — Ambulatory Visit (HOSPITAL_COMMUNITY)
Admission: RE | Admit: 2021-11-19 | Discharge: 2021-11-19 | Disposition: A | Discharge status: Home / Self Care | Payer: Medicare HMO | Source: Ambulatory Visit | Attending: Family Medicine | Admitting: Family Medicine

## 2021-11-19 DIAGNOSIS — Z780 Asymptomatic menopausal state: Secondary | ICD-10-CM | POA: Diagnosis not present

## 2021-11-19 DIAGNOSIS — M810 Age-related osteoporosis without current pathological fracture: Secondary | ICD-10-CM

## 2021-11-26 ENCOUNTER — Ambulatory Visit
Admission: RE | Admit: 2021-11-26 | Discharge: 2021-11-26 | Disposition: A | Discharge status: Home / Self Care | Payer: Medicare HMO | Source: Ambulatory Visit | Attending: Physician Assistant | Admitting: Physician Assistant

## 2021-11-26 DIAGNOSIS — G912 (Idiopathic) normal pressure hydrocephalus: Secondary | ICD-10-CM | POA: Diagnosis not present

## 2021-11-26 DIAGNOSIS — R9082 White matter disease, unspecified: Secondary | ICD-10-CM | POA: Diagnosis not present

## 2021-11-26 DIAGNOSIS — I639 Cerebral infarction, unspecified: Secondary | ICD-10-CM | POA: Diagnosis not present

## 2021-11-26 MED ORDER — GADOBENATE DIMEGLUMINE 529 MG/ML IV SOLN
14.0000 mL | Freq: Once | INTRAVENOUS | Status: AC | PRN
  Administered 2021-11-26: 14 mL via INTRAVENOUS

## 2021-12-16 DIAGNOSIS — Z4509 Encounter for adjustment and management of other cardiac device: Secondary | ICD-10-CM | POA: Diagnosis not present

## 2021-12-16 DIAGNOSIS — Z95818 Presence of other cardiac implants and grafts: Secondary | ICD-10-CM | POA: Diagnosis not present

## 2021-12-16 DIAGNOSIS — R55 Syncope and collapse: Secondary | ICD-10-CM | POA: Diagnosis not present

## 2021-12-23 ENCOUNTER — Ambulatory Visit: Payer: Medicare HMO

## 2021-12-23 DIAGNOSIS — I6523 Occlusion and stenosis of bilateral carotid arteries: Secondary | ICD-10-CM

## 2021-12-25 DIAGNOSIS — R609 Edema, unspecified: Secondary | ICD-10-CM | POA: Diagnosis not present

## 2021-12-25 DIAGNOSIS — F17210 Nicotine dependence, cigarettes, uncomplicated: Secondary | ICD-10-CM | POA: Diagnosis not present

## 2021-12-25 DIAGNOSIS — E7801 Familial hypercholesterolemia: Secondary | ICD-10-CM | POA: Diagnosis not present

## 2021-12-25 DIAGNOSIS — Z8673 Personal history of transient ischemic attack (TIA), and cerebral infarction without residual deficits: Secondary | ICD-10-CM | POA: Diagnosis not present

## 2021-12-25 DIAGNOSIS — R030 Elevated blood-pressure reading, without diagnosis of hypertension: Secondary | ICD-10-CM | POA: Diagnosis not present

## 2021-12-25 DIAGNOSIS — G912 (Idiopathic) normal pressure hydrocephalus: Secondary | ICD-10-CM | POA: Diagnosis not present

## 2021-12-25 DIAGNOSIS — Z6823 Body mass index (BMI) 23.0-23.9, adult: Secondary | ICD-10-CM | POA: Diagnosis not present

## 2022-01-01 DIAGNOSIS — E7849 Other hyperlipidemia: Secondary | ICD-10-CM | POA: Diagnosis not present

## 2022-01-01 DIAGNOSIS — R739 Hyperglycemia, unspecified: Secondary | ICD-10-CM | POA: Diagnosis not present

## 2022-01-01 DIAGNOSIS — Z13220 Encounter for screening for lipoid disorders: Secondary | ICD-10-CM | POA: Diagnosis not present

## 2022-01-01 DIAGNOSIS — K219 Gastro-esophageal reflux disease without esophagitis: Secondary | ICD-10-CM | POA: Diagnosis not present

## 2022-01-02 ENCOUNTER — Encounter: Payer: Self-pay | Admitting: Cardiology

## 2022-01-02 ENCOUNTER — Ambulatory Visit: Payer: Medicare HMO | Admitting: Cardiology

## 2022-01-02 VITALS — BP 144/62 | HR 102 | Temp 98.8°F | Resp 16 | Ht 68.0 in | Wt 149.0 lb

## 2022-01-02 DIAGNOSIS — I480 Paroxysmal atrial fibrillation: Secondary | ICD-10-CM | POA: Diagnosis not present

## 2022-01-02 DIAGNOSIS — Z95818 Presence of other cardiac implants and grafts: Secondary | ICD-10-CM | POA: Diagnosis not present

## 2022-01-02 DIAGNOSIS — E7800 Pure hypercholesterolemia, unspecified: Secondary | ICD-10-CM

## 2022-01-02 DIAGNOSIS — Z8673 Personal history of transient ischemic attack (TIA), and cerebral infarction without residual deficits: Secondary | ICD-10-CM

## 2022-01-02 DIAGNOSIS — I498 Other specified cardiac arrhythmias: Secondary | ICD-10-CM | POA: Diagnosis not present

## 2022-01-02 MED ORDER — APIXABAN 5 MG PO TABS: 5.0000 mg | ORAL_TABLET | Freq: Two times a day (BID) | ORAL | 6 refills | Status: DC

## 2022-01-02 NOTE — Progress Notes (Signed)
Primary Physician/Referring:  Bonnita Hollow, MD  Patient ID: [name], female    DOB: [birth year], 76 y.o.   MRN: [id number]  Chief Complaint  Patient presents with   Hypertension    With orthostatics   Hyperlipidemia   carotid disease   Follow-up    6 months    HPI:    [name]  is a 76 y.o. 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  She quit smoking on 05/10/2021 but unfortunately has started smoking again.  She underwent loop recorder implantation on 06/12/2021 for evaluation of syncope and also stroke.    She is feeling well overall at today's office visit.  Unfortunately she is still smoking.  She has no specific complaints today.  Continues to have mild chronic dyspnea.  She has not had any syncope or new neurologic deficits.  Past Medical History:  Diagnosis Date   Arthritis    Breast cancer ([place])    left breast/lumpectomy/chemo/rad 2003   COPD (chronic obstructive pulmonary disease) ([place])    DDD (degenerative disc disease), lumbar 08/31/2017   Diverticulitis    GERD (gastroesophageal reflux disease)    Hemoptysis    Hyperlipidemia    Iron deficiency    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 06/15/2021   Macular degeneration    bilateral   Personal history of radiation therapy 2003   Pulmonary embolism ([place])    Syncope and collapse 05/17/2021   Tachycardia    Past Surgical History:  Procedure Laterality Date   BACK SURGERY     BREAST LUMPECTOMY     COLONOSCOPY  01/28/2012   Procedure: COLONOSCOPY;  Surgeon: Rogene Houston, MD;   Location: [facility];  Service: Endoscopy;  Laterality: N/A;  1200   COLONOSCOPY  01-2012   ESOPHAGOGASTRODUODENOSCOPY N/A 07/01/2013   Procedure: ESOPHAGOGASTRODUODENOSCOPY (EGD);  Surgeon: Rogene Houston, MD;  Location: [facility];  Service: Endoscopy;  Laterality: N/A;  1:10   LAPAROSCOPIC TUBAL LIGATION  1970   TONSILLECTOMY     Patient was age 43   Family History  Problem Relation Age of Onset   Heart disease Mother    Alzheimer's disease Mother    Heart disease Father    Lung disease Father    Healthy Son     Social History   Tobacco Use   Smoking status: Some Days    Packs/day: 0.50    Years: 30.00    Total pack years: 15.00    Types: Cigarettes   Smokeless tobacco: Never   Tobacco comments:    05/17/18 1- 1.5 PPD  Substance Use Topics   Alcohol use: No    Alcohol/week: 0.0 standard drinks of alcohol   Marital Status: Married  ROS  Review of Systems  Cardiovascular:  Positive for dyspnea on exertion (chronic). Negative for chest pain and leg swelling.  Respiratory:  Negative for cough and sputum production.   Neurological:  Positive for dizziness (no recurrence) and loss of balance.   Objective  Blood pressure (!) 144/62, pulse (!) 102, temperature 98.8 F (37.1 C), temperature source Temporal, resp. rate 16, height  $'5\' 8"'y$  (1.727 m), weight 149 lb (67.6 kg), SpO2 98 %. Body mass index is 22.66 kg/m.     01/02/2022    1:06 PM 11/06/2021    2:40 PM 09/12/2021   12:45 PM  Vitals with BMI  Height '5\' 8"'$  '5\' 8"'$  '5\' 8"'$   Weight 149 lbs 151 lbs 146 lbs 13 oz  BMI 22.66 14.48 18.56  Systolic 314 970 263  Diastolic 62 65 70  Pulse 785 59 81    Orthostatic VS for the past 72 hrs (Last 3 readings):  Orthostatic BP Patient Position BP Location Cuff Size Orthostatic Pulse  01/02/22 1320 137/62 Standing Left Arm Normal 82  01/02/22 1319 125/59 Sitting Left Arm Normal 78  01/02/22 1318 137/60 Supine Left Arm Normal 70     Physical Exam Vitals reviewed.  Neck:      Vascular: No carotid bruit or JVD.  Cardiovascular:     Rate and Rhythm: Normal rate. Rhythm irregular.     Pulses: Intact distal pulses.     Heart sounds: Normal heart sounds. No murmur heard.    No gallop.  Pulmonary:     Effort: Pulmonary effort is normal.     Breath sounds: Rales (scattered) present.  Abdominal:     General: Bowel sounds are normal.     Palpations: Abdomen is soft.  Musculoskeletal:     Right lower leg: Edema (1-2+ ankle edema) present.     Left lower leg: Edema (1-2+ ankle edema) present.      Laboratory examination:   Recent Labs    03/27/21 1218  NA 140  K 3.7  CL 103  CO2 29  GLUCOSE 91  BUN 13  CREATININE 0.57  CALCIUM 9.1  GFRNONAA >60   Lab Results  Component Value Date   WBC 6.7 03/27/2021   HGB 12.5 03/27/2021   HCT 38.7 03/27/2021   MCV 94.2 03/27/2021   PLT 324 03/27/2021   Lab Results  Component Value Date   CHOL 186 11/01/2019   HDL 59 11/01/2019   LDLCALC 103 (H) 11/01/2019   TRIG 138 11/01/2019   CHOLHDL 3.2 11/01/2019    External labs:   Cholesterol, total 189.000 m 04/10/2020 HDL 63.000 mg 04/10/2020 LDL 105.000 M 04/10/2020 Triglycerides 119.000 m 04/10/2020  A1C 6.000 % 01/10/2020  Allergies   Allergies  Allergen Reactions   Sulfa Antibiotics Other (See Comments)    Childhood Allergy  05/17/18 patient denies    Medication list after today's encounter    Current Outpatient Medications:    apixaban (ELIQUIS) 5 MG TABS tablet, Take 1 tablet (5 mg total) by mouth 2 (two) times daily., Disp: 60 tablet, Rfl: 6   atorvastatin (LIPITOR) 20 MG tablet, Take 1 tablet (20 mg total) by mouth daily., Disp: 90 tablet, Rfl: 3   CALCIUM PO, Take 1 tablet by mouth daily., Disp: , Rfl:    Cholecalciferol (VITAMIN D PO), Take 1 tablet by mouth daily., Disp: , Rfl:    Multiple Vitamin (MULTIVITAMIN WITH MINERALS) TABS tablet, Take 1 tablet by mouth daily., Disp: , Rfl:    Multiple Vitamins-Minerals (PRESERVISION AREDS 2) CAPS,  Take by mouth 2 (two) times daily. , Disp: , Rfl:    Omega-3 Fatty Acids (FISH OIL) 1000 MG CAPS, Take by mouth daily., Disp: , Rfl:    timolol (TIMOPTIC) 0.5 % ophthalmic solution, Place 1 drop into the left eye daily. , Disp: , Rfl:    Radiology:   MRI 11/26/2021: 1. No acute intracranial  abnormality. 2. Stable supratentorial ventriculomegaly. 3. Mild-to-moderate chronic white matter disease, most likely related to chronic microangiopathy, unchanged. 4. Chronic small cerebellar infarcts.  Cardiac Studies:   PCV ECHOCARDIOGRAM COMPLETE 05/06/2021 Left ventricle cavity is normal in size and wall thickness. Normal global wall motion. Normal LV systolic function with visual EF 64%. Doppler evidence of grade I (impaired) diastolic dysfunction, normal LAP. Left atrial cavity is moderately dilated. Mild mitral valve leaflet thickening. No evidence of mitral stenosis. Mild (Grade I) mitral regurgitation. Normal right atrial pressure. Compared to previous study in 2019, moderate LA dilatation is new finding. No other significant change noted.    PCV MYOCARDIAL PERFUSION WITH LEXISCAN 05/06/2021 Nondiagnostic ECG stress. The heart rate response was consistent with Lexiscan.  The blood pressure response was physiologic. Breast attenuation artifact in the mid to distal anterior and septal region without ischemia.  Normal myocardial perfusion. Overall LV systolic function is normal without regional wall motion abnormalities. Stress LV EF: 64%. No previous exam available for comparison. Low risk.  Carotid artery duplex 12/23/2021: Duplex suggests stenosis in the right internal carotid artery (1-15%). 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 Antegrade right vertebral artery flow. Antegrade left vertebral artery flow. Mild homogeneous plaque in bilateral carotid arteries. Compared to the study done on 06/12/2021, no significant change. Follow  up in six months is appropriate if clinically indicated.  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   Remote loop recorder transmission 12/16/2021: Predominant rhythm is normal sinus rhythm.  There was no atrial fibrillation, there were brief episodes of atrial tachycardia/A. Fib, longest 2 minutes. AT/AF Burden 3.3%  There were no pauses, no symptoms reported.  EKG:   EKG 01/02/2022: Wandering atrial pacemaker, ventricular rate 84 bpm, incomplete right bundle branch block.  Normal QT interval.  EKG 05/17/2021: Ectopic atrial rhythm at the rate of 70 bpm, prominent R in V2 and V3, RVH.  Pulmonary disease pattern. No significant change from EKG 04/12/2021. Compared to 03/27/2021, ectopic atrial rhythm is new.    Assessment     ICD-10-CM   1. Paroxysmal atrial fibrillation (HCC)  I48.0 EKG 12-Lead    apixaban (ELIQUIS) 5 MG TABS tablet    2. 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  Z95.818     4. 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  New 05/01/2021 MRI right centrum semiovalle  Z86.73 apixaban (ELIQUIS) 5 MG TABS tablet    5. Pure hypercholesterolemia  E78.00 Lipid Panel With LDL/HDL Ratio      CHA2DS2-VASc Score is 5.  Yearly risk of stroke: 7.2% (A, F, CVA).  Score of 1=0.6; 2=2.2; 3=3.2; 4=4.8; 5=7.2; 6=9.8; 7=>9.8) -(CHF; HTN; vasc disease DM,  Female = 1; Age <65 =0; 65-74 = 1,  >75 =2; stroke/embolism= 2).   Medications Discontinued During This Encounter  Medication Reason   diclofenac (VOLTAREN) 75 MG EC tablet    famotidine (PEPCID) 40 MG tablet    gabapentin (NEURONTIN) 100 MG capsule    clopidogrel (PLAVIX) 75 MG tablet Change in therapy     Meds ordered this encounter  Medications   apixaban (ELIQUIS) 5 MG TABS tablet    Sig: Take 1 tablet (5 mg total) by mouth 2 (two) times daily.    Dispense:  60 tablet    Refill:  6    Orders Placed This Encounter  Procedures   Lipid  Panel With LDL/HDL Ratio  EKG 12-Lead   Recommendations:   LYNDY RUSSMAN is a 76 y.o. 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  She quit smoking on 05/10/2021 but unfortunately has started smoking again.  She underwent loop recorder implantation on 06/12/2021 for evaluation of syncope and also stroke.    Last office visit I recommended that she obtain lipid profile testing after and increase the dose of Lipitor to 20 mg in view of uncontrolled lipids.  We will place orders again.  I had started her on Plavix instead of aspirin for recurrent stroke however after review of recent loop recorder tracing, she has had few episodes of brief atrial fibrillation and overall atrial fibrillation burden of <3 to 5% but although brief, extremely difficult to make out whether it is atrial fibrillation or multifocal atrial tachycardia or atrial tachycardia.  Extensive review of her prior strokes, MRI imaging results reveal most probably embolic stroke and not necessarily thrombotic stroke.  In view of this I had a long discussion with the patient regarding starting her on Eliquis, I discussed the risk of bleeding with regard to Eliquis versus Plavix or aspirin, trials suggest superiority of Eliquis in both safety and efficacy.  I also reviewed the carotid artery duplex which reveals stable moderate carotid disease on the left and mild disease on the right.  She will need continued surveillance in 6 months.  After discussions, patient is willing to make the change, will start her on Eliquis 5 mg  p.o. twice daily.  Blood pressure is well controlled, continue present medications.  In view of these changes that I made, I would like to see her back in 3 months for follow-up.   Adrian Prows, PA-C 01/02/2022, 6:37 PM Office: [phone]

## 2022-01-09 ENCOUNTER — Ambulatory Visit: Payer: Medicare HMO | Admitting: Physician Assistant

## 2022-01-13 ENCOUNTER — Other Ambulatory Visit: Payer: Self-pay

## 2022-01-13 DIAGNOSIS — I480 Paroxysmal atrial fibrillation: Secondary | ICD-10-CM

## 2022-01-13 DIAGNOSIS — Z8673 Personal history of transient ischemic attack (TIA), and cerebral infarction without residual deficits: Secondary | ICD-10-CM

## 2022-01-13 MED ORDER — APIXABAN 5 MG PO TABS: 5.0000 mg | ORAL_TABLET | Freq: Two times a day (BID) | ORAL | 0 refills | Status: DC

## 2022-01-16 DIAGNOSIS — R55 Syncope and collapse: Secondary | ICD-10-CM | POA: Diagnosis not present

## 2022-01-16 DIAGNOSIS — Z95818 Presence of other cardiac implants and grafts: Secondary | ICD-10-CM | POA: Diagnosis not present

## 2022-01-16 DIAGNOSIS — Z4509 Encounter for adjustment and management of other cardiac device: Secondary | ICD-10-CM | POA: Diagnosis not present

## 2022-01-23 DIAGNOSIS — R609 Edema, unspecified: Secondary | ICD-10-CM | POA: Diagnosis not present

## 2022-01-23 DIAGNOSIS — F17210 Nicotine dependence, cigarettes, uncomplicated: Secondary | ICD-10-CM | POA: Diagnosis not present

## 2022-01-23 DIAGNOSIS — E7801 Familial hypercholesterolemia: Secondary | ICD-10-CM | POA: Diagnosis not present

## 2022-01-23 DIAGNOSIS — W19XXXA Unspecified fall, initial encounter: Secondary | ICD-10-CM | POA: Diagnosis not present

## 2022-01-23 DIAGNOSIS — Z6823 Body mass index (BMI) 23.0-23.9, adult: Secondary | ICD-10-CM | POA: Diagnosis not present

## 2022-01-23 DIAGNOSIS — Z8673 Personal history of transient ischemic attack (TIA), and cerebral infarction without residual deficits: Secondary | ICD-10-CM | POA: Diagnosis not present

## 2022-01-23 DIAGNOSIS — R030 Elevated blood-pressure reading, without diagnosis of hypertension: Secondary | ICD-10-CM | POA: Diagnosis not present

## 2022-01-23 DIAGNOSIS — G912 (Idiopathic) normal pressure hydrocephalus: Secondary | ICD-10-CM | POA: Diagnosis not present

## 2022-01-24 ENCOUNTER — Encounter (INDEPENDENT_AMBULATORY_CARE_PROVIDER_SITE_OTHER): Payer: Self-pay | Admitting: *Deleted

## 2022-02-16 DIAGNOSIS — Z95810 Presence of automatic (implantable) cardiac defibrillator: Secondary | ICD-10-CM | POA: Diagnosis not present

## 2022-02-16 DIAGNOSIS — R55 Syncope and collapse: Secondary | ICD-10-CM | POA: Diagnosis not present

## 2022-02-16 DIAGNOSIS — Z4509 Encounter for adjustment and management of other cardiac device: Secondary | ICD-10-CM | POA: Diagnosis not present

## 2022-02-25 ENCOUNTER — Encounter: Payer: Self-pay | Admitting: Neurology

## 2022-02-25 ENCOUNTER — Ambulatory Visit (INDEPENDENT_AMBULATORY_CARE_PROVIDER_SITE_OTHER): Payer: Medicare HMO | Admitting: Neurology

## 2022-02-25 VITALS — BP 132/57 | HR 79 | Resp 18 | Ht 69.0 in | Wt 150.0 lb

## 2022-02-25 DIAGNOSIS — G629 Polyneuropathy, unspecified: Secondary | ICD-10-CM

## 2022-02-25 DIAGNOSIS — R2681 Unsteadiness on feet: Secondary | ICD-10-CM | POA: Diagnosis not present

## 2022-02-25 DIAGNOSIS — H35372 Puckering of macula, left eye: Secondary | ICD-10-CM | POA: Diagnosis not present

## 2022-02-25 DIAGNOSIS — H353232 Exudative age-related macular degeneration, bilateral, with inactive choroidal neovascularization: Secondary | ICD-10-CM | POA: Diagnosis not present

## 2022-02-25 DIAGNOSIS — H3581 Retinal edema: Secondary | ICD-10-CM | POA: Diagnosis not present

## 2022-02-25 DIAGNOSIS — G912 (Idiopathic) normal pressure hydrocephalus: Secondary | ICD-10-CM

## 2022-02-25 NOTE — Progress Notes (Signed)
NEUROLOGY FOLLOW UP OFFICE NOTE  [name] [id number] 12-15-1945  HISTORY OF PRESENT ILLNESS: I had the pleasure of seeing [name] in follow-up in the neurology clinic on 02/28/2022.  The patient was last seen by Memory Disorders PA Sharene Butters 3 months ago for gait instability and syncope. She is again accompanied by her husband who helps supplement the history today.  Records and images were personally reviewed where available. I had seen her initially in 05/2021 for syncope and ordered a brain MRI as part of workup. I personally reviewed MRI brain with and without contrast done 05/02/21 which showed a punctate area of abnormal diffusion in the right centrum semiovale superimposed on patchy bilateral white matter T2 and FLAIR hyperintensity. There were small chronic infarcts in the bilateral cerebellum. Chronic lateral and 3rd ventriculomegaly again noted, no convincing transependymal edema. Suggestion of hyperdynamic flow at the cerebral aqueduct with stable and small/normal 4th ventricle size. Her 1-hour EEG in 04/2021 showed occasional focal slowing over the left temporal region, no epileptiform discharges seen. On her last visit with Ms. Wertman, she reported continued gait instability, increased incontinence. Repeat brain MRI was done 11/26/21 which I personally reviewed, no acute changes, stable ventriculomegaly, mild to moderate chronic microvascular disease.  She and her husband deny any syncopal episodes since 03/2021. She has a loop recorder. No staring/unresponsive episodes, gaps in time. She continues to note gait difficulties and uses a walker at home. She has a cane today and states she uses it outside but it is dangerous for her. She denies any falls in a long time. She has numbness and tingling to both mid-calf regions. No neck or back pain. She has urinary incontinence and has bowel issues related to her diet. She denies any headaches. She may get dizzy when getting up quickly.  She recalls Lasix prescribed for pedal edema caused dizziness and worsening balance so she stopped it. She has bilateral knee pain. She denies any tremors, dysphagia, anosmia. Memory is "probably normal for 76," she states it is not real good but she denies missing medications. She does not drive.    History on Initial Assessment 04/02/2021: This is a 76 year old right-handed woman with a history of pulmonary embolism, breast cancer, TIA, COPD, presenting for evaluation of syncope last 03/27/2021. She was in her usual state of health and had driven home from the bank, she recalls turning into her driveway, then waking up under her car. She denies any warning symptoms, she woke up on her stomach with half her body under the car. She turned and saw the underside of the car, taking 10-15 minutes to figure out how she would get out. She reports the car was not running, her door was open with her pocketbook, cane, and glasses strewn around. She denied any focal weakness, no tongue bite or incontinence. Her neighbor saw her sitting on the ground and helped her up. She had some neck pain in the ER.  CBC, BMP normal. She had a CT head without contrast which I personally reviewed, no acute changes, ventricle enlarged, out of proportion to degree of parenchymal volume loss, no change from prior imaging in 12/2020. CTA head and neck showed mild atherosclerotic disease in both carotid bulbs, moderate focal stenosis in left M1. EKG showed NSR. She denies any prior episodes of loss of consciousness. She has had frequent falls. She had a bad fall in August where she hit her head and her right arm with "blood everywhere," she  remembers everything from that fall. There have been 2 more falls in between August and most recent one, both occurring when she gets up from a chair. She has been evaluated by 2 neurologists and neurosurgeons [facility] and [facility]) for normal pressure hydrocephalus, she was admitted at  [facility] in 09/2019 for lumbar drain trial, 3-day trial was unsuccessful and she did not meet criteria for shunt. They note her balance has been bad or "maybe worse." She has to hold on before she starts moving with her cane. Her back and knee pain also make it hard to stand up. She reported dizziness at [facility], felt due to orthostatic hypotension. She thinks she is usually hydrated, her husband disagrees and states she mostly drinks green tea. She reports losing her train of thought midsentence, "my head feels crazy." Her husband denies any staring spells, but she feels there are times "I'm there but I'm not there." She has urinary incontinence and wears adult diapers. No family history of seizures.   She has had TIA in the past and is presently on antihyperlipidemic therapy and also antiplatelet therapy, continue the same for now.  She is presently actively smoking.  She does not appear to be interested in smoking cessation. ( Dr. Einar Gip)      PAST MEDICAL HISTORY: Past Medical History:  Diagnosis Date   Arthritis    Breast cancer ([place])    left breast/lumpectomy/chemo/rad 2003   COPD (chronic obstructive pulmonary disease) ([place])    DDD (degenerative disc disease), lumbar 08/31/2017   Diverticulitis    GERD (gastroesophageal reflux disease)    Hemoptysis    Hyperlipidemia    Iron deficiency    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 06/15/2021   Macular degeneration    bilateral   Personal history of radiation therapy 2003   Pulmonary embolism ([place])    Syncope and collapse 05/17/2021   Tachycardia     MEDICATIONS: Current Outpatient Medications on File Prior to Visit  Medication Sig Dispense Refill   apixaban (ELIQUIS) 5 MG TABS tablet Take 1 tablet (5 mg total) by mouth 2 (two) times daily. 180 tablet 0   atorvastatin (LIPITOR) 20 MG tablet Take 1 tablet (20 mg total) by mouth daily. 90 tablet 3   CALCIUM PO Take 1 tablet by mouth daily.     Cholecalciferol (VITAMIN D PO) Take 1  tablet by mouth daily.     Multiple Vitamin (MULTIVITAMIN WITH MINERALS) TABS tablet Take 1 tablet by mouth daily.     Multiple Vitamins-Minerals (PRESERVISION AREDS 2) CAPS Take by mouth 2 (two) times daily.      Omega-3 Fatty Acids (FISH OIL) 1000 MG CAPS Take by mouth daily.     timolol (TIMOPTIC) 0.5 % ophthalmic solution Place 1 drop into the left eye daily.      No current facility-administered medications on file prior to visit.    ALLERGIES: Allergies  Allergen Reactions   Sulfa Antibiotics Other (See Comments)    Childhood Allergy  05/17/18 patient denies    FAMILY HISTORY: Family History  Problem Relation Age of Onset   Heart disease Mother    Alzheimer's disease Mother    Heart disease Father    Lung disease Father    Healthy Son     SOCIAL HISTORY: Social History   Socioeconomic History   Marital status: Married    Spouse name: Simona Huh   Number of children: 1   Years of education: some college   Highest education level:  Not on file  Occupational History   Not on file  Tobacco Use   Smoking status: Some Days    Packs/day: 0.50    Years: 30.00    Total pack years: 15.00    Types: Cigarettes   Smokeless tobacco: Never   Tobacco comments:    05/17/18 1- 1.5 PPD  Vaping Use   Vaping Use: Never used  Substance and Sexual Activity   Alcohol use: No    Alcohol/week: 0.0 standard drinks of alcohol   Drug use: No   Sexual activity: Not Currently  Other Topics Concern   Not on file  Social History Narrative   Lives with husband   Caffeine- none   Right handed    Social Determinants of Health   Financial Resource Strain: Not on file  Food Insecurity: Not on file  Transportation Needs: Not on file  Physical Activity: Not on file  Stress: Not on file  Social Connections: Not on file  Intimate Partner Violence: Not on file     PHYSICAL EXAM: Vitals:   02/25/22 1518  BP: (!) 132/57  Pulse: 79  Resp: 18  SpO2: 98%   General: No acute  distress Head:  Normocephalic/atraumatic Skin/Extremities: No rash, +bipedal 1+ edema and hyperpigmentation Neurological Exam: alert and awake. No aphasia or dysarthria. Fund of knowledge is appropriate.  Attention and concentration are normal.   Cranial nerves: Pupils equal, round. Extraocular movements intact with no nystagmus. Visual fields full.  No facial asymmetry.  Motor: Bulk and tone normal, no cogwheeling, muscle strength 5/5 throughout with no pronator drift. Sensation intact to al modalities. Decreased cold to bilateral knees. Decreased pin to left calf, increased pin sensation on right foot>calf. Decreased vibration sense to knees bilaterally. Reflexes +2 throughout except for absent ankle jerks bilaterally. Finger to nose testing intact.  Gait slow and cautious with magnetic gait, using cane. +Romberg test.    Declined repeat LP, had LP before and 4 days at [facility]   IMPRESSION: This is a very pleasant 76 yo RH woman with a history of PE, breast cancer, TIA, COPD, who presented for syncope. Etiology unclear, MRI brain showed a punctate subcortical infarct likely due to small vessel disease. EEG showed occasional left temporal slowing, no epileptiform discharges. She denies any further syncopal episodes since 03/2021. She has a loop recorder and now takes Eliquis for atrial fibrillation. She continues to report worsening gait/balance, as well as urinary incontinence. 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 She recalls this was done locally and investigated for 4 days at [facility], told she was not a shunt candidate at that time. She declines repeat testing and declines physical/balance therapy for now. We discussed balance issues are likely multifactorial, she does have neuropathy as well, in addition to knee pain and NPH. Neuropathy labs will be ordered if not done at PCP office. She was advised to use her walker at all times.  Follow-up in 6 months, call for any changes.    Thank you for allowing me to participate in her care.  Please do not hesitate to call for any questions or concerns.    Ellouise Newer, M.D.   CC: Dr. Grandville Silos, Dr. Jimmye Norman

## 2022-02-25 NOTE — Patient Instructions (Signed)
Good to see you.  Bloodwork from PCP will be requested for review. We will let you know if we need additional labs  2. Let me know if you change your mind about the lumbar puncture or physical/balance therapy  3. Recommend using walker regularly  4. Follow-up in 6 months, call for any changes

## 2022-02-28 ENCOUNTER — Telehealth: Payer: Self-pay

## 2022-02-28 ENCOUNTER — Other Ambulatory Visit: Payer: Self-pay

## 2022-02-28 DIAGNOSIS — G629 Polyneuropathy, unspecified: Secondary | ICD-10-CM

## 2022-02-28 DIAGNOSIS — R55 Syncope and collapse: Secondary | ICD-10-CM

## 2022-02-28 NOTE — Telephone Encounter (Signed)
Pt called an informed that we need a few more labs and that orders have been placed

## 2022-02-28 NOTE — Telephone Encounter (Signed)
-----  Message from Cameron Sprang, MD sent at 02/28/2022  2:20 PM EDT ----- Regarding: additional labs Pls let her know we received labwork done at PCP, I would like to add a few more:  Pls order B12, B1, ESR, folate  Thanks! Santiago Glad

## 2022-03-19 DIAGNOSIS — R55 Syncope and collapse: Secondary | ICD-10-CM | POA: Diagnosis not present

## 2022-03-19 DIAGNOSIS — Z4509 Encounter for adjustment and management of other cardiac device: Secondary | ICD-10-CM | POA: Diagnosis not present

## 2022-03-19 DIAGNOSIS — Z95818 Presence of other cardiac implants and grafts: Secondary | ICD-10-CM | POA: Diagnosis not present

## 2022-04-04 ENCOUNTER — Ambulatory Visit: Payer: Medicare HMO | Admitting: Cardiology

## 2022-04-04 ENCOUNTER — Encounter: Payer: Self-pay | Admitting: Cardiology

## 2022-04-04 VITALS — BP 136/71 | HR 81 | Temp 97.3°F | Resp 16 | Ht 69.0 in | Wt 154.6 lb

## 2022-04-04 DIAGNOSIS — I872 Venous insufficiency (chronic) (peripheral): Secondary | ICD-10-CM | POA: Diagnosis not present

## 2022-04-04 DIAGNOSIS — Z8673 Personal history of transient ischemic attack (TIA), and cerebral infarction without residual deficits: Secondary | ICD-10-CM | POA: Diagnosis not present

## 2022-04-04 DIAGNOSIS — I6523 Occlusion and stenosis of bilateral carotid arteries: Secondary | ICD-10-CM | POA: Diagnosis not present

## 2022-04-04 DIAGNOSIS — I480 Paroxysmal atrial fibrillation: Secondary | ICD-10-CM | POA: Diagnosis not present

## 2022-04-04 DIAGNOSIS — Z95818 Presence of other cardiac implants and grafts: Secondary | ICD-10-CM | POA: Diagnosis not present

## 2022-04-04 NOTE — Progress Notes (Unsigned)
Primary Physician/Referring:  Geraldine Nation, MD  Patient ID: [name], female    DOB: 03-15-1946, 76 y.o.   MRN: [id number]  Chief Complaint  Patient presents with   Paroxysmal atrial fibrillation   HPI:    [name]  is a 76 y.o. Caucasian female patient with remote pulmonary embolism, history of breast cancer SP lumpectomy followed by chemo and radiation therapy in 2003, history of TIA and cerebellar embolic stroke in 9924 and new stroke by MRI in 05/2021, normal pressure hydrocephalus on conservative therapy, chronic back pain and spina bifida and gait instability, COPD with ongoing tobacco use disorder hyperlipidemia was seen in the emergency room on 03/27/2021 with an episode of syncope.  She quit smoking on 05/10/2021 but unfortunately has started smoking again.  She underwent loop recorder implantation on 06/12/2021 for evaluation of syncope and also stroke.    She is feeling well overall at today's office visit.  Unfortunately she is still smoking.  She has no specific complaints today.  Continues to have mild chronic dyspnea.  She has not had any syncope or new neurologic deficits.  Past Medical History:  Diagnosis Date   Arthritis    Breast cancer ([place])    left breast/lumpectomy/chemo/rad 2003   COPD (chronic obstructive pulmonary disease) ([place])    DDD (degenerative disc disease), lumbar 08/31/2017   Diverticulitis    GERD (gastroesophageal reflux disease)    Hemoptysis    Hyperlipidemia    Iron deficiency    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 06/15/2021   Macular degeneration    bilateral   Personal history of radiation therapy 2003   Pulmonary embolism ([place])    Syncope and collapse 05/17/2021   Tachycardia    Past Surgical History:  Procedure Laterality Date   BACK SURGERY     BREAST LUMPECTOMY     COLONOSCOPY  01/28/2012   Procedure: COLONOSCOPY;  Surgeon: Rogene Houston, MD;  Location: [facility];  Service: Endoscopy;  Laterality: N/A;  1200    COLONOSCOPY  01-2012   ESOPHAGOGASTRODUODENOSCOPY N/A 07/01/2013   Procedure: ESOPHAGOGASTRODUODENOSCOPY (EGD);  Surgeon: Rogene Houston, MD;  Location: [facility];  Service: Endoscopy;  Laterality: N/A;  1:10   LAPAROSCOPIC TUBAL LIGATION  1970   TONSILLECTOMY     Patient was age 76   Family History  Problem Relation Age of Onset   Heart disease Mother    Alzheimer's disease Mother    Heart disease Father    Lung disease Father    Healthy Son     Social History   Tobacco Use   Smoking status: Some Days    Packs/day: 0.50    Years: 30.00    Total pack years: 15.00    Types: Cigarettes   Smokeless tobacco: Never   Tobacco comments:    05/17/18 1- 1.5 PPD  Substance Use Topics   Alcohol use: No    Alcohol/week: 0.0 standard drinks of alcohol   Marital Status: Married  ROS  Review of Systems  Cardiovascular:  Positive for dyspnea on exertion (chronic). Negative for chest pain and leg swelling.  Neurological:  Positive for dizziness (no recurrence) and loss of balance.   Objective  Blood pressure 136/71, pulse 81, temperature (!) 97.3 F (36.3 C), temperature source Temporal, resp. rate 16, height '5\' 9"'$  (1.753 m), weight 154 lb 9.6 oz (70.1 kg), SpO2 93 %. Body mass index is 22.83 kg/m.     04/04/2022   12:56 PM 02/25/2022  3:18 PM 01/02/2022    1:06 PM  Vitals with BMI  Height '5\' 9"'$  '5\' 9"'$  '5\' 8"'$   Weight 154 lbs 10 oz 150 lbs 149 lbs  BMI 22.82 26.83 41.96  Systolic 222 979 892  Diastolic 71 57 62  Pulse 81 79 102    Orthostatic VS for the past 72 hrs (Last 3 readings):  Orthostatic BP Patient Position BP Location Cuff Size Orthostatic Pulse  04/04/22 1314 124/68 Standing Left Arm Normal 69  04/04/22 1313 130/65 Sitting Left Arm Normal 85  04/04/22 1312 134/69 Supine Left Arm Normal 80    Physical Exam Vitals reviewed.  Neck:     Vascular: Carotid bruit (bilateral) present. No JVD.  Cardiovascular:     Rate and Rhythm: Normal rate. Rhythm irregular.      Pulses: Intact distal pulses.     Heart sounds: Normal heart sounds. No murmur heard.    No gallop.  Pulmonary:     Effort: Pulmonary effort is normal.     Breath sounds: Rales (scattered) present.  Abdominal:     General: Bowel sounds are normal.     Palpations: Abdomen is soft.  Musculoskeletal:     Right lower leg: Edema (1-2+ ankle edema) present.     Left lower leg: Edema (1-2+ ankle edema) present.     Laboratory examination:   Lab Results  Component Value Date   NA 140 03/27/2021   K 3.7 03/27/2021   CO2 29 03/27/2021   GLUCOSE 91 03/27/2021   BUN 13 03/27/2021   CREATININE 0.57 03/27/2021   CALCIUM 9.1 03/27/2021   GFRNONAA >60 03/27/2021    Lab Results  Component Value Date   WBC 6.7 03/27/2021   HGB 12.5 03/27/2021   HCT 38.7 03/27/2021   MCV 94.2 03/27/2021   PLT 324 03/27/2021   External labs:   Hemoglobin 13.600 g/d 01/01/2022 Platelets 369.000 10 01/01/2022  TSH 1.823 01/01/2022 Allergies   Allergies  Allergen Reactions   Sulfa Antibiotics Other (See Comments)    Childhood Allergy  05/17/18 patient denies    Medication list after today's encounter    Current Outpatient Medications:    apixaban (ELIQUIS) 5 MG TABS tablet, Take 1 tablet (5 mg total) by mouth 2 (two) times daily., Disp: 180 tablet, Rfl: 0   atorvastatin (LIPITOR) 20 MG tablet, Take 1 tablet (20 mg total) by mouth daily., Disp: 90 tablet, Rfl: 3   CALCIUM PO, Take 1 tablet by mouth daily., Disp: , Rfl:    Cholecalciferol (VITAMIN D PO), Take 1 tablet by mouth daily., Disp: , Rfl:    Multiple Vitamin (MULTIVITAMIN WITH MINERALS) TABS tablet, Take 1 tablet by mouth daily., Disp: , Rfl:    Multiple Vitamins-Minerals (PRESERVISION AREDS 2) CAPS, Take by mouth 2 (two) times daily. , Disp: , Rfl:    Omega-3 Fatty Acids (FISH OIL) 1000 MG CAPS, Take by mouth daily., Disp: , Rfl:    Radiology:   MRI 11/26/2021: 1. No acute intracranial abnormality. 2. Stable supratentorial  ventriculomegaly. 3. Mild-to-moderate chronic white matter disease, most likely related to chronic microangiopathy, unchanged. 4. Chronic small cerebellar infarcts.  Cardiac Studies:   PCV ECHOCARDIOGRAM COMPLETE 05/06/2021 Left ventricle cavity is normal in size and wall thickness. Normal global wall motion. Normal LV systolic function with visual EF 64%. Doppler evidence of grade I (impaired) diastolic dysfunction, normal LAP. Left atrial cavity is moderately dilated. Mild mitral valve leaflet thickening. No evidence of mitral stenosis. Mild (Grade I) mitral regurgitation. Normal  right atrial pressure. Compared to previous study in 2019, moderate LA dilatation is new finding. No other significant change noted.    PCV MYOCARDIAL PERFUSION WITH LEXISCAN 05/06/2021 Nondiagnostic ECG stress. The heart rate response was consistent with Lexiscan.  The blood pressure response was physiologic. Breast attenuation artifact in the mid to distal anterior and septal region without ischemia.  Normal myocardial perfusion. Overall LV systolic function is normal without regional wall motion abnormalities. Stress LV EF: 64%. No previous exam available for comparison. Low risk.  Carotid artery duplex 12/23/2021: Duplex suggests stenosis in the right internal carotid artery (1-15%). 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 Antegrade right vertebral artery flow. Antegrade left vertebral artery flow. Mild homogeneous plaque in bilateral carotid arteries. Compared to the study done on 06/12/2021, no significant change. Follow up in six months is appropriate if clinically indicated.  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   Remote loop recorder transmission 03/19/2022: Predominant rhythm is normal sinus rhythm.  Paroxysmal episodes of atrial fibrillation, AT/AF burden 10.3%.  Episode frequency of AF has reduced since Beginning of Sept 2023.  EKG:    EKG 04/04/2022: Normal sinus rhythm at the rate of 80 bpm, left axis deviation scratch that leftward axis.  Incomplete right bundle branch block.  Frequent multifocal PACs (4).  Compared to 01/02/2022, no significant change.  Assessment     ICD-10-CM   1. Paroxysmal atrial fibrillation (HCC)  I48.0 EKG 12-Lead    2. 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  Z95.818     3. 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  New 05/01/2021 MRI right centrum semiovalle  Z86.73     4. Asymptomatic bilateral carotid artery stenosis  I65.23 PCV CAROTID DUPLEX (BILATERAL)    5. Edema of both lower extremities due to peripheral venous insufficiency  I87.2       CHA2DS2-VASc Score is 5.  Yearly risk of stroke: 7.2% (A, F, CVA).  Score of 1=0.6; 2=2.2; 3=3.2; 4=4.8; 5=7.2; 6=9.8; 7=>9.8) -(CHF; HTN; vasc disease DM,  Female = 1; Age <65 =0; 65-74 = 1,  >75 =2; stroke/embolism= 2).   Medications Discontinued During This Encounter  Medication Reason   timolol (TIMOPTIC) 0.5 % ophthalmic solution     No orders of the defined types were placed in this encounter.  Orders Placed This Encounter  Procedures   EKG 12-Lead   Recommendations:   [name] is a 76 y.o. Caucasian female patient with remote pulmonary embolism, history of breast cancer SP lumpectomy followed by chemo and radiation therapy in 2003, history of TIA and cerebellar embolic stroke in 7096 and new stroke by MRI in 05/2021, normal pressure hydrocephalus on conservative therapy, chronic back pain and spina bifida and gait instability, COPD with ongoing tobacco use disorder hyperlipidemia was seen in the emergency room on 03/27/2021 with an episode of syncope.  She quit smoking on 05/10/2021 but unfortunately has started smoking again.  She underwent loop recorder implantation on 06/12/2021 for evaluation of syncope and also stroke.    Last office visit I recommended that she obtain lipid profile  testing after and increase the dose of Lipitor to 20 mg in view of uncontrolled lipids.  We will place orders again.  I had started her on Plavix instead of aspirin for recurrent stroke however after review of recent loop recorder tracing, she has had few episodes of brief atrial fibrillation and overall atrial fibrillation burden of <3 to 5%  but although brief, extremely difficult to make out whether it is atrial fibrillation or multifocal atrial tachycardia or atrial tachycardia.  Extensive review of her prior strokes, MRI imaging results reveal most probably embolic stroke and not necessarily thrombotic stroke.  In view of this I had a long discussion with the patient regarding starting her on Eliquis, I discussed the risk of bleeding with regard to Eliquis versus Plavix or aspirin, trials suggest superiority of Eliquis in both safety and efficacy.  I also reviewed the carotid artery duplex which reveals stable moderate carotid disease on the left and mild disease on the right.  She will need continued surveillance in 6 months.  After discussions, patient is willing to make the change, will start her on Eliquis 5 mg p.o. twice daily.  Blood pressure is well controlled, continue present medications.  In view of these changes that I made, I would like to see her back in 3 months for follow-up.  1. Paroxysmal atrial fibrillation (HCC) ***  2. 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 ***  3. 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  New 05/01/2021 MRI right centrum semiovalle ***  4. Asymptomatic bilateral carotid artery stenosis ***  5. Edema of both lower extremities due to peripheral venous insufficiency ***    Adrian Prows, PA-C 04/04/2022, 1:42 PM Office: [phone]

## 2022-04-16 ENCOUNTER — Other Ambulatory Visit: Payer: Self-pay

## 2022-04-16 DIAGNOSIS — Z8673 Personal history of transient ischemic attack (TIA), and cerebral infarction without residual deficits: Secondary | ICD-10-CM

## 2022-04-16 DIAGNOSIS — I480 Paroxysmal atrial fibrillation: Secondary | ICD-10-CM

## 2022-04-16 MED ORDER — APIXABAN 5 MG PO TABS: 5.0000 mg | ORAL_TABLET | Freq: Two times a day (BID) | ORAL | 1 refills | Status: DC

## 2022-04-19 DIAGNOSIS — Z4509 Encounter for adjustment and management of other cardiac device: Secondary | ICD-10-CM | POA: Diagnosis not present

## 2022-04-19 DIAGNOSIS — R55 Syncope and collapse: Secondary | ICD-10-CM | POA: Diagnosis not present

## 2022-04-19 DIAGNOSIS — Z95818 Presence of other cardiac implants and grafts: Secondary | ICD-10-CM | POA: Diagnosis not present

## 2022-04-21 DIAGNOSIS — R030 Elevated blood-pressure reading, without diagnosis of hypertension: Secondary | ICD-10-CM | POA: Diagnosis not present

## 2022-04-21 DIAGNOSIS — G912 (Idiopathic) normal pressure hydrocephalus: Secondary | ICD-10-CM | POA: Diagnosis not present

## 2022-04-21 DIAGNOSIS — E7801 Familial hypercholesterolemia: Secondary | ICD-10-CM | POA: Diagnosis not present

## 2022-04-21 DIAGNOSIS — Z6822 Body mass index (BMI) 22.0-22.9, adult: Secondary | ICD-10-CM | POA: Diagnosis not present

## 2022-04-21 DIAGNOSIS — W19XXXA Unspecified fall, initial encounter: Secondary | ICD-10-CM | POA: Diagnosis not present

## 2022-04-21 DIAGNOSIS — R609 Edema, unspecified: Secondary | ICD-10-CM | POA: Diagnosis not present

## 2022-04-21 DIAGNOSIS — Z8673 Personal history of transient ischemic attack (TIA), and cerebral infarction without residual deficits: Secondary | ICD-10-CM | POA: Diagnosis not present

## 2022-04-21 DIAGNOSIS — F17210 Nicotine dependence, cigarettes, uncomplicated: Secondary | ICD-10-CM | POA: Diagnosis not present

## 2022-04-21 DIAGNOSIS — M810 Age-related osteoporosis without current pathological fracture: Secondary | ICD-10-CM | POA: Diagnosis not present

## 2022-04-22 ENCOUNTER — Other Ambulatory Visit: Payer: Self-pay

## 2022-04-22 ENCOUNTER — Other Ambulatory Visit (HOSPITAL_COMMUNITY): Payer: Self-pay | Admitting: Internal Medicine

## 2022-04-22 ENCOUNTER — Emergency Department (HOSPITAL_COMMUNITY): Payer: Medicare HMO

## 2022-04-22 ENCOUNTER — Encounter (HOSPITAL_COMMUNITY): Payer: Self-pay

## 2022-04-22 ENCOUNTER — Inpatient Hospital Stay (HOSPITAL_COMMUNITY)
Admission: EM | Admit: 2022-04-22 | Discharge: 2022-04-29 | DRG: 522 | Disposition: A | Discharge status: Skilled Nursing Facility | Payer: Medicare HMO | Attending: Internal Medicine | Admitting: Internal Medicine

## 2022-04-22 DIAGNOSIS — I480 Paroxysmal atrial fibrillation: Secondary | ICD-10-CM | POA: Diagnosis present

## 2022-04-22 DIAGNOSIS — Z8249 Family history of ischemic heart disease and other diseases of the circulatory system: Secondary | ICD-10-CM

## 2022-04-22 DIAGNOSIS — Z7901 Long term (current) use of anticoagulants: Secondary | ICD-10-CM

## 2022-04-22 DIAGNOSIS — E785 Hyperlipidemia, unspecified: Secondary | ICD-10-CM | POA: Diagnosis present

## 2022-04-22 DIAGNOSIS — M7989 Other specified soft tissue disorders: Secondary | ICD-10-CM | POA: Diagnosis not present

## 2022-04-22 DIAGNOSIS — Z853 Personal history of malignant neoplasm of breast: Secondary | ICD-10-CM

## 2022-04-22 DIAGNOSIS — M25559 Pain in unspecified hip: Secondary | ICD-10-CM | POA: Diagnosis not present

## 2022-04-22 DIAGNOSIS — W19XXXA Unspecified fall, initial encounter: Secondary | ICD-10-CM | POA: Diagnosis not present

## 2022-04-22 DIAGNOSIS — Z1231 Encounter for screening mammogram for malignant neoplasm of breast: Secondary | ICD-10-CM

## 2022-04-22 DIAGNOSIS — R55 Syncope and collapse: Secondary | ICD-10-CM | POA: Diagnosis not present

## 2022-04-22 DIAGNOSIS — M549 Dorsalgia, unspecified: Secondary | ICD-10-CM | POA: Diagnosis present

## 2022-04-22 DIAGNOSIS — G319 Degenerative disease of nervous system, unspecified: Secondary | ICD-10-CM | POA: Diagnosis not present

## 2022-04-22 DIAGNOSIS — Z923 Personal history of irradiation: Secondary | ICD-10-CM | POA: Diagnosis not present

## 2022-04-22 DIAGNOSIS — W010XXA Fall on same level from slipping, tripping and stumbling without subsequent striking against object, initial encounter: Secondary | ICD-10-CM | POA: Diagnosis present

## 2022-04-22 DIAGNOSIS — Y92009 Unspecified place in unspecified non-institutional (private) residence as the place of occurrence of the external cause: Secondary | ICD-10-CM

## 2022-04-22 DIAGNOSIS — Z86711 Personal history of pulmonary embolism: Secondary | ICD-10-CM | POA: Diagnosis present

## 2022-04-22 DIAGNOSIS — R41841 Cognitive communication deficit: Secondary | ICD-10-CM | POA: Diagnosis not present

## 2022-04-22 DIAGNOSIS — M1990 Unspecified osteoarthritis, unspecified site: Secondary | ICD-10-CM | POA: Diagnosis not present

## 2022-04-22 DIAGNOSIS — S72012A Unspecified intracapsular fracture of left femur, initial encounter for closed fracture: Principal | ICD-10-CM

## 2022-04-22 DIAGNOSIS — R0689 Other abnormalities of breathing: Secondary | ICD-10-CM | POA: Diagnosis not present

## 2022-04-22 DIAGNOSIS — M25552 Pain in left hip: Secondary | ICD-10-CM | POA: Diagnosis not present

## 2022-04-22 DIAGNOSIS — Z96642 Presence of left artificial hip joint: Secondary | ICD-10-CM | POA: Diagnosis not present

## 2022-04-22 DIAGNOSIS — G912 (Idiopathic) normal pressure hydrocephalus: Secondary | ICD-10-CM | POA: Diagnosis not present

## 2022-04-22 DIAGNOSIS — Z882 Allergy status to sulfonamides status: Secondary | ICD-10-CM

## 2022-04-22 DIAGNOSIS — J449 Chronic obstructive pulmonary disease, unspecified: Secondary | ICD-10-CM | POA: Diagnosis not present

## 2022-04-22 DIAGNOSIS — E782 Mixed hyperlipidemia: Secondary | ICD-10-CM | POA: Diagnosis not present

## 2022-04-22 DIAGNOSIS — F17200 Nicotine dependence, unspecified, uncomplicated: Secondary | ICD-10-CM | POA: Diagnosis present

## 2022-04-22 DIAGNOSIS — M6281 Muscle weakness (generalized): Secondary | ICD-10-CM | POA: Diagnosis not present

## 2022-04-22 DIAGNOSIS — H3530 Unspecified macular degeneration: Secondary | ICD-10-CM | POA: Diagnosis present

## 2022-04-22 DIAGNOSIS — G8929 Other chronic pain: Secondary | ICD-10-CM | POA: Diagnosis not present

## 2022-04-22 DIAGNOSIS — H9190 Unspecified hearing loss, unspecified ear: Secondary | ICD-10-CM | POA: Diagnosis present

## 2022-04-22 DIAGNOSIS — S72002D Fracture of unspecified part of neck of left femur, subsequent encounter for closed fracture with routine healing: Secondary | ICD-10-CM | POA: Diagnosis not present

## 2022-04-22 DIAGNOSIS — Z8673 Personal history of transient ischemic attack (TIA), and cerebral infarction without residual deficits: Secondary | ICD-10-CM

## 2022-04-22 DIAGNOSIS — R197 Diarrhea, unspecified: Secondary | ICD-10-CM | POA: Diagnosis not present

## 2022-04-22 DIAGNOSIS — S72002A Fracture of unspecified part of neck of left femur, initial encounter for closed fracture: Secondary | ICD-10-CM | POA: Diagnosis present

## 2022-04-22 DIAGNOSIS — S0990XA Unspecified injury of head, initial encounter: Secondary | ICD-10-CM | POA: Diagnosis not present

## 2022-04-22 DIAGNOSIS — I872 Venous insufficiency (chronic) (peripheral): Secondary | ICD-10-CM | POA: Diagnosis present

## 2022-04-22 DIAGNOSIS — Z79899 Other long term (current) drug therapy: Secondary | ICD-10-CM

## 2022-04-22 DIAGNOSIS — R2689 Other abnormalities of gait and mobility: Secondary | ICD-10-CM | POA: Diagnosis not present

## 2022-04-22 DIAGNOSIS — K219 Gastro-esophageal reflux disease without esophagitis: Secondary | ICD-10-CM | POA: Diagnosis present

## 2022-04-22 DIAGNOSIS — D62 Acute posthemorrhagic anemia: Secondary | ICD-10-CM | POA: Diagnosis not present

## 2022-04-22 DIAGNOSIS — F17210 Nicotine dependence, cigarettes, uncomplicated: Secondary | ICD-10-CM | POA: Diagnosis not present

## 2022-04-22 DIAGNOSIS — Z9221 Personal history of antineoplastic chemotherapy: Secondary | ICD-10-CM

## 2022-04-22 DIAGNOSIS — D509 Iron deficiency anemia, unspecified: Secondary | ICD-10-CM | POA: Diagnosis present

## 2022-04-22 DIAGNOSIS — Z9181 History of falling: Secondary | ICD-10-CM | POA: Diagnosis not present

## 2022-04-22 DIAGNOSIS — Z471 Aftercare following joint replacement surgery: Secondary | ICD-10-CM | POA: Diagnosis not present

## 2022-04-22 DIAGNOSIS — I959 Hypotension, unspecified: Secondary | ICD-10-CM | POA: Diagnosis not present

## 2022-04-22 DIAGNOSIS — S72032A Displaced midcervical fracture of left femur, initial encounter for closed fracture: Secondary | ICD-10-CM | POA: Diagnosis not present

## 2022-04-22 DIAGNOSIS — I739 Peripheral vascular disease, unspecified: Secondary | ICD-10-CM | POA: Diagnosis not present

## 2022-04-22 DIAGNOSIS — R9431 Abnormal electrocardiogram [ECG] [EKG]: Secondary | ICD-10-CM | POA: Diagnosis not present

## 2022-04-22 DIAGNOSIS — Z7401 Bed confinement status: Secondary | ICD-10-CM | POA: Diagnosis not present

## 2022-04-22 DIAGNOSIS — R531 Weakness: Secondary | ICD-10-CM | POA: Diagnosis not present

## 2022-04-22 LAB — BASIC METABOLIC PANEL
Anion gap: 9 (ref 5–15)
BUN: 15 mg/dL (ref 8–23)
CO2: 26 mmol/L (ref 22–32)
Calcium: 9.2 mg/dL (ref 8.9–10.3)
Chloride: 104 mmol/L (ref 98–111)
Creatinine, Ser: 0.48 mg/dL (ref 0.44–1.00)
GFR, Estimated: >60 mL/min (ref >60)
Glucose, Bld: 117 mg/dL — ABNORMAL HIGH (ref 70–99)
Potassium: 4.1 mmol/L (ref 3.5–5.1)
Sodium: 139 mmol/L (ref 135–145)

## 2022-04-22 LAB — CBC WITH DIFFERENTIAL/PLATELET
Abs Immature Granulocytes: 0.06 10*3/uL (ref 0.00–0.07)
Basophils Absolute: 0 10*3/uL (ref 0.0–0.1)
Basophils Relative: 0 %
Eosinophils Absolute: 0 10*3/uL (ref 0.0–0.5)
Eosinophils Relative: 0 %
HCT: 39 % (ref 36.0–46.0)
Hemoglobin: 12.9 g/dL (ref 12.0–15.0)
Immature Granulocytes: 1 %
Lymphocytes Relative: 17 %
Lymphs Abs: 1.6 10*3/uL (ref 0.7–4.0)
MCH: 31.5 pg (ref 26.0–34.0)
MCHC: 33.1 g/dL (ref 30.0–36.0)
MCV: 95.4 fL (ref 80.0–100.0)
Monocytes Absolute: 0.5 10*3/uL (ref 0.1–1.0)
Monocytes Relative: 5 %
Neutro Abs: 6.9 10*3/uL (ref 1.7–7.7)
Neutrophils Relative %: 77 %
Platelets: 323 10*3/uL (ref 150–400)
RBC: 4.09 MIL/uL (ref 3.87–5.11)
RDW: 13.3 % (ref 11.5–15.5)
WBC: 9 10*3/uL (ref 4.0–10.5)
nRBC: 0 % (ref 0.0–0.2)

## 2022-04-22 LAB — APTT: aPTT: 30 seconds (ref 24–36)

## 2022-04-22 LAB — PROTIME-INR
INR: 1.1 (ref 0.8–1.2)
Prothrombin Time: 14.5 seconds (ref 11.4–15.2)

## 2022-04-22 LAB — HEPARIN LEVEL (UNFRACTIONATED): Heparin Unfractionated: >1.1 IU/mL — ABNORMAL HIGH (ref 0.30–0.70)

## 2022-04-22 MED ORDER — MORPHINE SULFATE (PF) 4 MG/ML IV SOLN
4.0000 mg | Freq: Once | INTRAVENOUS | Status: AC
  Administered 2022-04-22: 4 mg via INTRAVENOUS
  Filled 2022-04-22: qty 1

## 2022-04-22 MED ORDER — NICOTINE 14 MG/24HR TD PT24: 14.0000 mg | MEDICATED_PATCH | Freq: Every day | TRANSDERMAL | Status: DC

## 2022-04-22 MED ORDER — HYDROCODONE-ACETAMINOPHEN 5-325 MG PO TABS
1.0000 | ORAL_TABLET | Freq: Four times a day (QID) | ORAL | Status: DC | PRN
  Administered 2022-04-24: 2 via ORAL
  Filled 2022-04-22 (×2): qty 2

## 2022-04-22 MED ORDER — SENNA 8.6 MG PO TABS
1.0000 | ORAL_TABLET | Freq: Every day | ORAL | Status: DC
  Administered 2022-04-23 – 2022-04-27 (×2): 8.6 mg via ORAL
  Filled 2022-04-22 (×4): qty 1

## 2022-04-22 MED ORDER — METHOCARBAMOL 500 MG PO TABS: 500.0000 mg | ORAL_TABLET | Freq: Four times a day (QID) | ORAL | Status: DC | PRN

## 2022-04-22 MED ORDER — ATORVASTATIN CALCIUM 10 MG PO TABS
20.0000 mg | ORAL_TABLET | Freq: Every day | ORAL | Status: DC
  Administered 2022-04-23 – 2022-04-29 (×7): 20 mg via ORAL
  Filled 2022-04-22 (×7): qty 2

## 2022-04-22 MED ORDER — MORPHINE SULFATE (PF) 2 MG/ML IV SOLN: 0.5000 mg | INTRAVENOUS | Status: DC | PRN

## 2022-04-22 MED ORDER — METHOCARBAMOL 1000 MG/10ML IJ SOLN
INTRAMUSCULAR | Status: AC
  Filled 2022-04-22: qty 10

## 2022-04-22 MED ORDER — MORPHINE SULFATE (PF) 2 MG/ML IV SOLN
2.0000 mg | INTRAVENOUS | Status: DC | PRN
  Administered 2022-04-22 – 2022-04-23 (×4): 4 mg via INTRAVENOUS
  Administered 2022-04-23: 2 mg via INTRAVENOUS
  Administered 2022-04-23: 4 mg via INTRAVENOUS
  Filled 2022-04-22: qty 2
  Filled 2022-04-22: qty 1
  Filled 2022-04-22 (×4): qty 2

## 2022-04-22 MED ORDER — METHOCARBAMOL 1000 MG/10ML IJ SOLN
500.0000 mg | Freq: Four times a day (QID) | INTRAVENOUS | Status: DC | PRN
  Administered 2022-04-22 – 2022-04-23 (×2): 500 mg via INTRAVENOUS
  Filled 2022-04-22 (×2): qty 5

## 2022-04-22 MED ORDER — HEPARIN (PORCINE) 25000 UT/250ML-% IV SOLN
1400.0000 [IU]/h | INTRAVENOUS | Status: DC
  Administered 2022-04-23: 1100 [IU]/h via INTRAVENOUS
  Administered 2022-04-23: 1250 [IU]/h via INTRAVENOUS
  Administered 2022-04-24: 1400 [IU]/h via INTRAVENOUS
  Filled 2022-04-22 (×3): qty 250

## 2022-04-22 NOTE — ED Notes (Signed)
Pt returned from CT °

## 2022-04-22 NOTE — ED Provider Notes (Signed)
[facility] EMERGENCY DEPARTMENT Provider Note   CSN: [id number] Arrival date & time: 04/22/22  1356     History  Chief Complaint  Patient presents with   Lytle Michaels    [name] is a 76 y.o. female.  With a history of COPD, degenerative disc disease, macular degeneration, iron deficiency anemia, hyperlipidemia, arthritis who presents to the ED for evaluation of a fall.  Patient states that at approximately noon today she was standing in the kitchen when she turned to her left to grab a washcloth off of the counter, lost her balance and fell on her left side.  She states all of her weight fell on to her left hip.  Did not hit her head.  Did not lose consciousness. Complaining of left hip pain. Reports left knee pain that is chronic, maybe worse today.  Denies numbness, tingling, headache, blurry vision, nausea, vomiting   Fall       Home Medications Prior to Admission medications   Medication Sig Start Date End Date Taking? Authorizing Provider  apixaban (ELIQUIS) 5 MG TABS tablet Take 1 tablet (5 mg total) by mouth 2 (two) times daily. 04/16/22   Adrian Prows, MD  atorvastatin (LIPITOR) 20 MG tablet Take 1 tablet (20 mg total) by mouth daily. 08/21/21   Adrian Prows, MD  CALCIUM PO Take 1 tablet by mouth daily.    [provider]  Cholecalciferol (VITAMIN D PO) Take 1 tablet by mouth daily.    [provider]  Multiple Vitamin (MULTIVITAMIN WITH MINERALS) TABS tablet Take 1 tablet by mouth daily.    [provider]  Multiple Vitamins-Minerals (PRESERVISION AREDS 2) CAPS Take by mouth 2 (two) times daily.     [provider]  Omega-3 Fatty Acids (FISH OIL) 1000 MG CAPS Take by mouth daily.    [provider]      Allergies    Sulfa antibiotics    Review of Systems   Review of Systems  Musculoskeletal:  Positive for arthralgias.  All other systems reviewed and are negative.   Physical Exam Updated Vital Signs BP (!) 128/108 (BP  Location: Left Arm)   Pulse 77   Temp 97.7 F (36.5 C) (Oral)   Resp 18   Ht '5\' 8"'$  (1.727 m)   Wt 72.6 kg   SpO2 95%   BMI 24.33 kg/m  Physical Exam Vitals and nursing note reviewed.  Constitutional:      General: She is not in acute distress.    Appearance: Normal appearance. She is well-developed. She is not ill-appearing, toxic-appearing or diaphoretic.  HENT:     Head: Normocephalic and atraumatic.  Eyes:     Extraocular Movements: Extraocular movements intact.     Conjunctiva/sclera: Conjunctivae normal.     Pupils: Pupils are equal, round, and reactive to light.  Cardiovascular:     Rate and Rhythm: Normal rate and regular rhythm.     Pulses:          Radial pulses are 2+ on the right side and 2+ on the left side.       Dorsalis pedis pulses are 1+ on the right side and 1+ on the left side.       Posterior tibial pulses are 1+ on the right side and 1+ on the left side.     Heart sounds: No murmur heard. Pulmonary:     Effort: Pulmonary effort is normal. No respiratory distress.     Breath sounds: Normal breath sounds.  Abdominal:     Palpations: Abdomen is soft.     Tenderness: There is no abdominal tenderness.  Musculoskeletal:        General: No swelling.     Cervical back: Neck supple.  Skin:    General: Skin is warm and dry.     Capillary Refill: Capillary refill takes less than 2 seconds.  Neurological:     Mental Status: She is alert.  Psychiatric:        Mood and Affect: Mood normal.     ED Results / Procedures / Treatments   Labs (all labs ordered are listed, but only abnormal results are displayed) Labs Reviewed  BASIC METABOLIC PANEL  CBC WITH DIFFERENTIAL/PLATELET    EKG None  Radiology DG Hip Unilat W or Wo Pelvis 2-3 Views Left  Result Date: 04/22/2022 CLINICAL DATA:  Fall.  Left hip pain. EXAM: DG HIP (WITH OR WITHOUT PELVIS) 2-3V LEFT COMPARISON:  Left hip radiographs 01/01/2016. FINDINGS: The bones are diffusely demineralized. There  is a mildly displaced subcapital fracture of the left femoral neck. No dislocation or pelvic fracture identified. Stable mild degenerative changes of both hips and sacroiliac joints. IMPRESSION: Mildly displaced subcapital fracture of the left femoral neck. Electronically Signed   By: Richardean Sale M.D.   On: 04/22/2022 14:57    Procedures Procedures    Medications Ordered in ED Medications  morphine (PF) 4 MG/ML injection 4 mg (has no administration in time range)    ED Course/ Medical Decision Making/ A&P Clinical Course as of 04/22/22 1632  Tue Apr 22, 2022  1545 Spoke with Dr. Marcelino Scot who recommends admission to hospitalist [AS]  1626 Spoke with Dr. Tamala Julian who will admit patient for femoral neck fracture [AS]  1626 CT Hip Left Wo Contrast I personally reviewed the image.  Acute fracture of the left femoral neck [AS]    Clinical Course User Index [AS] Alphonzo Devera, [place] Folk, PA-C                           Medical Decision Making Amount and/or Complexity of Data Reviewed Labs: ordered. Radiology: ordered. Decision-making details documented in ED Course.  Risk Prescription drug management. Decision regarding hospitalization.  This patient presents to the ED for concern of fall, left hip pain, this involves an extensive number of treatment options, and is a complaint that carries with it a high risk of complications and morbidity.  The differential diagnosis includes similar hip fracture, contusion   Co morbidities that complicate the patient evaluation   history of PEs on Eliquis  My initial workup includes x-ray left hip, pain control  Additional history obtained from: Nursing notes from this visit.  I ordered, reviewed and interpreted labs which include: BMP, CBC.  Normal   I ordered imaging studies including x-ray left hip, CT head, CT left hip.   I independently visualized and interpreted imaging which showed X-ray shows mildly displaced left femoral neck fracture.   CT hip shows same.  CT head normal I agree with the radiologist interpretation  Consultations Obtained:  I requested consultation with the orthopedic doctor and he,  and discussed lab and imaging findings as well as pertinent plan - they recommend: Admission for surgical consultation  Consulted with hospitalist Dr. Tamala Julian.  He will admit.  Afebrile, hemodynamically stable.  76 year old female presents to the ED for evaluation for fall onto her left hip.  Physical exam shows significant tenderness to the left leg  and inability to move under her own power.  X-ray shows mildly displaced fracture of the left femoral neck.  Orthopedics was consulted and requested CT of the left hip, x-ray left knee and admission to hospitalist.  These were ordered, however patient declined x-ray of the left knee stating it was unnecessary.  Patient was admitted to hospitalist after pain was controlled.  Stable at the time of admission.   Patient's case discussed with Dr. Rogene Houston who agrees with plan to discharge with follow-up.   Note: Portions of this report may have been transcribed using voice recognition software. Every effort was made to ensure accuracy; however, inadvertent computerized transcription errors may still be present.          Final Clinical Impression(s) / ED Diagnoses Final diagnoses:  None    Rx / DC Orders ED Discharge Orders     None         Nehemiah Massed 04/22/22 1904    Godfrey Pick, MD 04/23/22 1027

## 2022-04-22 NOTE — Progress Notes (Signed)
ANTICOAGULATION CONSULT NOTE - Initial Consult  Pharmacy Consult for heparin Indication: atrial fibrillation and history  of PE  Allergies  Allergen Reactions   Sulfa Antibiotics Other (See Comments)    Childhood Allergy  05/17/18 patient denies    Patient Measurements: Height: '5\' 8"'$  (172.7 cm) Weight: 72.6 kg (160 lb) IBW/kg (Calculated) : 63.9 Heparin Dosing Weight: 72 kg  Vital Signs: Temp: 97.7 F (36.5 C) (11/21 1436) Temp Source: Oral (11/21 1436) BP: 128/108 (11/21 1436) Pulse Rate: 70 (11/21 1630)  Labs: Recent Labs    04/22/22 1505  HGB 12.9  HCT 39.0  PLT 323  CREATININE 0.48    Estimated Creatinine Clearance: 58.5 mL/min (by C-G formula based on SCr of 0.48 mg/dL).   Medical History: Past Medical History:  Diagnosis Date   Arthritis    Breast cancer ([place])    left breast/lumpectomy/chemo/rad 2003   COPD (chronic obstructive pulmonary disease) ([place])    DDD (degenerative disc disease), lumbar 08/31/2017   Diverticulitis    GERD (gastroesophageal reflux disease)    Hemoptysis    Hyperlipidemia    Iron deficiency    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 06/15/2021   Macular degeneration    bilateral   Personal history of radiation therapy 2003   Pulmonary embolism ([place])    Syncope and collapse 05/17/2021   Tachycardia     Medications:  (Not in a hospital admission)   Assessment: Pharmacy consulted to dose heparin in patient with atrial fibrillation and history of PE.  Patient is on Eliquis prior to admission with last dose 11/21 @ 1000.  Will need to monitor aPTT until correlation with heparin levels.  CBC WNL Baseline heparin level- pending  Goal of Therapy:  Heparin level 0.3-0.7 units/ml aPTT 66-102 seconds Monitor platelets by anticoagulation protocol: Yes   Plan:  Start heparin infusion @ 2200 Start heparin infusion at 1100 units/hr Check anti-Xa level in 8 hours and daily while on heparin Continue to monitor H&H and  platelets  Margot Ables, PharmD Clinical Pharmacist 04/22/2022 5:06 PM

## 2022-04-22 NOTE — H&P (Signed)
History and Physical    Patient: [name] DOB: 20-Jun-1945 DOA: 04/22/2022 DOS: the patient was seen and examined on 04/22/2022 PCP: [place], MD  Patient coming from: Home Via EMS  Chief Complaint:  Chief Complaint  Patient presents with   Fall   HPI: [name] is a 76 y.o. 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  At baseline patient ambulates with use of a walker and has balance issues related to history of hydrocephalus.  She had in the kitchen when she turned to grab something off the counter and lost her balance falling onto her left hip.  Denies any loss of consciousness or trauma to her head.  She complains of severe pain out of the left hip and was unable to stand or bear weight.  Denies having any chest pain or palpitation prior to the fall.  She had been found to have intermittent episodes of paroxysmal atrial fibrillation back in August of this year for which her cardiologist Dr. Einar Gip she had placed her on Eliquis.  She last took Eliquis this morning.  She had not had a fall in several months.  In the emergency department patient was noted to have stable vital signs.  Labs were unremarkable.  X-ray of the hip noted a mildly displaced subcapital fracture of the left femoral neck.  CT scan of the head noted no acute abnormality and stable dilation of the third and both lateral ventricles.  The [place] of orthopedics was consulted. Patient had been given morphine IV as needed for pain.   Review of  Systems: As mentioned in the history of present illness. All other systems reviewed and are negative. Past Medical History:  Diagnosis Date   Arthritis    Breast cancer ([place])    left breast/lumpectomy/chemo/rad 2003   COPD (chronic obstructive pulmonary disease) ([place])    DDD (degenerative disc disease), lumbar 08/31/2017   Diverticulitis    GERD (gastroesophageal reflux disease)    Hemoptysis    Hyperlipidemia    Iron deficiency    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 06/15/2021   Macular degeneration    bilateral   Personal history of radiation therapy 2003   Pulmonary embolism ([place])    Syncope and collapse 05/17/2021   Tachycardia    Past Surgical History:  Procedure Laterality Date   BACK SURGERY     BREAST LUMPECTOMY     COLONOSCOPY  01/28/2012   Procedure: COLONOSCOPY;  Surgeon: Rogene Houston, MD;  [facility];  Service: Endoscopy;  Laterality: N/A;  1200   COLONOSCOPY  01-2012   ESOPHAGOGASTRODUODENOSCOPY N/A 07/01/2013   Procedure: ESOPHAGOGASTRODUODENOSCOPY (EGD);  Surgeon: Rogene Houston, MD;  [facility];  Service: Endoscopy;  Laterality: N/A;  1:10   LAPAROSCOPIC TUBAL LIGATION  1970   TONSILLECTOMY     Patient was age 53   Social History:  reports that she has been smoking cigarettes. She has a 15.00 pack-year smoking history. She has never used smokeless tobacco. She reports that she does not drink alcohol and does not use drugs.  Allergies  Allergen Reactions   Sulfa Antibiotics Other (See Comments)    Childhood Allergy  05/17/18 patient denies    Family History  Problem Relation Age of Onset   Heart disease Mother    Alzheimer's disease Mother    Heart disease Father    Lung disease Father    Healthy Son     Prior to Admission medications   Medication Sig Start Date End Date Taking? Authorizing Provider  apixaban (ELIQUIS) 5 MG TABS tablet Take 1 tablet (5 mg total) by mouth 2 (two) times daily. 04/16/22   Adrian Prows, MD   atorvastatin (LIPITOR) 20 MG tablet Take 1 tablet (20 mg total) by mouth daily. 08/21/21   Adrian Prows, MD  CALCIUM PO Take 1 tablet by mouth daily.    [provider]  Cholecalciferol (VITAMIN D PO) Take 1 tablet by mouth daily.    [provider]  Multiple Vitamin (MULTIVITAMIN WITH MINERALS) TABS tablet Take 1 tablet by mouth daily.    [provider]  Multiple Vitamins-Minerals (PRESERVISION AREDS 2) CAPS Take by mouth 2 (two) times daily.     [provider]  Omega-3 Fatty Acids (FISH OIL) 1000 MG CAPS Take by mouth daily.    [provider]    Physical Exam: Vitals:   04/22/22 1431 04/22/22 1436  BP:  (!) 128/108  Pulse:  77  Resp:  18  Temp:  97.7 F (36.5 C)  TempSrc:  Oral  SpO2:  95%  Weight: 72.6 kg   Height: '5\' 8"'$  (1.727 m)     Constitutional: Elderly female currently in no acute distress Eyes: PERRL, lids and conjunctivae normal ENMT: Mucous membranes are moist.   Neck: normal, supple  Respiratory: clear to auscultation bilaterally, no wheezing, no crackles. Normal respiratory effort. No accessory muscle use.  Cardiovascular: Regular rhythm. No extremity edema.  Abdomen: no tenderness, no masses palpated. No hepatosplenomegaly. Bowel sounds positive.  Musculoskeletal: no clubbing / cyanosis.  Deformity of the left hip. Skin: no rashes, lesions, ulcers.  Neurologic: CN 2-12 grossly intact.  Patient able to move all extremities.  Psychiatric: Normal judgment and insight. Alert and oriented x 3. Normal mood.   Data Reviewed:  EKG reveals an ectopic atrial rhythm at 70 bpm with left axis deviation.  Reviewed labs, imaging and pertinent records as noted above in the HPI  Assessment and Plan: Left femoral neck fracture secondary to fall Acute.  Patient ambulates with use of a rolling walker at baseline.  Notes tripping and falling sometime around noon this afternoon landing on her left hip.  Denied any loss of consciousness or  trauma to her head -Admit to medical telemetry bed -Hip fracture order set utilized -N.p.o. after midnight -Morphine/hydrocodone as needed for pain -Follow-up interrogation of loop recorder placed in 06/2021 -Appreciate orthopedic consultative services, we will follow-up for any further recommendations  Paroxysmal atrial fibrillation on chronic anticoagulation Patient appears to be in a sinus rhythm.  He had a loop recorder placed in January of this year.  Readings were noted to show concern for paroxysmal atrial fibrillation for which she was started on Eliquis in August of this year by her cardiologist Dr. Einar Gip. CHA2DS2-VASc score = 5. -Hold Eliquis -Heparin per pharmacy  History of pulmonary embolism Patient believes she suffered the pulmonary embolism around 2011.  History of CVA Patient with prior history of strokes in 2019 and 05/2021.  Normal pressure hydrocephalus Patient at baseline has gait instability secondary to hydrocephalus.  CT  scan of the brain noted stable dilation of the ventricles. -Continue outpatient follow-up with neurology  Hyperlipidemia -Continue atorvastatin  History of breast cancer S/p lumpectomy followed by chemotherapy and radiation therapy in 2003.  Tobacco use disorder -Nicotine patch offered  DVT prophylaxis: Heparin Advance Care Planning:   Code Status: Full Code  Consults: Orthopedic surgery  Family Communication: Husband updated at bedside  Severity of Illness: The appropriate patient status for this patient is INPATIENT. Inpatient status is judged to be reasonable and necessary in order to provide the required intensity of service to ensure the patient's safety. The patient's presenting symptoms, physical exam findings, and initial radiographic and laboratory data in the context of their chronic comorbidities is felt to place them at high risk for further clinical deterioration. Furthermore, it is not anticipated that the patient will be  medically stable for discharge from the hospital within 2 midnights of admission.   * I certify that at the point of admission it is my clinical judgment that the patient will require inpatient hospital care spanning beyond 2 midnights from the point of admission due to high intensity of service, high risk for further deterioration and high frequency of surveillance required.*  Author: Norval Morton, MD 04/22/2022 4:18 PM  For on call review [email or web address].

## 2022-04-22 NOTE — ED Provider Notes (Signed)
I provided a substantive portion of the care of this patient.  I personally performed the entirety of the history, exam, and medical decision making for this encounter.      Patient seen by me along with the physician assistant.  Patient walks with a walker that she tripped and fell turning and landing on her left hip.  Patient has evidence of a left subcapital hip fracture.  Patient had CT head that was negative.  Labs without significant abnormalities.  Patient is on Eliquis past history of pulmonary embolism.  Chest with Dr. Ginette Pitman on call orthopedics once patient transferred to [facility].  We will have the hospitalist arrange the admission from here.  Patient updated.  Patient has shortening of the left leg.  Has pain in the left hip.  Past medical history also significant for COPD.   Fredia Sorrow, MD 04/22/22 1623

## 2022-04-22 NOTE — ED Notes (Signed)
Pt states she is unable to tolerate elevating head of bed and unable to swallow pills lying down

## 2022-04-22 NOTE — ED Triage Notes (Signed)
Patient states she walks with a walker and states she tripped and fell turning and landed on her left hip. Patient unable to straighten leg. Patient has shortening and pain to left hip.

## 2022-04-22 NOTE — ED Notes (Addendum)
Pt oxygen declined after recieveing pain meds. Oxygen applied at 2 lpm to keep above 90%.

## 2022-04-23 ENCOUNTER — Encounter (HOSPITAL_COMMUNITY): Payer: Self-pay | Admitting: Internal Medicine

## 2022-04-23 DIAGNOSIS — Z7901 Long term (current) use of anticoagulants: Secondary | ICD-10-CM | POA: Diagnosis not present

## 2022-04-23 DIAGNOSIS — W19XXXA Unspecified fall, initial encounter: Secondary | ICD-10-CM | POA: Diagnosis not present

## 2022-04-23 DIAGNOSIS — S72012A Unspecified intracapsular fracture of left femur, initial encounter for closed fracture: Secondary | ICD-10-CM

## 2022-04-23 DIAGNOSIS — I480 Paroxysmal atrial fibrillation: Secondary | ICD-10-CM | POA: Diagnosis not present

## 2022-04-23 DIAGNOSIS — S72002A Fracture of unspecified part of neck of left femur, initial encounter for closed fracture: Secondary | ICD-10-CM | POA: Diagnosis not present

## 2022-04-23 LAB — CBC
HCT: 36.4 % (ref 36.0–46.0)
Hemoglobin: 12.1 g/dL (ref 12.0–15.0)
MCH: 31.6 pg (ref 26.0–34.0)
MCHC: 33.2 g/dL (ref 30.0–36.0)
MCV: 95 fL (ref 80.0–100.0)
Platelets: 281 10*3/uL (ref 150–400)
RBC: 3.83 MIL/uL — ABNORMAL LOW (ref 3.87–5.11)
RDW: 13 % (ref 11.5–15.5)
WBC: 12.5 10*3/uL — ABNORMAL HIGH (ref 4.0–10.5)
nRBC: 0 % (ref 0.0–0.2)

## 2022-04-23 LAB — APTT
aPTT: 44 seconds — ABNORMAL HIGH (ref 24–36)
aPTT: 48 seconds — ABNORMAL HIGH (ref 24–36)
aPTT: 55 seconds — ABNORMAL HIGH (ref 24–36)

## 2022-04-23 LAB — HEPARIN LEVEL (UNFRACTIONATED): Heparin Unfractionated: 1.08 IU/mL — ABNORMAL HIGH (ref 0.30–0.70)

## 2022-04-23 MED ORDER — MORPHINE SULFATE (PF) 4 MG/ML IV SOLN
INTRAVENOUS | Status: AC
  Administered 2022-04-23: 4 mg
  Filled 2022-04-23: qty 1

## 2022-04-23 MED ORDER — METHOCARBAMOL 500 MG PO TABS
500.0000 mg | ORAL_TABLET | Freq: Three times a day (TID) | ORAL | Status: DC
  Administered 2022-04-23 – 2022-04-28 (×9): 500 mg via ORAL
  Filled 2022-04-23 (×13): qty 1

## 2022-04-23 MED ORDER — ACETAMINOPHEN 325 MG PO TABS
650.0000 mg | ORAL_TABLET | Freq: Four times a day (QID) | ORAL | Status: DC
  Administered 2022-04-23 – 2022-04-28 (×10): 650 mg via ORAL
  Filled 2022-04-23 (×13): qty 2

## 2022-04-23 MED ORDER — METHOCARBAMOL 1000 MG/10ML IJ SOLN
500.0000 mg | Freq: Three times a day (TID) | INTRAVENOUS | Status: DC
  Administered 2022-04-23: 500 mg via INTRAVENOUS
  Filled 2022-04-23: qty 5

## 2022-04-23 MED ORDER — ACETAMINOPHEN 325 MG PO TABS: 650.0000 mg | ORAL_TABLET | Freq: Four times a day (QID) | ORAL | Status: DC | PRN

## 2022-04-23 MED ORDER — HYDROMORPHONE HCL 1 MG/ML IJ SOLN
0.5000 mg | INTRAMUSCULAR | Status: DC | PRN
  Administered 2022-04-24 – 2022-04-26 (×2): 1 mg via INTRAVENOUS
  Filled 2022-04-23 (×3): qty 1

## 2022-04-23 MED ORDER — DEXTROSE IN LACTATED RINGERS 5 % IV SOLN: INTRAVENOUS | Status: AC

## 2022-04-23 MED ORDER — ONDANSETRON HCL 4 MG/2ML IJ SOLN
4.0000 mg | Freq: Four times a day (QID) | INTRAMUSCULAR | Status: DC | PRN
  Administered 2022-04-23 – 2022-04-24 (×3): 4 mg via INTRAVENOUS
  Filled 2022-04-23 (×3): qty 2

## 2022-04-23 NOTE — ED Notes (Addendum)
This RN spoke with Vanita Panda with [facility] [phone]) and he reports Representative Trish Fountain will be contacting the ED with ETA or steps to interrogate loop recorder. Bryson Corona Edd Fabian

## 2022-04-23 NOTE — ED Notes (Signed)
  Transition of Care [facility]) Screening Note   Patient Details  Name: [name] Date of Birth: June 11, 1945   Transition of Care [facility]) CM/SW Contact:    Iona Beard, [place] Phone Number: 04/23/2022, 10:58 AM  TOC consulted for HH/DME/SNF needs. Pt is transferring to [facility] for surgery. TOC to follow for needs after surgery.   Transition of Care Department [facility]) has reviewed patient and no TOC needs have been identified at this time. We will continue to monitor patient advancement through interdisciplinary progression rounds. If new patient transition needs arise, please place a TOC consult.

## 2022-04-23 NOTE — Care Management Important Message (Signed)
Important Message  Patient Details  Name: [name] MRN: [id number] Date of Birth: 05-19-1946   Medicare Important Message Given:  Other (see comment)     Hannah Beat 04/23/2022, 1:39 PM

## 2022-04-23 NOTE — ED Notes (Signed)
Pt asleep.

## 2022-04-23 NOTE — ED Notes (Signed)
[facility] here and report given

## 2022-04-23 NOTE — Consult Note (Signed)
Reason for Consult: left hip fracture (subcapital femoral neck) Referring Physician:   [facility]st[facility]  [name] is an 76 y.o. female.  HPI: The patient is a 76 year old female who sustained a mechanical fall yesterday injuring her left hip.  She was seen at [facility] and found to have a left femoral neck fracture.  She was then transferred today to [facility] for definitive treatment of this fracture.  Some of this is due to the lack of orthopedic coverage over the holidays up at any pain but also out of medical necessity given the patient's significant comorbidities.  She is on Eliquis and did take her last dose yesterday morning.  She is scheduled for this Friday for an anterior total hip arthroplasty to treat her left hip femoral neck fracture.  She is now at [facility].  She is somewhat somnolent right now given having just received pain medication.  I was able to review all of her records within epic as well as her x-rays.  Past Medical History:  Diagnosis Date   Arthritis    Breast cancer ([place])    left breast/lumpectomy/chemo/rad 2003   COPD (chronic obstructive pulmonary disease) ([place])    DDD (degenerative disc disease), lumbar 08/31/2017   Diverticulitis    GERD (gastroesophageal reflux disease)    Hemoptysis    Hyperlipidemia    Iron deficiency    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 06/15/2021   Macular degeneration    bilateral   Personal history of radiation therapy 2003   Pulmonary embolism ([place])    Syncope and collapse 05/17/2021   Tachycardia     Past Surgical History:  Procedure Laterality Date   BACK SURGERY     BREAST LUMPECTOMY     COLONOSCOPY  01/28/2012   Procedure: COLONOSCOPY;  Surgeon: Rogene Houston, MD;  [facility];  Service: Endoscopy;  Laterality: N/A;  1200   COLONOSCOPY  01-2012   ESOPHAGOGASTRODUODENOSCOPY N/A 07/01/2013   Procedure: ESOPHAGOGASTRODUODENOSCOPY (EGD);  Surgeon: Rogene Houston, MD;  [facility];   Service: Endoscopy;  Laterality: N/A;  1:10   LAPAROSCOPIC TUBAL LIGATION  1970   TONSILLECTOMY     Patient was age 72    Family History  Problem Relation Age of Onset   Heart disease Mother    Alzheimer's disease Mother    Heart disease Father    Lung disease Father    Healthy Son     Social History:  reports that she has been smoking cigarettes. She has a 15.00 pack-year smoking history. She has never used smokeless tobacco. She reports that she does not drink alcohol and does not use drugs.  Allergies:  Allergies  Allergen Reactions   Sulfa Antibiotics Other (See Comments)    Childhood Allergy  05/17/18 patient denies    Medications: I have reviewed the patient's current medications.  Results for orders placed or performed during the hospital encounter of 04/22/22 (from the past 48 hour(s))  Basic metabolic panel     Status: Abnormal   Collection Time: 04/22/22  3:05 PM  Result Value Ref Range   Sodium 139 135 - 145 mmol/L   Potassium 4.1 3.5 - 5.1 mmol/L   Chloride 104 98 - 111 mmol/L   CO2 26 22 - 32 mmol/L   Glucose, Bld 117 (H) 70 - 99 mg/dL    Comment: Glucose reference range applies only to samples taken after fasting for at least 8 hours.   BUN  15 8 - 23 mg/dL   Creatinine, Ser 0.48 0.44 - 1.00 mg/dL   Calcium 9.2 8.9 - 10.3 mg/dL   GFR, Estimated >60 >60 mL/min    Comment: (NOTE) Calculated using the CKD-EPI Creatinine Equation (2021)    Anion gap 9 5 - 15    Comment: Performed at [facility], [address], [place], [place] [address]  CBC with Differential     Status: None   Collection Time: 04/22/22  3:05 PM  Result Value Ref Range   WBC 9.0 4.0 - 10.5 K/uL   RBC 4.09 3.87 - 5.11 MIL/uL   Hemoglobin 12.9 12.0 - 15.0 g/dL   HCT 39.0 36.0 - 46.0 %   MCV 95.4 80.0 - 100.0 fL   MCH 31.5 26.0 - 34.0 pg   MCHC 33.1 30.0 - 36.0 g/dL   RDW 13.3 11.5 - 15.5 %   Platelets 323 150 - 400 K/uL   nRBC 0.0 0.0 - 0.2 %   Neutrophils Relative % 77 %   Neutro  Abs 6.9 1.7 - 7.7 K/uL   Lymphocytes Relative 17 %   Lymphs Abs 1.6 0.7 - 4.0 K/uL   Monocytes Relative 5 %   Monocytes Absolute 0.5 0.1 - 1.0 K/uL   Eosinophils Relative 0 %   Eosinophils Absolute 0.0 0.0 - 0.5 K/uL   Basophils Relative 0 %   Basophils Absolute 0.0 0.0 - 0.1 K/uL   Immature Granulocytes 1 %   Abs Immature Granulocytes 0.06 0.00 - 0.07 K/uL    Comment: Performed at [facility], [address], [place], [place] [address]  Heparin level (unfractionated)     Status: Abnormal   Collection Time: 04/22/22  6:12 PM  Result Value Ref Range   Heparin Unfractionated >1.10 (H) 0.30 - 0.70 IU/mL    Comment: (NOTE) The clinical reportable range upper limit is being lowered to >1.10 to align with the FDA approved guidance for the current laboratory assay.  If heparin results are below expected values, and patient dosage has  been confirmed, suggest follow up testing of antithrombin III levels. Performed at [facility], [address], [place], [place] [address]   Protime-INR     Status: None   Collection Time: 04/22/22  6:12 PM  Result Value Ref Range   Prothrombin Time 14.5 11.4 - 15.2 seconds   INR 1.1 0.8 - 1.2    Comment: (NOTE) INR goal varies based on device and disease states. Performed at [facility], [address], [place], [place] [address]   APTT     Status: None   Collection Time: 04/22/22  6:12 PM  Result Value Ref Range   aPTT 30 24 - 36 seconds    Comment: Performed at [facility], [address], [place], [place] [address]  APTT     Status: Abnormal   Collection Time: 04/23/22  8:47 AM  Result Value Ref Range   aPTT 44 (H) 24 - 36 seconds    Comment:        IF BASELINE aPTT IS ELEVATED, SUGGEST PATIENT RISK ASSESSMENT BE USED TO DETERMINE APPROPRIATE ANTICOAGULANT THERAPY. Performed at [facility], [address], [place], [place] [address]   CBC     Status: Abnormal   Collection Time: 04/23/22  8:47 AM  Result Value Ref Range   WBC 12.5 (H)  4.0 - 10.5 K/uL   RBC 3.83 (L) 3.87 - 5.11 MIL/uL   Hemoglobin 12.1 12.0 - 15.0 g/dL   HCT 36.4  36.0 - 46.0 %   MCV 95.0 80.0 - 100.0 fL   MCH 31.6 26.0 - 34.0 pg   MCHC 33.2 30.0 - 36.0 g/dL   RDW 13.0 11.5 - 15.5 %   Platelets 281 150 - 400 K/uL   nRBC 0.0 0.0 - 0.2 %    Comment: Performed at [facility], [address] [place], [place] [address]  Heparin level (unfractionated)     Status: Abnormal   Collection Time: 04/23/22  8:47 AM  Result Value Ref Range   Heparin Unfractionated 1.08 (H) 0.30 - 0.70 IU/mL    Comment: (NOTE) The clinical reportable range upper limit is being lowered to >1.10 to align with the FDA approved guidance for the current laboratory assay.  If heparin results are below expected values, and patient dosage has  been confirmed, suggest follow up testing of antithrombin III levels. Performed at [facility], [address] [place], [place] [address]   APTT     Status: Abnormal   Collection Time: 04/23/22 11:12 AM  Result Value Ref Range   aPTT 48 (H) 24 - 36 seconds    Comment:        IF BASELINE aPTT IS ELEVATED, SUGGEST PATIENT RISK ASSESSMENT BE USED TO DETERMINE APPROPRIATE ANTICOAGULANT THERAPY. Performed at [facility], [address], [place], [place] [address]     CT Hip Left Wo Contrast  Result Date: 04/22/2022 CLINICAL DATA:  Hip trauma, fracture suspected, xray done EXAM: CT OF THE LEFT HIP WITHOUT CONTRAST TECHNIQUE: Multidetector CT imaging of the left hip was performed according to the standard protocol. Multiplanar CT image reconstructions were also generated. RADIATION DOSE REDUCTION: This exam was performed according to the departmental dose-optimization program which includes automated exposure control, adjustment of the mA and/or kV according to patient size and/or use of iterative reconstruction technique. COMPARISON:  Radiograph 04/22/2022 FINDINGS: Bones/Joint/Cartilage There is a displaced subcapital/transcervical femoral neck  fracture with impaction and angulation. Mild left hip osteoarthritis. Mild left SI joint degenerative change. Small joint effusion. Ligaments Suboptimally assessed by CT. Muscles and Tendons No acute myotendinous abnormality by CT. Soft tissues Adjacent soft tissue swelling. IMPRESSION: Acute displaced left femoral neck fracture. Electronically Signed   By: Maurine Simmering M.D.   On: 04/22/2022 15:56   CT Head Wo Contrast  Result Date: 04/22/2022 CLINICAL DATA:  Trauma, fall EXAM: CT HEAD WITHOUT CONTRAST TECHNIQUE: Contiguous axial images were obtained from the base of the skull through the vertex without intravenous contrast. RADIATION DOSE REDUCTION: This exam was performed according to the departmental dose-optimization program which includes automated exposure control, adjustment of the mA and/or kV according to patient size and/or use of iterative reconstruction technique. COMPARISON:  03/27/2021 FINDINGS: Brain: No acute intracranial findings are seen. There is dilation of third and both lateral ventricles with no significant interval change. There is no shift of midline structures. There is decreased density in periventricular white matter. Ventricular dilation is out of proportion to cortical atrophy. Vascular: Unremarkable. Skull: Unremarkable. Sinuses/Orbits: Unremarkable. Other: None. IMPRESSION: No acute intracranial findings are seen There is dilation of third and both lateral ventricles with no significant change. Small-vessel disease. Electronically Signed   By: Elmer Picker M.D.   On: 04/22/2022 15:56   DG Hip Unilat W or Wo Pelvis 2-3 Views Left  Result Date: 04/22/2022 CLINICAL DATA:  Fall.  Left hip pain. EXAM: DG HIP (WITH OR WITHOUT PELVIS) 2-3V LEFT COMPARISON:  Left hip radiographs 01/01/2016. FINDINGS: The bones are diffusely demineralized. There  is a mildly displaced subcapital fracture of the left femoral neck. No dislocation or pelvic fracture identified. Stable mild  degenerative changes of both hips and sacroiliac joints. IMPRESSION: Mildly displaced subcapital fracture of the left femoral neck. Electronically Signed   By: Richardean Sale M.D.   On: 04/22/2022 14:57    Review of Systems Blood pressure (!) 146/49, pulse 81, temperature 98 F (36.7 C), temperature source Oral, resp. rate 12, height '5\' 8"'$  (1.727 m), weight 72.6 kg, SpO2 96 %. Physical Exam Vitals reviewed.  Constitutional:      Appearance: Normal appearance. She is normal weight.  HENT:     Head: Normocephalic and atraumatic.  Cardiovascular:     Rate and Rhythm: Normal rate. Rhythm irregular.  Pulmonary:     Effort: Pulmonary effort is normal.     Breath sounds: Normal breath sounds.  Abdominal:     Palpations: Abdomen is soft.  Musculoskeletal:     Left hip: Tenderness and bony tenderness present. Decreased range of motion. Decreased strength.  Neurological:     General: No focal deficit present.  Psychiatric:        Behavior: Behavior normal.     Assessment/Plan: Displaced left hip subcapital femoral neck fracture  Surgery has been recommended given the patient's fracture of her left hip combined with the pain that she is experiencing.  She is also someone who does ambulate.  Surgery is to avoid other comorbidities associated with untreated femoral neck fractures including pneumonia, bedsores, blood clots among other issues.  The goals of the surgery are decreased pain, improve mobility and overall improve quality of life.  Given the fact that she did have Eliquis yesterday, it is more prudent to schedule surgery for this Friday morning.  That gives the best chance of considering spinal anesthesia if possible.  I will see her again tomorrow on Thanksgiving day to discuss surgery again with her as well as any family members.    Mcarthur Rossetti 04/23/2022, 4:34 PM

## 2022-04-23 NOTE — ED Notes (Addendum)
This RN spoke with Trish Fountain and she reports patient's device will not interfere with surgery and it is only there to monitor her. Pt has a Hydrologist III placed by [facility] (Dr.Ganji). Truddie Crumble  will send last transmission which she reports is from 04/21/22. Bryson Corona Edd Fabian

## 2022-04-23 NOTE — Progress Notes (Signed)
Patient arrived via carelink. Family at bedside. VS stable, Dr. Tamala Julian made aware.

## 2022-04-23 NOTE — Progress Notes (Signed)
Patient transferred to air mattress. °

## 2022-04-23 NOTE — Progress Notes (Signed)
ANTICOAGULATION CONSULT NOTE   Pharmacy Consult for heparin Indication: atrial fibrillation and history  of PE  Allergies  Allergen Reactions   Sulfa Antibiotics Other (See Comments)    Childhood Allergy  05/17/18 patient denies    Patient Measurements: Height: '5\' 8"'$  (172.7 cm) Weight: 72.6 kg (160 lb) IBW/kg (Calculated) : 63.9 Heparin Dosing Weight: 72 kg  Vital Signs: Temp: 97.7 F (36.5 C) (11/22 1911) Temp Source: Oral (11/22 1911) BP: 144/52 (11/22 1911) Pulse Rate: 76 (11/22 1911)  Labs: Recent Labs    04/22/22 1505 04/22/22 1812 04/22/22 1812 04/23/22 0847 04/23/22 1112 04/23/22 2009  HGB 12.9  --   --  12.1  --   --   HCT 39.0  --   --  36.4  --   --   PLT 323  --   --  281  --   --   APTT  --  30   < > 44* 48* 55*  LABPROT  --  14.5  --   --   --   --   INR  --  1.1  --   --   --   --   HEPARINUNFRC  --  >1.10*  --  1.08*  --   --   CREATININE 0.48  --   --   --   --   --    < > = values in this interval not displayed.     Estimated Creatinine Clearance: 58.5 mL/min (by C-G formula based on SCr of 0.48 mg/dL).   Assessment: Pharmacy consulted to dose heparin in patient with atrial fibrillation and history of PE.  Patient is on Eliquis prior to admission with last dose 11/21 @ 1000.  Will need to monitor aPTT until correlation with heparin levels.  aPTT remains subtherapeutic (55 sec) on infusion at 1250 units/hr. No issues with line or bleeding reported per RN.  Goal of Therapy:  Heparin level 0.3-0.7 units/ml aPTT 66-102 seconds Monitor platelets by anticoagulation protocol: Yes   Plan:  Increase heparin infusion to 1400 units/hr F/u aPTT and heparin level in 8 hours  Sherlon Handing, PharmD, BCPS Please see amion for complete clinical pharmacist phone list 04/23/2022 8:47 PM

## 2022-04-23 NOTE — Progress Notes (Signed)
ANTICOAGULATION CONSULT NOTE - I  Pharmacy Consult for heparin Indication: atrial fibrillation and history  of PE  Allergies  Allergen Reactions   Sulfa Antibiotics Other (See Comments)    Childhood Allergy  05/17/18 patient denies    Patient Measurements: Height: '5\' 8"'$  (172.7 cm) Weight: 72.6 kg (160 lb) IBW/kg (Calculated) : 63.9 Heparin Dosing Weight: 72 kg  Vital Signs: Temp: 97.7 F (36.5 C) (11/22 0700) Temp Source: Oral (11/22 0700) BP: 139/57 (11/22 0900) Pulse Rate: 73 (11/22 0900)  Labs: Recent Labs    04/22/22 1505 04/22/22 1812 04/23/22 0847  HGB 12.9  --  12.1  HCT 39.0  --  36.4  PLT 323  --  281  APTT  --  30 44*  LABPROT  --  14.5  --   INR  --  1.1  --   HEPARINUNFRC  --  >1.10* 1.08*  CREATININE 0.48  --   --      Estimated Creatinine Clearance: 58.5 mL/min (by C-G formula based on SCr of 0.48 mg/dL).   Medical History: Past Medical History:  Diagnosis Date   Arthritis    Breast cancer ([place])    left breast/lumpectomy/chemo/rad 2003   COPD (chronic obstructive pulmonary disease) ([place])    DDD (degenerative disc disease), lumbar 08/31/2017   Diverticulitis    GERD (gastroesophageal reflux disease)    Hemoptysis    Hyperlipidemia    Iron deficiency    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 06/15/2021   Macular degeneration    bilateral   Personal history of radiation therapy 2003   Pulmonary embolism ([place])    Syncope and collapse 05/17/2021   Tachycardia     Medications:  (Not in a hospital admission)  Assessment: Pharmacy consulted to dose heparin in patient with atrial fibrillation and history of PE.  Patient is on Eliquis prior to admission with last dose 11/21 @ 1000.  Will need to monitor aPTT until correlation with heparin levels. HL 1.08, not correlating APTT 44, subtherapeutic CBC WNL  Goal of Therapy:  Heparin level 0.3-0.7 units/ml aPTT 66-102 seconds Monitor platelets by anticoagulation protocol: Yes   Plan:   Increase heparin infusion to 1250 units/hr Check anti-Xa level in 8 hours and daily while on heparin Continue to monitor H&H and platelets  Isac Sarna, BS Pharm D, BCPS Clinical Pharmacist 04/23/2022 10:38 AM

## 2022-04-23 NOTE — Progress Notes (Signed)
Orthopaedic Trauma Service   Ortho aware of patient  Displaced L subcapital femoral neck fracture  Pt is on the schedule for Dr. Ninfa Linden for Friday, 04/25/2022, for L total hip arthroplasty   Full consult to follow   Jari Pigg, PA-C [phone] (C) 04/23/2022, 1:13 PM  [facility] [place] [address] [phone] Domingo Sep (F)      Patient ID: CALLAN YONTZ, female   DOB: 1946/04/07, 76 y.o.   MRN: [id number]

## 2022-04-23 NOTE — ED Notes (Signed)
[facility] dispatch called at this time and notified of bed ready status.

## 2022-04-23 NOTE — ED Notes (Signed)
Received report from [place]. Pt lying supine and does not want to be moved or head up due to increased pain with movement. Pedal pulses present and equal. Extremity color wnl bilateral. A/o.

## 2022-04-23 NOTE — Progress Notes (Signed)
Orthopaedic Trauma Service (OTS) Consult   Patient ID: [name] MRN: [id number] DOB/AGE: 78/28/1947 78 y.o.    HPI: [name] is an 76 y.o. female with complex medical history including history of TIA and embolic stroke, normal pressure hydrocephalus, chronic back pain, remote history of PE, recent incidence (August 2023) of paroxysmal atrial fibrillation placed on Eliquis who sustained a ground-level fall at home yesterday.  Patient ambulates with a walker at baseline.  She lives with her husband single-story house with 4 stairs to gain entry into the dwelling.  She was brought to [facility] where she was found to have a subcapital left femoral neck fracture.  As there was no local orthopedic coverage at [facility] she was transferred to [facility] for definitive management.  Based off of her films decision was made that the patient would need arthroplasty to address her fracture.  Patient seen and evaluated.  Family at bedside.  Patient is a little behind on pain control.  Nurses actively working to get her pain under control.  Patient does report some chronic pain in her right leg but nothing new since his fall.  She is hesitant to move her left leg due to pain in her hip.  I did offer some Buck's traction but patient declined.  Past Medical History:  Diagnosis Date   Arthritis    Breast cancer ([place])    left breast/lumpectomy/chemo/rad 2003   COPD (chronic obstructive pulmonary disease) ([place])    DDD (degenerative disc disease), lumbar 08/31/2017   Diverticulitis    GERD (gastroesophageal reflux disease)    Hemoptysis    Hyperlipidemia    Iron deficiency    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 06/15/2021   Macular degeneration    bilateral   Personal history of radiation therapy 2003   Pulmonary embolism ([place])    Syncope and collapse 05/17/2021   Tachycardia     Past Surgical History:  Procedure Laterality Date   BACK SURGERY      BREAST LUMPECTOMY     COLONOSCOPY  01/28/2012   Procedure: COLONOSCOPY;  Surgeon: Rogene Houston, MD;  [facility];  Service: Endoscopy;  Laterality: N/A;  1200   COLONOSCOPY  01-2012   ESOPHAGOGASTRODUODENOSCOPY N/A 07/01/2013   Procedure: ESOPHAGOGASTRODUODENOSCOPY (EGD);  Surgeon: Rogene Houston, MD;  [facility];  Service: Endoscopy;  Laterality: N/A;  1:10   LAPAROSCOPIC TUBAL LIGATION  1970   TONSILLECTOMY     Patient was age 16    Family History  Problem Relation Age of Onset   Heart disease Mother    Alzheimer's disease Mother    Heart disease Father    Lung disease Father    Healthy Son     Social History:  reports that she has been smoking cigarettes. She has a 15.00 pack-year smoking history. She has never used smokeless tobacco. She reports that she does not drink alcohol and does not use drugs.  Allergies:  Allergies  Allergen Reactions   Sulfa Antibiotics Other (See Comments)    Childhood Allergy  05/17/18 patient denies    Medications: I have reviewed the patient's current medications.  Results for orders placed or performed during the hospital encounter of 04/22/22 (from the past 48 hour(s))  Basic metabolic panel     Status: Abnormal   Collection Time: 04/22/22  3:05 PM  Result Value Ref Range   Sodium 139 135 - 145 mmol/L  Potassium 4.1 3.5 - 5.1 mmol/L   Chloride 104 98 - 111 mmol/L   CO2 26 22 - 32 mmol/L   Glucose, Bld 117 (H) 70 - 99 mg/dL    Comment: Glucose reference range applies only to samples taken after fasting for at least 8 hours.   BUN 15 8 - 23 mg/dL   Creatinine, Ser 0.48 0.44 - 1.00 mg/dL   Calcium 9.2 8.9 - 10.3 mg/dL   GFR, Estimated >60 >60 mL/min    Comment: (NOTE) Calculated using the CKD-EPI Creatinine Equation (2021)    Anion gap 9 5 - 15    Comment: Performed at [facility], [address], [place], [place] [address]  CBC with Differential     Status: None   Collection Time: 04/22/22  3:05 PM   Result Value Ref Range   WBC 9.0 4.0 - 10.5 K/uL   RBC 4.09 3.87 - 5.11 MIL/uL   Hemoglobin 12.9 12.0 - 15.0 g/dL   HCT 39.0 36.0 - 46.0 %   MCV 95.4 80.0 - 100.0 fL   MCH 31.5 26.0 - 34.0 pg   MCHC 33.1 30.0 - 36.0 g/dL   RDW 13.3 11.5 - 15.5 %   Platelets 323 150 - 400 K/uL   nRBC 0.0 0.0 - 0.2 %   Neutrophils Relative % 77 %   Neutro Abs 6.9 1.7 - 7.7 K/uL   Lymphocytes Relative 17 %   Lymphs Abs 1.6 0.7 - 4.0 K/uL   Monocytes Relative 5 %   Monocytes Absolute 0.5 0.1 - 1.0 K/uL   Eosinophils Relative 0 %   Eosinophils Absolute 0.0 0.0 - 0.5 K/uL   Basophils Relative 0 %   Basophils Absolute 0.0 0.0 - 0.1 K/uL   Immature Granulocytes 1 %   Abs Immature Granulocytes 0.06 0.00 - 0.07 K/uL    Comment: Performed at [facility], [address], [place], [place] [address]  Heparin level (unfractionated)     Status: Abnormal   Collection Time: 04/22/22  6:12 PM  Result Value Ref Range   Heparin Unfractionated >1.10 (H) 0.30 - 0.70 IU/mL    Comment: (NOTE) The clinical reportable range upper limit is being lowered to >1.10 to align with the FDA approved guidance for the current laboratory assay.  If heparin results are below expected values, and patient dosage has  been confirmed, suggest follow up testing of antithrombin III levels. Performed at [facility], [address] [place], [place] [address]   Protime-INR     Status: None   Collection Time: 04/22/22  6:12 PM  Result Value Ref Range   Prothrombin Time 14.5 11.4 - 15.2 seconds   INR 1.1 0.8 - 1.2    Comment: (NOTE) INR goal varies based on device and disease states. Performed at [facility], [address], [place], [place] [address]   APTT     Status: None   Collection Time: 04/22/22  6:12 PM  Result Value Ref Range   aPTT 30 24 - 36 seconds    Comment: Performed at [facility], [address] [place], [place] [address]  APTT     Status: Abnormal   Collection Time: 04/23/22  8:47 AM  Result Value Ref Range    aPTT 44 (H) 24 - 36 seconds    Comment:        IF BASELINE aPTT IS ELEVATED, SUGGEST PATIENT RISK ASSESSMENT BE USED TO DETERMINE APPROPRIATE ANTICOAGULANT THERAPY. Performed at [facility], [address] [place],  [place] [address]   CBC     Status: Abnormal   Collection Time: 04/23/22  8:47 AM  Result Value Ref Range   WBC 12.5 (H) 4.0 - 10.5 K/uL   RBC 3.83 (L) 3.87 - 5.11 MIL/uL   Hemoglobin 12.1 12.0 - 15.0 g/dL   HCT 36.4 36.0 - 46.0 %   MCV 95.0 80.0 - 100.0 fL   MCH 31.6 26.0 - 34.0 pg   MCHC 33.2 30.0 - 36.0 g/dL   RDW 13.0 11.5 - 15.5 %   Platelets 281 150 - 400 K/uL   nRBC 0.0 0.0 - 0.2 %    Comment: Performed at [facility], [address] [place], [place] [address]  Heparin level (unfractionated)     Status: Abnormal   Collection Time: 04/23/22  8:47 AM  Result Value Ref Range   Heparin Unfractionated 1.08 (H) 0.30 - 0.70 IU/mL    Comment: (NOTE) The clinical reportable range upper limit is being lowered to >1.10 to align with the FDA approved guidance for the current laboratory assay.  If heparin results are below expected values, and patient dosage has  been confirmed, suggest follow up testing of antithrombin III levels. Performed at [facility], [address] [place], [place] [address]   APTT     Status: Abnormal   Collection Time: 04/23/22 11:12 AM  Result Value Ref Range   aPTT 48 (H) 24 - 36 seconds    Comment:        IF BASELINE aPTT IS ELEVATED, SUGGEST PATIENT RISK ASSESSMENT BE USED TO DETERMINE APPROPRIATE ANTICOAGULANT THERAPY. Performed at [facility], [address] [place], [place] [address]     CT Hip Left Wo Contrast  Result Date: 04/22/2022 CLINICAL DATA:  Hip trauma, fracture suspected, xray done EXAM: CT OF THE LEFT HIP WITHOUT CONTRAST TECHNIQUE: Multidetector CT imaging of the left hip was performed according to the standard protocol. Multiplanar CT image reconstructions were also generated. RADIATION DOSE REDUCTION: This  exam was performed according to the departmental dose-optimization program which includes automated exposure control, adjustment of the mA and/or kV according to patient size and/or use of iterative reconstruction technique. COMPARISON:  Radiograph 04/22/2022 FINDINGS: Bones/Joint/Cartilage There is a displaced subcapital/transcervical femoral neck fracture with impaction and angulation. Mild left hip osteoarthritis. Mild left SI joint degenerative change. Small joint effusion. Ligaments Suboptimally assessed by CT. Muscles and Tendons No acute myotendinous abnormality by CT. Soft tissues Adjacent soft tissue swelling. IMPRESSION: Acute displaced left femoral neck fracture. Electronically Signed   By: Maurine Simmering M.D.   On: 04/22/2022 15:56   CT Head Wo Contrast  Result Date: 04/22/2022 CLINICAL DATA:  Trauma, fall EXAM: CT HEAD WITHOUT CONTRAST TECHNIQUE: Contiguous axial images were obtained from the base of the skull through the vertex without intravenous contrast. RADIATION DOSE REDUCTION: This exam was performed according to the departmental dose-optimization program which includes automated exposure control, adjustment of the mA and/or kV according to patient size and/or use of iterative reconstruction technique. COMPARISON:  03/27/2021 FINDINGS: Brain: No acute intracranial findings are seen. There is dilation of third and both lateral ventricles with no significant interval change. There is no shift of midline structures. There is decreased density in periventricular white matter. Ventricular dilation is out of proportion to cortical atrophy. Vascular: Unremarkable. Skull: Unremarkable. Sinuses/Orbits: Unremarkable. Other: None. IMPRESSION: No acute intracranial findings are seen There is dilation of third and both lateral ventricles with no significant change. Small-vessel disease. Electronically Signed   By: Royston Cowper  Rathinasamy M.D.   On: 04/22/2022 15:56   DG Hip Unilat W or Wo Pelvis 2-3 Views  Left  Result Date: 04/22/2022 CLINICAL DATA:  Fall.  Left hip pain. EXAM: DG HIP (WITH OR WITHOUT PELVIS) 2-3V LEFT COMPARISON:  Left hip radiographs 01/01/2016. FINDINGS: The bones are diffusely demineralized. There is a mildly displaced subcapital fracture of the left femoral neck. No dislocation or pelvic fracture identified. Stable mild degenerative changes of both hips and sacroiliac joints. IMPRESSION: Mildly displaced subcapital fracture of the left femoral neck. Electronically Signed   By: Richardean Sale M.D.   On: 04/22/2022 14:57    Intake/Output    None      Review of Systems  Constitutional:  Negative for chills and fever.  Respiratory:  Negative for shortness of breath and wheezing.   Cardiovascular:  Negative for chest pain and palpitations.  Gastrointestinal:  Negative for nausea and vomiting.  Musculoskeletal:        Left hip pain   Neurological:  Negative for tingling and sensory change.   Blood pressure (!) 146/49, pulse 81, temperature 98 F (36.7 C), temperature source Oral, resp. rate 12, height '5\' 8"'$  (1.727 m), weight 72.6 kg, SpO2 96 %. Physical Exam Constitutional:      Appearance: She is normal weight.     Comments: Uncomfortable appearing, hard of hearing   Cardiovascular:     Heart sounds: S1 normal and S2 normal.  Pulmonary:     Comments: unlabored Abdominal:     Comments: Soft, NT   Musculoskeletal:     Comments: Left Lower Extremity  No open wounds to L hip  Mild swelling Pain and tenderness with exam to left hip  Knee with mild tenderness but no gross crepitus Apprehensive and will not move toes or ankle  Ext warm  DPN, SPN, TN sensation at baseline and symmetric to contralateral side + DP pulse No pitting edema    Skin:    General: Skin is warm.  Neurological:     Mental Status: She is alert.     Comments: Did not assess coordination or gait   Psychiatric:        Behavior: Behavior is cooperative.      Assessment/Plan:  76 y/o  female s/p ground level fall with displaced L femoral neck fracture   -ground level fall with fragility fracture left femoral neck  - displaced L femoral neck fracture   OR Friday with Dr. Ninfa Linden for arthroplasty L hip   Strongly encourage pt to sit up in bed during waking hours from now until surgery     Air mattress, turn q 2 h and PRN, IS q1h while awake    Ice prn    Optimize pain management   - Pain management:  Scheduled tylenol and robaxin   PRN norco   PRN IV dilaudid  - Medical issues   Per primary   - DVT/PE prophylaxis:  On heparin since admission, eliquis on hold  Heparin will need to be held starting 0001 on 04/25/2022 - ID:   Periop   - Metabolic Bone Disease:  Fragility fracture indicates osteoporosis   - Activity:  Sit up in bed   Therapies post op    - Dispo:  OR Friday with Dr. Ninfa Linden    Family states pt will not want to go to SNF but might be agreeable to CIR if deemed appropriate     Jari Pigg, PA-C [phone] (C) 04/23/2022, 3:17 PM  [facility] [place] [address] [phone] Domingo Sep (F)    After 5pm and on the weekends please log on to Amion, go to orthopaedics and the look under the [facility] Call for the provider(s) on call. You can also call our office at [phone] and then follow the prompts to be connected to the call team. Patient ID: [name], female   DOB: 02-28-1946, 76 y.o.   MRN: [id number]

## 2022-04-23 NOTE — ED Notes (Signed)
Pt reports nausea, hospitalist notified

## 2022-04-23 NOTE — Progress Notes (Signed)
11/22 Pt under [facility], IM Letter respectfully not given.

## 2022-04-23 NOTE — ED Notes (Signed)
Dr Roderic Palau chatted due to pt/husband a little upset of wait.

## 2022-04-23 NOTE — Progress Notes (Signed)
PROGRESS NOTE    [name]  LHT:[id number] DOB: [birth year] DOA: 04/22/2022 PCP: Lucerne Nation, MD    Brief Narrative:  76 year old female with a history of atrial fibrillation on anticoagulation, NPH, presents to the hospital after a fall at home.  Patient was making a turn in her kitchen when she lost her balance and fell.  She was found to have a left femoral neck fracture.  Case was discussed with orthopedics in [place].  She is being transferred to [facility] for further operative management.  Her Eliquis has been held in preparation for surgery and she has been started on heparin infusion as a bridge.  Heart rate is currently stable.   Assessment & Plan:   Principal Problem:   Left displaced femoral neck fracture (HCC) Active Problems:   Fall at home, initial encounter   Paroxysmal atrial fibrillation (HCC)   Chronic anticoagulation   History of pulmonary embolus (PE)   H/O: CVA (cerebrovascular accident)   NPH (normal pressure hydrocephalus) (HCC)   Hyperlipidemia   History of left breast cancer   Tobacco use disorder   Left femoral neck fracture secondary to fall Acute.  Patient ambulates with use of a rolling walker at baseline.  Notes tripping and falling sometime around noon this afternoon landing on her left hip.  Denied any loss of consciousness or trauma to her head -Admit to medical telemetry bed -Hip fracture order set utilized -Discussed with orthopedics, Ainsley Spinner, PA who informed that patient surgery would be performed on 11/24 by Dr. Ninfa Linden -Restart patient's diet for now -Continue pain management with morphine/hydrocodone as needed for pain -Follow-up interrogation of loop recorder placed in 06/2021, order placed for interrogation with device name and serial number -Appreciate orthopedic consultative services, we will follow-up for any further recommendations   Paroxysmal atrial fibrillation on chronic anticoagulation Patient appears to be in  a sinus rhythm.  She had a loop recorder placed in January of this year.  Readings were noted to show concern for paroxysmal atrial fibrillation for which she was started on Eliquis in August of this year by her cardiologist Dr. Einar Gip. CHA2DS2-VASc score = 5. -It does not appear that she is on a chronic rate control.  Heart rate is currently stable -Hold Eliquis -Heparin per pharmacy   History of pulmonary embolism Patient believes she suffered the pulmonary embolism around 2011.   History of CVA Patient with prior history of strokes in 2019 and 05/2021.   Normal pressure hydrocephalus Patient at baseline, has gait instability secondary to hydrocephalus.  CT scan of the brain noted stable dilation of the ventricles. -Continue outpatient follow-up with neurology   Hyperlipidemia -Continue atorvastatin   History of breast cancer S/p lumpectomy followed by chemotherapy and radiation therapy in 2003.   Tobacco use disorder -Nicotine patch offered   DVT prophylaxis: Heparin infusion  Code Status: Full code Family Communication: Husband at bedside Disposition Plan: Status is: Inpatient Remains inpatient appropriate because: Transfer to [facility] for further operative management     Consultants:  Orthopedics  Procedures:    Antimicrobials:      Subjective: Patient is laying flat in bed.  Reports continued hip pain that is not helped with medications.  She is aggravated about having to wait in the emergency room to be transferred to [facility].  Objective: Vitals:   04/23/22 1200 04/23/22 1215 04/23/22 1218 04/23/22 1230  BP: (!) 131/52   (!) 139/51  Pulse: 72 74  72  Resp: 14 12  12  Temp:      TempSrc:      SpO2: 97% (!) 87% 95% 98%  Weight:      Height:       No intake or output data in the 24 hours ending 04/23/22 1307 Filed Weights   04/22/22 1431  Weight: 72.6 kg    Examination:  General exam: Appears calm and comfortable  Respiratory system: Clear to  auscultation. Respiratory effort normal. Cardiovascular system: S1 & S2 heard, RRR. No JVD, murmurs, rubs, gallops or clicks.  Gastrointestinal system: Abdomen is nondistended, soft and nontender. No organomegaly or masses felt. Normal bowel sounds heard. Central nervous system: Alert and oriented. No focal neurological deficits. Extremities: Trace bilateral lower extremity edema which has been reports is chronic Skin: No rashes, lesions or ulcers Psychiatry: Judgement and insight appear normal. Mood & affect appropriate.     Data Reviewed: I have personally reviewed following labs and imaging studies  CBC: Recent Labs  Lab 04/22/22 1505 04/23/22 0847  WBC 9.0 12.5*  NEUTROABS 6.9  --   HGB 12.9 12.1  HCT 39.0 36.4  MCV 95.4 95.0  PLT 323 952   Basic Metabolic Panel: Recent Labs  Lab 04/22/22 1505  NA 139  K 4.1  CL 104  CO2 26  GLUCOSE 117*  BUN 15  CREATININE 0.48  CALCIUM 9.2   GFR: Estimated Creatinine Clearance: 58.5 mL/min (by C-G formula based on SCr of 0.48 mg/dL). Liver Function Tests: No results for input(s): "AST", "ALT", "ALKPHOS", "BILITOT", "PROT", "ALBUMIN" in the last 168 hours. No results for input(s): "LIPASE", "AMYLASE" in the last 168 hours. No results for input(s): "AMMONIA" in the last 168 hours. Coagulation Profile: Recent Labs  Lab 04/22/22 1812  INR 1.1   Cardiac Enzymes: No results for input(s): "CKTOTAL", "CKMB", "CKMBINDEX", "TROPONINI" in the last 168 hours. BNP (last 3 results) No results for input(s): "PROBNP" in the last 8760 hours. HbA1C: No results for input(s): "HGBA1C" in the last 72 hours. CBG: No results for input(s): "GLUCAP" in the last 168 hours. Lipid Profile: No results for input(s): "CHOL", "HDL", "LDLCALC", "TRIG", "CHOLHDL", "LDLDIRECT" in the last 72 hours. Thyroid Function Tests: No results for input(s): "TSH", "T4TOTAL", "FREET4", "T3FREE", "THYROIDAB" in the last 72 hours. Anemia Panel: No results for  input(s): "VITAMINB12", "FOLATE", "FERRITIN", "TIBC", "IRON", "RETICCTPCT" in the last 72 hours. Sepsis Labs: No results for input(s): "PROCALCITON", "LATICACIDVEN" in the last 168 hours.  No results found for this or any previous visit (from the past 240 hour(s)).       Radiology Studies: CT Hip Left Wo Contrast  Result Date: 04/22/2022 CLINICAL DATA:  Hip trauma, fracture suspected, xray done EXAM: CT OF THE LEFT HIP WITHOUT CONTRAST TECHNIQUE: Multidetector CT imaging of the left hip was performed according to the standard protocol. Multiplanar CT image reconstructions were also generated. RADIATION DOSE REDUCTION: This exam was performed according to the departmental dose-optimization program which includes automated exposure control, adjustment of the mA and/or kV according to patient size and/or use of iterative reconstruction technique. COMPARISON:  Radiograph 04/22/2022 FINDINGS: Bones/Joint/Cartilage There is a displaced subcapital/transcervical femoral neck fracture with impaction and angulation. Mild left hip osteoarthritis. Mild left SI joint degenerative change. Small joint effusion. Ligaments Suboptimally assessed by CT. Muscles and Tendons No acute myotendinous abnormality by CT. Soft tissues Adjacent soft tissue swelling. IMPRESSION: Acute displaced left femoral neck fracture. Electronically Signed   By: Maurine Simmering M.D.   On: 04/22/2022 15:56   CT Head Wo  Contrast  Result Date: 04/22/2022 CLINICAL DATA:  Trauma, fall EXAM: CT HEAD WITHOUT CONTRAST TECHNIQUE: Contiguous axial images were obtained from the base of the skull through the vertex without intravenous contrast. RADIATION DOSE REDUCTION: This exam was performed according to the departmental dose-optimization program which includes automated exposure control, adjustment of the mA and/or kV according to patient size and/or use of iterative reconstruction technique. COMPARISON:  03/27/2021 FINDINGS: Brain: No acute  intracranial findings are seen. There is dilation of third and both lateral ventricles with no significant interval change. There is no shift of midline structures. There is decreased density in periventricular white matter. Ventricular dilation is out of proportion to cortical atrophy. Vascular: Unremarkable. Skull: Unremarkable. Sinuses/Orbits: Unremarkable. Other: None. IMPRESSION: No acute intracranial findings are seen There is dilation of third and both lateral ventricles with no significant change. Small-vessel disease. Electronically Signed   By: Elmer Picker M.D.   On: 04/22/2022 15:56   DG Hip Unilat W or Wo Pelvis 2-3 Views Left  Result Date: 04/22/2022 CLINICAL DATA:  Fall.  Left hip pain. EXAM: DG HIP (WITH OR WITHOUT PELVIS) 2-3V LEFT COMPARISON:  Left hip radiographs 01/01/2016. FINDINGS: The bones are diffusely demineralized. There is a mildly displaced subcapital fracture of the left femoral neck. No dislocation or pelvic fracture identified. Stable mild degenerative changes of both hips and sacroiliac joints. IMPRESSION: Mildly displaced subcapital fracture of the left femoral neck. Electronically Signed   By: Richardean Sale M.D.   On: 04/22/2022 14:57        Scheduled Meds:  atorvastatin  20 mg Oral Daily   nicotine  14 mg Transdermal Daily   senna  1 tablet Oral QHS   Continuous Infusions:  heparin 1,250 Units/hr (04/23/22 1113)   methocarbamol (ROBAXIN) IV Stopped (04/23/22 0815)     LOS: 1 day    Time spent: 6mns    JKathie Dike MD [facility]   If 7PM-7AM, please contact night-coverage [email or web address]  04/23/2022, 1:07 PM

## 2022-04-24 DIAGNOSIS — S72002A Fracture of unspecified part of neck of left femur, initial encounter for closed fracture: Secondary | ICD-10-CM | POA: Diagnosis not present

## 2022-04-24 LAB — CBC
HCT: 35.8 % — ABNORMAL LOW (ref 36.0–46.0)
Hemoglobin: 11.6 g/dL — ABNORMAL LOW (ref 12.0–15.0)
MCH: 31.3 pg (ref 26.0–34.0)
MCHC: 32.4 g/dL (ref 30.0–36.0)
MCV: 96.5 fL (ref 80.0–100.0)
Platelets: 269 10*3/uL (ref 150–400)
RBC: 3.71 MIL/uL — ABNORMAL LOW (ref 3.87–5.11)
RDW: 13 % (ref 11.5–15.5)
WBC: 11.1 10*3/uL — ABNORMAL HIGH (ref 4.0–10.5)
nRBC: 0 % (ref 0.0–0.2)

## 2022-04-24 LAB — HEPARIN LEVEL (UNFRACTIONATED)
Heparin Unfractionated: 0.98 IU/mL — ABNORMAL HIGH (ref 0.30–0.70)
Heparin Unfractionated: >1.1 IU/mL — ABNORMAL HIGH (ref 0.30–0.70)

## 2022-04-24 LAB — APTT
aPTT: 90 seconds — ABNORMAL HIGH (ref 24–36)
aPTT: 135 seconds — ABNORMAL HIGH (ref 24–36)

## 2022-04-24 LAB — ABO/RH: ABO/RH(D): A POS

## 2022-04-24 MED ORDER — ALUM & MAG HYDROXIDE-SIMETH 200-200-20 MG/5ML PO SUSP
15.0000 mL | ORAL | Status: DC | PRN
  Administered 2022-04-24 – 2022-04-27 (×3): 15 mL via ORAL
  Filled 2022-04-24 (×4): qty 30

## 2022-04-24 MED ORDER — SODIUM CHLORIDE 0.9 % IV SOLN: INTRAVENOUS | Status: AC

## 2022-04-24 MED ORDER — HEPARIN (PORCINE) 25000 UT/250ML-% IV SOLN
1250.0000 [IU]/h | INTRAVENOUS | Status: DC
  Administered 2022-04-24: 1250 [IU]/h via INTRAVENOUS

## 2022-04-24 MED ORDER — CALCIUM CARBONATE ANTACID 500 MG PO CHEW
1.0000 | CHEWABLE_TABLET | Freq: Three times a day (TID) | ORAL | Status: DC | PRN
  Administered 2022-04-24: 200 mg via ORAL
  Filled 2022-04-24 (×2): qty 1

## 2022-04-24 NOTE — Progress Notes (Signed)
PROGRESS NOTE  [name] IZT:[id number] DOB: 27-Jul-1945 DOA: 04/22/2022 PCP: Rutledge Nation, MD   LOS: 2 days   Brief Narrative / Interim history: 76 year old female with a history of atrial fibrillation on anticoagulation, NPH, presents to the hospital after a fall at home.  Patient was making a turn in her kitchen when she lost her balance and fell.  She was found to have a left femoral neck fracture.  Case was discussed with orthopedics in [place].  She is being transferred to [facility] for further operative management.  Her Eliquis has been held in preparation for surgery and she has been started on heparin infusion as a bridge.  Heart rate is currently stable.   Subjective / 24h Interval events: Pain controlled as long as she is not moving.  No nausea or vomiting.  Assesement and Plan: Principal Problem:   Left displaced femoral neck fracture (HCC) Active Problems:   Fall at home, initial encounter   Paroxysmal atrial fibrillation (HCC)   Chronic anticoagulation   History of pulmonary embolus (PE)   H/O: CVA (cerebrovascular accident)   NPH (normal pressure hydrocephalus) (HCC)   Hyperlipidemia   History of left breast cancer   Tobacco use disorder   Closed subcapital fracture of left femur (HCC)   Principal problem Left femoral neck fracture secondary to fall  - Patient ambulates with use of a rolling walker at baseline.  Notes tripping and falling sometime around noon on the day of admission landing on her left hip.  Denied any loss of consciousness or trauma to her head.  She was admitted to the hospital, orthopedic surgery was consulted and plan for operative repair 11/24   Active problems Paroxysmal atrial fibrillation on chronic anticoagulation -Patient appears to be in a sinus rhythm.  She had a loop recorder placed in January of this year.  Readings were noted to show concern for paroxysmal atrial fibrillation for which she was started on Eliquis in August  of this year by her cardiologist Dr. Einar Gip. CHA2DS2-VASc score = 5.  Continue heparin, hold at midnight tonight.  Resume Eliquis postop per orthopedic surgery   History of pulmonary embolism -Patient believes she suffered the pulmonary embolism around 2011.   History of CVA -Patient with prior history of strokes in 2019 and 05/2021.   Normal pressure hydrocephalus -Patient at baseline, has gait instability secondary to hydrocephalus.  CT scan of the brain noted stable dilation of the ventricles. Continue outpatient follow-up with neurology   Hyperlipidemia -Continue atorvastatin   History of breast cancer -S/p lumpectomy followed by chemotherapy and radiation therapy in 2003.   Tobacco use disorder -Nicotine patch offered  Scheduled Meds:  acetaminophen  650 mg Oral Q6H   atorvastatin  20 mg Oral Daily   methocarbamol  500 mg Oral TID   senna  1 tablet Oral QHS   Continuous Infusions:  heparin 1,400 Units/hr (04/23/22 2232)   methocarbamol (ROBAXIN) IV 500 mg (04/23/22 1654)   PRN Meds:.HYDROcodone-acetaminophen, HYDROmorphone (DILAUDID) injection, ondansetron (ZOFRAN) IV  Current Outpatient Medications  Medication Instructions   apixaban (ELIQUIS) 5 mg, Oral, 2 times daily   atorvastatin (LIPITOR) 20 mg, Oral, Daily   CALCIUM PO 1 tablet, Oral, Daily   Cholecalciferol (VITAMIN D PO) 1 tablet, Oral, Daily   Coenzyme Q10 (CO Q 10 PO) 1 tablet, Oral, Daily   furosemide (LASIX) 20 mg, Daily   Multiple Vitamin (MULTIVITAMIN WITH MINERALS) TABS tablet 1 tablet, Oral, Daily   Multiple Vitamins-Minerals (PRESERVISION AREDS 2)  CAPS Oral, 2 times daily   Omega-3 Fatty Acids (FISH OIL) 1000 MG CAPS Oral, Daily    Diet Orders (From admission, onward)     Start     Ordered   04/25/22 0001  Diet NPO time specified Except for: Sips with Meds  Diet effective midnight       Question:  Except for  Answer:  Ferrel Logan with Meds   04/23/22 1551   04/23/22 1307  Diet Heart Room service  appropriate? Yes; Fluid consistency: Thin  Diet effective now       Question Answer Comment  Room service appropriate? Yes   Fluid consistency: Thin      04/23/22 1306            DVT prophylaxis: Heparin infusion   Lab Results  Component Value Date   PLT 269 04/24/2022      Code Status: Full Code  Family Communication: Family at bedside  Status is: Inpatient  Remains inpatient appropriate because: OR tomorrow  Level of care: Telemetry Surgical  Consultants:  Orthopedic surgery   Objective: Vitals:   04/23/22 1230 04/23/22 1335 04/23/22 1911 04/24/22 0900  BP: (!) 139/51 (!) 146/49 (!) 144/52 (!) 137/48  Pulse: 72 81 76 72  Resp: '12 12 13 15  '$ Temp:  98 F (36.7 C) 97.7 F (36.5 C) 98.3 F (36.8 C)  TempSrc:  Oral Oral Oral  SpO2: 98% 96% 100% 96%  Weight:      Height:        Intake/Output Summary (Last 24 hours) at 04/24/2022 0919 Last data filed at 04/24/2022 0600 Gross per 24 hour  Intake 568.6 ml  Output 500 ml  Net 68.6 ml   Wt Readings from Last 3 Encounters:  04/22/22 72.6 kg  04/04/22 70.1 kg  02/25/22 68 kg    Examination:  Constitutional: NAD Eyes: no scleral icterus ENMT: Mucous membranes are moist.  Neck: normal, supple Respiratory: clear to auscultation bilaterally, no wheezing, no crackles. Normal respiratory effort. No accessory muscle use.  Cardiovascular: Regular rate and rhythm, no murmurs / rubs / gallops. No LE edema.  Abdomen: non distended, no tenderness. Bowel sounds positive.  Musculoskeletal: no clubbing / cyanosis.   Data Reviewed: I have independently reviewed following labs and imaging studies   CBC Recent Labs  Lab 04/22/22 1505 04/23/22 0847 04/24/22 0451  WBC 9.0 12.5* 11.1*  HGB 12.9 12.1 11.6*  HCT 39.0 36.4 35.8*  PLT 323 281 269  MCV 95.4 95.0 96.5  MCH 31.5 31.6 31.3  MCHC 33.1 33.2 32.4  RDW 13.3 13.0 13.0  LYMPHSABS 1.6  --   --   MONOABS 0.5  --   --   EOSABS 0.0  --   --   BASOSABS 0.0   --   --     Recent Labs  Lab 04/22/22 1505 04/22/22 1812  NA 139  --   K 4.1  --   CL 104  --   CO2 26  --   GLUCOSE 117*  --   BUN 15  --   CREATININE 0.48  --   CALCIUM 9.2  --   INR  --  1.1    ------------------------------------------------------------------------------------------------------------------ No results for input(s): "CHOL", "HDL", "LDLCALC", "TRIG", "CHOLHDL", "LDLDIRECT" in the last 72 hours.  No results found for: "HGBA1C" ------------------------------------------------------------------------------------------------------------------ No results for input(s): "TSH", "T4TOTAL", "T3FREE", "THYROIDAB" in the last 72 hours.  Invalid input(s): "FREET3"  Cardiac Enzymes No results for input(s): "CKMB", "TROPONINI", "MYOGLOBIN" in the  last 168 hours.  Invalid input(s): "CK" ------------------------------------------------------------------------------------------------------------------ No results found for: "BNP"  CBG: No results for input(s): "GLUCAP" in the last 168 hours.  No results found for this or any previous visit (from the past 240 hour(s)).   Radiology Studies: No results found.   Marzetta Board, MD, PhD [facility]  Between 7 am - 7 pm I am available, please contact me via Amion (for emergencies) or Securechat (non urgent messages)  Between 7 pm - 7 am I am not available, please contact night coverage MD/APP via Amion

## 2022-04-24 NOTE — Anesthesia Preprocedure Evaluation (Addendum)
Anesthesia Evaluation  Patient identified by MRN, date of birth, ID band Patient awake    Reviewed: Allergy & Precautions, H&P , NPO status , Patient's Chart, lab work & pertinent test results  Airway Mallampati: III  TM Distance: <3 FB Neck ROM: Full    Dental no notable dental hx. (+) Teeth Intact LOOKS ANTERIOR:   Pulmonary neg pulmonary ROS, COPD, Current Smoker   Pulmonary exam normal breath sounds clear to auscultation       Cardiovascular Exercise Tolerance: Good negative cardio ROS Normal cardiovascular exam+ dysrhythmias  Rhythm:Regular Rate:Normal  Echocardiogram 05/06/2021: Left ventricle cavity is normal in size and wall thickness. Normal global wall motion. Normal LV systolic function with visual EF 64%. Doppler evidence of grade I (impaired) diastolic dysfunction, normal LAP. Left atrial cavity is moderately dilated. Mild mitral valve leaflet thickening. No evidence of mitral stenosis. Mild (Grade I) mitral regurgitation. Normal right atrial pressure. Compared to previous study in 2019, moderate LA dilatation is new finding. No other significant change noted.     Neuro/Psych Carotid artery duplex 12/23/2021: Duplex suggests stenosis in the right internal carotid artery (1-15%). 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 Antegrade right vertebral artery flow. Antegrade left vertebral artery flow. Mild homogeneous plaque in bilateral carotid arteries. Compared to the study done on 06/12/2021, no significant change. Follow up in six months is appropriate if clinically indicated.   CVA negative neurological ROS  negative psych ROS   GI/Hepatic negative GI ROS, Neg liver ROS,GERD  Medicated,,  Endo/Other  negative endocrine ROS    Renal/GU negative Renal ROS  negative genitourinary   Musculoskeletal negative musculoskeletal ROS (+) Arthritis ,  Osteoarthritis,    Abdominal   Peds negative pediatric ROS (+)  Hematology negative hematology ROS (+)   Anesthesia Other Findings   Reproductive/Obstetrics negative OB ROS                             Anesthesia Physical Anesthesia Plan  ASA: 3  Anesthesia Plan: General   Post-op Pain Management: Minimal or no pain anticipated, Celebrex PO (pre-op)* and Tylenol PO (pre-op)*   Induction: Intravenous and Cricoid pressure planned  PONV Risk Score and Plan: 3 and Ondansetron, Dexamethasone and Treatment may vary due to age or medical condition  Airway Management Planned: Oral ETT  Additional Equipment: None  Intra-op Plan:   Post-operative Plan: Extubation in OR  Informed Consent: I have reviewed the patients History and Physical, chart, labs and discussed the procedure including the risks, benefits and alternatives for the proposed anesthesia with the patient or authorized representative who has indicated his/her understanding and acceptance.       Plan Discussed with: Anesthesiologist and CRNA  Anesthesia Plan Comments: (  )       Anesthesia Quick Evaluation

## 2022-04-24 NOTE — Progress Notes (Signed)
Patient ID: [name], female   DOB: Feb 16, 1946, 76 y.o.   MRN: [id number] The patient is resting this morning.  Her vital signs are stable.  Her labs are stable as well.  I did speak to family at the bedside and explained in detail what surgery involves for addressing her left displaced hip femoral neck fracture.  The risks and benefits of surgery were discussed in detail.  Surgery was thus far planned for 730 tomorrow morning.  She should be n.p.o. after midnight tonight.  Also, heparin should be held after midnight if indicated.

## 2022-04-24 NOTE — Progress Notes (Signed)
ANTICOAGULATION CONSULT NOTE   Pharmacy Consult for heparin Indication: atrial fibrillation and history  of PE  Allergies  Allergen Reactions   Sulfa Antibiotics Other (See Comments)    Childhood Allergy  05/17/18 patient denies    Patient Measurements: Height: '5\' 8"'$  (172.7 cm) Weight: 72.6 kg (160 lb) IBW/kg (Calculated) : 63.9 Heparin Dosing Weight: 72 kg  Vital Signs: Temp: 98.3 F (36.8 C) (11/23 0900) Temp Source: Oral (11/23 0900) BP: 137/48 (11/23 0900) Pulse Rate: 72 (11/23 0900)  Labs: Recent Labs    04/22/22 1505 04/22/22 1505 04/22/22 1812 04/23/22 0847 04/23/22 1112 04/23/22 2009 04/24/22 0451  HGB 12.9  --   --  12.1  --   --  11.6*  HCT 39.0  --   --  36.4  --   --  35.8*  PLT 323  --   --  281  --   --  269  APTT  --    < > 30 44* 48* 55* 90*  LABPROT  --   --  14.5  --   --   --   --   INR  --   --  1.1  --   --   --   --   HEPARINUNFRC  --   --  >1.10* 1.08*  --   --  0.98*  CREATININE 0.48  --   --   --   --   --   --    < > = values in this interval not displayed.     Estimated Creatinine Clearance: 58.5 mL/min (by C-G formula based on SCr of 0.48 mg/dL).   Assessment: Pharmacy consulted to dose heparin in patient with atrial fibrillation and history of PE.  Patient is on Eliquis prior to admission with last dose 11/21 @ 1000.  Will need to monitor aPTT until correlation with heparin levels.  aPTT supratherapeutic (135 sec) after rate increase on infusion at 1400 units/hr. No issues with line or bleeding reported per RN. Hgb down slightly to 11.6. Levels appear to be correlating but will continue with at least one more set.   Goal of Therapy:  Heparin level 0.3-0.7 units/ml aPTT 66-102 seconds Monitor platelets by anticoagulation protocol: Yes   Plan:  Hold infusion for 1 hour then restart at a rate of 1250 units/hour F/u aPTT and heparin level in 8 hours  Vicenta Dunning, PharmD  PGY1 Pharmacy Resident

## 2022-04-24 NOTE — Progress Notes (Signed)
ANTICOAGULATION CONSULT NOTE   Pharmacy Consult for heparin Indication: atrial fibrillation and history  of PE  Allergies  Allergen Reactions   Sulfa Antibiotics Other (See Comments)    Childhood Allergy  05/17/18 patient denies    Patient Measurements: Height: '5\' 8"'$  (172.7 cm) Weight: 72.6 kg (160 lb) IBW/kg (Calculated) : 63.9 Heparin Dosing Weight: 72 kg  Vital Signs: Temp: 97.7 F (36.5 C) (11/22 1911) Temp Source: Oral (11/22 1911) BP: 144/52 (11/22 1911) Pulse Rate: 76 (11/22 1911)  Labs: Recent Labs    04/22/22 1505 04/22/22 1505 04/22/22 1812 04/23/22 0847 04/23/22 1112 04/23/22 2009 04/24/22 0451  HGB 12.9  --   --  12.1  --   --  11.6*  HCT 39.0  --   --  36.4  --   --  35.8*  PLT 323  --   --  281  --   --  269  APTT  --    < > 30 44* 48* 55* 90*  LABPROT  --   --  14.5  --   --   --   --   INR  --   --  1.1  --   --   --   --   HEPARINUNFRC  --   --  >1.10* 1.08*  --   --  0.98*  CREATININE 0.48  --   --   --   --   --   --    < > = values in this interval not displayed.     Estimated Creatinine Clearance: 58.5 mL/min (by C-G formula based on SCr of 0.48 mg/dL).   Assessment: Pharmacy consulted to dose heparin in patient with atrial fibrillation and history of PE.  Patient is on Eliquis prior to admission with last dose 11/21 @ 1000.  Will need to monitor aPTT until correlation with heparin levels.  aPTT therapeutic (90 sec) after rate increase on infusion at 1400 units/hr. No issues with line or bleeding reported per RN. Hgb down slightly to 11.6  Goal of Therapy:  Heparin level 0.3-0.7 units/ml aPTT 66-102 seconds Monitor platelets by anticoagulation protocol: Yes   Plan:  Continue heparin infusion at 1400 units/hr F/u aPTT and heparin level in 8 hours  Georga Bora, PharmD Clinical Pharmacist 04/24/2022 5:49 AM Please check AMION for all [place] numbers

## 2022-04-24 NOTE — Progress Notes (Signed)
Pt had two periods on confusion. Pt ripped off all leads and clothing. I asked if she's confused at home any. She stated she's not sure.

## 2022-04-25 ENCOUNTER — Other Ambulatory Visit: Payer: Self-pay

## 2022-04-25 ENCOUNTER — Encounter (HOSPITAL_COMMUNITY): Payer: Self-pay | Admitting: Internal Medicine

## 2022-04-25 ENCOUNTER — Inpatient Hospital Stay (HOSPITAL_COMMUNITY): Payer: Medicare HMO

## 2022-04-25 ENCOUNTER — Inpatient Hospital Stay (HOSPITAL_COMMUNITY): Payer: Medicare HMO | Admitting: Anesthesiology

## 2022-04-25 ENCOUNTER — Encounter (HOSPITAL_COMMUNITY)
Admission: EM | Disposition: A | Discharge status: Skilled Nursing Facility | Payer: Self-pay | Source: Home / Self Care | Attending: Internal Medicine

## 2022-04-25 DIAGNOSIS — J449 Chronic obstructive pulmonary disease, unspecified: Secondary | ICD-10-CM

## 2022-04-25 DIAGNOSIS — M1990 Unspecified osteoarthritis, unspecified site: Secondary | ICD-10-CM

## 2022-04-25 DIAGNOSIS — S72002A Fracture of unspecified part of neck of left femur, initial encounter for closed fracture: Secondary | ICD-10-CM | POA: Diagnosis not present

## 2022-04-25 DIAGNOSIS — F17210 Nicotine dependence, cigarettes, uncomplicated: Secondary | ICD-10-CM

## 2022-04-25 DIAGNOSIS — S72012A Unspecified intracapsular fracture of left femur, initial encounter for closed fracture: Secondary | ICD-10-CM | POA: Diagnosis not present

## 2022-04-25 HISTORY — PX: TOTAL HIP ARTHROPLASTY: SHX124

## 2022-04-25 LAB — CBC
HCT: 32.1 % — ABNORMAL LOW (ref 36.0–46.0)
Hemoglobin: 10.8 g/dL — ABNORMAL LOW (ref 12.0–15.0)
MCH: 31.8 pg (ref 26.0–34.0)
MCHC: 33.6 g/dL (ref 30.0–36.0)
MCV: 94.4 fL (ref 80.0–100.0)
Platelets: 262 10*3/uL (ref 150–400)
RBC: 3.4 MIL/uL — ABNORMAL LOW (ref 3.87–5.11)
RDW: 12.8 % (ref 11.5–15.5)
WBC: 9.7 10*3/uL (ref 4.0–10.5)
nRBC: 0 % (ref 0.0–0.2)

## 2022-04-25 LAB — APTT: aPTT: 78 seconds — ABNORMAL HIGH (ref 24–36)

## 2022-04-25 LAB — TYPE AND SCREEN
ABO/RH(D): A POS
Antibody Screen: NEGATIVE

## 2022-04-25 LAB — HEPARIN LEVEL (UNFRACTIONATED)
Heparin Unfractionated: 0.65 IU/mL (ref 0.30–0.70)
Heparin Unfractionated: 0.32 IU/mL (ref 0.30–0.70)

## 2022-04-25 LAB — SURGICAL PCR SCREEN
MRSA, PCR: NEGATIVE
Staphylococcus aureus: NEGATIVE

## 2022-04-25 SURGERY — ARTHROPLASTY, HIP, TOTAL, ANTERIOR APPROACH
Anesthesia: General | Site: Hip | Laterality: Left

## 2022-04-25 MED ORDER — OXYCODONE HCL 5 MG/5ML PO SOLN: 5.0000 mg | Freq: Once | ORAL | Status: DC | PRN

## 2022-04-25 MED ORDER — ACETAMINOPHEN 325 MG PO TABS: 325.0000 mg | ORAL_TABLET | Freq: Four times a day (QID) | ORAL | Status: DC | PRN

## 2022-04-25 MED ORDER — SODIUM CHLORIDE 0.9 % IV SOLN: INTRAVENOUS | Status: DC

## 2022-04-25 MED ORDER — DIPHENHYDRAMINE HCL 12.5 MG/5ML PO ELIX: 12.5000 mg | ORAL_SOLUTION | ORAL | Status: DC | PRN

## 2022-04-25 MED ORDER — SUCCINYLCHOLINE CHLORIDE 200 MG/10ML IV SOSY
PREFILLED_SYRINGE | INTRAVENOUS | Status: AC
  Filled 2022-04-25: qty 10

## 2022-04-25 MED ORDER — LIDOCAINE 2% (20 MG/ML) 5 ML SYRINGE
INTRAMUSCULAR | Status: DC | PRN
  Administered 2022-04-25: 100 mg via INTRAVENOUS

## 2022-04-25 MED ORDER — ONDANSETRON HCL 4 MG/2ML IJ SOLN
4.0000 mg | Freq: Four times a day (QID) | INTRAMUSCULAR | Status: DC | PRN
  Administered 2022-04-28: 4 mg via INTRAVENOUS
  Filled 2022-04-25: qty 2

## 2022-04-25 MED ORDER — HYDROCODONE-ACETAMINOPHEN 7.5-325 MG PO TABS: 1.0000 | ORAL_TABLET | ORAL | Status: DC | PRN

## 2022-04-25 MED ORDER — ACETAMINOPHEN 325 MG PO TABS: 325.0000 mg | ORAL_TABLET | ORAL | Status: DC | PRN

## 2022-04-25 MED ORDER — APIXABAN 5 MG PO TABS
5.0000 mg | ORAL_TABLET | Freq: Two times a day (BID) | ORAL | Status: DC
  Administered 2022-04-26 – 2022-04-29 (×7): 5 mg via ORAL
  Filled 2022-04-25 (×8): qty 1

## 2022-04-25 MED ORDER — MEPERIDINE HCL 25 MG/ML IJ SOLN: 6.2500 mg | INTRAMUSCULAR | Status: DC | PRN

## 2022-04-25 MED ORDER — PHENYLEPHRINE HCL-NACL 20-0.9 MG/250ML-% IV SOLN
INTRAVENOUS | Status: DC | PRN
  Administered 2022-04-25: 40 ug/min via INTRAVENOUS

## 2022-04-25 MED ORDER — PHENYLEPHRINE 80 MCG/ML (10ML) SYRINGE FOR IV PUSH (FOR BLOOD PRESSURE SUPPORT)
PREFILLED_SYRINGE | INTRAVENOUS | Status: AC
  Filled 2022-04-25: qty 10

## 2022-04-25 MED ORDER — METHOCARBAMOL 1000 MG/10ML IJ SOLN: 500.0000 mg | Freq: Four times a day (QID) | INTRAVENOUS | Status: DC | PRN

## 2022-04-25 MED ORDER — ONDANSETRON HCL 4 MG PO TABS: 4.0000 mg | ORAL_TABLET | Freq: Four times a day (QID) | ORAL | Status: DC | PRN

## 2022-04-25 MED ORDER — PROPOFOL 10 MG/ML IV BOLUS
INTRAVENOUS | Status: DC | PRN
  Administered 2022-04-25: 100 mg via INTRAVENOUS

## 2022-04-25 MED ORDER — PHENYLEPHRINE HCL (PRESSORS) 10 MG/ML IV SOLN
INTRAVENOUS | Status: DC | PRN
  Administered 2022-04-25: 120 ug via INTRAVENOUS
  Administered 2022-04-25: 160 ug via INTRAVENOUS

## 2022-04-25 MED ORDER — DEXAMETHASONE SODIUM PHOSPHATE 10 MG/ML IJ SOLN
INTRAMUSCULAR | Status: DC | PRN
  Administered 2022-04-25: 8 mg via INTRAVENOUS

## 2022-04-25 MED ORDER — ONDANSETRON HCL 4 MG/2ML IJ SOLN
INTRAMUSCULAR | Status: AC
  Filled 2022-04-25: qty 2

## 2022-04-25 MED ORDER — SUCCINYLCHOLINE CHLORIDE 200 MG/10ML IV SOSY
PREFILLED_SYRINGE | INTRAVENOUS | Status: DC | PRN
  Administered 2022-04-25: 120 mg via INTRAVENOUS

## 2022-04-25 MED ORDER — ROCURONIUM BROMIDE 10 MG/ML (PF) SYRINGE
PREFILLED_SYRINGE | INTRAVENOUS | Status: AC
  Filled 2022-04-25: qty 10

## 2022-04-25 MED ORDER — CEFAZOLIN SODIUM-DEXTROSE 2-3 GM-%(50ML) IV SOLR
INTRAVENOUS | Status: DC | PRN
  Administered 2022-04-25: 2 g via INTRAVENOUS

## 2022-04-25 MED ORDER — METOCLOPRAMIDE HCL 5 MG PO TABS: 5.0000 mg | ORAL_TABLET | Freq: Three times a day (TID) | ORAL | Status: DC | PRN

## 2022-04-25 MED ORDER — PHENOL 1.4 % MT LIQD: 1.0000 | OROMUCOSAL | Status: DC | PRN

## 2022-04-25 MED ORDER — MENTHOL 3 MG MT LOZG: 1.0000 | LOZENGE | OROMUCOSAL | Status: DC | PRN

## 2022-04-25 MED ORDER — LACTATED RINGERS IV SOLN: INTRAVENOUS | Status: DC | PRN

## 2022-04-25 MED ORDER — TRANEXAMIC ACID-NACL 1000-0.7 MG/100ML-% IV SOLN
INTRAVENOUS | Status: DC | PRN
  Administered 2022-04-25: 1000 mg via INTRAVENOUS

## 2022-04-25 MED ORDER — SUGAMMADEX SODIUM 200 MG/2ML IV SOLN
INTRAVENOUS | Status: DC | PRN
  Administered 2022-04-25: 200 mg via INTRAVENOUS

## 2022-04-25 MED ORDER — TRANEXAMIC ACID-NACL 1000-0.7 MG/100ML-% IV SOLN
INTRAVENOUS | Status: AC
  Filled 2022-04-25: qty 100

## 2022-04-25 MED ORDER — FENTANYL CITRATE (PF) 250 MCG/5ML IJ SOLN
INTRAMUSCULAR | Status: DC | PRN
  Administered 2022-04-25 (×2): 25 ug via INTRAVENOUS

## 2022-04-25 MED ORDER — OXYCODONE HCL 5 MG PO TABS: 5.0000 mg | ORAL_TABLET | Freq: Once | ORAL | Status: DC | PRN

## 2022-04-25 MED ORDER — FENTANYL CITRATE (PF) 100 MCG/2ML IJ SOLN: 25.0000 ug | INTRAMUSCULAR | Status: DC | PRN

## 2022-04-25 MED ORDER — FENTANYL CITRATE (PF) 250 MCG/5ML IJ SOLN
INTRAMUSCULAR | Status: AC
  Filled 2022-04-25: qty 5

## 2022-04-25 MED ORDER — SODIUM CHLORIDE 0.9 % IR SOLN
Status: DC | PRN
  Administered 2022-04-25: 1000 mL

## 2022-04-25 MED ORDER — DEXAMETHASONE SODIUM PHOSPHATE 10 MG/ML IJ SOLN
INTRAMUSCULAR | Status: AC
  Filled 2022-04-25: qty 1

## 2022-04-25 MED ORDER — CHLORHEXIDINE GLUCONATE 0.12 % MT SOLN
OROMUCOSAL | Status: AC
  Filled 2022-04-25: qty 15

## 2022-04-25 MED ORDER — METOCLOPRAMIDE HCL 5 MG/ML IJ SOLN: 5.0000 mg | Freq: Three times a day (TID) | INTRAMUSCULAR | Status: DC | PRN

## 2022-04-25 MED ORDER — ONDANSETRON HCL 4 MG/2ML IJ SOLN: 4.0000 mg | Freq: Once | INTRAMUSCULAR | Status: DC | PRN

## 2022-04-25 MED ORDER — DOCUSATE SODIUM 100 MG PO CAPS
100.0000 mg | ORAL_CAPSULE | Freq: Two times a day (BID) | ORAL | Status: DC
  Administered 2022-04-25 – 2022-04-27 (×4): 100 mg via ORAL
  Filled 2022-04-25 (×5): qty 1

## 2022-04-25 MED ORDER — 0.9 % SODIUM CHLORIDE (POUR BTL) OPTIME
TOPICAL | Status: DC | PRN
  Administered 2022-04-25: 1000 mL

## 2022-04-25 MED ORDER — CEFAZOLIN SODIUM-DEXTROSE 1-4 GM/50ML-% IV SOLN
1.0000 g | Freq: Four times a day (QID) | INTRAVENOUS | Status: AC
  Administered 2022-04-25 (×2): 1 g via INTRAVENOUS
  Filled 2022-04-25 (×2): qty 50

## 2022-04-25 MED ORDER — METHOCARBAMOL 500 MG PO TABS
500.0000 mg | ORAL_TABLET | Freq: Four times a day (QID) | ORAL | Status: DC | PRN
  Administered 2022-04-25: 500 mg via ORAL

## 2022-04-25 MED ORDER — MORPHINE SULFATE (PF) 2 MG/ML IV SOLN: 0.5000 mg | INTRAVENOUS | Status: DC | PRN

## 2022-04-25 MED ORDER — ROCURONIUM BROMIDE 10 MG/ML (PF) SYRINGE
PREFILLED_SYRINGE | INTRAVENOUS | Status: DC | PRN
  Administered 2022-04-25: 40 mg via INTRAVENOUS

## 2022-04-25 MED ORDER — ONDANSETRON HCL 4 MG/2ML IJ SOLN
INTRAMUSCULAR | Status: DC | PRN
  Administered 2022-04-25: 4 mg via INTRAVENOUS

## 2022-04-25 MED ORDER — CEFAZOLIN SODIUM-DEXTROSE 2-4 GM/100ML-% IV SOLN
INTRAVENOUS | Status: AC
  Filled 2022-04-25: qty 100

## 2022-04-25 MED ORDER — PROPOFOL 10 MG/ML IV BOLUS
INTRAVENOUS | Status: AC
  Filled 2022-04-25: qty 20

## 2022-04-25 MED ORDER — HYDROCODONE-ACETAMINOPHEN 5-325 MG PO TABS: 1.0000 | ORAL_TABLET | ORAL | Status: DC | PRN

## 2022-04-25 MED ORDER — ACETAMINOPHEN 160 MG/5ML PO SOLN: 325.0000 mg | ORAL | Status: DC | PRN

## 2022-04-25 SURGICAL SUPPLY — 56 items
APL SKNCLS STERI-STRIP NONHPOA (GAUZE/BANDAGES/DRESSINGS) ×1
BAG COUNTER SPONGE SURGICOUNT (BAG) ×1 IMPLANT
BAG SPNG CNTER NS LX DISP (BAG) ×1
BENZOIN TINCTURE PRP APPL 2/3 (GAUZE/BANDAGES/DRESSINGS) ×1 IMPLANT
BLADE CLIPPER SURG (BLADE) IMPLANT
BLADE SAW SGTL 18X1.27X75 (BLADE) ×1 IMPLANT
COVER SURGICAL LIGHT HANDLE (MISCELLANEOUS) ×1 IMPLANT
CUP SECTOR GRIPTON 50MM (Cup) IMPLANT
DRAPE C-ARM 42X72 X-RAY (DRAPES) ×1 IMPLANT
DRAPE STERI IOBAN 125X83 (DRAPES) ×1 IMPLANT
DRAPE U-SHAPE 47X51 STRL (DRAPES) ×3 IMPLANT
DRSG AQUACEL AG ADV 3.5X10 (GAUZE/BANDAGES/DRESSINGS) ×1 IMPLANT
DURAPREP 26ML APPLICATOR (WOUND CARE) ×1 IMPLANT
ELECT BLADE 4.0 EZ CLEAN MEGAD (MISCELLANEOUS) ×1
ELECT BLADE 6.5 EXT (BLADE) IMPLANT
ELECT REM PT RETURN 9FT ADLT (ELECTROSURGICAL) ×1
ELECTRODE BLDE 4.0 EZ CLN MEGD (MISCELLANEOUS) ×1 IMPLANT
ELECTRODE REM PT RTRN 9FT ADLT (ELECTROSURGICAL) ×1 IMPLANT
FACESHIELD WRAPAROUND (MASK) ×3 IMPLANT
FACESHIELD WRAPAROUND OR TEAM (MASK) ×2 IMPLANT
FEM STEM 12/14 TAPER SZ 4 HIP (Orthopedic Implant) ×1 IMPLANT
FEMORAL STEM 12/14 TPR SZ4 HIP (Orthopedic Implant) IMPLANT
GLOVE BIOGEL PI IND STRL 8 (GLOVE) ×2 IMPLANT
GLOVE ECLIPSE 8.0 STRL XLNG CF (GLOVE) ×1 IMPLANT
GLOVE ORTHO TXT STRL SZ7.5 (GLOVE) ×2 IMPLANT
GOWN STRL REUS W/ TWL LRG LVL3 (GOWN DISPOSABLE) ×2 IMPLANT
GOWN STRL REUS W/ TWL XL LVL3 (GOWN DISPOSABLE) ×2 IMPLANT
GOWN STRL REUS W/TWL LRG LVL3 (GOWN DISPOSABLE) ×2
GOWN STRL REUS W/TWL XL LVL3 (GOWN DISPOSABLE) ×2
HANDPIECE INTERPULSE COAX TIP (DISPOSABLE) ×1
HEAD FEM STD 32X+5 STRL (Hips) IMPLANT
KIT BASIN OR (CUSTOM PROCEDURE TRAY) ×1 IMPLANT
KIT TURNOVER KIT B (KITS) ×1 IMPLANT
LINER ACET PNNCL PLUS4 NEUTRAL (Hips) IMPLANT
MANIFOLD NEPTUNE II (INSTRUMENTS) ×1 IMPLANT
NS IRRIG 1000ML POUR BTL (IV SOLUTION) ×1 IMPLANT
PACK TOTAL JOINT (CUSTOM PROCEDURE TRAY) ×1 IMPLANT
PAD ARMBOARD 7.5X6 YLW CONV (MISCELLANEOUS) ×1 IMPLANT
PINNACLE PLUS 4 NEUTRAL (Hips) ×1 IMPLANT
SET HNDPC FAN SPRY TIP SCT (DISPOSABLE) ×1 IMPLANT
STAPLER VISISTAT 35W (STAPLE) IMPLANT
STRIP CLOSURE SKIN 1/2X4 (GAUZE/BANDAGES/DRESSINGS) ×2 IMPLANT
SUT ETHIBOND NAB CT1 #1 30IN (SUTURE) ×1 IMPLANT
SUT MNCRL AB 4-0 PS2 18 (SUTURE) IMPLANT
SUT VIC AB 0 CT1 27 (SUTURE) ×1
SUT VIC AB 0 CT1 27XBRD ANBCTR (SUTURE) ×1 IMPLANT
SUT VIC AB 1 CT1 27 (SUTURE) ×1
SUT VIC AB 1 CT1 27XBRD ANBCTR (SUTURE) ×1 IMPLANT
SUT VIC AB 2-0 CT1 27 (SUTURE) ×2
SUT VIC AB 2-0 CT1 TAPERPNT 27 (SUTURE) ×1 IMPLANT
TOWEL GREEN STERILE (TOWEL DISPOSABLE) ×1 IMPLANT
TOWEL GREEN STERILE FF (TOWEL DISPOSABLE) ×1 IMPLANT
TRAY CATH 16FR W/PLASTIC CATH (SET/KITS/TRAYS/PACK) IMPLANT
TRAY FOLEY W/BAG SLVR 16FR (SET/KITS/TRAYS/PACK) ×1
TRAY FOLEY W/BAG SLVR 16FR ST (SET/KITS/TRAYS/PACK) IMPLANT
WATER STERILE IRR 1000ML POUR (IV SOLUTION) ×2 IMPLANT

## 2022-04-25 NOTE — Evaluation (Signed)
Physical Therapy Evaluation Patient Details Name: [name] MRN: [id number] DOB: 02-Mar-1946 Today's Date: 04/25/2022  History of Present Illness  Pt is 76 yo female who presents after a fall at home on 04/22/22 with a displaced femoral neck fx. Pt underwent L THA. PMH: PE, TIA, cerebellar CVA, NPH, chronic back pain due to spina bifida with gait instability, COPD, Afib, smoker  Clinical Impression  Pt admitted with above diagnosis. Pt from home where she was ambulatory with RW in home and [facility] outside. Has had falls before. Husband home with her 24/7. Pt needed max A for mobility today and was unable to attempt standing due to posterior lean in sitting and pain. Recommending AIR at this time and will progress mobility as pt tolerates.  Pt currently with functional limitations due to the deficits listed below (see PT Problem List). Pt will benefit from skilled PT to increase their independence and safety with mobility to allow discharge to the venue listed below.          Recommendations for follow up therapy are one component of a multi-disciplinary discharge planning process, led by the attending physician.  Recommendations may be updated based on patient status, additional functional criteria and insurance authorization.  Follow Up Recommendations Acute inpatient rehab (3hours/day)      Assistance Recommended at Discharge Frequent or constant Supervision/Assistance  Patient can return home with the following  Two people to help with walking and/or transfers;Two people to help with bathing/dressing/bathroom;Assistance with cooking/housework;Assist for transportation;Help with stairs or ramp for entrance    Equipment Recommendations Other (comment) (TBD)  Recommendations for Other Services  Rehab consult;OT consult    Functional Status Assessment Patient has had a recent decline in their functional status and demonstrates the ability to make significant improvements in function in a  reasonable and predictable amount of time.     Precautions / Restrictions Precautions Precautions: Fall Precaution Comments: history of falls but none recently Restrictions Weight Bearing Restrictions: Yes LLE Weight Bearing: Weight bearing as tolerated      Mobility  Bed Mobility Overal bed mobility: Needs Assistance Bed Mobility: Supine to Sit, Sit to Supine     Supine to sit: Max assist, HOB elevated Sit to supine: Max assist, +2 for physical assistance   General bed mobility comments: pt unable to move LE's on her own at all and had difficulty managing upper body as well. Did grasp L rail with R hand but otherwise dependent with bed mobility.    Transfers                   General transfer comment: unable to attempt standing today as pt with strong posterior lean    Ambulation/Gait               General Gait Details: unable  Stairs            Wheelchair Mobility    Modified Rankin (Stroke Patients Only)       Balance Overall balance assessment: Needs assistance, History of Falls Sitting-balance support: Bilateral upper extremity supported, Feet supported Sitting balance-Leahy Scale: Poor Sitting balance - Comments: needed mod A to sit initially, progressed to min A. Limited by posterior lean Postural control: Posterior lean     Standing balance comment: unable                             Pertinent Vitals/Pain Pain Assessment Pain Assessment: Faces Faces Pain  Scale: Hurts whole lot Pain Location: L hip Pain Descriptors / Indicators: Sore, Tightness Pain Intervention(s): Limited activity within patient's tolerance, Monitored during session    Home Living Family/patient expects to be discharged to:: Private residence Living Arrangements: Spouse/significant other Available Help at Discharge: Family;Available 24 hours/day Type of Home: House Home Access: Stairs to enter Entrance Stairs-Rails: Left Entrance Stairs-Number  of Steps: 4 Alternate Level Stairs-Number of Steps: flight Home Layout: Two level;Laundry or work area in [place]: Conservation officer, nature (2 wheels);Cane - quad Additional Comments: pt spends most of the day in the basement because her computer and TV are down there and her husband doesn't allow her to smoke upstairs as he does not smoke. He reports that she smokes a pack a day    Prior Function Prior Level of Function : Needs assist             Mobility Comments: ambulates in house with RW and outside with [facility]. Does not drive, husband drives ADLs Comments: can't get socks on so wears slip on shoes, gets hair done, washes off in shower, husband helps in kitchen. They get take out, no cooking     Hand Dominance   Dominant Hand: Right    Extremity/Trunk Assessment   Upper Extremity Assessment Upper Extremity Assessment: Generalized weakness;Defer to OT evaluation    Lower Extremity Assessment Lower Extremity Assessment: Generalized weakness;LLE deficits/detail LLE Deficits / Details: hip flex 1/5, knee ext 1/5 LLE: Unable to fully assess due to pain LLE Sensation: WNL LLE Coordination: decreased gross motor    Cervical / Trunk Assessment Cervical / Trunk Assessment: Kyphotic  Communication   Communication: No difficulties  Cognition Arousal/Alertness: Awake/alert Behavior During Therapy: WFL for tasks assessed/performed Overall Cognitive Status: Within Functional Limits for tasks assessed                                          General Comments General comments (skin integrity, edema, etc.): SPO2 dropped to 85% on RA, returned to 90's on 2L O2.    Exercises Total Joint Exercises Ankle Circles/Pumps: PROM, Left, 5 reps, Supine Quad Sets: AROM, Both, 5 reps, Supine Long Arc Quad: AAROM, Left, 5 reps, Seated   Assessment/Plan    PT Assessment Patient needs continued PT services  PT Problem List Decreased strength;Decreased range of  motion;Decreased activity tolerance;Decreased balance;Decreased mobility;Decreased coordination;Decreased safety awareness;Decreased knowledge of precautions;Pain;Cardiopulmonary status limiting activity       PT Treatment Interventions Gait training;DME instruction;Functional mobility training;Therapeutic activities;Therapeutic exercise;Balance training;Neuromuscular re-education;Patient/family education    PT Goals (Current goals can be found in the Care Plan section)  Acute Rehab PT Goals Patient Stated Goal: rehab and then home PT Goal Formulation: With patient/family Time For Goal Achievement: 05/09/22 Potential to Achieve Goals: Good    Frequency Min 3X/week     Co-evaluation               AM-PAC PT "6 Clicks" Mobility  Outcome Measure Help needed turning from your back to your side while in a flat bed without using bedrails?: Total Help needed moving from lying on your back to sitting on the side of a flat bed without using bedrails?: Total Help needed moving to and from a bed to a chair (including a wheelchair)?: Total Help needed standing up from a chair using your arms (e.g., wheelchair or bedside chair)?: Total Help needed to walk in  hospital room?: Total Help needed climbing 3-5 steps with a railing? : Total 6 Click Score: 6    End of Session Equipment Utilized During Treatment: Oxygen Activity Tolerance: Patient limited by pain Patient left: in bed;with call bell/phone within reach;with family/visitor present Nurse Communication: Mobility status PT Visit Diagnosis: Unsteadiness on feet (R26.81);Repeated falls (R29.6);Muscle weakness (generalized) (M62.81);Difficulty in walking, not elsewhere classified (R26.2);Pain Pain - Right/Left: Left Pain - part of body: Hip    Time: 3312-5087 PT Time Calculation (min) (ACUTE ONLY): 36 min   Charges:   PT Evaluation $PT Eval Moderate Complexity: 1 Mod PT Treatments $Therapeutic Activity: 8-22 mins         Leighton Roach, PT  Acute Rehab Services Secure chat preferred Office [place] 04/25/2022, 4:47 PM

## 2022-04-25 NOTE — Progress Notes (Signed)
Patient ID: [name], female   DOB: 09/26/1945, 76 y.o.   MRN: [id number] I seen the patient in the short stay area.  I spoke with her but also spoke with family.  Her vital signs are stable.  It is understood that we are proceeding with surgery today for right total hip arthroplasty to treat her displaced unstable right hip fracture.  The goal is hopefully being able to set her up, take steps, improve her mobility and decrease her pain.  The risks and benefits of surgery been explained in detail and informed consent was obtained.  The right operative hip has been marked.

## 2022-04-25 NOTE — TOC CAGE-AID Note (Signed)
Transition of Care [facility]) - CAGE-AID Screening   Patient Details  Name: [name] MRN: [id number] Date of Birth: [birth year]  [facility] CM/SW Contact:    Army Melia, RN Phone Number:[phone] 04/25/2022, 4:39 AM   Clinical Narrative:  No hx of alcohol/drug use.  CAGE-AID Screening:    Have You Ever Felt You Ought to Cut Down on Your Drinking or Drug Use?: No Have People Annoyed You By Critizing Your Drinking Or Drug Use?: No Have You Felt Bad Or Guilty About Your Drinking Or Drug Use?: No Have You Ever Had a Drink or Used Drugs First Thing In The Morning to Steady Your Nerves or to Get Rid of a Hangover?: No CAGE-AID Score: 0  Substance Abuse Education Offered: No

## 2022-04-25 NOTE — Transfer of Care (Signed)
Immediate Anesthesia Transfer of Care Note  Patient: [name]  Procedure(s) Performed: TOTAL HIP ARTHROPLASTY ANTERIOR APPROACH (Left: Hip)  Patient Location: PACU  Anesthesia Type:General  Level of Consciousness: drowsy  Airway & Oxygen Therapy: Patient Spontanous Breathing and Patient connected to face mask oxygen  Post-op Assessment: Report given to RN, Post -op Vital signs reviewed and stable, and Patient moving all extremities X 4  Post vital signs: Reviewed and stable  Last Vitals:  Vitals Value Taken Time  BP 138/54   Temp    Pulse 64 04/25/22 0912  Resp 16 04/25/22 0912  SpO2 99 % 04/25/22 0912  Vitals shown include unvalidated device data.  Last Pain:  Vitals:   04/25/22 0600  TempSrc: Oral  PainSc:          Complications:  Encounter Notable Events  Notable Event Outcome Phase Comment  Difficult to intubate - expected  Intraprocedure Filed from anesthesia note documentation.

## 2022-04-25 NOTE — Progress Notes (Signed)
Inpatient Rehab Admissions Coordinator:    CIR consulted. Pt. To OR today. Will follow up once PT/OT has seen Pt. And make their recommendations. Note that Pt.'s insurance is unlikely to approve CIR after a THA.  Clemens Catholic, [place], [place] Admissions Coordinator  [phone] ([place]) [phone] (office)

## 2022-04-25 NOTE — Anesthesia Postprocedure Evaluation (Signed)
Anesthesia Post Note  Patient: [name]  Procedure(s) Performed: TOTAL HIP ARTHROPLASTY ANTERIOR APPROACH (Left: Hip)     Patient location during evaluation: PACU Anesthesia Type: General Level of consciousness: awake and alert Pain management: pain level controlled Vital Signs Assessment: post-procedure vital signs reviewed and stable Respiratory status: spontaneous breathing, nonlabored ventilation, respiratory function stable and patient connected to nasal cannula oxygen Cardiovascular status: blood pressure returned to baseline and stable Postop Assessment: no apparent nausea or vomiting Anesthetic complications: yes  Encounter Notable Events  Notable Event Outcome Phase Comment  Difficult to intubate - expected  Intraprocedure Filed from anesthesia note documentation.    Last Vitals:  Vitals:   04/25/22 1015 04/25/22 1030  BP: (!) 122/49 (!) 127/46  Pulse: 72   Resp: 17   Temp: (!) 36.1 C   SpO2: 97%     Last Pain:  Vitals:   04/25/22 1030  TempSrc:   PainSc: 3                  Bradlee Bridgers

## 2022-04-25 NOTE — Progress Notes (Signed)
PROGRESS NOTE  [name] YTK:[id number] DOB: 26-Feb-1946 DOA: 04/22/2022 PCP: [place], MD   LOS: 3 days   Brief Narrative / Interim history: 76 year old female with a history of atrial fibrillation on anticoagulation, NPH, presents to the hospital after a fall at home.  Patient was making a turn in her kitchen when she lost her balance and fell.  She was found to have a left femoral neck fracture.  Case was discussed with orthopedics in [place].  She is being transferred to [facility] for further operative management.  Her Eliquis has been held in preparation for surgery and she has been started on heparin infusion as a bridge.  Heart rate is currently stable.   Subjective / 24h Interval events: Awaiting surgery.  Intermittent confusion overnight  Assesement and Plan: Principal Problem:   Left displaced femoral neck fracture (HCC) Active Problems:   Fall at home, initial encounter   Paroxysmal atrial fibrillation (HCC)   Chronic anticoagulation   History of pulmonary embolus (PE)   H/O: CVA (cerebrovascular accident)   NPH (normal pressure hydrocephalus) (HCC)   Hyperlipidemia   History of left breast cancer   Tobacco use disorder   Closed subcapital fracture of left femur (HCC)   Principal problem Left femoral neck fracture secondary to fall  - Patient ambulates with use of a rolling walker at baseline.  Notes tripping and falling sometime around noon on the day of admission landing on her left hip.  Denied any loss of consciousness or trauma to her head.  She was admitted to the hospital, orthopedic surgery was consulted, plan for operative repair this morning   Active problems Paroxysmal atrial fibrillation on chronic anticoagulation -Patient appears to be in a sinus rhythm.  She had a loop recorder placed in January of this year.  Readings were noted to show concern for paroxysmal atrial fibrillation for which she was started on Eliquis in August of this year by  her cardiologist Dr. Einar Gip. CHA2DS2-VASc score = 5.  Anticoagulation now on hold perioperatively, resume Eliquis when cleared by surgery   History of pulmonary embolism -Patient believes she suffered the pulmonary embolism around 2011.   History of CVA -Patient with prior history of strokes in 2019 and 05/2021.   Normal pressure hydrocephalus -Patient at baseline, has gait instability secondary to hydrocephalus.  CT scan of the brain noted stable dilation of the ventricles. Continue outpatient follow-up with neurology   Hyperlipidemia -Continue atorvastatin   History of breast cancer -S/p lumpectomy followed by chemotherapy and radiation therapy in 2003.   Tobacco use disorder -Nicotine patch offered  Scheduled Meds:  [MAR Hold] acetaminophen  650 mg Oral Q6H   [MAR Hold] atorvastatin  20 mg Oral Daily   [MAR Hold] methocarbamol  500 mg Oral TID   [MAR Hold] senna  1 tablet Oral QHS   Continuous Infusions:  sodium chloride 75 mL/hr at 04/24/22 1549   ceFAZolin     [MAR Hold] methocarbamol (ROBAXIN) IV 500 mg (04/23/22 1654)   PRN Meds:.acetaminophen **OR** acetaminophen (TYLENOL) oral liquid 160 mg/5 mL, [MAR Hold] alum & mag hydroxide-simeth, [MAR Hold] calcium carbonate, ceFAZolin, fentaNYL (SUBLIMAZE) injection, [MAR Hold] HYDROcodone-acetaminophen, [MAR Hold]  HYDROmorphone (DILAUDID) injection, meperidine (DEMEROL) injection, [MAR Hold] ondansetron (ZOFRAN) IV, ondansetron (ZOFRAN) IV, oxyCODONE **OR** oxyCODONE  Current Outpatient Medications  Medication Instructions   apixaban (ELIQUIS) 5 mg, Oral, 2 times daily   atorvastatin (LIPITOR) 20 mg, Oral, Daily   CALCIUM PO 1 tablet, Oral, Daily   Cholecalciferol (  VITAMIN D PO) 1 tablet, Oral, Daily   Coenzyme Q10 (CO Q 10 PO) 1 tablet, Oral, Daily   furosemide (LASIX) 20 mg, Daily   Multiple Vitamin (MULTIVITAMIN WITH MINERALS) TABS tablet 1 tablet, Oral, Daily   Multiple Vitamins-Minerals (PRESERVISION AREDS 2) CAPS Oral, 2  times daily   Omega-3 Fatty Acids (FISH OIL) 1000 MG CAPS Oral, Daily    Diet Orders (From admission, onward)     Start     Ordered   04/25/22 0001  Diet NPO time specified Except for: Sips with Meds  Diet effective midnight       Question:  Except for  Answer:  Sips with Meds   04/23/22 1551            DVT prophylaxis: Heparin infusion   Lab Results  Component Value Date   PLT 262 04/25/2022      Code Status: Full Code  Family Communication: Family at bedside  Status is: Inpatient  Remains inpatient appropriate because: Or this morning  Level of care: Telemetry Surgical  Consultants:  Orthopedic surgery   Objective: Vitals:   04/25/22 0930 04/25/22 0945 04/25/22 1000 04/25/22 1015  BP: (!) 139/54 (!) 134/48 (!) 138/49 (!) 122/49  Pulse: 68 68 68 72  Resp: '17 16 18 17  '$ Temp:    (!) 97 F (36.1 C)  TempSrc:      SpO2: 93% 95% 97% 97%  Weight:      Height:        Intake/Output Summary (Last 24 hours) at 04/25/2022 1020 Last data filed at 04/25/2022 1015 Gross per 24 hour  Intake 1100 ml  Output 1000 ml  Net 100 ml    Wt Readings from Last 3 Encounters:  04/22/22 72.6 kg  04/04/22 70.1 kg  02/25/22 68 kg    Examination:  Constitutional: NAD Eyes: lids and conjunctivae normal, no scleral icterus ENMT: mmm Neck: normal, supple Respiratory: clear to auscultation bilaterally, no wheezing, no crackles. Normal respiratory effort.  Cardiovascular: Regular rate and rhythm, no murmurs / rubs / gallops. No LE edema. Abdomen: soft, no distention, no tenderness. Bowel sounds positive.  Skin: no rashes Neurologic: no focal deficits, equal strength  Data Reviewed: I have independently reviewed following labs and imaging studies   CBC Recent Labs  Lab 04/22/22 1505 04/23/22 0847 04/24/22 0451 04/25/22 0036  WBC 9.0 12.5* 11.1* 9.7  HGB 12.9 12.1 11.6* 10.8*  HCT 39.0 36.4 35.8* 32.1*  PLT 323 281 269 262  MCV 95.4 95.0 96.5 94.4  MCH 31.5 31.6  31.3 31.8  MCHC 33.1 33.2 32.4 33.6  RDW 13.3 13.0 13.0 12.8  LYMPHSABS 1.6  --   --   --   MONOABS 0.5  --   --   --   EOSABS 0.0  --   --   --   BASOSABS 0.0  --   --   --      Recent Labs  Lab 04/22/22 1505 04/22/22 1812  NA 139  --   K 4.1  --   CL 104  --   CO2 26  --   GLUCOSE 117*  --   BUN 15  --   CREATININE 0.48  --   CALCIUM 9.2  --   INR  --  1.1     ------------------------------------------------------------------------------------------------------------------ No results for input(s): "CHOL", "HDL", "LDLCALC", "TRIG", "CHOLHDL", "LDLDIRECT" in the last 72 hours.  No results found for: "HGBA1C" ------------------------------------------------------------------------------------------------------------------ No results for input(s): "TSH", "T4TOTAL", "T3FREE", "  THYROIDAB" in the last 72 hours.  Invalid input(s): "FREET3"  Cardiac Enzymes No results for input(s): "CKMB", "TROPONINI", "MYOGLOBIN" in the last 168 hours.  Invalid input(s): "CK" ------------------------------------------------------------------------------------------------------------------ No results found for: "BNP"  CBG: No results for input(s): "GLUCAP" in the last 168 hours.  Recent Results (from the past 240 hour(s))  Surgical pcr screen     Status: None   Collection Time: 04/25/22  4:32 AM   Specimen: Nasal Mucosa; Nasal Swab  Result Value Ref Range Status   MRSA, PCR NEGATIVE NEGATIVE Final   Staphylococcus aureus NEGATIVE NEGATIVE Final    Comment: (NOTE) The Xpert SA Assay (FDA approved for NASAL specimens in patients 35 years of age and older), is one component of a comprehensive surveillance program. It is not intended to diagnose infection nor to guide or monitor treatment. Performed at [facility], [place] [address] [place], [place] [address]      Radiology Studies: DG Pelvis Portable  Result Date: 04/25/2022 CLINICAL DATA:  LEFT total hip arthroplasty  EXAM: PORTABLE PELVIS 1-2 VIEWS COMPARISON:  Recent radiographs FINDINGS: LEFT total hip arthroplasty identified without complicating features on this single view. No acute fracture or dislocation identified. IMPRESSION: LEFT total hip arthroplasty without complicating features. Electronically Signed   By: Margarette Canada M.D.   On: 04/25/2022 10:01   DG HIP UNILAT WITH PELVIS 1V LEFT  Result Date: 04/25/2022 CLINICAL DATA:  Elective surgery.  Hip arthroplasty. EXAM: DG HIP (WITH OR WITHOUT PELVIS) 1V*L* COMPARISON:  Left hip radiographs 04/22/2022 FLUOROSCOPY: Radiation Exposure Index (as provided by the fluoroscopic device): 1.6 mGy Kerma FINDINGS: Three intraoperative spot fluoroscopic images are provided during performance of a left total hip arthroplasty. The prosthetic components appear normally positioned on these limited images, and no gross fracture is identified. IMPRESSION: Intraoperative images during left total hip arthroplasty. Electronically Signed   By: Logan Bores M.D.   On: 04/25/2022 08:55   DG C-Arm 1-60 Min-No Report  Result Date: 04/25/2022 Fluoroscopy was utilized by the requesting physician.  No radiographic interpretation.     Marzetta Board, MD, PhD [facility]  Between 7 am - 7 pm I am available, please contact me via Amion (for emergencies) or Securechat (non urgent messages)  Between 7 pm - 7 am I am not available, please contact night coverage MD/APP via Amion

## 2022-04-25 NOTE — Op Note (Signed)
Operative Note  Date of operation: 04/25/2022 Preoperative diagnosis: Left displaced subcapital femoral neck fracture Postoperative diagnosis: Same  Procedure: Left direct anterior total hip arthroplasty  Implants: DePuy sector GRIPTION acetabular opponent size 50, 32+4 polythene liner, size 4 Actis femoral component with high offset, 32+5 metal head ball  Surgeon: Lind Guest. Ninfa Linden, MD Assistant: Benita Stabile, PA-C  Anesthesia: General EBL: 100 cc Antibiotics: 2 g IV Ancef Complications: None  Indications: The patient is a 76 year old female who sustained mechanical fall on Tuesday of this week injuring her left hip.  She was seen in outlying hospital and found to have a displaced femoral neck fracture.  She is on Eliquis chronically and she did take a dose on Tuesday morning.  It was prudent to then have her transferred to [facility] for definitive surgery today to address her left hip displaced subcapital femoral neck fracture.  I talked to her and family in length about the surgery.  We discussed operative and nonoperative treatment modalities.  The patient does ambulate and is in significant pain.  The standard of care is usually proceeding with some type of hip arthroplasty.  I talked about total hip arthroplasty versus hemiarthroplasty and felt in my hands a total hip would be more prudent given the fact that she is still quite active and at 76 years old.  We discussed the risk of acute blood loss anemia, nerve vessel injury, fracture, infection, dislocation, DVT, implant failure, leg length differences and wound healing issues.  We talked about her goals being decreased pain, improve mobility, and improve quality of life.  Procedure description: After informed consent was obtained and the appropriate left hip was marked, the patient was brought to the operating room and general anesthesia was obtained while she is on her stretcher.  She was placed supine on the Hana fracture table  with a perineal post in place in both legs and inline skeletal traction devices but no traction applied.  Her left operative hip was prepped and draped with DuraPrep and sterile drapes.  A timeout was called and she identified as correct patient correct left hip.  An incision was then made just inferior and posterior to the ASIS and carried slightly obliquely down the leg.  Dissection was carried down to the tensor fascia lata muscle and the tensor fascia was then divided longitudinally to proceed with direct anterior process of the hip.  Circumflex vessels were identified and cauterized.  The hip capsule was identified and opened up in L-type format finding a moderate joint effusion.  There was hemarthrosis as well associated with her displaced femoral neck fracture.  This was a subcapital fracture.  A femoral neck cut was then made just proximal to the lesser trochanter but distal to the fracture with an oscillating saw and completed with an osteotome.  The corkscrew guide was placed in the femoral head and the femoral head was removed in its entirety.  A bent Hohmann was then placed over the medial acetabular rim and remnants of the acetabular labrum and other debris were removed.  Reaming was then initiated under direct visitation and direct fluoroscopy from a size 43 reamer and stepwise increments going up to a size 49 reamer with the last reamer placed under direct visitation and fluoroscopy so the depth of reaming, the inclination and anteversion could be assessed.  The real DePuy sector GRIPTION acetabular opponent size 50 was then placed without difficulty followed by the 32+4 polythene liner for that size 50 acetabular component.  Attention was then turned to the femur, with the leg externally rotated to 120 degrees, extended and brought down under the other leg, a Mueller retractor was placed medially and a Hohmann retractor was placed behind the greater trochanter.  A box cutting osteotome was used to  enter the femoral canal.  The lateral joint capsule was released as well.  Broaching was then initiated from a size 0 broach going up to a size 4 broach.  With a size 4 broach in place we trialed a standard offset femoral neck and a 32+1 trial hip ball.  The leg was brought back over and up and with traction and internal rotation reduced in the pelvis.  We assessed it radiographically and clinically and felt like we needed just a little bit more offset and leg length.  We dislocated the hip and with the trial components.  We placed the real Actis femoral component with high offset size 4 and went with the real 32+5 metal head ball and again reduce this and acetabular we are pleased with leg length, offset, range of motion and stability assessed radiographically and mechanically.  The soft tissue was then irrigated normal saline solution.  The joint capsule was closed with interrupted #1 Ethibond suture followed by #1 Vicryl to close the tensor fascia.  0 Vicryl used to close the deep tissue and 2-0 Vicryl used to close subcutaneous tissue.  The skin was closed with staples.  An Aquacel dressing was applied.  The patient was taken off the Hana table, awakened, extubated and taken recovery in stable addition with all final counts being correct and no complications noted.  Of note go-cart PA-C did assisted in entire case from beginning to end and his assistance was medically necessary and crucial for soft tissue management and retraction, helping guide implant placement and a layered closure of the wound.

## 2022-04-25 NOTE — Plan of Care (Signed)

## 2022-04-25 NOTE — Anesthesia Procedure Notes (Signed)
Procedure Name: Intubation Date/Time: 04/25/2022 7:48 AM  Performed by: Heide Scales, CRNAPre-anesthesia Checklist: Patient identified, Emergency Drugs available, Suction available and Patient being monitored Patient Re-evaluated:Patient Re-evaluated prior to induction Oxygen Delivery Method: Circle system utilized Preoxygenation: Pre-oxygenation with 100% oxygen Induction Type: IV induction Ventilation: Mask ventilation without difficulty Laryngoscope Size: Glidescope and 3 Grade View: Grade I Tube type: Oral Tube size: 7.0 mm Number of attempts: 1 Airway Equipment and Method: Stylet and Oral airway Placement Confirmation: ETT inserted through vocal cords under direct vision, positive ETCO2 and breath sounds checked- equal and bilateral Secured at: 23 cm Tube secured with: Tape Dental Injury: Teeth and Oropharynx as per pre-operative assessment  Difficulty Due To: Difficulty was anticipated, Difficult Airway- due to anterior larynx and Difficult Airway- due to limited oral opening

## 2022-04-26 ENCOUNTER — Encounter (HOSPITAL_COMMUNITY): Payer: Self-pay | Admitting: Orthopaedic Surgery

## 2022-04-26 DIAGNOSIS — S72002A Fracture of unspecified part of neck of left femur, initial encounter for closed fracture: Secondary | ICD-10-CM | POA: Diagnosis not present

## 2022-04-26 LAB — CBC
HCT: 27.6 % — ABNORMAL LOW (ref 36.0–46.0)
Hemoglobin: 9.3 g/dL — ABNORMAL LOW (ref 12.0–15.0)
MCH: 31.5 pg (ref 26.0–34.0)
MCHC: 33.7 g/dL (ref 30.0–36.0)
MCV: 93.6 fL (ref 80.0–100.0)
Platelets: 240 10*3/uL (ref 150–400)
RBC: 2.95 MIL/uL — ABNORMAL LOW (ref 3.87–5.11)
RDW: 12.6 % (ref 11.5–15.5)
WBC: 10.4 10*3/uL (ref 4.0–10.5)
nRBC: 0 % (ref 0.0–0.2)

## 2022-04-26 LAB — COMPREHENSIVE METABOLIC PANEL
ALT: 32 U/L (ref 0–44)
AST: 39 U/L (ref 15–41)
Albumin: 2.2 g/dL — ABNORMAL LOW (ref 3.5–5.0)
Alkaline Phosphatase: 58 U/L (ref 38–126)
Anion gap: 9 (ref 5–15)
BUN: 14 mg/dL (ref 8–23)
CO2: 29 mmol/L (ref 22–32)
Calcium: 8.6 mg/dL — ABNORMAL LOW (ref 8.9–10.3)
Chloride: 99 mmol/L (ref 98–111)
Creatinine, Ser: 0.54 mg/dL (ref 0.44–1.00)
GFR, Estimated: >60 mL/min (ref >60)
Glucose, Bld: 125 mg/dL — ABNORMAL HIGH (ref 70–99)
Potassium: 3.9 mmol/L (ref 3.5–5.1)
Sodium: 137 mmol/L (ref 135–145)
Total Bilirubin: 0.3 mg/dL (ref 0.3–1.2)
Total Protein: 5 g/dL — ABNORMAL LOW (ref 6.5–8.1)

## 2022-04-26 LAB — MAGNESIUM: Magnesium: 1.8 mg/dL (ref 1.7–2.4)

## 2022-04-26 NOTE — Progress Notes (Addendum)
Inpatient Rehab Admissions Coordinator:    Pt. Does not appear able to tolerate intensity of CIR. Additionally, I do not think she demonstrates sufficient medical necessity to obtain insurance authorization with her payor. I will not pursue CIR admit, Recommend TOC look toward other venues. I spoke with pt.'s husband and he is in agreement to pursue SNF.  Clemens Catholic, [place], [place] Admissions Coordinator  [phone] ([place]) [phone] (office)

## 2022-04-26 NOTE — Progress Notes (Signed)
Physical Therapy Treatment Patient Details Name: [name] MRN: [id number] DOB: [birth year] Today's Date: 04/26/2022   History of Present Illness Pt is 76 yo female who presents after a fall at home on 04/22/22 with a displaced femoral neck fx. Pt underwent L THA. PMH: PE, TIA, cerebellar CVA, NPH, chronic back pain due to spina bifida with gait instability, COPD, Afib, smoker.    PT Comments    Pt received in supine, agreeable to therapy session after premedication with IV pain meds. Pt anxious in anticipation of pain and reports decreased memory of previous day's events but understanding of need to participate and progress functional mobility and with good effort. Pt needing dense cues and increased time to initiate/perform tasks, pt rolling with up to +2 modA. Pt performs bed mobility with up to +2 maxA via log roll to less painful R side and partial stand from elevated bed height<>Stedy standing frame with +2 maxA. Pt able to accept minimal weight through her LLE but unable to stand fully upright or weight shift in stance due to LLE pain and symptoms of nausea/lightheadedness with standing trial. BP taken once pt back in supine, reading 120/46 (67). Pt breakfast arriving during session, pt set up to eat with bed in chair posture at end of session. Pt continues to benefit from PT services to progress toward functional mobility goals, disposition updated below per discussion with pt and supervising PT Rachel QB.   Recommendations for follow up therapy are one component of a multi-disciplinary discharge planning process, led by the attending physician.  Recommendations may be updated based on patient status, additional functional criteria and insurance authorization.  Follow Up Recommendations  Skilled nursing-short term rehab (<3 hours/day) Can patient physically be transported by private vehicle: No   Assistance Recommended at Discharge Frequent or constant Supervision/Assistance  Patient  can return home with the following Two people to help with walking and/or transfers;Two people to help with bathing/dressing/bathroom;Assistance with cooking/housework;Assist for transportation;Help with stairs or ramp for entrance   Equipment Recommendations  Other (comment) (TBD)    Recommendations for Other Services       Precautions / Restrictions Precautions Precautions: Fall Precaution Comments: history of falls but none recently Restrictions Weight Bearing Restrictions: Yes LLE Weight Bearing: Weight bearing as tolerated     Mobility  Bed Mobility Overal bed mobility: Needs Assistance Bed Mobility: Sidelying to Sit, Rolling, Sit to Sidelying Rolling: Mod assist, +2 for physical assistance Sidelying to sit: Max assist, +2 for physical assistance, HOB elevated     Sit to sidelying: Max assist, +2 for physical assistance General bed mobility comments: Increased time/effort to initiate, pt reports pain improved after LE "warm-up" with heel slides prior to rolling/log roll to sit on R EOB; Pt reports L hip too painful to sit on L side of bed    Transfers Overall transfer level: Needs assistance Equipment used: Ambulation equipment used Transfers: Sit to/from Stand Sit to Stand: Max assist, Total assist, +2 physical assistance, From elevated surface           General transfer comment: EOB<>Stedy x2 trials, pt stood ~75% upright on first trial and ~50% on second trial, pt with posterior lean while seated and Stedy was safest to prevent her feet from sliding out from underneath her.    Ambulation/Gait               General Gait Details: pt unable to weight shift in stance at [facility], crouched posture with standing trials  Balance Overall balance assessment: Needs assistance, History of Falls Sitting-balance support: Bilateral upper extremity supported, Feet supported Sitting balance-Leahy Scale: Poor Sitting balance - Comments: Constant external trunk support  needed; pt holding on to R bed rail throughout but able to lean and reach with each UE with cues but unable to maintain neutral/upright posture while sitting due to R/posterior lean while offloading her L hip -pain Postural control: Posterior lean, Right lateral lean Standing balance support: Bilateral upper extremity supported Standing balance-Leahy Scale: Zero Standing balance comment: +2 maxA to stand nearly upright in Stedy from elevated bed height                            Cognition Arousal/Alertness: Awake/alert Behavior During Therapy: WFL for tasks assessed/performed Overall Cognitive Status: Within Functional Limits for tasks assessed                                 General Comments: Self-directed and participatory with encouragement, increased time/effort and dense cues for initiating and sequencing each task. Pt reports decreased memory of previous days and sessions.        Exercises Total Joint Exercises Ankle Circles/Pumps: AROM, AAROM, Both, 10 reps, Seated (5 reps supine, 5 reps seated) Quad Sets: Both, 5 reps, Supine, AAROM Heel Slides: AAROM, Both, 10 reps, Supine Hip ABduction/ADduction: AAROM, PROM, Both, 5 reps, Supine Straight Leg Raises: PROM, Both, 5 reps, Supine Long Arc Quad: AAROM, 5 reps, Seated, PROM, Both (pt unable to assist on LLE due to pain/fatigue)    General Comments        Pertinent Vitals/Pain Pain Assessment Pain Assessment: Faces Faces Pain Scale: Hurts even more Pain Location: L hip and B heels, worst initially with LE ROM then improved after heel slides and transfer to/from EOB<>supine (pt reports 1/10 once back in supine at end) Pain Descriptors / Indicators: Tightness, Grimacing, Operative site guarding Pain Intervention(s): Limited activity within patient's tolerance, Monitored during session, Premedicated before session, Repositioned, Other (comment) (pt, spouse and RN instructed on floating her heels off the  pillow rather than resting them onto pillow in supine which causes increased pressure on the heel)           PT Goals (current goals can now be found in the care plan section) Acute Rehab PT Goals Patient Stated Goal: rehab and then home PT Goal Formulation: With patient/family Time For Goal Achievement: 05/09/22 Progress towards PT goals: Progressing toward goals    Frequency    Min 3X/week      PT Plan Discharge plan needs to be updated    Co-evaluation PT/OT/SLP Co-Evaluation/Treatment: Yes Reason for Co-Treatment: Complexity of the patient's impairments (multi-system involvement);For patient/therapist safety;To address functional/ADL transfers PT goals addressed during session: Mobility/safety with mobility;Balance;Strengthening/ROM        AM-PAC PT "6 Clicks" Mobility   Outcome Measure  Help needed turning from your back to your side while in a flat bed without using bedrails?: A Lot Help needed moving from lying on your back to sitting on the side of a flat bed without using bedrails?: Total Help needed moving to and from a bed to a chair (including a wheelchair)?: Total Help needed standing up from a chair using your arms (e.g., wheelchair or bedside chair)?: Total Help needed to walk in hospital room?: Total Help needed climbing 3-5 steps with a railing? : Total 6 Click Score: 7  End of Session Equipment Utilized During Treatment: Oxygen;Gait belt Activity Tolerance: Patient limited by pain;Patient limited by fatigue;Treatment limited secondary to medical complications (Comment);Other (comment) (symptoms of nausea/fatigue with sitting) Patient left: in bed;with call bell/phone within reach;with family/visitor present;with bed alarm set;Other (comment) (pt bed in upright chair posture and pt set up to eat her breakfast (arrived during session -late today)) Nurse Communication: Mobility status;Need for lift equipment;Precautions;Other (comment) (heels need to be  floated not pushing into pillow in supine; pt symptoms and soft MAP post-mobility) PT Visit Diagnosis: Unsteadiness on feet (R26.81);Repeated falls (R29.6);Muscle weakness (generalized) (M62.81);Difficulty in walking, not elsewhere classified (R26.2);Pain Pain - Right/Left: Left Pain - part of body: Hip     Time: 5681-2751 PT Time Calculation (min) (ACUTE ONLY): 40 min  Charges:  $Therapeutic Exercise: 8-22 mins $Therapeutic Activity: 8-22 mins                     Lance Huaracha P., PTA Acute Rehabilitation Services Secure Chat Preferred 9a-5:30pm Office: [place] 04/26/2022, 12:58 PM

## 2022-04-26 NOTE — Progress Notes (Signed)
Subjective: 1 Day Post-Op Procedure(s) (LRB): TOTAL HIP ARTHROPLASTY ANTERIOR APPROACH (Left) Patient reports pain as moderate.  Tolerated surgery well yesterday.  Acute blood loss anemia from her fracture and surgery, but well-tolerated.  Objective: Vital signs in last 24 hours: Temp:  [97 F (36.1 C)-98.7 F (37.1 C)] 98.7 F (37.1 C) (11/25 0319) Pulse Rate:  [68-83] 83 (11/25 0319) Resp:  [15-22] 18 (11/25 0319) BP: (97-139)/(40-55) 128/48 (11/25 0319) SpO2:  [93 %-99 %] 95 % (11/24 2226)  Intake/Output from previous day: 11/24 0701 - 11/25 0700 In: 2561.8 [P.O.:240; I.V.:2271.8; IV Piggyback:50] Out: 100 [Blood:100] Intake/Output this shift: No intake/output data recorded.  Recent Labs    04/23/22 0847 04/24/22 0451 04/25/22 0036 04/26/22 0306  HGB 12.1 11.6* 10.8* 9.3*   Recent Labs    04/25/22 0036 04/26/22 0306  WBC 9.7 10.4  RBC 3.40* 2.95*  HCT 32.1* 27.6*  PLT 262 240   Recent Labs    04/26/22 0306  NA 137  K 3.9  CL 99  CO2 29  BUN 14  CREATININE 0.54  GLUCOSE 125*  CALCIUM 8.6*   No results for input(s): "LABPT", "INR" in the last 72 hours.  Sensation intact distally Intact pulses distally Dorsiflexion/Plantar flexion intact Incision: dressing C/D/I   Assessment/Plan: 1 Day Post-Op Procedure(s) (LRB): TOTAL HIP ARTHROPLASTY ANTERIOR APPROACH (Left) Up with therapy  Resume Eliquis    Mcarthur Rossetti 04/26/2022, 8:33 AM

## 2022-04-26 NOTE — Progress Notes (Signed)
PROGRESS NOTE  [name] ACZ:[id number] DOB: March 10, 1946 DOA: 04/22/2022 PCP: [place] Nation, MD   LOS: 4 days   Brief Narrative / Interim history: 76 year old female with a history of atrial fibrillation on anticoagulation, NPH, presents to the hospital after a fall at home.  Patient was making a turn in her kitchen when she lost her balance and fell.  She was found to have a left femoral neck fracture status postrepair on 11/24.   Subjective / 24h Interval events: Doing well this morning, feeling better.  Assesement and Plan: Principal Problem:   Left displaced femoral neck fracture (HCC) Active Problems:   Fall at home, initial encounter   Paroxysmal atrial fibrillation (HCC)   Chronic anticoagulation   History of pulmonary embolus (PE)   H/O: CVA (cerebrovascular accident)   NPH (normal pressure hydrocephalus) (HCC)   Hyperlipidemia   History of left breast cancer   Tobacco use disorder   Closed subcapital fracture of left femur (HCC)   Principal problem Left femoral neck fracture secondary to fall  - Patient ambulates with use of a rolling walker at baseline.  Notes tripping and falling sometime around noon on the day of admission landing on her left hip.  Denied any loss of consciousness or trauma to her head.  She was admitted to the hospital, orthopedic surgery was consulted, and she is supposed THA on 11/24 by Dr. Ninfa Linden   Active problems Paroxysmal atrial fibrillation on chronic anticoagulation -Patient appears to be in a sinus rhythm.  She had a loop recorder placed in January of this year.  Readings were noted to show concern for paroxysmal atrial fibrillation for which she was started on Eliquis in August of this year by her cardiologist Dr. Einar Gip. CHA2DS2-VASc score = 5.  -Back on Eliquis.   History of pulmonary embolism -Patient believes she suffered the pulmonary embolism around 2011.  Continue Eliquis   History of CVA -Patient with prior history of  strokes in 2019 and 05/2021.   Normal pressure hydrocephalus -Patient at baseline, has gait instability secondary to hydrocephalus.  CT scan of the brain noted stable dilation of the ventricles. Continue outpatient follow-up with neurology  Acute blood loss anemia-postoperatively, hemoglobin decreased from 11.6 on admission to 9.3 this morning.  No active bleeding, continue to monitor.  Does not need a transfusion  Hyperlipidemia -Continue atorvastatin   History of breast cancer -S/p lumpectomy followed by chemotherapy and radiation therapy in 2003.   Tobacco use disorder -Nicotine patch offered  Scheduled Meds:  acetaminophen  650 mg Oral Q6H   apixaban  5 mg Oral BID   atorvastatin  20 mg Oral Daily   docusate sodium  100 mg Oral BID   methocarbamol  500 mg Oral TID   senna  1 tablet Oral QHS   Continuous Infusions:  sodium chloride 75 mL/hr at 04/25/22 1119   methocarbamol (ROBAXIN) IV 500 mg (04/23/22 1654)   methocarbamol (ROBAXIN) IV     PRN Meds:.acetaminophen, alum & mag hydroxide-simeth, calcium carbonate, diphenhydrAMINE, HYDROcodone-acetaminophen, HYDROcodone-acetaminophen, HYDROcodone-acetaminophen, HYDROmorphone (DILAUDID) injection, menthol-cetylpyridinium **OR** phenol, methocarbamol **OR** methocarbamol (ROBAXIN) IV, metoCLOPramide **OR** metoCLOPramide (REGLAN) injection, morphine injection, ondansetron (ZOFRAN) IV, ondansetron **OR** ondansetron (ZOFRAN) IV  Current Outpatient Medications  Medication Instructions   apixaban (ELIQUIS) 5 mg, Oral, 2 times daily   atorvastatin (LIPITOR) 20 mg, Oral, Daily   CALCIUM PO 1 tablet, Oral, Daily   Cholecalciferol (VITAMIN D PO) 1 tablet, Oral, Daily   Coenzyme Q10 (CO Q 10 PO) 1  tablet, Oral, Daily   furosemide (LASIX) 20 mg, Daily   Multiple Vitamin (MULTIVITAMIN WITH MINERALS) TABS tablet 1 tablet, Oral, Daily   Multiple Vitamins-Minerals (PRESERVISION AREDS 2) CAPS Oral, 2 times daily   Omega-3 Fatty Acids (FISH OIL)  1000 MG CAPS Oral, Daily    Diet Orders (From admission, onward)     Start     Ordered   04/25/22 1106  Diet regular Room service appropriate? Yes; Fluid consistency: Thin  Diet effective now       Question Answer Comment  Room service appropriate? Yes   Fluid consistency: Thin      04/25/22 1105            DVT prophylaxis: SCDs Start: 04/25/22 1033Heparin infusion apixaban (ELIQUIS) tablet 5 mg   Lab Results  Component Value Date   PLT 240 04/26/2022      Code Status: Full Code  Family Communication: No family at bedside  Status is: Inpatient  Remains inpatient appropriate because: Or this morning  Level of care: Telemetry Surgical  Consultants:  Orthopedic surgery   Objective: Vitals:   04/25/22 2026 04/25/22 2156 04/25/22 2226 04/26/22 0319  BP:  (!) 105/40 (!) 108/55 (!) 128/48  Pulse:    83  Resp:   15 18  Temp: 98.7 F (37.1 C)  98 F (36.7 C) 98.7 F (37.1 C)  TempSrc: Oral  Oral Oral  SpO2:   95%   Weight:      Height:        Intake/Output Summary (Last 24 hours) at 04/26/2022 1027 Last data filed at 04/26/2022 0500 Gross per 24 hour  Intake 1461.83 ml  Output --  Net 1461.83 ml    Wt Readings from Last 3 Encounters:  04/22/22 72.6 kg  04/04/22 70.1 kg  02/25/22 68 kg    Examination:  Constitutional: NAD Eyes: lids and conjunctivae normal, no scleral icterus ENMT: mmm Neck: normal, supple Respiratory: clear to auscultation bilaterally, no wheezing, no crackles. Normal respiratory effort.  Cardiovascular: Regular rate and rhythm, no murmurs / rubs / gallops. No LE edema. Abdomen: soft, no distention, no tenderness. Bowel sounds positive.  Skin: no rashes Neurologic: no focal deficits, equal strength  Data Reviewed: I have independently reviewed following labs and imaging studies   CBC Recent Labs  Lab 04/22/22 1505 04/23/22 0847 04/24/22 0451 04/25/22 0036 04/26/22 0306  WBC 9.0 12.5* 11.1* 9.7 10.4  HGB 12.9 12.1  11.6* 10.8* 9.3*  HCT 39.0 36.4 35.8* 32.1* 27.6*  PLT 323 281 269 262 240  MCV 95.4 95.0 96.5 94.4 93.6  MCH 31.5 31.6 31.3 31.8 31.5  MCHC 33.1 33.2 32.4 33.6 33.7  RDW 13.3 13.0 13.0 12.8 12.6  LYMPHSABS 1.6  --   --   --   --   MONOABS 0.5  --   --   --   --   EOSABS 0.0  --   --   --   --   BASOSABS 0.0  --   --   --   --      Recent Labs  Lab 04/22/22 1505 04/22/22 1812 04/26/22 0306  NA 139  --  137  K 4.1  --  3.9  CL 104  --  99  CO2 26  --  29  GLUCOSE 117*  --  125*  BUN 15  --  14  CREATININE 0.48  --  0.54  CALCIUM 9.2  --  8.6*  AST  --   --  39  ALT  --   --  32  ALKPHOS  --   --  58  BILITOT  --   --  0.3  ALBUMIN  --   --  2.2*  MG  --   --  1.8  INR  --  1.1  --      ------------------------------------------------------------------------------------------------------------------ No results for input(s): "CHOL", "HDL", "LDLCALC", "TRIG", "CHOLHDL", "LDLDIRECT" in the last 72 hours.  No results found for: "HGBA1C" ------------------------------------------------------------------------------------------------------------------ No results for input(s): "TSH", "T4TOTAL", "T3FREE", "THYROIDAB" in the last 72 hours.  Invalid input(s): "FREET3"  Cardiac Enzymes No results for input(s): "CKMB", "TROPONINI", "MYOGLOBIN" in the last 168 hours.  Invalid input(s): "CK" ------------------------------------------------------------------------------------------------------------------ No results found for: "BNP"  CBG: No results for input(s): "GLUCAP" in the last 168 hours.  Recent Results (from the past 240 hour(s))  Surgical pcr screen     Status: None   Collection Time: 04/25/22  4:32 AM   Specimen: Nasal Mucosa; Nasal Swab  Result Value Ref Range Status   MRSA, PCR NEGATIVE NEGATIVE Final   Staphylococcus aureus NEGATIVE NEGATIVE Final    Comment: (NOTE) The Xpert SA Assay (FDA approved for NASAL specimens in patients 3 years of age and  older), is one component of a comprehensive surveillance program. It is not intended to diagnose infection nor to guide or monitor treatment. Performed at [facility], [place] [address], [place], [place] [address]      Radiology Studies: No results found.   Marzetta Board, MD, PhD [facility]  Between 7 am - 7 pm I am available, please contact me via Amion (for emergencies) or Securechat (non urgent messages)  Between 7 pm - 7 am I am not available, please contact night coverage MD/APP via Amion

## 2022-04-26 NOTE — TOC Initial Note (Signed)
Transition of Care [facility]) - Initial/Assessment Note    Patient Details  Name: [name] MRN: [id number] Date of Birth: 1946-04-07  [facility] of [facility] CM/SW Contact:    Ina Homes, [place] Phone Number: 04/26/2022, 11:01 AM  Clinical Narrative:                  SW informed pt declined from CIR, family agreeable to SNF.   SW spoke with pt's husband Simona Huh [phone]) via phone. He is currently at bedside with pt. Pt/Dennis agreeable to SNF search. SW explained  process and need for insurance approval to go. SW read off list of SNF's in pt area. Simona Huh agreeable to SNF, preference is [facility].    Expected Discharge Plan: Skilled Nursing Facility Barriers to Discharge: Continued Medical Work up   Patient Goals and CMS Choice Patient states their goals for this hospitalization and ongoing recovery are:: return home CMS Medicare.gov Compare Post Acute Care list provided to:: Patient Represenative (must comment) (husband) Choice offered to / list presented to : Spouse  Expected Discharge Plan and Services Expected Discharge Plan: [place] Acute Care Choice: [place] Living arrangements for the past 2 months: Single Family Home                                      Prior Living Arrangements/Services Living arrangements for the past 2 months: Single Family Home Lives with:: Spouse Patient language and need for interpreter reviewed:: Yes Do you feel safe going back to the place where you live?: Yes      Need for Family Participation in Patient Care: Yes (Comment) Care giver support system in place?: Yes (comment)   Criminal Activity/Legal Involvement Pertinent to Current Situation/Hospitalization: No - Comment as needed  Activities of Daily Living Home Assistive Devices/Equipment: Walker (specify type) ADL Screening (condition at time of admission) Patient's cognitive ability adequate to safely complete daily  activities?: Yes Is the patient deaf or have difficulty hearing?: No Does the patient have difficulty seeing, even when wearing glasses/contacts?: Yes Does the patient have difficulty concentrating, remembering, or making decisions?: No Patient able to express need for assistance with ADLs?: Yes Does the patient have difficulty dressing or bathing?: No Independently performs ADLs?: No Communication: Dependent Is this a change from baseline?: Change from baseline, expected to last >3 days Dressing (OT): Dependent Is this a change from baseline?: Change from baseline, expected to last >3 days Grooming: Dependent Is this a change from baseline?: Change from baseline, expected to last >3 days Feeding: Dependent Is this a change from baseline?: Change from baseline, expected to last >3 days Bathing: Dependent Is this a change from baseline?: Change from baseline, expected to last >3 days Toileting: Dependent Is this a change from baseline?: Change from baseline, expected to last >3days In/Out Bed: Dependent Is this a change from baseline?: Change from baseline, expected to last >3 days Does the patient have difficulty walking or climbing stairs?: Yes Weakness of Legs: Both Weakness of Arms/Hands: None  Permission Sought/Granted Permission sought to share information with : Facility Art therapist granted to share information with : Yes, Verbal Permission Granted  Share Information with NAME: Simona Huh  Permission granted to share info w AGENCY: SNF  Permission granted to share info w Relationship: Husband  Permission granted to share info w Contact Information: [phone]  Emotional Assessment  Appearance:: Other (Comment Required (unable to assess) Attitude/Demeanor/Rapport: Unable to Assess Affect (typically observed): Unable to Assess Orientation: : Oriented to Self, Oriented to Place, Oriented to  Time, Oriented to Situation      Admission diagnosis:  Left  displaced femoral neck fracture ([place]) [S72.002A] Fall, initial encounter [W19.XXXA] Closed fracture of neck of left femur, initial encounter ([place]) [S72.002A] Patient Active Problem List   Diagnosis Date Noted   Closed subcapital fracture of left femur ([place]) 04/23/2022   Left displaced femoral neck fracture ([place]) 04/22/2022   Fall at home, initial encounter 04/22/2022   History of pulmonary embolus (PE) 04/22/2022   Paroxysmal atrial fibrillation ([place]) 04/22/2022   Chronic anticoagulation 04/22/2022   NPH (normal pressure hydrocephalus) ([place]) 11/07/2021   Abnormality of gait due to impairment of balance 11/07/2021   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 06/15/2021   Asymptomatic bilateral carotid artery stenosis 05/17/2021   Ectopic atrial rhythm 05/17/2021   Syncope and collapse 05/17/2021   H/O: CVA (cerebrovascular accident) 11/01/2019   Cognitive deficits 09/26/2019   Gait instability 09/26/2019   Bleeding from varicose vein 01/05/2019   Dizziness 01/03/2018   DDD (degenerative disc disease), lumbar 08/31/2017   Generalized OA 08/31/2017   Venous hypertension of both lower extremities 07/03/2017   Osteopenia 05/13/2016   History of left breast cancer 05/13/2016   Glaucoma 05/13/2016   Hyperlipidemia 05/13/2016   Urinary incontinence 05/13/2016   Tobacco use disorder 05/13/2016   GERD (gastroesophageal reflux disease) 06/27/2013   H/O: upper GI bleed 06/27/2013   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[place]) 11/21/2010   PCP:  Pope Nation, MD Pharmacy:   [facility], [place] [address] [place] [address] Phone: [phone] Fax: [phone]  [facility] [place], [place] [address] Phone: [phone] Fax: [phone]  [facility], [address] [place] [address] Phone: [phone] Fax:  [phone]     Social Determinants of Health (SDOH) Interventions    Readmission Risk Interventions     No data to display

## 2022-04-26 NOTE — Evaluation (Signed)
Occupational Therapy Evaluation Patient Details Name: [name] MRN: [id number] DOB: July 11, 1945 Today's Date: 04/26/2022   History of Present Illness Pt is 76 yo female who presents after a fall at home on 04/22/22 with a displaced femoral neck fx. Pt underwent L THA. PMH: PE, TIA, cerebellar CVA, NPH, chronic back pain due to spina bifida with gait instability, COPD, Afib, smoker.   Clinical Impression   Pt at this time did not recall what they completed in last therapy session with Physical Therapy but noted to make progression from last session. Pt noted to take increase in time to complete all tasks at this time with mod-max x2 with all bed mobility. Pt when sitting at EOB required constant mod-max assist while sitting at EOB and was able to attempt sit to stand transfers with steady with max x2 assist. Pt required mod assist for UE ADLS and max x2  for LB ADLS. Pt currently with functional limitations due to the deficits listed below (see OT Problem List).  Pt will benefit from skilled OT to increase their safety and independence with ADL and functional mobility for ADL to facilitate discharge to venue listed below.        Recommendations for follow up therapy are one component of a multi-disciplinary discharge planning process, led by the attending physician.  Recommendations may be updated based on patient status, additional functional criteria and insurance authorization.   Follow Up Recommendations  Skilled nursing-short term rehab (<3 hours/day)     Assistance Recommended at Discharge Frequent or constant Supervision/Assistance  Patient can return home with the following Two people to help with walking and/or transfers;A lot of help with bathing/dressing/bathroom;Assistance with cooking/housework;Assist for transportation;Help with stairs or ramp for entrance    Functional Status Assessment  Patient has had a recent decline in their functional status and demonstrates the  ability to make significant improvements in function in a reasonable and predictable amount of time.  Equipment Recommendations  Other (comment) (TBD at next venue)    Recommendations for Other Services       Precautions / Restrictions Precautions Precautions: Fall Precaution Comments: history of falls but none recently Restrictions Weight Bearing Restrictions: Yes LLE Weight Bearing: Weight bearing as tolerated      Mobility Bed Mobility Overal bed mobility: Needs Assistance Bed Mobility: Sidelying to Sit, Rolling, Sit to Sidelying Rolling: Mod assist, +2 for physical assistance Sidelying to sit: Max assist, +2 for physical assistance, HOB elevated Supine to sit: Max assist, HOB elevated Sit to supine: Max assist, +2 for physical assistance Sit to sidelying: Max assist, +2 for physical assistance General bed mobility comments: Increased time/effort to initiate, pt reports pain improved after LE "warm-up" with heel slides prior to rolling/log roll to sit on R EOB; Pt reports L hip too painful to sit on L side of bed    Transfers Overall transfer level: Needs assistance Equipment used: Ambulation equipment used Transfers: Sit to/from Stand Sit to Stand: Max assist, Total assist, +2 physical assistance, From elevated surface           General transfer comment: EOB<>Stedy x2 trials, pt stood ~75% upright on first trial and ~50% on second trial, pt with posterior lean while seated and Stedy was safest to prevent her feet from sliding out from underneath her.      Balance Overall balance assessment: Needs assistance, History of Falls Sitting-balance support: Bilateral upper extremity supported, Feet supported Sitting balance-Leahy Scale: Poor Sitting balance - Comments: Constant external trunk support  needed; pt holding on to R bed rail throughout but able to lean and reach with each UE with cues but unable to maintain neutral/upright posture while sitting due to R/posterior  lean while offloading her L hip -pain Postural control: Posterior lean, Right lateral lean Standing balance support: Bilateral upper extremity supported Standing balance-Leahy Scale: Zero Standing balance comment: +2 maxA to stand nearly upright in Stedy from elevated bed height                           ADL either performed or assessed with clinical judgement   ADL Overall ADL's : Needs assistance/impaired Eating/Feeding: Set up;Sitting;Bed level   Grooming: Wash/dry hands;Wash/dry face;Set up;Sitting;Bed level   Upper Body Bathing: Sitting;Bed level;Cueing for safety;Cueing for sequencing;Moderate assistance   Lower Body Bathing: Maximal assistance;+2 for physical assistance;+2 for safety/equipment;Cueing for safety;Cueing for sequencing;Bed level;Sit to/from stand   Upper Body Dressing : Moderate assistance   Lower Body Dressing: Maximal assistance;+2 for physical assistance;+2 for safety/equipment;Sit to/from stand;Bed level;Cueing for safety;Cueing for sequencing   Toilet Transfer:  (unable to complete)   Toileting- Clothing Manipulation and Hygiene: Bed level;Maximal assistance;+2 for physical assistance;+2 for safety/equipment   Tub/ Shower Transfer:  (unable to complete)   Functional mobility during ADLs:  (unable to complete, was able to attempt in sit to stand with steady)       Vision         Perception     Praxis      Pertinent Vitals/Pain Pain Assessment Pain Assessment: Faces Faces Pain Scale: Hurts even more Pain Location: L hip and B heels, worst initially with LE ROM then improved after heel slides and transfer to/from EOB<>supine (pt reports 1/10 once back in supine at end) Pain Descriptors / Indicators: Tightness, Grimacing, Operative site guarding Pain Intervention(s): Limited activity within patient's tolerance, Monitored during session, Premedicated before session, Repositioned     Hand Dominance Right   Extremity/Trunk Assessment  Upper Extremity Assessment Upper Extremity Assessment: Generalized weakness (Pt reported increase difficulties with LUE but noted when having an itch they were able to use with no assist)       Cervical / Trunk Assessment Cervical / Trunk Assessment: Kyphotic   Communication Communication Communication: No difficulties   Cognition Arousal/Alertness: Awake/alert Behavior During Therapy: WFL for tasks assessed/performed Overall Cognitive Status: Within Functional Limits for tasks assessed                                 General Comments: Self-directed and participatory with encouragement, increased time/effort and dense cues for initiating and sequencing each task. Pt reports decreased memory of previous days and sessions.     General Comments       Exercises     Shoulder Instructions      Home Living Family/patient expects to be discharged to:: Private residence Living Arrangements: Spouse/significant other Available Help at Discharge: Family;Available 24 hours/day Type of Home: House Home Access: Stairs to enter [employer] of Steps: 4 Entrance Stairs-Rails: Left Home Layout: Two level;Laundry or work area in Building surveyor of Steps: flight Alternate Level Stairs-Rails: Right Bathroom Shower/Tub: Teacher, early years/pre: Handicapped height     Home Equipment: Conservation officer, nature (2 wheels);Cane - quad   Additional Comments: pt spends most of the day in the basement because her computer and TV are down there and her husband doesn't allow her  to smoke upstairs as he does not smoke. He reports that she smokes a pack a day      Prior Functioning/Environment Prior Level of Function : Needs assist             Mobility Comments: ambulates in house with RW and outside with [facility]. Does not drive, husband drives ADLs Comments: can't get socks on so wears slip on shoes, gets hair done, washes off in shower, husband helps  in kitchen. They get take out, no cooking        OT Problem List: Decreased strength;Decreased range of motion;Decreased activity tolerance;Impaired balance (sitting and/or standing);Decreased safety awareness;Decreased knowledge of use of DME or AE;Cardiopulmonary status limiting activity;Pain      OT Treatment/Interventions: Self-care/ADL training;Therapeutic exercise;DME and/or AE instruction;Therapeutic activities;Patient/family education;Balance training    OT Goals(Current goals can be found in the care plan section) Acute Rehab OT Goals Patient Stated Goal: to be able to have less pain OT Goal Formulation: With patient Time For Goal Achievement: 05/10/22 Potential to Achieve Goals: Good ADL Goals Pt Will Perform Grooming: Independently;sitting Pt Will Perform Upper Body Bathing: with supervision;sitting Pt Will Perform Lower Body Bathing: with max assist;sit to/from stand Pt Will Transfer to Toilet: stand pivot transfer;bedside commode Additional ADL Goal #1: Pt will be able to sit at EOB with min guard for 5 mins to prepare for transfers  OT Frequency: Min 2X/week    Co-evaluation PT/OT/SLP Co-Evaluation/Treatment: Yes Reason for Co-Treatment: Complexity of the patient's impairments (multi-system involvement) PT goals addressed during session: Mobility/safety with mobility;Balance;Strengthening/ROM OT goals addressed during session: ADL's and self-care      AM-PAC OT "6 Clicks" Daily Activity     Outcome Measure Help from another person eating meals?: A Little Help from another person taking care of personal grooming?: A Little Help from another person toileting, which includes using toliet, bedpan, or urinal?: Total Help from another person bathing (including washing, rinsing, drying)?: A Lot Help from another person to put on and taking off regular upper body clothing?: A Lot Help from another person to put on and taking off regular lower body clothing?: A Lot 6 Click  Score: 13   End of Session Equipment Utilized During Treatment: Gait belt Nurse Communication: Mobility status  Activity Tolerance: Patient limited by pain Patient left: in bed;with call bell/phone within reach;with bed alarm set  OT Visit Diagnosis: Unsteadiness on feet (R26.81);Other abnormalities of gait and mobility (R26.89);Repeated falls (R29.6);Muscle weakness (generalized) (M62.81);Pain Pain - Right/Left: Left Pain - part of body: Leg                Time: 1132-1218 OT Time Calculation (min): 46 min Charges:  OT General Charges $OT Visit: 1 Visit OT Evaluation $OT Eval Low Complexity: 1 Low OT Treatments $Self Care/Home Management : 8-22 mins  Joeseph Amor OTR/L  Acute Rehab Services  [phone] office number [phone] pager number   Joeseph Amor 04/26/2022, 1:24 PM

## 2022-04-26 NOTE — Discharge Instructions (Signed)

## 2022-04-26 NOTE — NC FL2 (Addendum)
[place] LEVEL OF CARE SCREENING TOOL     IDENTIFICATION  Patient Name: [name] Birthdate: 09-06-1945 Sex: female Admission Date (Current Location): 04/22/2022  [facility] and [place] Number:  Herbalist and Address:  [facility], [place] [address] [place], [place] [address]      Provider Number: [id number]  Attending Physician Name and Address:  Caren Griffins, MD  Relative Name and Phone Number:  Roanna, Reaves [phone]    Current Level of Care: Hospital Recommended Level of Care: [place] Prior Approval Number:    Date Approved/Denied:   PASRR Number: [id number]  Discharge Plan: SNF    Current Diagnoses: Patient Active Problem List   Diagnosis Date Noted   Closed subcapital fracture of left femur ([place]) 04/23/2022   Left displaced femoral neck fracture ([place]) 04/22/2022   Fall at home, initial encounter 04/22/2022   History of pulmonary embolus (PE) 04/22/2022   Paroxysmal atrial fibrillation ([place]) 04/22/2022   Chronic anticoagulation 04/22/2022   NPH (normal pressure hydrocephalus) ([place]) 11/07/2021   Abnormality of gait due to impairment of balance 11/07/2021   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 06/15/2021   Asymptomatic bilateral carotid artery stenosis 05/17/2021   Ectopic atrial rhythm 05/17/2021   Syncope and collapse 05/17/2021   H/O: CVA (cerebrovascular accident) 11/01/2019   Cognitive deficits 09/26/2019   Gait instability 09/26/2019   Bleeding from varicose vein 01/05/2019   Dizziness 01/03/2018   DDD (degenerative disc disease), lumbar 08/31/2017   Generalized OA 08/31/2017   Venous hypertension of both lower extremities 07/03/2017   Osteopenia 05/13/2016   History of left breast cancer 05/13/2016   Glaucoma 05/13/2016   Hyperlipidemia 05/13/2016   Urinary incontinence 05/13/2016   Tobacco use disorder 05/13/2016   GERD (gastroesophageal reflux disease)  06/27/2013   H/O: upper GI bleed 06/27/2013   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[place]) 11/21/2010    Orientation RESPIRATION BLADDER Height & Weight     Self, Time, Situation, Place  Normal Incontinent, Indwelling catheter Weight: 160 lb (72.6 kg) Height:  '5\' 8"'$  (172.7 cm)  BEHAVIORAL SYMPTOMS/MOOD NEUROLOGICAL BOWEL NUTRITION STATUS      Continent Diet (see discharge summary)  AMBULATORY STATUS COMMUNICATION OF NEEDS Skin   Limited Assist Verbally Other (Comment), Surgical wounds (left hip incision)                       Personal Care Assistance Level of Assistance  Bathing, Dressing Bathing Assistance: Limited assistance   Dressing Assistance: Limited assistance     Functional Limitations Info  Sight Sight Info: Impaired        SPECIAL CARE FACTORS FREQUENCY  PT (By licensed PT), OT (By licensed OT)     PT Frequency: 4-5x/wk OT Frequency: 4-5x/wk            Contractures Contractures Info: Not present    Additional Factors Info  Code Status, Allergies Code Status Info: FULL Allergies Info: Sulfa abx           Current Medications (04/26/2022):  This is the current hospital active medication list Current Facility-Administered Medications  Medication Dose Route Frequency Provider Last Rate Last Admin   0.9 %  sodium chloride infusion   Intravenous Continuous Mcarthur Rossetti, MD 75 mL/hr at 04/25/22 1119 New Bag at 04/25/22 1119   acetaminophen (TYLENOL) tablet 325-650 mg  325-650 mg Oral Q6H PRN Mcarthur Rossetti, MD       acetaminophen (TYLENOL) tablet 650 mg  650 mg Oral Q6H Mcarthur Rossetti, MD   650 mg at 04/26/22 1042   alum & mag hydroxide-simeth (MAALOX/MYLANTA) 200-200-20 MG/5ML suspension 15 mL  15 mL Oral Q4H PRN Mcarthur Rossetti, MD   15 mL at 04/24/22 2338   apixaban (ELIQUIS) tablet 5 mg  5 mg Oral BID Mcarthur Rossetti, MD   5 mg at 04/26/22 1041   atorvastatin (LIPITOR) tablet 20 mg  20 mg Oral Daily Mcarthur Rossetti, MD   20 mg at 04/26/22 1041   calcium carbonate (TUMS - dosed in mg elemental calcium) chewable tablet 200 mg of elemental calcium  1 tablet Oral TID PRN Mcarthur Rossetti, MD   200 mg of elemental calcium at 04/24/22 1002   diphenhydrAMINE (BENADRYL) 12.5 MG/5ML elixir 12.5-25 mg  12.5-25 mg Oral Q4H PRN Mcarthur Rossetti, MD       docusate sodium (COLACE) capsule 100 mg  100 mg Oral BID Mcarthur Rossetti, MD   100 mg at 04/25/22 2232   HYDROcodone-acetaminophen (NORCO) 7.5-325 MG per tablet 1-2 tablet  1-2 tablet Oral Q4H PRN Mcarthur Rossetti, MD       HYDROcodone-acetaminophen (NORCO/VICODIN) 5-325 MG per tablet 1-2 tablet  1-2 tablet Oral Q6H PRN Mcarthur Rossetti, MD   2 tablet at 04/24/22 2304   HYDROcodone-acetaminophen (NORCO/VICODIN) 5-325 MG per tablet 1-2 tablet  1-2 tablet Oral Q4H PRN Mcarthur Rossetti, MD       HYDROmorphone (DILAUDID) injection 0.5-1 mg  0.5-1 mg Intravenous Q4H PRN Mcarthur Rossetti, MD   1 mg at 04/24/22 1625   menthol-cetylpyridinium (CEPACOL) lozenge 3 mg  1 lozenge Oral PRN Mcarthur Rossetti, MD       Or   phenol (CHLORASEPTIC) mouth spray 1 spray  1 spray Mouth/Throat PRN Mcarthur Rossetti, MD       methocarbamol (ROBAXIN) tablet 500 mg  500 mg Oral TID Mcarthur Rossetti, MD   500 mg at 04/26/22 1043   Or   methocarbamol (ROBAXIN) 500 mg in dextrose 5 % 50 mL IVPB  500 mg Intravenous TID Mcarthur Rossetti, MD 110 mL/hr at 04/23/22 1654 500 mg at 04/23/22 1654   methocarbamol (ROBAXIN) tablet 500 mg  500 mg Oral Q6H PRN Mcarthur Rossetti, MD   500 mg at 04/25/22 1108   Or   methocarbamol (ROBAXIN) 500 mg in dextrose 5 % 50 mL IVPB  500 mg Intravenous Q6H PRN Mcarthur Rossetti, MD       metoCLOPramide (REGLAN) tablet 5-10 mg  5-10 mg Oral Q8H PRN Mcarthur Rossetti, MD       Or   metoCLOPramide (REGLAN) injection 5-10 mg  5-10 mg Intravenous Q8H PRN Mcarthur Rossetti, MD       morphine (PF) 2 MG/ML injection 0.5-1 mg  0.5-1 mg Intravenous Q2H PRN Mcarthur Rossetti, MD       ondansetron [facility]) injection 4 mg  4 mg Intravenous Q6H PRN Mcarthur Rossetti, MD   4 mg at 04/24/22 1313   ondansetron (ZOFRAN) tablet 4 mg  4 mg Oral Q6H PRN Mcarthur Rossetti, MD       Or   ondansetron [facility]) injection 4 mg  4 mg Intravenous Q6H PRN Mcarthur Rossetti, MD       senna Don[facility]) tablet 8.6 mg  1 tablet Oral QHS Mcarthur Rossetti, MD   8.6 mg at 04/23/22 2237     Discharge Medications: Please see  discharge summary for a list of discharge medications.  Relevant Imaging Results:  Relevant Lab Results:   Additional Information    [place], LCSWA

## 2022-04-27 DIAGNOSIS — S72002A Fracture of unspecified part of neck of left femur, initial encounter for closed fracture: Secondary | ICD-10-CM | POA: Diagnosis not present

## 2022-04-27 LAB — CBC
HCT: 27.1 % — ABNORMAL LOW (ref 36.0–46.0)
Hemoglobin: 9.1 g/dL — ABNORMAL LOW (ref 12.0–15.0)
MCH: 31.8 pg (ref 26.0–34.0)
MCHC: 33.6 g/dL (ref 30.0–36.0)
MCV: 94.8 fL (ref 80.0–100.0)
Platelets: 243 10*3/uL (ref 150–400)
RBC: 2.86 MIL/uL — ABNORMAL LOW (ref 3.87–5.11)
RDW: 13 % (ref 11.5–15.5)
WBC: 9.7 10*3/uL (ref 4.0–10.5)
nRBC: 0 % (ref 0.0–0.2)

## 2022-04-27 NOTE — Progress Notes (Signed)
PROGRESS NOTE  MYSTERY SCHRUPP SWN:[id number] DOB: 07/31/1945 DOA: 04/22/2022 PCP: [place], MD   LOS: 5 days   Brief Narrative / Interim history: 76 year old female with a history of atrial fibrillation on anticoagulation, NPH, presents to the hospital after a fall at home.  Patient was making a turn in her kitchen when she lost her balance and fell.  She was found to have a left femoral neck fracture status postrepair on 11/24.   Subjective / 24h Interval events: Eating breakfast.  States that her pain is controlled for most part.  Agreeable to SNF  Assesement and Plan: Principal Problem:   Left displaced femoral neck fracture (HCC) Active Problems:   Fall at home, initial encounter   Paroxysmal atrial fibrillation (HCC)   Chronic anticoagulation   History of pulmonary embolus (PE)   H/O: CVA (cerebrovascular accident)   NPH (normal pressure hydrocephalus) (HCC)   Hyperlipidemia   History of left breast cancer   Tobacco use disorder   Closed subcapital fracture of left femur (HCC)   Principal problem Left femoral neck fracture secondary to fall  - Patient ambulates with use of a rolling walker at baseline.  Notes tripping and falling sometime around noon on the day of admission landing on her left hip.  Denied any loss of consciousness or trauma to her head.  She was admitted to the hospital, orthopedic surgery was consulted, and she is status post THA on 11/24 by Dr. Ninfa Linden.  SNF pending   Active problems Paroxysmal atrial fibrillation on chronic anticoagulation -Patient appears to be in a sinus rhythm.  She had a loop recorder placed in January of this year.  Readings were noted to show concern for paroxysmal atrial fibrillation for which she was started on Eliquis in August of this year by her cardiologist Dr. Einar Gip. CHA2DS2-VASc score = 5.  -Back on Eliquis, tolerating it well   History of pulmonary embolism -Patient believes she suffered the pulmonary embolism  around 2011.  Continue Eliquis   History of CVA -Patient with prior history of strokes in 2019 and 05/2021.   Normal pressure hydrocephalus -Patient at baseline, has gait instability secondary to hydrocephalus.  CT scan of the brain noted stable dilation of the ventricles. Continue outpatient follow-up with neurology  Acute blood loss anemia-postoperatively, hemoglobin now stabilized, 9.1.  No active bleeding  Hyperlipidemia -Continue atorvastatin   History of breast cancer -S/p lumpectomy followed by chemotherapy and radiation therapy in 2003.   Tobacco use disorder -Nicotine patch offered  Scheduled Meds:  acetaminophen  650 mg Oral Q6H   apixaban  5 mg Oral BID   atorvastatin  20 mg Oral Daily   docusate sodium  100 mg Oral BID   methocarbamol  500 mg Oral TID   senna  1 tablet Oral QHS   Continuous Infusions:  sodium chloride 75 mL/hr at 04/26/22 1641   methocarbamol (ROBAXIN) IV 500 mg (04/23/22 1654)   methocarbamol (ROBAXIN) IV     PRN Meds:.acetaminophen, alum & mag hydroxide-simeth, calcium carbonate, diphenhydrAMINE, HYDROcodone-acetaminophen, HYDROcodone-acetaminophen, HYDROcodone-acetaminophen, HYDROmorphone (DILAUDID) injection, menthol-cetylpyridinium **OR** phenol, methocarbamol **OR** methocarbamol (ROBAXIN) IV, metoCLOPramide **OR** metoCLOPramide (REGLAN) injection, morphine injection, ondansetron (ZOFRAN) IV, ondansetron **OR** ondansetron (ZOFRAN) IV  Current Outpatient Medications  Medication Instructions   apixaban (ELIQUIS) 5 mg, Oral, 2 times daily   atorvastatin (LIPITOR) 20 mg, Oral, Daily   CALCIUM PO 1 tablet, Oral, Daily   Cholecalciferol (VITAMIN D PO) 1 tablet, Oral, Daily   Coenzyme Q10 (CO Q 10  PO) 1 tablet, Oral, Daily   furosemide (LASIX) 20 mg, Daily   Multiple Vitamin (MULTIVITAMIN WITH MINERALS) TABS tablet 1 tablet, Oral, Daily   Multiple Vitamins-Minerals (PRESERVISION AREDS 2) CAPS Oral, 2 times daily   Omega-3 Fatty Acids (FISH OIL)  1000 MG CAPS Oral, Daily    Diet Orders (From admission, onward)     Start     Ordered   04/25/22 1106  Diet regular Room service appropriate? Yes; Fluid consistency: Thin  Diet effective now       Question Answer Comment  Room service appropriate? Yes   Fluid consistency: Thin      04/25/22 1105            DVT prophylaxis: SCDs Start: 04/25/22 1033Heparin infusion apixaban (ELIQUIS) tablet 5 mg   Lab Results  Component Value Date   PLT 243 04/27/2022      Code Status: Full Code  Family Communication: No family at bedside  Status is: Inpatient  Remains inpatient appropriate because: SNF when bed available  Level of care: Med-Surg  Consultants:  Orthopedic surgery   Objective: Vitals:   04/26/22 1210 04/26/22 2109 04/26/22 2141 04/27/22 0810  BP: (!) 120/46 (!) 127/53 (!) 130/50 (!) 137/48  Pulse: 77   84  Resp: 15   19  Temp:   98.3 F (36.8 C) 98.4 F (36.9 C)  TempSrc:   Oral Oral  SpO2: 96%  95% 93%  Weight:      Height:       No intake or output data in the 24 hours ending 04/27/22 1059  Wt Readings from Last 3 Encounters:  04/22/22 72.6 kg  04/04/22 70.1 kg  02/25/22 68 kg    Examination: Constitutional: NAD Eyes: lids and conjunctivae normal, no scleral icterus ENMT: mmm Neck: normal, supple Respiratory: clear to auscultation bilaterally, no wheezing, no crackles. Normal respiratory effort.  Cardiovascular: Regular rate and rhythm, no murmurs / rubs / gallops. No LE edema. Abdomen: soft, no distention, no tenderness. Bowel sounds positive.  Skin: no rashes Neurologic: no focal deficits, equal strength  Data Reviewed: I have independently reviewed following labs and imaging studies   CBC Recent Labs  Lab 04/22/22 1505 04/23/22 0847 04/24/22 0451 04/25/22 0036 04/26/22 0306 04/27/22 0324  WBC 9.0 12.5* 11.1* 9.7 10.4 9.7  HGB 12.9 12.1 11.6* 10.8* 9.3* 9.1*  HCT 39.0 36.4 35.8* 32.1* 27.6* 27.1*  PLT 323 281 269 262 240 243   MCV 95.4 95.0 96.5 94.4 93.6 94.8  MCH 31.5 31.6 31.3 31.8 31.5 31.8  MCHC 33.1 33.2 32.4 33.6 33.7 33.6  RDW 13.3 13.0 13.0 12.8 12.6 13.0  LYMPHSABS 1.6  --   --   --   --   --   MONOABS 0.5  --   --   --   --   --   EOSABS 0.0  --   --   --   --   --   BASOSABS 0.0  --   --   --   --   --      Recent Labs  Lab 04/22/22 1505 04/22/22 1812 04/26/22 0306  NA 139  --  137  K 4.1  --  3.9  CL 104  --  99  CO2 26  --  29  GLUCOSE 117*  --  125*  BUN 15  --  14  CREATININE 0.48  --  0.54  CALCIUM 9.2  --  8.6*  AST  --   --  39  ALT  --   --  32  ALKPHOS  --   --  58  BILITOT  --   --  0.3  ALBUMIN  --   --  2.2*  MG  --   --  1.8  INR  --  1.1  --      ------------------------------------------------------------------------------------------------------------------ No results for input(s): "CHOL", "HDL", "LDLCALC", "TRIG", "CHOLHDL", "LDLDIRECT" in the last 72 hours.  No results found for: "HGBA1C" ------------------------------------------------------------------------------------------------------------------ No results for input(s): "TSH", "T4TOTAL", "T3FREE", "THYROIDAB" in the last 72 hours.  Invalid input(s): "FREET3"  Cardiac Enzymes No results for input(s): "CKMB", "TROPONINI", "MYOGLOBIN" in the last 168 hours.  Invalid input(s): "CK" ------------------------------------------------------------------------------------------------------------------ No results found for: "BNP"  CBG: No results for input(s): "GLUCAP" in the last 168 hours.  Recent Results (from the past 240 hour(s))  Surgical pcr screen     Status: None   Collection Time: 04/25/22  4:32 AM   Specimen: Nasal Mucosa; Nasal Swab  Result Value Ref Range Status   MRSA, PCR NEGATIVE NEGATIVE Final   Staphylococcus aureus NEGATIVE NEGATIVE Final    Comment: (NOTE) The Xpert SA Assay (FDA approved for NASAL specimens in patients 27 years of age and older), is one component of a  comprehensive surveillance program. It is not intended to diagnose infection nor to guide or monitor treatment. Performed at [facility], [place] [address] [place], [place] [address]      Radiology Studies: No results found.   Marzetta Board, MD, PhD [facility]  Between 7 am - 7 pm I am available, please contact me via Amion (for emergencies) or Securechat (non urgent messages)  Between 7 pm - 7 am I am not available, please contact night coverage MD/APP via Amion

## 2022-04-27 NOTE — Progress Notes (Signed)
Subjective: 2 Days Post-Op Procedure(s) (LRB): TOTAL HIP ARTHROPLASTY ANTERIOR APPROACH (Left) Patient reports pain as moderate. Got up to the chair, but has not ambulated yet. States she has not needed pain medication to control her pain.   Objective: Vital signs in last 24 hours: Temp:  [98.3 F (36.8 C)-98.4 F (36.9 C)] 98.4 F (36.9 C) (11/26 0810) Pulse Rate:  [77-84] 84 (11/26 0810) Resp:  [15-19] 19 (11/26 0810) BP: (120-137)/(46-53) 137/48 (11/26 0810) SpO2:  [93 %-96 %] 93 % (11/26 0810)   Recent Labs    04/25/22 0036 04/26/22 0306 04/27/22 0324  HGB 10.8* 9.3* 9.1*    Recent Labs    04/26/22 0306 04/27/22 0324  WBC 10.4 9.7  RBC 2.95* 2.86*  HCT 27.6* 27.1*  PLT 240 243    Recent Labs    04/26/22 0306  NA 137  K 3.9  CL 99  CO2 29  BUN 14  CREATININE 0.54  GLUCOSE 125*  CALCIUM 8.6*    LLE: Sensation intact distally Intact pulses distally Dorsiflexion/Plantar flexion intact Incision: dressing C/D/I   Assessment/Plan: 2 Days Post-Op Procedure(s) (LRB): TOTAL HIP ARTHROPLASTY ANTERIOR APPROACH (Left) Up with therapy  Resume Eliquis    Callie Fielding 04/27/2022, 8:30 AM

## 2022-04-28 DIAGNOSIS — S72002A Fracture of unspecified part of neck of left femur, initial encounter for closed fracture: Secondary | ICD-10-CM | POA: Diagnosis not present

## 2022-04-28 MED ORDER — SIMETHICONE 80 MG PO CHEW
80.0000 mg | CHEWABLE_TABLET | Freq: Once | ORAL | Status: AC
  Administered 2022-04-28: 80 mg via ORAL
  Filled 2022-04-28: qty 1

## 2022-04-28 MED ORDER — LOPERAMIDE HCL 2 MG PO CAPS
2.0000 mg | ORAL_CAPSULE | ORAL | Status: DC | PRN
  Administered 2022-04-28: 2 mg via ORAL
  Filled 2022-04-28: qty 1

## 2022-04-28 MED ORDER — HYDROCODONE-ACETAMINOPHEN 5-325 MG PO TABS: 1.0000 | ORAL_TABLET | Freq: Four times a day (QID) | ORAL | 0 refills | Status: DC | PRN

## 2022-04-28 NOTE — Discharge Summary (Signed)
Physician Discharge Summary  [name]:[id number] DOB: June 15, 1945 DOA: 04/22/2022  PCP: Donetta Potts, MD  Admit date: 04/22/2022 Discharge date: 04/29/2022  Admitted From: home Disposition:  SNF  Recommendations for Outpatient Follow-up:  Follow up with PCP in 1-2 weeks Please obtain BMP/CBC in one week  Home Health: none Equipment/Devices: none  Discharge Condition: stable CODE STATUS: Full code Diet Orders (From admission, onward)     Start     Ordered   04/25/22 1106  Diet regular Room service appropriate? Yes; Fluid consistency: Thin  Diet effective now       Question Answer Comment  Room service appropriate? Yes   Fluid consistency: Thin      04/25/22 1105            HPI: Per admitting MD, [name] is a 76 y.o. 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  At baseline patient ambulates with use of a walker and has balance issues related to history of hydrocephalus.  She had in the kitchen when she turned to grab something off the counter and lost her balance falling onto her left hip.  Denies any loss of consciousness or trauma to her head.  She complains of severe pain out of the left hip and was unable to stand or bear weight.  Denies having any chest pain or palpitation prior to the fall.  She had been found to have intermittent episodes of paroxysmal atrial fibrillation back in August of this year for which her cardiologist Dr. Jacinto Halim she had placed her on Eliquis.  She last took Eliquis this morning.   She had not had a fall in several months. In the emergency department patient was noted to have stable vital signs.  Labs were unremarkable.  X-ray of the hip noted a mildly displaced subcapital fracture of the left femoral neck.  CT scan of the head noted no acute abnormality and stable dilation of the third and both lateral ventricles.  [facility] was consulted. Patient had been given morphine IV as needed for pain.  Hospital Course / Discharge diagnoses: Principal Problem:   Left displaced femoral neck fracture (HCC) Active Problems:   Fall at home, initial encounter   Paroxysmal atrial fibrillation (HCC)   Chronic anticoagulation   History of pulmonary embolus (PE)   H/O: CVA (cerebrovascular accident)   NPH (normal pressure hydrocephalus) (HCC)   Hyperlipidemia   History of left breast cancer   Tobacco use disorder   Closed subcapital fracture of left femur (HCC)   Principal problem Left femoral neck fracture secondary to fall  - Patient ambulates with use of a rolling walker at baseline.  Notes tripping and falling sometime around noon on the day of admission landing on her left hip.  Denied any loss of consciousness or trauma to her head.  She was admitted to the hospital, orthopedic surgery was consulted, and she is status post THA on 11/24 by Dr. Magnus Ivan. Needs SNF rehab   Active problems Paroxysmal atrial fibrillation on chronic anticoagulation -Patient appears to be in a sinus rhythm.  She had a loop recorder placed in January of this year.  Readings were noted to show concern for paroxysmal atrial fibrillation for which she was started on Eliquis in August of this year by her cardiologist Dr. Jacinto Halim. CHA2DS2-VASc score = 5. Continue anticoagulation History of pulmonary embolism -Patient believes she suffered the pulmonary embolism around 2011.  Continue Eliquis History of CVA -Patient with prior history of strokes in 2019 and 05/2021. Normal pressure hydrocephalus  -Patient at baseline, has gait instability secondary to hydrocephalus.  CT scan of the brain noted stable dilation of the ventricles. Continue outpatient follow-up with neurology Acute blood loss anemia-postoperatively, hemoglobin now stabilized, 9.1.  No active bleeding Hyperlipidemia -Continue atorvastatin History of breast cancer -S/p lumpectomy followed by chemotherapy and radiation therapy in 2003. Tobacco use disorder -counseled for cessation  Sepsis ruled out   Discharge Instructions   Allergies as of 04/29/2022       Reactions   Sulfa Antibiotics Other (See Comments)   Childhood Allergy  05/17/18 patient denies        Medication List     STOP taking these medications    furosemide 20 MG tablet Commonly known as: LASIX       TAKE these medications    apixaban 5 MG Tabs tablet Commonly known as: ELIQUIS Take 1 tablet (5 mg total) by mouth 2 (two) times daily.   atorvastatin 20 MG tablet Commonly known as: LIPITOR Take 1 tablet (20 mg total) by mouth daily.   CALCIUM PO Take 1 tablet by mouth daily.   CO Q 10 PO Take 1 tablet by mouth daily.   Fish Oil 1000 MG Caps Take by mouth daily.   HYDROcodone-acetaminophen 5-325 MG tablet Commonly known as: NORCO/VICODIN Take 1-2 tablets by mouth every 6 (six) hours as needed for moderate pain.   multivitamin with minerals Tabs tablet Take 1 tablet by mouth daily.   PreserVision AREDS 2 Caps Take by mouth 2 (two) times daily.   VITAMIN D PO Take 1 tablet by mouth daily.               Durable Medical Equipment  (From admission, onward)           Start     Ordered   04/25/22 1033  DME 3 n 1  Once        04/25/22 1032   04/25/22 1033  DME Walker rolling  Once       Question Answer Comment  Walker: With 5 Inch Wheels   Patient needs a walker to treat with the following condition Status post total replacement of left hip      04/25/22 1032            Contact information for  follow-up providers     Kathryne Hitch, MD. Schedule an appointment as soon as possible for a visit in 2 week(s).   Specialty: Orthopedic Surgery Contact information: [address] [place] [address] [phone]              Contact information for after-discharge care     Destination     [facility] Preferred SNF .   Service: Skilled Nursing Contact information: [address] [place] [address] [phone]                     Consultations: Orthopedic surgery   Procedures/Studies:  DG Pelvis Portable  Result Date: 04/25/2022 CLINICAL DATA:  LEFT  total hip arthroplasty EXAM: PORTABLE PELVIS 1-2 VIEWS COMPARISON:  Recent radiographs FINDINGS: LEFT total hip arthroplasty identified without complicating features on this single view. No acute fracture or dislocation identified. IMPRESSION: LEFT total hip arthroplasty without complicating features. Electronically Signed   By: Harmon Pier M.D.   On: 04/25/2022 10:01   DG HIP UNILAT WITH PELVIS 1V LEFT  Result Date: 04/25/2022 CLINICAL DATA:  Elective surgery.  Hip arthroplasty. EXAM: DG HIP (WITH OR WITHOUT PELVIS) 1V*L* COMPARISON:  Left hip radiographs 04/22/2022 FLUOROSCOPY: Radiation Exposure Index (as provided by the fluoroscopic device): 1.6 mGy Kerma FINDINGS: Three intraoperative spot fluoroscopic images are provided during performance of a left total hip arthroplasty. The prosthetic components appear normally positioned on these limited images, and no gross fracture is identified. IMPRESSION: Intraoperative images during left total hip arthroplasty. Electronically Signed   By: Sebastian Ache M.D.   On: 04/25/2022 08:55   DG C-Arm 1-60 Min-No Report  Result Date: 04/25/2022 Fluoroscopy was utilized by the requesting physician.  No radiographic interpretation.   CT Hip Left Wo Contrast  Result Date: 04/22/2022 CLINICAL DATA:  Hip  trauma, fracture suspected, xray done EXAM: CT OF THE LEFT HIP WITHOUT CONTRAST TECHNIQUE: Multidetector CT imaging of the left hip was performed according to the standard protocol. Multiplanar CT image reconstructions were also generated. RADIATION DOSE REDUCTION: This exam was performed according to the departmental dose-optimization program which includes automated exposure control, adjustment of the mA and/or kV according to patient size and/or use of iterative reconstruction technique. COMPARISON:  Radiograph 04/22/2022 FINDINGS: Bones/Joint/Cartilage There is a displaced subcapital/transcervical femoral neck fracture with impaction and angulation. Mild left hip osteoarthritis. Mild left SI joint degenerative change. Small joint effusion. Ligaments Suboptimally assessed by CT. Muscles and Tendons No acute myotendinous abnormality by CT. Soft tissues Adjacent soft tissue swelling. IMPRESSION: Acute displaced left femoral neck fracture. Electronically Signed   By: Caprice Renshaw M.D.   On: 04/22/2022 15:56   CT Head Wo Contrast  Result Date: 04/22/2022 CLINICAL DATA:  Trauma, fall EXAM: CT HEAD WITHOUT CONTRAST TECHNIQUE: Contiguous axial images were obtained from the base of the skull through the vertex without intravenous contrast. RADIATION DOSE REDUCTION: This exam was performed according to the departmental dose-optimization program which includes automated exposure control, adjustment of the mA and/or kV according to patient size and/or use of iterative reconstruction technique. COMPARISON:  03/27/2021 FINDINGS: Brain: No acute intracranial findings are seen. There is dilation of third and both lateral ventricles with no significant interval change. There is no shift of midline structures. There is decreased density in periventricular white matter. Ventricular dilation is out of proportion to cortical atrophy. Vascular: Unremarkable. Skull: Unremarkable. Sinuses/Orbits: Unremarkable. Other: None.  IMPRESSION: No acute intracranial findings are seen There is dilation of third and both lateral ventricles with no significant change. Small-vessel disease. Electronically Signed   By: Ernie Avena M.D.   On: 04/22/2022 15:56   DG Hip Unilat W or Wo Pelvis 2-3 Views Left  Result Date: 04/22/2022 CLINICAL DATA:  Fall.  Left hip pain. EXAM: DG HIP (WITH OR WITHOUT PELVIS) 2-3V LEFT COMPARISON:  Left hip radiographs 01/01/2016. FINDINGS: The bones are diffusely demineralized. There is a mildly displaced subcapital fracture of the left femoral neck. No dislocation or pelvic fracture identified. Stable mild degenerative changes of both hips and sacroiliac joints. IMPRESSION: Mildly displaced subcapital fracture of the left femoral neck. Electronically Signed   By: Carey Bullocks M.D.   On: 04/22/2022 14:57  Subjective: - no chest pain, shortness of breath, no abdominal pain, nausea or vomiting.   Discharge Exam: BP (!) 113/52 (BP Location: Right Arm)   Pulse 81   Temp 98 F (36.7 C) (Oral)   Resp 18   Ht 5\' 8"  (1.727 m)   Wt 72.6 kg   SpO2 94%   BMI 24.33 kg/m   Constitutional: NAD Respiratory: clear to auscultation bilaterally, no wheezing, no crackles. Normal respiratory effort.  Cardiovascular: Regular rate and rhythm, no murmurs / rubs / gallops. No LE edema. Neurologic: no focal deficits, equal strength   The results of significant diagnostics from this hospitalization (including imaging, microbiology, ancillary and laboratory) are listed below for reference.     Microbiology: Recent Results (from the past 240 hour(s))  Surgical pcr screen     Status: None   Collection Time: 04/25/22  4:32 AM   Specimen: Nasal Mucosa; Nasal Swab  Result Value Ref Range Status   MRSA, PCR NEGATIVE NEGATIVE Final   Staphylococcus aureus NEGATIVE NEGATIVE Final    Comment: (NOTE) The Xpert SA Assay (FDA approved for NASAL specimens in patients 55 years of age and older), is one  component of a comprehensive surveillance program. It is not intended to diagnose infection nor to guide or monitor treatment. Performed at [facility], [address], [place], [place] [address]      Labs: Basic Metabolic Panel: Recent Labs  Lab 04/22/22 1505 04/26/22 0306  NA 139 137  K 4.1 3.9  CL 104 99  CO2 26 29  GLUCOSE 117* 125*  BUN 15 14  CREATININE 0.48 0.54  CALCIUM 9.2 8.6*  MG  --  1.8   Liver Function Tests: Recent Labs  Lab 04/26/22 0306  AST 39  ALT 32  ALKPHOS 58  BILITOT 0.3  PROT 5.0*  ALBUMIN 2.2*   CBC: Recent Labs  Lab 04/22/22 1505 04/23/22 0847 04/24/22 0451 04/25/22 0036 04/26/22 0306 04/27/22 0324  WBC 9.0 12.5* 11.1* 9.7 10.4 9.7  NEUTROABS 6.9  --   --   --   --   --   HGB 12.9 12.1 11.6* 10.8* 9.3* 9.1*  HCT 39.0 36.4 35.8* 32.1* 27.6* 27.1*  MCV 95.4 95.0 96.5 94.4 93.6 94.8  PLT 323 281 269 262 240 243   CBG: No results for input(s): "GLUCAP" in the last 168 hours. Hgb A1c No results for input(s): "HGBA1C" in the last 72 hours. Lipid Profile No results for input(s): "CHOL", "HDL", "LDLCALC", "TRIG", "CHOLHDL", "LDLDIRECT" in the last 72 hours. Thyroid function studies No results for input(s): "TSH", "T4TOTAL", "T3FREE", "THYROIDAB" in the last 72 hours.  Invalid input(s): "FREET3" Urinalysis    Component Value Date/Time   COLORURINE YELLOW 03/07/2019 1440   APPEARANCEUR CLEAR 03/07/2019 1440   LABSPEC 1.025 03/07/2019 1440   PHURINE 5.5 03/07/2019 1440   GLUCOSEU NEGATIVE 03/07/2019 1440   HGBUR NEGATIVE 03/07/2019 1440   BILIRUBINUR NEGATIVE 11/18/2007 1824   KETONESUR NEGATIVE 03/07/2019 1440   PROTEINUR NEGATIVE 03/07/2019 1440   UROBILINOGEN 0.2 11/18/2007 1824   NITRITE NEGATIVE 03/07/2019 1440   LEUKOCYTESUR NEGATIVE 03/07/2019 1440    FURTHER DISCHARGE INSTRUCTIONS:   Get Medicines reviewed and adjusted: Please take all your medications with you for your next visit with your Primary MD    Laboratory/radiological data: Please request your Primary MD to go over all hospital tests and procedure/radiological results at the follow up, please ask your Primary MD to get all Hospital records sent to his/her  office.   In some cases, they will be blood work, cultures and biopsy results pending at the time of your discharge. Please request that your primary care M.D. goes through all the records of your hospital data and follows up on these results.   Also Note the following: If you experience worsening of your admission symptoms, develop shortness of breath, life threatening emergency, suicidal or homicidal thoughts you must seek medical attention immediately by calling 911 or calling your MD immediately  if symptoms less severe.   You must read complete instructions/literature along with all the possible adverse reactions/side effects for all the Medicines you take and that have been prescribed to you. Take any new Medicines after you have completely understood and accpet all the possible adverse reactions/side effects.    Do not drive when taking Pain medications or sleeping medications (Benzodaizepines)   Do not take more than prescribed Pain, Sleep and Anxiety Medications. It is not advisable to combine anxiety,sleep and pain medications without talking with your primary care practitioner   Special Instructions: If you have smoked or chewed Tobacco  in the last 2 yrs please stop smoking, stop any regular Alcohol  and or any Recreational drug use.   Wear Seat belts while driving.   Please note: You were cared for by a hospitalist during your hospital stay. Once you are discharged, your primary care physician will handle any further medical issues. Please note that NO REFILLS for any discharge medications will be authorized once you are discharged, as it is imperative that you return to your primary care physician (or establish a relationship with a primary care physician if you do not  have one) for your post hospital discharge needs so that they can reassess your need for medications and monitor your lab values.  Time coordinating discharge: 35 minutes  SIGNED:  Pamella Pert, MD, PhD 04/29/2022, 8:46 AM

## 2022-04-28 NOTE — Plan of Care (Signed)
  Problem: Clinical Measurements: Goal: Will remain free from infection Outcome: Progressing Goal: Diagnostic test results will improve Outcome: Progressing   Problem: Elimination: Goal: Will not experience complications related to bowel motility Outcome: Progressing Goal: Will not experience complications related to urinary retention Outcome: Progressing   Problem: Pain Managment: Goal: General experience of comfort will improve Outcome: Progressing

## 2022-04-28 NOTE — TOC Progression Note (Addendum)
Transition of Care [facility]) - Progression Note    Patient Details  Name: IRMALEE RIEMENSCHNEIDER MRN: [id number] Date of Birth: 24-Sep-1945  Transition of Care [facility]) CM/SW Contact  Joanne Chars, LCSW Phone Number: 04/28/2022, 11:29 AM  Clinical Narrative:  Bed offers presented to pt and husband.  Pt confirmed she is not vaccinated for covid, [facility] declined for this reason.  They are requesting response from [place] reached out to [place].   1320: Bed offer made by [facility].  CSW spoke with pt and husband and they accept.  CSW confirmed with [facility].  They will have bed available tomorrow for admission.   1445: auth request submitted and approved in [place]: [id number], 3 days: 11/28-11/30.   Expected Discharge Plan: [place] Barriers to Discharge: Continued Medical Work up  Expected Discharge Plan and Services Expected Discharge Plan: [place] Choice: [place] arrangements for the past 2 months: Single Family Home                                       Social Determinants of Health (SDOH) Interventions    Readmission Risk Interventions     No data to display

## 2022-04-28 NOTE — Progress Notes (Signed)
PROGRESS NOTE  [name] JJO:[id number] DOB: 12-15-1945 DOA: 04/22/2022 PCP: [place], MD   LOS: 6 days   Brief Narrative / Interim history: 76 year old female with a history of atrial fibrillation on anticoagulation, NPH, presents to the hospital after a fall at home.  Patient was making a turn in her kitchen when she lost her balance and fell.  She was found to have a left femoral neck fracture status postrepair on 11/24.   Subjective / 24h Interval events: Stable to go to SNF. Waiting on insurance  Assesement and Plan: Principal Problem:   Left displaced femoral neck fracture (HCC) Active Problems:   Fall at home, initial encounter   Paroxysmal atrial fibrillation (HCC)   Chronic anticoagulation   History of pulmonary embolus (PE)   H/O: CVA (cerebrovascular accident)   NPH (normal pressure hydrocephalus) (HCC)   Hyperlipidemia   History of left breast cancer   Tobacco use disorder   Closed subcapital fracture of left femur ([place])   Principal problem Left femoral neck fracture secondary to fall  - Patient ambulates with use of a rolling walker at baseline.  Notes tripping and falling sometime around noon on the day of admission landing on her left hip.  Denied any loss of consciousness or trauma to her head.  She was admitted to the hospital, orthopedic surgery was consulted, and she is status post THA on 11/24 by Dr. Ninfa Linden.  SNF pending   Active problems Paroxysmal atrial fibrillation on chronic anticoagulation -Patient appears to be in a sinus rhythm.  She had a loop recorder placed in January of this year.  Readings were noted to show concern for paroxysmal atrial fibrillation for which she was started on Eliquis in August of this year by her cardiologist Dr. Einar Gip. CHA2DS2-VASc score = 5.  -Back on Eliquis, tolerating it well   History of pulmonary embolism -Patient believes she suffered the pulmonary embolism around 2011.  Continue Eliquis   History of  CVA -Patient with prior history of strokes in 2019 and 05/2021.   Normal pressure hydrocephalus -Patient at baseline, has gait instability secondary to hydrocephalus.  CT scan of the brain noted stable dilation of the ventricles. Continue outpatient follow-up with neurology  Acute blood loss anemia-postoperatively, hemoglobin now stabilized, 9.1.  No active bleeding  Hyperlipidemia -Continue atorvastatin   History of breast cancer -S/p lumpectomy followed by chemotherapy and radiation therapy in 2003.   Tobacco use disorder -Nicotine patch offered  Scheduled Meds:  acetaminophen  650 mg Oral Q6H   apixaban  5 mg Oral BID   atorvastatin  20 mg Oral Daily   methocarbamol  500 mg Oral TID   Continuous Infusions:  sodium chloride 75 mL/hr at 04/26/22 1641   methocarbamol (ROBAXIN) IV 500 mg (04/23/22 1654)   methocarbamol (ROBAXIN) IV     PRN Meds:.acetaminophen, alum & mag hydroxide-simeth, calcium carbonate, diphenhydrAMINE, HYDROcodone-acetaminophen, HYDROcodone-acetaminophen, HYDROcodone-acetaminophen, HYDROmorphone (DILAUDID) injection, loperamide, menthol-cetylpyridinium **OR** phenol, methocarbamol **OR** methocarbamol (ROBAXIN) IV, metoCLOPramide **OR** metoCLOPramide (REGLAN) injection, morphine injection, ondansetron (ZOFRAN) IV, ondansetron **OR** ondansetron (ZOFRAN) IV  Current Outpatient Medications  Medication Instructions   apixaban (ELIQUIS) 5 mg, Oral, 2 times daily   atorvastatin (LIPITOR) 20 mg, Oral, Daily   CALCIUM PO 1 tablet, Oral, Daily   Cholecalciferol (VITAMIN D PO) 1 tablet, Oral, Daily   Coenzyme Q10 (CO Q 10 PO) 1 tablet, Oral, Daily   furosemide (LASIX) 20 mg, Daily   HYDROcodone-acetaminophen (NORCO/VICODIN) 5-325 MG tablet 1-2 tablets, Oral, Every 6  hours PRN   Multiple Vitamin (MULTIVITAMIN WITH MINERALS) TABS tablet 1 tablet, Oral, Daily   Multiple Vitamins-Minerals (PRESERVISION AREDS 2) CAPS Oral, 2 times daily   Omega-3 Fatty Acids (FISH OIL)  1000 MG CAPS Oral, Daily    Diet Orders (From admission, onward)     Start     Ordered   04/25/22 1106  Diet regular Room service appropriate? Yes; Fluid consistency: Thin  Diet effective now       Question Answer Comment  Room service appropriate? Yes   Fluid consistency: Thin      04/25/22 1105            DVT prophylaxis: SCDs Start: 04/25/22 1033Heparin infusion apixaban (ELIQUIS) tablet 5 mg   Lab Results  Component Value Date   PLT 243 04/27/2022      Code Status: Full Code  Family Communication: No family at bedside  Status is: Inpatient  Remains inpatient appropriate because: SNF when bed available  Level of care: Med-Surg  Consultants:  Orthopedic surgery   Objective: Vitals:   04/27/22 0810 04/27/22 2117 04/28/22 0838 04/28/22 1407  BP: (!) 137/48 129/61 (!) 155/62 (!) 131/57  Pulse: 84 70 76 90  Resp: '19 18 17 17  '$ Temp: 98.4 F (36.9 C) 98.1 F (36.7 C) 97.9 F (36.6 C) 98.4 F (36.9 C)  TempSrc: Oral Oral Oral Oral  SpO2: 93% 96% 93% 92%  Weight:      Height:       No intake or output data in the 24 hours ending 04/28/22 1510  Wt Readings from Last 3 Encounters:  04/22/22 72.6 kg  04/04/22 70.1 kg  02/25/22 68 kg    Examination: Constitutional: NAD Respiratory: CTA Cardiovascular: RRR   Data Reviewed: I have independently reviewed following labs and imaging studies   CBC Recent Labs  Lab 04/22/22 1505 04/23/22 0847 04/24/22 0451 04/25/22 0036 04/26/22 0306 04/27/22 0324  WBC 9.0 12.5* 11.1* 9.7 10.4 9.7  HGB 12.9 12.1 11.6* 10.8* 9.3* 9.1*  HCT 39.0 36.4 35.8* 32.1* 27.6* 27.1*  PLT 323 281 269 262 240 243  MCV 95.4 95.0 96.5 94.4 93.6 94.8  MCH 31.5 31.6 31.3 31.8 31.5 31.8  MCHC 33.1 33.2 32.4 33.6 33.7 33.6  RDW 13.3 13.0 13.0 12.8 12.6 13.0  LYMPHSABS 1.6  --   --   --   --   --   MONOABS 0.5  --   --   --   --   --   EOSABS 0.0  --   --   --   --   --   BASOSABS 0.0  --   --   --   --   --      Recent Labs   Lab 04/22/22 1505 04/22/22 1812 04/26/22 0306  NA 139  --  137  K 4.1  --  3.9  CL 104  --  99  CO2 26  --  29  GLUCOSE 117*  --  125*  BUN 15  --  14  CREATININE 0.48  --  0.54  CALCIUM 9.2  --  8.6*  AST  --   --  39  ALT  --   --  32  ALKPHOS  --   --  58  BILITOT  --   --  0.3  ALBUMIN  --   --  2.2*  MG  --   --  1.8  INR  --  1.1  --      ------------------------------------------------------------------------------------------------------------------  No results for input(s): "CHOL", "HDL", "LDLCALC", "TRIG", "CHOLHDL", "LDLDIRECT" in the last 72 hours.  No results found for: "HGBA1C" ------------------------------------------------------------------------------------------------------------------ No results for input(s): "TSH", "T4TOTAL", "T3FREE", "THYROIDAB" in the last 72 hours.  Invalid input(s): "FREET3"  Cardiac Enzymes No results for input(s): "CKMB", "TROPONINI", "MYOGLOBIN" in the last 168 hours.  Invalid input(s): "CK" ------------------------------------------------------------------------------------------------------------------ No results found for: "BNP"  CBG: No results for input(s): "GLUCAP" in the last 168 hours.  Recent Results (from the past 240 hour(s))  Surgical pcr screen     Status: None   Collection Time: 04/25/22  4:32 AM   Specimen: Nasal Mucosa; Nasal Swab  Result Value Ref Range Status   MRSA, PCR NEGATIVE NEGATIVE Final   Staphylococcus aureus NEGATIVE NEGATIVE Final    Comment: (NOTE) The Xpert SA Assay (FDA approved for NASAL specimens in patients 36 years of age and older), is one component of a comprehensive surveillance program. It is not intended to diagnose infection nor to guide or monitor treatment. Performed at [facility], [place] [address], [place], [place] [address]      Radiology Studies: No results found.   Marzetta Board, MD, PhD [facility]  Between 7 am - 7 pm I am available, please  contact me via Amion (for emergencies) or Securechat (non urgent messages)  Between 7 pm - 7 am I am not available, please contact night coverage MD/APP via Amion

## 2022-04-28 NOTE — Progress Notes (Signed)
Subjective: 3 Days Post-Op Procedure(s) (LRB): TOTAL HIP ARTHROPLASTY ANTERIOR APPROACH (Left) Patient reports pain as moderate.  Short-term SNF has been recommended.  Patient reports minimal mobility.  Does say she has diarrhea.  Objective: Vital signs in last 24 hours: Temp:  [98.1 F (36.7 C)-98.4 F (36.9 C)] 98.1 F (36.7 C) (11/26 2117) Pulse Rate:  [70-84] 70 (11/26 2117) Resp:  [18-19] 18 (11/26 2117) BP: (129-137)/(48-61) 129/61 (11/26 2117) SpO2:  [93 %-96 %] 96 % (11/26 2117)  Intake/Output from previous day: 11/26 0701 - 11/27 0700 In: 360 [P.O.:360] Out: -  Intake/Output this shift: No intake/output data recorded.  Recent Labs    04/26/22 0306 04/27/22 0324  HGB 9.3* 9.1*   Recent Labs    04/26/22 0306 04/27/22 0324  WBC 10.4 9.7  RBC 2.95* 2.86*  HCT 27.6* 27.1*  PLT 240 243   Recent Labs    04/26/22 0306  NA 137  K 3.9  CL 99  CO2 29  BUN 14  CREATININE 0.54  GLUCOSE 125*  CALCIUM 8.6*   No results for input(s): "LABPT", "INR" in the last 72 hours.  Sensation intact distally Intact pulses distally Dorsiflexion/Plantar flexion intact Incision: dressing C/D/I   Assessment/Plan: 3 Days Post-Op Procedure(s) (LRB): TOTAL HIP ARTHROPLASTY ANTERIOR APPROACH (Left) Up with therapy Short-term SNF when medically stable; ok from Ortho standpoint. Back on her home dose of Eliquis     Mcarthur Rossetti 04/28/2022, 7:43 AM

## 2022-04-28 NOTE — Discharge Summary (Incomplete Revision)
Physician Discharge Summary  [name] YHC:[id number] DOB: April 20, 1946 DOA: 04/22/2022  PCP: Sterlington Nation, MD  Admit date: 04/22/2022 Discharge date: 04/28/2022  Admitted From: home Disposition:  SNF  Recommendations for Outpatient Follow-up:  Follow up with PCP in 1-2 weeks Please obtain BMP/CBC in one week  Home Health: none Equipment/Devices: none  Discharge Condition: stable CODE STATUS: Full code Diet Orders (From admission, onward)     Start     Ordered   04/25/22 1106  Diet regular Room service appropriate? Yes; Fluid consistency: Thin  Diet effective now       Question Answer Comment  Room service appropriate? Yes   Fluid consistency: Thin      04/25/22 1105            HPI: Per admitting MD, [name] is a 76 y.o. 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  At baseline patient ambulates with use of a walker and has balance issues related to history of hydrocephalus.  She had in the kitchen when she turned to grab something off the counter and lost her balance falling onto her left hip.  Denies any loss of consciousness or trauma to her head.  She complains of severe pain out of the left hip and was unable to stand or bear weight.  Denies having any chest pain or palpitation prior to the fall.  She had been found to have intermittent episodes of paroxysmal atrial fibrillation back in August of this year for which her cardiologist Dr. Einar Gip she had placed her on Eliquis.  She last took Eliquis this morning.   She had not had a fall in several months. In the emergency department patient was noted to have stable vital signs.  Labs were unremarkable.  X-ray of the hip noted a mildly displaced subcapital fracture of the left femoral neck.  CT scan of the head noted no acute abnormality and stable dilation of the third and both lateral ventricles.  The [place] of orthopedics was consulted. Patient had been given morphine IV as needed for pain.  Hospital Course / Discharge diagnoses: Principal Problem:   Left displaced femoral neck fracture (HCC) Active Problems:   Fall at home, initial encounter   Paroxysmal atrial fibrillation (HCC)   Chronic anticoagulation   History of pulmonary embolus (PE)   H/O: CVA (cerebrovascular accident)   NPH (normal pressure hydrocephalus) (HCC)   Hyperlipidemia   History of left breast cancer   Tobacco use disorder   Closed subcapital fracture of left femur ([place])   Principal problem Left femoral neck fracture secondary to fall  - Patient ambulates with use of a rolling walker at baseline.  Notes tripping and falling sometime around noon on the day of admission landing on her left hip.  Denied any loss of consciousness or trauma to her head.  She was admitted to the hospital, orthopedic surgery was consulted, and she is status post THA on 11/24 by Dr. Ninfa Linden. Needs SNF rehab   Active problems Paroxysmal atrial fibrillation on chronic anticoagulation -Patient appears to be in a sinus rhythm.  She had a loop recorder placed in January of this year.  Readings were noted to show concern for paroxysmal atrial fibrillation for which she was started on Eliquis in August of this year by her cardiologist Dr. Einar Gip. CHA2DS2-VASc score = 5. Continue anticoagulation History of pulmonary embolism -Patient believes she suffered the pulmonary embolism around 2011.  Continue Eliquis History of CVA -Patient with prior history of strokes in 2019 and 05/2021. Normal pressure hydrocephalus  -Patient at baseline, has gait instability secondary to hydrocephalus.  CT scan of the brain noted stable dilation of the ventricles. Continue outpatient follow-up with neurology Acute blood loss anemia-postoperatively, hemoglobin now stabilized, 9.1.  No active bleeding Hyperlipidemia -Continue atorvastatin History of breast cancer -S/p lumpectomy followed by chemotherapy and radiation therapy in 2003. Tobacco use disorder -counseled for cessation  Sepsis ruled out   Discharge Instructions   Allergies as of 04/28/2022       Reactions   Sulfa Antibiotics Other (See Comments)   Childhood Allergy  05/17/18 patient denies        Medication List     STOP taking these medications    furosemide 20 MG tablet Commonly known as: LASIX       TAKE these medications    apixaban 5 MG Tabs tablet Commonly known as: ELIQUIS Take 1 tablet (5 mg total) by mouth 2 (two) times daily.   atorvastatin 20 MG tablet Commonly known as: LIPITOR Take 1 tablet (20 mg total) by mouth daily.   CALCIUM PO Take 1 tablet by mouth daily.   CO Q 10 PO Take 1 tablet by mouth daily.   Fish Oil 1000 MG Caps Take by mouth daily.   HYDROcodone-acetaminophen 5-325 MG tablet Commonly known as: NORCO/VICODIN Take 1-2 tablets by mouth every 6 (six) hours as needed for moderate pain.   multivitamin with minerals Tabs tablet Take 1 tablet by mouth daily.   PreserVision AREDS 2 Caps Take by mouth 2 (two) times daily.   VITAMIN D PO Take 1 tablet by mouth daily.               Durable Medical Equipment  (From admission, onward)           Start     Ordered   04/25/22 1033  DME 3 n 1  Once        04/25/22 1032   04/25/22 1033  DME Walker rolling  Once       Question Answer Comment  Walker: With 5 Inch Wheels   Patient needs a walker to treat with the following condition Status post total replacement of left hip      04/25/22 1032            Follow-up Information      Mcarthur Rossetti, MD. Schedule an appointment as soon as possible for a visit in 2 week(s).   Specialty: Orthopedic Surgery Contact information: [address] [place] [address] [phone]                 Consultations: Orthopedic surgery   Procedures/Studies:  DG Pelvis Portable  Result Date: 04/25/2022 CLINICAL DATA:  LEFT total hip arthroplasty EXAM: PORTABLE PELVIS 1-2 VIEWS COMPARISON:  Recent radiographs FINDINGS: LEFT total hip arthroplasty identified without complicating features on this single view. No acute fracture or dislocation identified. IMPRESSION: LEFT total hip arthroplasty without complicating features. Electronically Signed   By: Margarette Canada M.D.   On: 04/25/2022 10:01   DG HIP UNILAT WITH PELVIS  1V LEFT  Result Date: 04/25/2022 CLINICAL DATA:  Elective surgery.  Hip arthroplasty. EXAM: DG HIP (WITH OR WITHOUT PELVIS) 1V*L* COMPARISON:  Left hip radiographs 04/22/2022 FLUOROSCOPY: Radiation Exposure Index (as provided by the fluoroscopic device): 1.6 mGy Kerma FINDINGS: Three intraoperative spot fluoroscopic images are provided during performance of a left total hip arthroplasty. The prosthetic components appear normally positioned on these limited images, and no gross fracture is identified. IMPRESSION: Intraoperative images during left total hip arthroplasty. Electronically Signed   By: Logan Bores M.D.   On: 04/25/2022 08:55   DG C-Arm 1-60 Min-No Report  Result Date: 04/25/2022 Fluoroscopy was utilized by the requesting physician.  No radiographic interpretation.   CT Hip Left Wo Contrast  Result Date: 04/22/2022 CLINICAL DATA:  Hip trauma, fracture suspected, xray done EXAM: CT OF THE LEFT HIP WITHOUT CONTRAST TECHNIQUE: Multidetector CT imaging of the left hip was performed according to the standard protocol. Multiplanar CT image reconstructions were also generated. RADIATION DOSE REDUCTION: This exam was performed according to the  departmental dose-optimization program which includes automated exposure control, adjustment of the mA and/or kV according to patient size and/or use of iterative reconstruction technique. COMPARISON:  Radiograph 04/22/2022 FINDINGS: Bones/Joint/Cartilage There is a displaced subcapital/transcervical femoral neck fracture with impaction and angulation. Mild left hip osteoarthritis. Mild left SI joint degenerative change. Small joint effusion. Ligaments Suboptimally assessed by CT. Muscles and Tendons No acute myotendinous abnormality by CT. Soft tissues Adjacent soft tissue swelling. IMPRESSION: Acute displaced left femoral neck fracture. Electronically Signed   By: Maurine Simmering M.D.   On: 04/22/2022 15:56   CT Head Wo Contrast  Result Date: 04/22/2022 CLINICAL DATA:  Trauma, fall EXAM: CT HEAD WITHOUT CONTRAST TECHNIQUE: Contiguous axial images were obtained from the base of the skull through the vertex without intravenous contrast. RADIATION DOSE REDUCTION: This exam was performed according to the departmental dose-optimization program which includes automated exposure control, adjustment of the mA and/or kV according to patient size and/or use of iterative reconstruction technique. COMPARISON:  03/27/2021 FINDINGS: Brain: No acute intracranial findings are seen. There is dilation of third and both lateral ventricles with no significant interval change. There is no shift of midline structures. There is decreased density in periventricular white matter. Ventricular dilation is out of proportion to cortical atrophy. Vascular: Unremarkable. Skull: Unremarkable. Sinuses/Orbits: Unremarkable. Other: None. IMPRESSION: No acute intracranial findings are seen There is dilation of third and both lateral ventricles with no significant change. Small-vessel disease. Electronically Signed   By: Elmer Picker M.D.   On: 04/22/2022 15:56   DG Hip Unilat W or Wo Pelvis 2-3 Views Left  Result Date:  04/22/2022 CLINICAL DATA:  Fall.  Left hip pain. EXAM: DG HIP (WITH OR WITHOUT PELVIS) 2-3V LEFT COMPARISON:  Left hip radiographs 01/01/2016. FINDINGS: The bones are diffusely demineralized. There is a mildly displaced subcapital fracture of the left femoral neck. No dislocation or pelvic fracture identified. Stable mild degenerative changes of both hips and sacroiliac joints. IMPRESSION: Mildly displaced subcapital fracture of the left femoral neck. Electronically Signed   By: Richardean Sale M.D.   On: 04/22/2022 14:57     Subjective: - no chest pain, shortness of breath, no abdominal pain, nausea or vomiting.   Discharge Exam: BP (!) 155/62 (BP Location: Right Arm)   Pulse 76   Temp 97.9 F (36.6 C) (Oral)   Resp 17   Ht '5\' 8"'$  (1.727 m)   Wt 72.6 kg   SpO2 93%  BMI 24.33 kg/m   General: Pt is alert, awake, not in acute distress Cardiovascular: RRR, S1/S2 +, no rubs, no gallops Respiratory: CTA bilaterally, no wheezing, no rhonchi Abdominal: Soft, NT, ND, bowel sounds + Extremities: no edema, no cyanosis   The results of significant diagnostics from this hospitalization (including imaging, microbiology, ancillary and laboratory) are listed below for reference.     Microbiology: Recent Results (from the past 240 hour(s))  Surgical pcr screen     Status: None   Collection Time: 04/25/22  4:32 AM   Specimen: Nasal Mucosa; Nasal Swab  Result Value Ref Range Status   MRSA, PCR NEGATIVE NEGATIVE Final   Staphylococcus aureus NEGATIVE NEGATIVE Final    Comment: (NOTE) The Xpert SA Assay (FDA approved for NASAL specimens in patients 84 years of age and older), is one component of a comprehensive surveillance program. It is not intended to diagnose infection nor to guide or monitor treatment. Performed at [facility], [place] [address], [place], [place] [address]      Labs: Basic Metabolic Panel: Recent Labs  Lab 04/22/22 1505 04/26/22 0306  NA 139 137  K 4.1 3.9   CL 104 99  CO2 26 29  GLUCOSE 117* 125*  BUN 15 14  CREATININE 0.48 0.54  CALCIUM 9.2 8.6*  MG  --  1.8   Liver Function Tests: Recent Labs  Lab 04/26/22 0306  AST 39  ALT 32  ALKPHOS 58  BILITOT 0.3  PROT 5.0*  ALBUMIN 2.2*   CBC: Recent Labs  Lab 04/22/22 1505 04/23/22 0847 04/24/22 0451 04/25/22 0036 04/26/22 0306 04/27/22 0324  WBC 9.0 12.5* 11.1* 9.7 10.4 9.7  NEUTROABS 6.9  --   --   --   --   --   HGB 12.9 12.1 11.6* 10.8* 9.3* 9.1*  HCT 39.0 36.4 35.8* 32.1* 27.6* 27.1*  MCV 95.4 95.0 96.5 94.4 93.6 94.8  PLT 323 281 269 262 240 243   CBG: No results for input(s): "GLUCAP" in the last 168 hours. Hgb A1c No results for input(s): "HGBA1C" in the last 72 hours. Lipid Profile No results for input(s): "CHOL", "HDL", "LDLCALC", "TRIG", "CHOLHDL", "LDLDIRECT" in the last 72 hours. Thyroid function studies No results for input(s): "TSH", "T4TOTAL", "T3FREE", "THYROIDAB" in the last 72 hours.  Invalid input(s): "FREET3" Urinalysis    Component Value Date/Time   COLORURINE YELLOW 03/07/2019 1440   APPEARANCEUR CLEAR 03/07/2019 1440   LABSPEC 1.025 03/07/2019 1440   PHURINE 5.5 03/07/2019 1440   GLUCOSEU NEGATIVE 03/07/2019 1440   [place] 03/07/2019 [place] 11/18/2007 1824   KETONESUR NEGATIVE 03/07/2019 1440   PROTEINUR NEGATIVE 03/07/2019 1440   UROBILINOGEN 0.2 11/18/2007 1824   NITRITE NEGATIVE 03/07/2019 1440   LEUKOCYTESUR NEGATIVE 03/07/2019 1440    FURTHER DISCHARGE INSTRUCTIONS:   Get Medicines reviewed and adjusted: Please take all your medications with you for your next visit with your Primary MD   Laboratory/radiological data: Please request your Primary MD to go over all hospital tests and procedure/radiological results at the follow up, please ask your Primary MD to get all Hospital records sent to his/her office.   In some cases, they will be blood work, cultures and biopsy results pending at the time of  your discharge. Please request that your primary care M.D. goes through all the records of your hospital data and follows up on these results.   Also Note the following: If you experience worsening of your admission symptoms,  develop shortness of breath, life threatening emergency, suicidal or homicidal thoughts you must seek medical attention immediately by calling 911 or calling your MD immediately  if symptoms less severe.   You must read complete instructions/literature along with all the possible adverse reactions/side effects for all the Medicines you take and that have been prescribed to you. Take any new Medicines after you have completely understood and accpet all the possible adverse reactions/side effects.    Do not drive when taking Pain medications or sleeping medications (Benzodaizepines)   Do not take more than prescribed Pain, Sleep and Anxiety Medications. It is not advisable to combine anxiety,sleep and pain medications without talking with your primary care practitioner   Special Instructions: If you have smoked or chewed Tobacco  in the last 2 yrs please stop smoking, stop any regular Alcohol  and or any Recreational drug use.   Wear Seat belts while driving.   Please note: You were cared for by a hospitalist during your hospital stay. Once you are discharged, your primary care physician will handle any further medical issues. Please note that NO REFILLS for any discharge medications will be authorized once you are discharged, as it is imperative that you return to your primary care physician (or establish a relationship with a primary care physician if you do not have one) for your post hospital discharge needs so that they can reassess your need for medications and monitor your lab values.  Time coordinating discharge: 35 minutes  SIGNED:  Marzetta Board, MD, PhD 04/28/2022, 12:06 PM

## 2022-04-28 NOTE — Progress Notes (Signed)
Physical Therapy Treatment Patient Details Name: [name] MRN: [id number] DOB: [birth year] Today's Date: 04/28/2022   History of Present Illness Pt is 76 yo female who presents after a fall at home on 04/22/22 with a displaced femoral neck fx. Pt underwent L THA. PMH: PE, TIA, cerebellar CVA, NPH, chronic back pain due to spina bifida with gait instability, COPD, Afib, smoker.    PT Comments    Pt tolerating therapy better today and less lightheaded when upright. Pt came to EOB with mod A +2 and stood to stedy with mod A +2. Worked on sit>stand from flaps of stedy as well as increasing standing tolerance for gait and achieving erect posture. Pt performed seated there ex but her neck began hurting while doing exercises and could not continue. Blanket roll given to pt to better support neck which relieved discomfort somewhat. Rec SNF at d/c. PT will continue to follow.    Recommendations for follow up therapy are one component of a multi-disciplinary discharge planning process, led by the attending physician.  Recommendations may be updated based on patient status, additional functional criteria and insurance authorization.  Follow Up Recommendations  Skilled nursing-short term rehab (<3 hours/day) Can patient physically be transported by private vehicle: No   Assistance Recommended at Discharge Frequent or constant Supervision/Assistance  Patient can return home with the following Two people to help with walking and/or transfers;Two people to help with bathing/dressing/bathroom;Assistance with cooking/housework;Assist for transportation;Help with stairs or ramp for entrance   Equipment Recommendations  None recommended by PT    Recommendations for Other Services       Precautions / Restrictions Precautions Precautions: Fall Precaution Comments: history of falls but none recently Restrictions Weight Bearing Restrictions: Yes LLE Weight Bearing: Weight bearing as tolerated      Mobility  Bed Mobility Overal bed mobility: Needs Assistance Bed Mobility: Supine to Sit, Rolling Rolling: Mod assist   Supine to sit: HOB elevated, +2 for safety/equipment, Mod assist     General bed mobility comments: pt assisting more with trunk with supine to sit. Still dependent for mvmt of LE"s    Transfers Overall transfer level: Needs assistance Equipment used: Ambulation equipment used Transfers: Sit to/from Stand, Bed to chair/wheelchair/BSC Sit to Stand: +2 physical assistance, From elevated surface, Mod assist           General transfer comment: pt stood to stedy with mod A +2 for power up and fwd translation. Pt then stood 2x from flaps of stedy with min A. Could not perform more reps due to discomfort at knees. Worked on erect posture as pt tends to maintain trunk flexion but can come to upright with tactile cues Transfer via Lift Equipment: Stedy  Ambulation/Gait               General Gait Details: unable to step feet in standing   Stairs             Wheelchair Mobility    Modified Rankin (Stroke Patients Only)       Balance Overall balance assessment: Needs assistance, History of Falls Sitting-balance support: Bilateral upper extremity supported, Feet supported Sitting balance-Leahy Scale: Poor Sitting balance - Comments: min A due to posterior lean, progressed to supervision Postural control: Posterior lean Standing balance support: Bilateral upper extremity supported Standing balance-Leahy Scale: Poor Standing balance comment: mod A to maintain standing  Cognition Arousal/Alertness: Awake/alert Behavior During Therapy: WFL for tasks assessed/performed Overall Cognitive Status: Within Functional Limits for tasks assessed                                          Exercises Total Joint Exercises Ankle Circles/Pumps: AROM, Both, 10 reps, Seated Hip ABduction/ADduction: AAROM,  Left, 10 reps, Seated Long Arc Quad: AAROM, Seated, Both, 10 reps, Left    General Comments        Pertinent Vitals/Pain Pain Assessment Pain Assessment: Faces Faces Pain Scale: Hurts even more Pain Location: L hip and knee Pain Descriptors / Indicators: Tightness, Grimacing, Operative site guarding Pain Intervention(s): Limited activity within patient's tolerance, Monitored during session    Home Living                          Prior Function            PT Goals (current goals can now be found in the care plan section) Acute Rehab PT Goals Patient Stated Goal: rehab and then home PT Goal Formulation: With patient/family Time For Goal Achievement: 05/09/22 Potential to Achieve Goals: Good Progress towards PT goals: Progressing toward goals    Frequency    Min 3X/week      PT Plan Current plan remains appropriate    Co-evaluation              AM-PAC PT "6 Clicks" Mobility   Outcome Measure  Help needed turning from your back to your side while in a flat bed without using bedrails?: A Lot Help needed moving from lying on your back to sitting on the side of a flat bed without using bedrails?: Total Help needed moving to and from a bed to a chair (including a wheelchair)?: Total Help needed standing up from a chair using your arms (e.g., wheelchair or bedside chair)?: Total Help needed to walk in hospital room?: Total Help needed climbing 3-5 steps with a railing? : Total 6 Click Score: 7    End of Session Equipment Utilized During Treatment: Gait belt Activity Tolerance: Patient limited by pain;Patient limited by fatigue Patient left: with call bell/phone within reach;Other (comment);in chair;with chair alarm set (lift pad under pt in case cannot go back with stedy) Nurse Communication: Mobility status;Need for lift equipment PT Visit Diagnosis: Unsteadiness on feet (R26.81);Repeated falls (R29.6);Muscle weakness (generalized) (M62.81);Difficulty  in walking, not elsewhere classified (R26.2);Pain Pain - Right/Left: Left Pain - part of body: Hip     Time: 9201-0071 PT Time Calculation (min) (ACUTE ONLY): 29 min  Charges:  $Gait Training: 8-22 mins $Therapeutic Activity: 8-22 mins                     Leighton Roach, PT  Acute Rehab Services Secure chat preferred Office [place] 04/28/2022, 2:39 PM

## 2022-04-29 DIAGNOSIS — R531 Weakness: Secondary | ICD-10-CM | POA: Diagnosis not present

## 2022-04-29 DIAGNOSIS — R55 Syncope and collapse: Secondary | ICD-10-CM | POA: Diagnosis not present

## 2022-04-29 DIAGNOSIS — Z95818 Presence of other cardiac implants and grafts: Secondary | ICD-10-CM | POA: Diagnosis not present

## 2022-04-29 DIAGNOSIS — R2689 Other abnormalities of gait and mobility: Secondary | ICD-10-CM | POA: Diagnosis not present

## 2022-04-29 DIAGNOSIS — J449 Chronic obstructive pulmonary disease, unspecified: Secondary | ICD-10-CM | POA: Diagnosis not present

## 2022-04-29 DIAGNOSIS — I4819 Other persistent atrial fibrillation: Secondary | ICD-10-CM | POA: Diagnosis not present

## 2022-04-29 DIAGNOSIS — R41841 Cognitive communication deficit: Secondary | ICD-10-CM | POA: Diagnosis not present

## 2022-04-29 DIAGNOSIS — M6281 Muscle weakness (generalized): Secondary | ICD-10-CM | POA: Diagnosis not present

## 2022-04-29 DIAGNOSIS — I480 Paroxysmal atrial fibrillation: Secondary | ICD-10-CM | POA: Diagnosis not present

## 2022-04-29 DIAGNOSIS — Z7401 Bed confinement status: Secondary | ICD-10-CM | POA: Diagnosis not present

## 2022-04-29 DIAGNOSIS — Z96642 Presence of left artificial hip joint: Secondary | ICD-10-CM | POA: Diagnosis not present

## 2022-04-29 DIAGNOSIS — S72002A Fracture of unspecified part of neck of left femur, initial encounter for closed fracture: Secondary | ICD-10-CM | POA: Diagnosis not present

## 2022-04-29 DIAGNOSIS — M25562 Pain in left knee: Secondary | ICD-10-CM | POA: Diagnosis not present

## 2022-04-29 DIAGNOSIS — M25552 Pain in left hip: Secondary | ICD-10-CM | POA: Diagnosis not present

## 2022-04-29 DIAGNOSIS — Z471 Aftercare following joint replacement surgery: Secondary | ICD-10-CM | POA: Diagnosis not present

## 2022-04-29 DIAGNOSIS — S72002D Fracture of unspecified part of neck of left femur, subsequent encounter for closed fracture with routine healing: Secondary | ICD-10-CM | POA: Diagnosis not present

## 2022-04-29 DIAGNOSIS — G912 (Idiopathic) normal pressure hydrocephalus: Secondary | ICD-10-CM | POA: Diagnosis not present

## 2022-04-29 DIAGNOSIS — Z9181 History of falling: Secondary | ICD-10-CM | POA: Diagnosis not present

## 2022-04-29 DIAGNOSIS — M25559 Pain in unspecified hip: Secondary | ICD-10-CM | POA: Diagnosis not present

## 2022-04-29 DIAGNOSIS — Z4509 Encounter for adjustment and management of other cardiac device: Secondary | ICD-10-CM | POA: Diagnosis not present

## 2022-04-29 NOTE — TOC Transition Note (Signed)
Transition of Care [facility]) - CM/SW Discharge Note   Patient Details  Name: NEMIAH BUBAR MRN: [id number] Date of Birth: September 12, 1945  [facility]) CM/SW Contact:  Joanne Chars, LCSW Phone Number: 04/29/2022, 9:58 AM   Clinical Narrative:   Pt discharging to [facility].  RN call report to [phone].   0945: CSW confirmed with [facility] that they can receive pt this AM.  Final next level of care: Skilled Nursing Facility Barriers to Discharge: Barriers Resolved   Patient Goals and CMS Choice Patient states their goals for this hospitalization and ongoing recovery are:: return home CMS Medicare.gov Compare Post Acute Care list provided to:: Patient Represenative (must comment) (husband) Choice offered to / list presented to : Spouse  Discharge Placement              Patient chooses bed at:  [facility]) Patient to be transferred to facility by: [facility] Name of family member notified: husband Simona Huh in room Patient and family notified of of transfer: 04/29/22  Discharge Plan and Services     Post Acute Care Choice: [place]                               Social Determinants of Health (SDOH) Interventions     Readmission Risk Interventions     No data to display

## 2022-05-03 ENCOUNTER — Telehealth: Payer: Self-pay | Admitting: Cardiology

## 2022-05-05 ENCOUNTER — Ambulatory Visit (HOSPITAL_COMMUNITY): Payer: Medicare HMO

## 2022-05-05 NOTE — Telephone Encounter (Signed)
Patient said she has "so many issues she doesn't know if she is having more". I transferred call to FE ([phone]) to schedule appointment.

## 2022-05-07 ENCOUNTER — Ambulatory Visit (INDEPENDENT_AMBULATORY_CARE_PROVIDER_SITE_OTHER): Payer: Medicare HMO | Admitting: Orthopaedic Surgery

## 2022-05-07 ENCOUNTER — Ambulatory Visit: Payer: Medicare HMO | Admitting: Cardiology

## 2022-05-07 ENCOUNTER — Encounter: Payer: Self-pay | Admitting: Orthopaedic Surgery

## 2022-05-07 DIAGNOSIS — Z96642 Presence of left artificial hip joint: Secondary | ICD-10-CM

## 2022-05-07 NOTE — Progress Notes (Signed)
The patient is a 76 year old female who is just under 2 weeks out from a left total hip arthroplasty to treat an acute left hip displaced femoral neck fracture.  She is recovering at a nursing care rehab facility.  She is hoping to get home soon.  Her family is with her today.  She still not have much pain she states.  She has some weak with that leg and has a hard time getting around but overall she feels like she is making good progress as do I.  The staples are removed and Steri-Strips applied over hip incision.  Everything looks good thus far.  I did show her hip replacement model and described what the surgery involved.  From my standpoint we will see her back in 4 weeks to see how she is doing overall mobility standpoint.  No x-rays are needed.  All questions concerns were answered and addressed.

## 2022-05-08 ENCOUNTER — Encounter: Payer: Self-pay | Admitting: Cardiology

## 2022-05-08 ENCOUNTER — Ambulatory Visit: Payer: Medicare HMO | Admitting: Cardiology

## 2022-05-08 VITALS — BP 129/64 | HR 90 | Resp 16 | Ht 68.0 in | Wt 152.0 lb

## 2022-05-08 DIAGNOSIS — Z95818 Presence of other cardiac implants and grafts: Secondary | ICD-10-CM | POA: Diagnosis not present

## 2022-05-08 DIAGNOSIS — I4819 Other persistent atrial fibrillation: Secondary | ICD-10-CM

## 2022-05-08 MED ORDER — AMIODARONE HCL 200 MG PO TABS: 200.0000 mg | ORAL_TABLET | ORAL | 1 refills | Status: DC

## 2022-05-08 NOTE — Progress Notes (Signed)
Primary Physician/Referring:  [place], MD  Patient ID: [name], female    DOB: December 09, 1945, 76 y.o.   MRN: [id number]  Chief Complaint  Patient presents with   Paroxysmal atrial fibrillation   Follow-up   HPI:    [name]  is a 76 y.o. 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    Patient had an accidental fall and had was not left hip arthroplasty on 04/26/2022.  She is presently at [facility] rehab clinic.  I noticed that her atrial fibrillation burden has increased since then, brought her in as it appeared to be that she was not 100% atrial fibrillation.  She complains of being fatigued.  Otherwise no specific complaints.  She still continues to have positive attitude.  Past Medical History:  Diagnosis Date   Arthritis    Breast cancer ([place])    left breast/lumpectomy/chemo/rad 2003   COPD (chronic obstructive pulmonary disease) ([place])    DDD (degenerative disc disease), lumbar 08/31/2017   Diverticulitis    GERD (gastroesophageal reflux disease)    Hemoptysis    Hyperlipidemia    Iron deficiency    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 06/15/2021   Macular degeneration    bilateral   Personal history of radiation therapy 2003   Pulmonary embolism ([place])    Syncope and collapse 05/17/2021   Tachycardia    Past Surgical History:  Procedure Laterality Date   BACK SURGERY     BREAST LUMPECTOMY     COLONOSCOPY  01/28/2012   Procedure: COLONOSCOPY;  Surgeon: Rogene Houston, MD;  [facility];  Service: Endoscopy;  Laterality: N/A;  1200   COLONOSCOPY  01-2012   ESOPHAGOGASTRODUODENOSCOPY N/A 07/01/2013   Procedure: ESOPHAGOGASTRODUODENOSCOPY (EGD);  Surgeon: Rogene Houston, MD;  Location: [facility];  Service: Endoscopy;  Laterality: N/A;  1:10   LAPAROSCOPIC TUBAL LIGATION  1970   TONSILLECTOMY     Patient was age 78   TOTAL HIP ARTHROPLASTY Left 04/25/2022   Procedure: TOTAL HIP ARTHROPLASTY ANTERIOR APPROACH;  Surgeon: Mcarthur Rossetti, MD;  Location: [facility];  Service: Orthopedics;  Laterality: Left;   Family History  Problem Relation Age of Onset   Heart disease Mother    Alzheimer's disease Mother    Heart disease Father    Lung disease Father    Healthy Son     Social History   Tobacco Use   Smoking status: Some Days    Packs/day: 0.50    Years: 30.00    Total pack years: 15.00    Types: Cigarettes   Smokeless tobacco: Never   Tobacco comments:    05/17/18 1- 1.5 PPD  Substance Use Topics   Alcohol use: No    Alcohol/week: 0.0 standard drinks of alcohol   Marital Status: Married  ROS  Review of Systems  Cardiovascular:  Positive for dyspnea on exertion (chronic). Negative for chest pain and leg swelling.  Neurological:  Positive for dizziness (no recurrence) and loss of balance (hydrocephalus).   Objective  Blood pressure 129/64, pulse 90, resp. rate 16,  height '5\' 8"'$  (1.727 m), weight 152 lb (68.9 kg), SpO2 93 %. Body mass index is 23.11 kg/m.     05/08/2022    3:37 PM 04/29/2022    4:30 AM 04/28/2022    8:42 PM  Vitals with BMI  Height '5\' 8"'$     Weight 152 lbs    BMI 44.01    Systolic 027 253 664  Diastolic 64 52 60  Pulse 90 81 59    Orthostatic VS for the past 72 hrs (Last 3 readings):  Patient Position BP Location Cuff Size  05/08/22 1537 Sitting Left Arm Normal    Physical Exam Vitals reviewed.  Neck:     Vascular: Carotid bruit (bilateral) present. No JVD.  Cardiovascular:     Rate and Rhythm: Tachycardia  present. Rhythm irregular.     Pulses: Intact distal pulses.     Heart sounds: Normal heart sounds. No murmur heard.    No gallop.  Pulmonary:     Effort: Pulmonary effort is normal.     Breath sounds: Rales (scattered) present.  Abdominal:     General: Bowel sounds are normal.     Palpations: Abdomen is soft.  Musculoskeletal:     Right lower leg: Edema (1-2+ ankle edema) present.     Left lower leg: Edema (1-2+ ankle edema) present.     Laboratory examination:   Lab Results  Component Value Date   NA 137 04/26/2022   K 3.9 04/26/2022   CO2 29 04/26/2022   GLUCOSE 125 (H) 04/26/2022   BUN 14 04/26/2022   CREATININE 0.54 04/26/2022   CALCIUM 8.6 (L) 04/26/2022   GFRNONAA >60 04/26/2022    Lab Results  Component Value Date   WBC 9.7 04/27/2022   HGB 9.1 (L) 04/27/2022   HCT 27.1 (L) 04/27/2022   MCV 94.8 04/27/2022   PLT 243 04/27/2022   External labs:   Hemoglobin 13.600 g/d 01/01/2022 Platelets 369.000 10 01/01/2022  TSH 1.823 01/01/2022 Allergies   Allergies  Allergen Reactions   Sulfa Antibiotics Other (See Comments)    Childhood Allergy  05/17/18 patient denies    Medication list after today's encounter    Current Outpatient Medications:    amiodarone (PACERONE) 200 MG tablet, Take 1 tablet (200 mg total) by mouth as directed. 1 tab 3 times daily for 1 week 1 tablet twice daily second week and 1 tablet daily continuously following this, Disp: 60 tablet, Rfl: 1   apixaban (ELIQUIS) 5 MG TABS tablet, Take 1 tablet (5 mg total) by mouth 2 (two) times daily., Disp: 180 tablet, Rfl: 1   atorvastatin (LIPITOR) 20 MG tablet, Take 1 tablet (20 mg total) by mouth daily., Disp: 90 tablet, Rfl: 3   CALCIUM PO, Take 1 tablet by mouth daily., Disp: , Rfl:    Cholecalciferol (VITAMIN D PO), Take 1 tablet by mouth daily., Disp: , Rfl:    Coenzyme Q10 (CO Q 10 PO), Take 1 tablet by mouth daily., Disp: , Rfl:    Multiple Vitamin (MULTIVITAMIN WITH MINERALS) TABS tablet, Take  1 tablet by mouth daily., Disp: , Rfl:    Multiple Vitamins-Minerals (PRESERVISION AREDS 2) CAPS, Take by mouth 2 (two) times daily. , Disp: , Rfl:    Omega-3 Fatty Acids (FISH OIL) 1000 MG CAPS, Take by mouth daily., Disp: , Rfl:    HYDROcodone-acetaminophen (NORCO/VICODIN) 5-325 MG tablet, Take 1-2 tablets by mouth every 6 (six) hours as needed for moderate pain. (Patient not taking: Reported on 05/08/2022), Disp: 30  tablet, Rfl: 0   Radiology:   MRI 11/26/2021: 1. No acute intracranial abnormality. 2. Stable supratentorial ventriculomegaly. 3. Mild-to-moderate chronic white matter disease, most likely related to chronic microangiopathy, unchanged. 4. Chronic small cerebellar infarcts.  Cardiac Studies:   PCV ECHOCARDIOGRAM COMPLETE 05/06/2021 Left ventricle cavity is normal in size and wall thickness. Normal global wall motion. Normal LV systolic function with visual EF 64%. Doppler evidence of grade I (impaired) diastolic dysfunction, normal LAP. Left atrial cavity is moderately dilated. Mild mitral valve leaflet thickening. No evidence of mitral stenosis. Mild (Grade I) mitral regurgitation. Normal right atrial pressure. Compared to previous study in 2019, moderate LA dilatation is new finding. No other significant change noted.    PCV MYOCARDIAL PERFUSION WITH LEXISCAN 05/06/2021 Nondiagnostic ECG stress. The heart rate response was consistent with Lexiscan.  The blood pressure response was physiologic. Breast attenuation artifact in the mid to distal anterior and septal region without ischemia.  Normal myocardial perfusion. Overall LV systolic function is normal without regional wall motion abnormalities. Stress LV EF: 64%. No previous exam available for comparison. Low risk.  Carotid artery duplex 12/23/2021: Duplex suggests stenosis in the right internal carotid artery (1-15%). Duplex suggests stenosis in the left internal carotid artery (50-69%), lower end of the  spectrum. Antegrade right vertebral artery flow. Antegrade left vertebral artery flow. Mild homogeneous plaque in bilateral carotid arteries. Compared to the study done on 06/12/2021, no significant change. Follow up in six months is appropriate if clinically indicated.  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   Remote loop recorder transmission 04/19/2022: Predominant rhythm is normal sinus rhythm.  Previoius AT/AF burden 10.3%.  Episode frequency of AF has reduced since Beginning of Sept 2023 <1%.  Unscheduled (Alert) atrial fibrillation since 05/01/2022   Loop recorder interrogation in the office 05/08/2022: 0 Atrial fibrillation burden increased to almost 100% starting 04/27/2022, has had episodes of sinus rhythm.  Overall atrial fibrillation burden over the past 3 weeks is 13%. EKG:   EKG 05/08/2022: Atrial fibrillation with rapid ventricular response at rate of 125 bpm.  Incomplete right bundle branch block.  No evidence of ischemia, normal QT interval. Compared to 04/04/2022, atrial fibrillation is new.  Assessment     ICD-10-CM   1. Persistent atrial fibrillation (HCC)  I48.19 EKG 12-Lead    amiodarone (PACERONE) 200 MG tablet    2. Status post placement of implantable loop recorder  Z95.818       CHA2DS2-VASc Score is 5.  Yearly risk of stroke: 7.2% (A, F, CVA).  Score of 1=0.6; 2=2.2; 3=3.2; 4=4.8; 5=7.2; 6=9.8; 7=>9.8) -(CHF; HTN; vasc disease DM,  Female = 1; Age <65 =0; 65-74 = 1,  >75 =2; stroke/embolism= 2).   There are no discontinued medications.   Meds ordered this encounter  Medications   amiodarone (PACERONE) 200 MG tablet    Sig: Take 1 tablet (200 mg total) by mouth as directed. 1 tab 3 times daily for 1 week 1 tablet twice daily second week and 1 tablet daily continuously following this    Dispense:  60 tablet    Refill:  1   Orders Placed This Encounter  Procedures   EKG 12-Lead   Recommendations:   [name] is a 76 y.o. Caucasian female patient  with remote pulmonary embolism, history of breast cancer SP lumpectomy followed by chemo and radiation therapy in 2003, history of TIA and cerebellar embolic stroke in 8832 and new stroke by MRI in 05/2021, normal pressure hydrocephalus on conservative therapy,  chronic back pain and spina bifida and gait instability, COPD quit smoking on 05/10/2021, chronic bilateral lower extremity venous insufficiency, loop recorder implantation on 06/12/2021 for evaluation of syncope and also stroke.    I noticed that her atrial fibrillation burden has increased since then, brought her in as it appeared to be that she was not 100% atrial fibrillation.   1. Persistent atrial fibrillation [facility]) Patient had an accidental fall and had was not left hip arthroplasty on 04/26/2022.  She is presently at [facility] rehab clinic.  I noticed that her atrial fibrillation burden has increased since then but appears to be trending down.  Today she is in atrial fibrillation with RVR.   Her fatigue could be related to postop state but could also be related to persistent atrial fibrillation.  I will start her on amiodarone 200 mg 3 times daily for 1 week followed by twice daily for 1 week and then 200 mg daily and I would like to see her back in the office in 6 weeks for follow-up.  She will continue with apixaban for atrial fibrillation.  2. Status post placement of implantable loop recorder Loop recorder tracing reviewed, recent performed to get better atrial fibrillation burden.   Adrian Prows, MD, [facility] 05/08/2022, 4:16 PM Office: [phone] Fax: [phone] Pager: [phone]

## 2022-05-20 DIAGNOSIS — Z95818 Presence of other cardiac implants and grafts: Secondary | ICD-10-CM | POA: Diagnosis not present

## 2022-05-20 DIAGNOSIS — Z4509 Encounter for adjustment and management of other cardiac device: Secondary | ICD-10-CM | POA: Diagnosis not present

## 2022-05-20 DIAGNOSIS — R55 Syncope and collapse: Secondary | ICD-10-CM | POA: Diagnosis not present

## 2022-05-22 DIAGNOSIS — Z471 Aftercare following joint replacement surgery: Secondary | ICD-10-CM | POA: Diagnosis not present

## 2022-05-22 DIAGNOSIS — Z96642 Presence of left artificial hip joint: Secondary | ICD-10-CM | POA: Diagnosis not present

## 2022-05-22 DIAGNOSIS — M25552 Pain in left hip: Secondary | ICD-10-CM | POA: Diagnosis not present

## 2022-05-23 DIAGNOSIS — R2689 Other abnormalities of gait and mobility: Secondary | ICD-10-CM | POA: Diagnosis not present

## 2022-05-23 DIAGNOSIS — R531 Weakness: Secondary | ICD-10-CM | POA: Diagnosis not present

## 2022-05-23 DIAGNOSIS — M6281 Muscle weakness (generalized): Secondary | ICD-10-CM | POA: Diagnosis not present

## 2022-05-23 DIAGNOSIS — R41841 Cognitive communication deficit: Secondary | ICD-10-CM | POA: Diagnosis not present

## 2022-05-23 DIAGNOSIS — S72002D Fracture of unspecified part of neck of left femur, subsequent encounter for closed fracture with routine healing: Secondary | ICD-10-CM | POA: Diagnosis not present

## 2022-05-24 DIAGNOSIS — R2689 Other abnormalities of gait and mobility: Secondary | ICD-10-CM | POA: Diagnosis not present

## 2022-05-24 DIAGNOSIS — M6281 Muscle weakness (generalized): Secondary | ICD-10-CM | POA: Diagnosis not present

## 2022-05-24 DIAGNOSIS — S72002D Fracture of unspecified part of neck of left femur, subsequent encounter for closed fracture with routine healing: Secondary | ICD-10-CM | POA: Diagnosis not present

## 2022-05-24 DIAGNOSIS — R531 Weakness: Secondary | ICD-10-CM | POA: Diagnosis not present

## 2022-05-24 DIAGNOSIS — R41841 Cognitive communication deficit: Secondary | ICD-10-CM | POA: Diagnosis not present

## 2022-05-25 DIAGNOSIS — S72002D Fracture of unspecified part of neck of left femur, subsequent encounter for closed fracture with routine healing: Secondary | ICD-10-CM | POA: Diagnosis not present

## 2022-05-25 DIAGNOSIS — R2689 Other abnormalities of gait and mobility: Secondary | ICD-10-CM | POA: Diagnosis not present

## 2022-05-25 DIAGNOSIS — R41841 Cognitive communication deficit: Secondary | ICD-10-CM | POA: Diagnosis not present

## 2022-05-25 DIAGNOSIS — R531 Weakness: Secondary | ICD-10-CM | POA: Diagnosis not present

## 2022-05-25 DIAGNOSIS — M6281 Muscle weakness (generalized): Secondary | ICD-10-CM | POA: Diagnosis not present

## 2022-05-26 DIAGNOSIS — R531 Weakness: Secondary | ICD-10-CM | POA: Diagnosis not present

## 2022-05-26 DIAGNOSIS — R2689 Other abnormalities of gait and mobility: Secondary | ICD-10-CM | POA: Diagnosis not present

## 2022-05-26 DIAGNOSIS — R41841 Cognitive communication deficit: Secondary | ICD-10-CM | POA: Diagnosis not present

## 2022-05-26 DIAGNOSIS — S72002D Fracture of unspecified part of neck of left femur, subsequent encounter for closed fracture with routine healing: Secondary | ICD-10-CM | POA: Diagnosis not present

## 2022-05-26 DIAGNOSIS — M6281 Muscle weakness (generalized): Secondary | ICD-10-CM | POA: Diagnosis not present

## 2022-05-27 DIAGNOSIS — M6281 Muscle weakness (generalized): Secondary | ICD-10-CM | POA: Diagnosis not present

## 2022-05-27 DIAGNOSIS — R2689 Other abnormalities of gait and mobility: Secondary | ICD-10-CM | POA: Diagnosis not present

## 2022-05-27 DIAGNOSIS — R531 Weakness: Secondary | ICD-10-CM | POA: Diagnosis not present

## 2022-05-27 DIAGNOSIS — R41841 Cognitive communication deficit: Secondary | ICD-10-CM | POA: Diagnosis not present

## 2022-05-27 DIAGNOSIS — S72002D Fracture of unspecified part of neck of left femur, subsequent encounter for closed fracture with routine healing: Secondary | ICD-10-CM | POA: Diagnosis not present

## 2022-05-28 DIAGNOSIS — R2689 Other abnormalities of gait and mobility: Secondary | ICD-10-CM | POA: Diagnosis not present

## 2022-05-28 DIAGNOSIS — R531 Weakness: Secondary | ICD-10-CM | POA: Diagnosis not present

## 2022-05-28 DIAGNOSIS — S72002D Fracture of unspecified part of neck of left femur, subsequent encounter for closed fracture with routine healing: Secondary | ICD-10-CM | POA: Diagnosis not present

## 2022-05-28 DIAGNOSIS — R41841 Cognitive communication deficit: Secondary | ICD-10-CM | POA: Diagnosis not present

## 2022-05-28 DIAGNOSIS — M6281 Muscle weakness (generalized): Secondary | ICD-10-CM | POA: Diagnosis not present

## 2022-05-29 DIAGNOSIS — S72002D Fracture of unspecified part of neck of left femur, subsequent encounter for closed fracture with routine healing: Secondary | ICD-10-CM | POA: Diagnosis not present

## 2022-05-29 DIAGNOSIS — R41841 Cognitive communication deficit: Secondary | ICD-10-CM | POA: Diagnosis not present

## 2022-05-29 DIAGNOSIS — M6281 Muscle weakness (generalized): Secondary | ICD-10-CM | POA: Diagnosis not present

## 2022-05-29 DIAGNOSIS — R2689 Other abnormalities of gait and mobility: Secondary | ICD-10-CM | POA: Diagnosis not present

## 2022-05-29 DIAGNOSIS — R531 Weakness: Secondary | ICD-10-CM | POA: Diagnosis not present

## 2022-05-30 DIAGNOSIS — R41841 Cognitive communication deficit: Secondary | ICD-10-CM | POA: Diagnosis not present

## 2022-05-30 DIAGNOSIS — R531 Weakness: Secondary | ICD-10-CM | POA: Diagnosis not present

## 2022-05-30 DIAGNOSIS — R2689 Other abnormalities of gait and mobility: Secondary | ICD-10-CM | POA: Diagnosis not present

## 2022-05-30 DIAGNOSIS — M6281 Muscle weakness (generalized): Secondary | ICD-10-CM | POA: Diagnosis not present

## 2022-05-30 DIAGNOSIS — S72002D Fracture of unspecified part of neck of left femur, subsequent encounter for closed fracture with routine healing: Secondary | ICD-10-CM | POA: Diagnosis not present

## 2022-06-06 ENCOUNTER — Other Ambulatory Visit: Payer: Self-pay | Admitting: *Deleted

## 2022-06-06 NOTE — Patient Outreach (Signed)
Received EMMI notification [name] did not receive discharge instructions upon [facility] skilled nursing discharge.   Telephone call made to [name] [phone] to follow up. Patient identifiers confirmed. [name] states her husband had all the discharge paperwork. Denies having any concerns or questions. Confirms she has follow up appointments scheduled.   No further care coordination needs assessed.   Marthenia Rolling, MSN, RN,BSN [place] Acute Care Coordinator [phone] (Direct dial)

## 2022-06-09 ENCOUNTER — Ambulatory Visit (INDEPENDENT_AMBULATORY_CARE_PROVIDER_SITE_OTHER): Payer: Medicare HMO | Admitting: Orthopaedic Surgery

## 2022-06-09 ENCOUNTER — Encounter: Payer: Self-pay | Admitting: Orthopaedic Surgery

## 2022-06-09 DIAGNOSIS — Z96642 Presence of left artificial hip joint: Secondary | ICD-10-CM

## 2022-06-09 DIAGNOSIS — M25562 Pain in left knee: Secondary | ICD-10-CM

## 2022-06-09 DIAGNOSIS — M25462 Effusion, left knee: Secondary | ICD-10-CM | POA: Diagnosis not present

## 2022-06-09 MED ORDER — METHYLPREDNISOLONE ACETATE 40 MG/ML IJ SUSP
40.0000 mg | INTRAMUSCULAR | Status: AC | PRN
  Administered 2022-06-09: 40 mg via INTRA_ARTICULAR

## 2022-06-09 MED ORDER — LIDOCAINE HCL 1 % IJ SOLN
3.0000 mL | INTRAMUSCULAR | Status: AC | PRN
  Administered 2022-06-09: 3 mL

## 2022-06-09 NOTE — Progress Notes (Signed)
The patient is a 77 year old female who is here for follow-up after having a left total hip arthroplasty to treat an acute left hip displaced femoral neck fracture.  This was at the end of November.  She is of the hip is doing well but her knee is really bothersome and swollen and painful for her.  This has been more an acute issue with her left knee.  Her left operative hip moves smoothly and fluidly.  Her left knee is slightly warm and does have an effusion.  Her pain with the knee is consistent certainly with osteoarthritis.  In the office today I talked to her about having her left knee aspirated and a steroid injection placed in her left knee to treat the acute pain that she is having.  I was able to get about 10 cc of fluid off of the knee.  I believe I could have gotten more based on her exam.  I then placed a steroid injection in her knee and she got some relief from the lidocaine.  She is going continue to work on moving her knee back-and-forth.  I would like to see her back in 3 weeks.  At that visit we will have a low AP pelvis but also an AP and lateral of her left knee.       Procedure Note  Patient: [name]             Date of Birth: November 10, 1945           MRN: [id number]             Visit Date: 06/09/2022  Procedures: Visit Diagnoses:  1. Status post total replacement of left hip   2. Effusion, left knee   3. Acute pain of left knee     Large Joint Inj: L knee on 06/09/2022 3:01 PM Indications: diagnostic evaluation and pain Details: 22 G 1.5 in needle, superolateral approach  Arthrogram: No  Medications: 3 mL lidocaine 1 %; 40 mg methylPREDNISolone acetate 40 MG/ML Outcome: tolerated well, no immediate complications Procedure, treatment alternatives, risks and benefits explained, specific risks discussed. Consent was given by the patient. Immediately prior to procedure a time out was called to verify the correct patient, procedure, equipment, support staff and  site/side marked as required. Patient was prepped and draped in the usual sterile fashion.

## 2022-06-10 DIAGNOSIS — I4891 Unspecified atrial fibrillation: Secondary | ICD-10-CM | POA: Diagnosis not present

## 2022-06-10 DIAGNOSIS — I10 Essential (primary) hypertension: Secondary | ICD-10-CM | POA: Diagnosis not present

## 2022-06-10 DIAGNOSIS — Z96642 Presence of left artificial hip joint: Secondary | ICD-10-CM | POA: Diagnosis not present

## 2022-06-10 DIAGNOSIS — R079 Chest pain, unspecified: Secondary | ICD-10-CM | POA: Diagnosis not present

## 2022-06-12 DIAGNOSIS — Z96642 Presence of left artificial hip joint: Secondary | ICD-10-CM | POA: Diagnosis not present

## 2022-06-12 DIAGNOSIS — M5136 Other intervertebral disc degeneration, lumbar region: Secondary | ICD-10-CM | POA: Diagnosis not present

## 2022-06-12 DIAGNOSIS — S72002D Fracture of unspecified part of neck of left femur, subsequent encounter for closed fracture with routine healing: Secondary | ICD-10-CM | POA: Diagnosis not present

## 2022-06-12 DIAGNOSIS — E785 Hyperlipidemia, unspecified: Secondary | ICD-10-CM | POA: Diagnosis not present

## 2022-06-12 DIAGNOSIS — K219 Gastro-esophageal reflux disease without esophagitis: Secondary | ICD-10-CM | POA: Diagnosis not present

## 2022-06-12 DIAGNOSIS — Z853 Personal history of malignant neoplasm of breast: Secondary | ICD-10-CM | POA: Diagnosis not present

## 2022-06-12 DIAGNOSIS — J449 Chronic obstructive pulmonary disease, unspecified: Secondary | ICD-10-CM | POA: Diagnosis not present

## 2022-06-12 DIAGNOSIS — I4891 Unspecified atrial fibrillation: Secondary | ICD-10-CM | POA: Diagnosis not present

## 2022-06-12 DIAGNOSIS — Z8673 Personal history of transient ischemic attack (TIA), and cerebral infarction without residual deficits: Secondary | ICD-10-CM | POA: Diagnosis not present

## 2022-06-18 ENCOUNTER — Ambulatory Visit: Payer: Medicare HMO | Admitting: Cardiology

## 2022-06-18 ENCOUNTER — Encounter: Payer: Self-pay | Admitting: Cardiology

## 2022-06-18 VITALS — BP 140/68 | HR 78 | Ht 68.0 in | Wt 141.6 lb

## 2022-06-18 DIAGNOSIS — Z95818 Presence of other cardiac implants and grafts: Secondary | ICD-10-CM

## 2022-06-18 DIAGNOSIS — I951 Orthostatic hypotension: Secondary | ICD-10-CM | POA: Diagnosis not present

## 2022-06-18 DIAGNOSIS — Q059 Spina bifida, unspecified: Secondary | ICD-10-CM | POA: Diagnosis not present

## 2022-06-18 DIAGNOSIS — I480 Paroxysmal atrial fibrillation: Secondary | ICD-10-CM

## 2022-06-18 DIAGNOSIS — J449 Chronic obstructive pulmonary disease, unspecified: Secondary | ICD-10-CM | POA: Diagnosis not present

## 2022-06-18 DIAGNOSIS — I4819 Other persistent atrial fibrillation: Secondary | ICD-10-CM

## 2022-06-18 MED ORDER — AMIODARONE HCL 200 MG PO TABS: 100.0000 mg | ORAL_TABLET | Freq: Every day | ORAL | 1 refills | Status: DC

## 2022-06-18 NOTE — Progress Notes (Signed)
Primary Physician/Referring:  Odin Nation, MD  Patient ID: [name], female    DOB: 06-Jun-1945, 77 y.o.   MRN: [id number]  Chief Complaint  Patient presents with   Persistent atrial fibrillation   Follow-up    Repeat EKG   HPI:    [name]  is a 77 y.o. Caucasian female patient with remote pulmonary embolism, history of breast cancer SP lumpectomy followed by chemo and radiation therapy in 2003, history of TIA and cerebellar embolic stroke in 0998 and new stroke by MRI in 05/2021, normal pressure hydrocephalus on conservative therapy, chronic back pain and spina bifida and gait instability, COPD quit smoking on 05/10/2021, chronic bilateral lower extremity venous insufficiency, loop recorder implantation on 06/12/2021 for evaluation of syncope and also stroke.    Patient had an accidental fall and had was not left hip arthroplasty on 04/26/2022.  She is undergoing home physical therapy now, has recuperated well.  States that she is feeling the best she has in a while.  No chest pain, no palpitations, she has not had any syncope.  Past Medical History:  Diagnosis Date   Arthritis    Breast cancer ([place])    left breast/lumpectomy/chemo/rad 2003   COPD (chronic obstructive pulmonary disease) ([place])    DDD (degenerative disc disease), lumbar 08/31/2017   Diverticulitis    GERD (gastroesophageal reflux disease)    Hemoptysis    Hyperlipidemia    Iron deficiency    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 06/15/2021   Macular degeneration    bilateral   Personal history of radiation therapy 2003   Pulmonary embolism ([place])    Syncope and collapse 05/17/2021   Tachycardia    Past Surgical History:  Procedure Laterality Date   BACK SURGERY     BREAST LUMPECTOMY     COLONOSCOPY  01/28/2012   Procedure: COLONOSCOPY;  Surgeon: Rogene Houston, MD;  [facility];  Service: Endoscopy;  Laterality: N/A;  1200   COLONOSCOPY  01-2012   ESOPHAGOGASTRODUODENOSCOPY N/A  07/01/2013   Procedure: ESOPHAGOGASTRODUODENOSCOPY (EGD);  Surgeon: Rogene Houston, MD;  Location: [facility];  Service: Endoscopy;  Laterality: N/A;  1:10   LAPAROSCOPIC TUBAL LIGATION  1970   TONSILLECTOMY     Patient was age 13   TOTAL HIP ARTHROPLASTY Left 04/25/2022   Procedure: TOTAL HIP ARTHROPLASTY ANTERIOR APPROACH;  Surgeon: Mcarthur Rossetti, MD;  Location: [place];  Service: Orthopedics;  Laterality: Left;   Family History  Problem Relation Age of Onset   Heart disease Mother    Alzheimer's disease Mother    Heart disease Father    Lung disease Father    Healthy Son     Social History   Tobacco Use   Smoking status: Some Days    Packs/day: 0.50    Years: 30.00    Total pack years: 15.00    Types: Cigarettes   Smokeless tobacco: Never   Tobacco comments:    05/17/18 1- 1.5 PPD  Substance Use Topics   Alcohol use: No    Alcohol/week: 0.0 standard drinks of alcohol   Marital Status: Married  ROS  Review of Systems  Cardiovascular:  Positive for dyspnea on exertion (chronic). Negative for chest pain and leg swelling.  Neurological:  Positive for dizziness (no recurrence) and loss of balance (hydrocephalus).   Objective  Blood pressure (!) 140/68, pulse 78, height '5\' 8"'$  (1.727 m), weight 141 lb 9.6 oz (64.2 kg), SpO2 95 %. Body  mass index is 21.53 kg/m.     06/18/2022    1:32 PM 05/08/2022    3:37 PM 04/29/2022    4:30 AM  Vitals with BMI  Height '5\' 8"'$  '5\' 8"'$    Weight 141 lbs 10 oz 152 lbs   BMI 35.00 93.81   Systolic 829 937 169  Diastolic 68 64 52  Pulse 78 90 81    Physical Exam Vitals reviewed.  Neck:     Vascular: Carotid bruit (bilateral) present. No JVD.  Cardiovascular:     Rate and Rhythm: Tachycardia present. Rhythm irregular.     Pulses: Intact distal pulses.     Heart sounds: Normal heart sounds. No murmur heard.    No gallop.  Pulmonary:     Effort: Pulmonary effort is normal.     Breath sounds: Rales (scattered) present.   Abdominal:     General: Bowel sounds are normal.     Palpations: Abdomen is soft.  Musculoskeletal:     Right lower leg: Edema (1-2+ ankle edema) present.     Left lower leg: Edema (1-2+ ankle edema) present.     Laboratory examination:   Lab Results  Component Value Date   NA 137 04/26/2022   K 3.9 04/26/2022   CO2 29 04/26/2022   GLUCOSE 125 (H) 04/26/2022   BUN 14 04/26/2022   CREATININE 0.54 04/26/2022   CALCIUM 8.6 (L) 04/26/2022   GFRNONAA >60 04/26/2022    Lab Results  Component Value Date   WBC 9.7 04/27/2022   HGB 9.1 (L) 04/27/2022   HCT 27.1 (L) 04/27/2022   MCV 94.8 04/27/2022   PLT 243 04/27/2022   External labs:   Hemoglobin 13.600 g/d 01/01/2022 Platelets 369.000 10 01/01/2022  TSH 1.823 01/01/2022 Allergies   Allergies  Allergen Reactions   Sulfa Antibiotics Other (See Comments)    Childhood Allergy  05/17/18 patient denies    Medication list after today's encounter    Current Outpatient Medications:    apixaban (ELIQUIS) 5 MG TABS tablet, Take 1 tablet (5 mg total) by mouth 2 (two) times daily., Disp: 180 tablet, Rfl: 1   atorvastatin (LIPITOR) 20 MG tablet, Take 1 tablet (20 mg total) by mouth daily., Disp: 90 tablet, Rfl: 3   CALCIUM PO, Take 1 tablet by mouth daily., Disp: , Rfl:    Cholecalciferol (VITAMIN D PO), Take 1 tablet by mouth daily., Disp: , Rfl:    Coenzyme Q10 (CO Q 10 PO), Take 1 tablet by mouth daily., Disp: , Rfl:    Multiple Vitamin (MULTIVITAMIN WITH MINERALS) TABS tablet, Take 1 tablet by mouth daily., Disp: , Rfl:    Multiple Vitamins-Minerals (PRESERVISION AREDS 2) CAPS, Take by mouth 2 (two) times daily. , Disp: , Rfl:    Omega-3 Fatty Acids (FISH OIL) 1000 MG CAPS, Take by mouth daily., Disp: , Rfl:    amiodarone (PACERONE) 200 MG tablet, Take 0.5 tablets (100 mg total) by mouth daily., Disp: 45 tablet, Rfl: 1   Radiology:   MRI 11/26/2021: 1. No acute intracranial abnormality. 2. Stable supratentorial  ventriculomegaly. 3. Mild-to-moderate chronic white matter disease, most likely related to chronic microangiopathy, unchanged. 4. Chronic small cerebellar infarcts.  Cardiac Studies:   PCV ECHOCARDIOGRAM COMPLETE 05/06/2021 Left ventricle cavity is normal in size and wall thickness. Normal global wall motion. Normal LV systolic function with visual EF 64%. Doppler evidence of grade I (impaired) diastolic dysfunction, normal LAP. Left atrial cavity is moderately dilated. Mild mitral valve leaflet thickening. No evidence  of mitral stenosis. Mild (Grade I) mitral regurgitation. Normal right atrial pressure. Compared to previous study in 2019, moderate LA dilatation is new finding. No other significant change noted.    PCV MYOCARDIAL PERFUSION WITH LEXISCAN 05/06/2021 Nondiagnostic ECG stress. The heart rate response was consistent with Lexiscan.  The blood pressure response was physiologic. Breast attenuation artifact in the mid to distal anterior and septal region without ischemia.  Normal myocardial perfusion. Overall LV systolic function is normal without regional wall motion abnormalities. Stress LV EF: 64%. No previous exam available for comparison. Low risk.  Carotid artery duplex 12/23/2021: Duplex suggests stenosis in the right internal carotid artery (1-15%). 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 Antegrade right vertebral artery flow. Antegrade left vertebral artery flow. Mild homogeneous plaque in bilateral carotid arteries. Compared to the study done on 06/12/2021, no significant change. Follow up in six months is appropriate if clinically indicated.  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   Unscheduled (Alert) atrial fibrillation since 05/01/2022   Loop recorder interrogation in the office 05/08/2022: 0 Atrial fibrillation burden increased to almost 100% starting 04/27/2022, has had episodes of sinus rhythm.  Overall  atrial fibrillation burden over the past 3 weeks is 13%.  Remote loop recorder transmission 05/20/2022: Predominant rhythm is normal sinus rhythm.  Brief AF episodes, <10 minutes.  AT/AF burden <1%.  Previoius AT/AF burden 10.3%.  Episode frequency of AF has reduced since Beginning of Sept 2023 <1%. Since 05/08/22 AF 2% burden.  EKG:   EKG 06/18/2022: Sinus rhythm with first-degree AV block at rate of 74 bpm, normal axis, incomplete right bundle branch block.  No evidence of ischemia.  Compared to 05/08/2022, atrial fibrillation with RVR not present.  Assessment     ICD-10-CM   1. Paroxysmal atrial fibrillation (HCC)  I48.0 EKG 12-Lead    amiodarone (PACERONE) 200 MG tablet    2. Orthostatic hypotension  I95.1     3. 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  Z95.818     4. Persistent atrial fibrillation (HCC)  I48.19       CHA2DS2-VASc Score is 5.  Yearly risk of stroke: 7.2% (A, F, CVA).  Score of 1=0.6; 2=2.2; 3=3.2; 4=4.8; 5=7.2; 6=9.8; 7=>9.8) -(CHF; HTN; vasc disease DM,  Female = 1; Age <65 =0; 65-74 = 1,  >75 =2; stroke/embolism= 2).   Medications Discontinued During This Encounter  Medication Reason   HYDROcodone-acetaminophen (NORCO/VICODIN) 5-325 MG tablet Patient Preference   amiodarone (PACERONE) 200 MG tablet Reorder     Meds ordered this encounter  Medications   amiodarone (PACERONE) 200 MG tablet    Sig: Take 0.5 tablets (100 mg total) by mouth daily.    Dispense:  45 tablet    Refill:  1   Orders Placed This Encounter  Procedures   EKG 12-Lead   Recommendations:   SYREETA FIGLER is a 77 y.o. Caucasian female patient with remote pulmonary embolism, history of breast cancer SP lumpectomy followed by chemo and radiation therapy in 2003, history of TIA and cerebellar embolic stroke in 0814 and new stroke by MRI in 05/2021, normal pressure hydrocephalus on conservative therapy, chronic back pain and spina bifida and gait instability, COPD quit smoking on 05/10/2021,  chronic bilateral lower extremity venous insufficiency, loop recorder implantation on 06/12/2021 for evaluation of syncope and also stroke.    Patient had a follow-up on 05/24/2022 and had left hip fracture for which she underwent arthroplasty.  Fortunately she has recuperated  well.  1. Paroxysmal atrial fibrillation (HCC) She is maintaining sinus rhythm over the past 1 month.  Continue present amiodarone however will reduce the dose to 200 mg 1/2 tablet daily.  2. Orthostatic hypotension Patient has orthostatic hypotension, today the systolic blood pressure was 140 mmHg, she is not on any antihypertensive medications.  She has not had any significant orthostatic symptoms, she has occasional dizziness when she suddenly stands up.  She is aware to be careful in view of fall risk.  3. 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 I reviewed the results of the loop recorder transmission, unfortunately over the past 1 month she has had no recurrence of atrial fibrillation.   I noticed that her atrial fibrillation burden has increased since then, brought her in as it appeared to be that she was not 100% atrial fibrillation.   1. Persistent atrial fibrillation [facility]) Patient had an accidental fall and had was not left hip arthroplasty on 04/26/2022.  She is presently at [facility] rehab clinic.  I noticed that her atrial fibrillation burden has increased since then but appears to be trending down.  Today she is in atrial fibrillation with RVR.   Her fatigue could be related to postop state but could also be related to persistent atrial fibrillation.  I will start her on amiodarone 200 mg 3 times daily for 1 week followed by twice daily for 1 week and then 200 mg daily and I would like to see her back in the office in 6 weeks for follow-up.  She will continue with apixaban for atrial fibrillation.  2. Status post placement of implantable loop recorder Loop recorder tracing reviewed, recent performed to  get better atrial fibrillation burden.   Adrian Prows, MD, [facility] 06/18/2022, 2:20 PM Office: [phone] Fax: [phone] Pager: [phone]

## 2022-06-19 DIAGNOSIS — Z96642 Presence of left artificial hip joint: Secondary | ICD-10-CM | POA: Diagnosis not present

## 2022-06-19 DIAGNOSIS — E785 Hyperlipidemia, unspecified: Secondary | ICD-10-CM | POA: Diagnosis not present

## 2022-06-19 DIAGNOSIS — K219 Gastro-esophageal reflux disease without esophagitis: Secondary | ICD-10-CM | POA: Diagnosis not present

## 2022-06-19 DIAGNOSIS — S72002D Fracture of unspecified part of neck of left femur, subsequent encounter for closed fracture with routine healing: Secondary | ICD-10-CM | POA: Diagnosis not present

## 2022-06-19 DIAGNOSIS — M5136 Other intervertebral disc degeneration, lumbar region: Secondary | ICD-10-CM | POA: Diagnosis not present

## 2022-06-19 DIAGNOSIS — Z853 Personal history of malignant neoplasm of breast: Secondary | ICD-10-CM | POA: Diagnosis not present

## 2022-06-19 DIAGNOSIS — Z8673 Personal history of transient ischemic attack (TIA), and cerebral infarction without residual deficits: Secondary | ICD-10-CM | POA: Diagnosis not present

## 2022-06-19 DIAGNOSIS — I4891 Unspecified atrial fibrillation: Secondary | ICD-10-CM | POA: Diagnosis not present

## 2022-06-19 DIAGNOSIS — J449 Chronic obstructive pulmonary disease, unspecified: Secondary | ICD-10-CM | POA: Diagnosis not present

## 2022-06-20 DIAGNOSIS — R55 Syncope and collapse: Secondary | ICD-10-CM | POA: Diagnosis not present

## 2022-06-20 DIAGNOSIS — Z4509 Encounter for adjustment and management of other cardiac device: Secondary | ICD-10-CM | POA: Diagnosis not present

## 2022-06-20 DIAGNOSIS — Z95818 Presence of other cardiac implants and grafts: Secondary | ICD-10-CM | POA: Diagnosis not present

## 2022-06-24 DIAGNOSIS — M5136 Other intervertebral disc degeneration, lumbar region: Secondary | ICD-10-CM | POA: Diagnosis not present

## 2022-06-24 DIAGNOSIS — E785 Hyperlipidemia, unspecified: Secondary | ICD-10-CM | POA: Diagnosis not present

## 2022-06-24 DIAGNOSIS — I4891 Unspecified atrial fibrillation: Secondary | ICD-10-CM | POA: Diagnosis not present

## 2022-06-24 DIAGNOSIS — S72002D Fracture of unspecified part of neck of left femur, subsequent encounter for closed fracture with routine healing: Secondary | ICD-10-CM | POA: Diagnosis not present

## 2022-06-24 DIAGNOSIS — Z853 Personal history of malignant neoplasm of breast: Secondary | ICD-10-CM | POA: Diagnosis not present

## 2022-06-24 DIAGNOSIS — Z96642 Presence of left artificial hip joint: Secondary | ICD-10-CM | POA: Diagnosis not present

## 2022-06-24 DIAGNOSIS — K219 Gastro-esophageal reflux disease without esophagitis: Secondary | ICD-10-CM | POA: Diagnosis not present

## 2022-06-24 DIAGNOSIS — J449 Chronic obstructive pulmonary disease, unspecified: Secondary | ICD-10-CM | POA: Diagnosis not present

## 2022-06-24 DIAGNOSIS — Z8673 Personal history of transient ischemic attack (TIA), and cerebral infarction without residual deficits: Secondary | ICD-10-CM | POA: Diagnosis not present

## 2022-06-25 DIAGNOSIS — I4891 Unspecified atrial fibrillation: Secondary | ICD-10-CM | POA: Diagnosis not present

## 2022-06-25 DIAGNOSIS — R030 Elevated blood-pressure reading, without diagnosis of hypertension: Secondary | ICD-10-CM | POA: Diagnosis not present

## 2022-06-25 DIAGNOSIS — Z6827 Body mass index (BMI) 27.0-27.9, adult: Secondary | ICD-10-CM | POA: Diagnosis not present

## 2022-06-25 DIAGNOSIS — F17210 Nicotine dependence, cigarettes, uncomplicated: Secondary | ICD-10-CM | POA: Diagnosis not present

## 2022-06-25 DIAGNOSIS — Z96642 Presence of left artificial hip joint: Secondary | ICD-10-CM | POA: Diagnosis not present

## 2022-06-25 DIAGNOSIS — M549 Dorsalgia, unspecified: Secondary | ICD-10-CM | POA: Diagnosis not present

## 2022-06-30 DIAGNOSIS — R079 Chest pain, unspecified: Secondary | ICD-10-CM | POA: Diagnosis not present

## 2022-06-30 DIAGNOSIS — I4891 Unspecified atrial fibrillation: Secondary | ICD-10-CM | POA: Diagnosis not present

## 2022-06-30 DIAGNOSIS — Z96642 Presence of left artificial hip joint: Secondary | ICD-10-CM | POA: Diagnosis not present

## 2022-07-02 ENCOUNTER — Ambulatory Visit (INDEPENDENT_AMBULATORY_CARE_PROVIDER_SITE_OTHER): Payer: Medicare HMO

## 2022-07-02 ENCOUNTER — Ambulatory Visit (INDEPENDENT_AMBULATORY_CARE_PROVIDER_SITE_OTHER): Payer: Medicare HMO | Admitting: Orthopaedic Surgery

## 2022-07-02 ENCOUNTER — Ambulatory Visit: Payer: Self-pay

## 2022-07-02 ENCOUNTER — Encounter: Payer: Self-pay | Admitting: Orthopaedic Surgery

## 2022-07-02 DIAGNOSIS — M25462 Effusion, left knee: Secondary | ICD-10-CM

## 2022-07-02 DIAGNOSIS — Z96642 Presence of left artificial hip joint: Secondary | ICD-10-CM

## 2022-07-02 NOTE — Progress Notes (Signed)
The patient is now 2 months status post a left total hip arthroplasty to treat a displaced left hip femoral neck fracture.  She has had multiple falls before the surgery and even since the surgery.  X-rays a week ago were obtained of her sacrum and pelvis as well as her lumbar spine.  She is having definitely pain over her coccyx.  She has had home therapy.  She is not interested in any more therapy in spite of her fall risk.  She does use a walker as well.  Her husband is with her today as well.  Her left operative hip moves smoothly and fluidly.  Both knees are slightly stiff but have decent motion.  There is definitely pain with her sitting on the coccyx area.  I did obtain x-rays of her left knee today that show only calcifications on the lateral meniscus with well-maintained joint space.  I did review the x-rays from last week and I did not see a coccyx fracture.  She does have severe degenerative changes in the lumbar spine and the lumbar degenerative scoliosis.  There is also severe degenerative disc disease at several levels.  An AP pelvis showed her hip replacement looks fine.  From my standpoint I do not need to see her back for 3 months unless she is having any issues.  No x-rays are needed at that visit since she just had x-rays.  I did offer outpatient physical therapy but she declined.

## 2022-07-03 DIAGNOSIS — Z853 Personal history of malignant neoplasm of breast: Secondary | ICD-10-CM | POA: Diagnosis not present

## 2022-07-03 DIAGNOSIS — S72002D Fracture of unspecified part of neck of left femur, subsequent encounter for closed fracture with routine healing: Secondary | ICD-10-CM | POA: Diagnosis not present

## 2022-07-03 DIAGNOSIS — M5136 Other intervertebral disc degeneration, lumbar region: Secondary | ICD-10-CM | POA: Diagnosis not present

## 2022-07-03 DIAGNOSIS — Z96642 Presence of left artificial hip joint: Secondary | ICD-10-CM | POA: Diagnosis not present

## 2022-07-03 DIAGNOSIS — K219 Gastro-esophageal reflux disease without esophagitis: Secondary | ICD-10-CM | POA: Diagnosis not present

## 2022-07-03 DIAGNOSIS — J449 Chronic obstructive pulmonary disease, unspecified: Secondary | ICD-10-CM | POA: Diagnosis not present

## 2022-07-03 DIAGNOSIS — I4891 Unspecified atrial fibrillation: Secondary | ICD-10-CM | POA: Diagnosis not present

## 2022-07-03 DIAGNOSIS — E785 Hyperlipidemia, unspecified: Secondary | ICD-10-CM | POA: Diagnosis not present

## 2022-07-03 DIAGNOSIS — Z8673 Personal history of transient ischemic attack (TIA), and cerebral infarction without residual deficits: Secondary | ICD-10-CM | POA: Diagnosis not present

## 2022-07-04 DIAGNOSIS — E785 Hyperlipidemia, unspecified: Secondary | ICD-10-CM | POA: Diagnosis not present

## 2022-07-04 DIAGNOSIS — Z853 Personal history of malignant neoplasm of breast: Secondary | ICD-10-CM | POA: Diagnosis not present

## 2022-07-04 DIAGNOSIS — K219 Gastro-esophageal reflux disease without esophagitis: Secondary | ICD-10-CM | POA: Diagnosis not present

## 2022-07-04 DIAGNOSIS — J449 Chronic obstructive pulmonary disease, unspecified: Secondary | ICD-10-CM | POA: Diagnosis not present

## 2022-07-04 DIAGNOSIS — Z8673 Personal history of transient ischemic attack (TIA), and cerebral infarction without residual deficits: Secondary | ICD-10-CM | POA: Diagnosis not present

## 2022-07-04 DIAGNOSIS — I4891 Unspecified atrial fibrillation: Secondary | ICD-10-CM | POA: Diagnosis not present

## 2022-07-04 DIAGNOSIS — Z96642 Presence of left artificial hip joint: Secondary | ICD-10-CM | POA: Diagnosis not present

## 2022-07-04 DIAGNOSIS — M5136 Other intervertebral disc degeneration, lumbar region: Secondary | ICD-10-CM | POA: Diagnosis not present

## 2022-07-04 DIAGNOSIS — S72002D Fracture of unspecified part of neck of left femur, subsequent encounter for closed fracture with routine healing: Secondary | ICD-10-CM | POA: Diagnosis not present

## 2022-07-07 ENCOUNTER — Ambulatory Visit: Payer: Medicare HMO | Admitting: Orthopaedic Surgery

## 2022-07-09 DIAGNOSIS — Z8673 Personal history of transient ischemic attack (TIA), and cerebral infarction without residual deficits: Secondary | ICD-10-CM | POA: Diagnosis not present

## 2022-07-09 DIAGNOSIS — S72002D Fracture of unspecified part of neck of left femur, subsequent encounter for closed fracture with routine healing: Secondary | ICD-10-CM | POA: Diagnosis not present

## 2022-07-09 DIAGNOSIS — M5136 Other intervertebral disc degeneration, lumbar region: Secondary | ICD-10-CM | POA: Diagnosis not present

## 2022-07-09 DIAGNOSIS — E785 Hyperlipidemia, unspecified: Secondary | ICD-10-CM | POA: Diagnosis not present

## 2022-07-09 DIAGNOSIS — I4891 Unspecified atrial fibrillation: Secondary | ICD-10-CM | POA: Diagnosis not present

## 2022-07-09 DIAGNOSIS — J449 Chronic obstructive pulmonary disease, unspecified: Secondary | ICD-10-CM | POA: Diagnosis not present

## 2022-07-09 DIAGNOSIS — Z853 Personal history of malignant neoplasm of breast: Secondary | ICD-10-CM | POA: Diagnosis not present

## 2022-07-09 DIAGNOSIS — K219 Gastro-esophageal reflux disease without esophagitis: Secondary | ICD-10-CM | POA: Diagnosis not present

## 2022-07-09 DIAGNOSIS — Z96642 Presence of left artificial hip joint: Secondary | ICD-10-CM | POA: Diagnosis not present

## 2022-07-10 DIAGNOSIS — R195 Other fecal abnormalities: Secondary | ICD-10-CM | POA: Diagnosis not present

## 2022-07-10 DIAGNOSIS — S72002D Fracture of unspecified part of neck of left femur, subsequent encounter for closed fracture with routine healing: Secondary | ICD-10-CM | POA: Diagnosis not present

## 2022-07-10 DIAGNOSIS — I4891 Unspecified atrial fibrillation: Secondary | ICD-10-CM | POA: Diagnosis not present

## 2022-07-10 DIAGNOSIS — J449 Chronic obstructive pulmonary disease, unspecified: Secondary | ICD-10-CM | POA: Diagnosis not present

## 2022-07-10 DIAGNOSIS — Z6830 Body mass index (BMI) 30.0-30.9, adult: Secondary | ICD-10-CM | POA: Diagnosis not present

## 2022-07-10 DIAGNOSIS — R030 Elevated blood-pressure reading, without diagnosis of hypertension: Secondary | ICD-10-CM | POA: Diagnosis not present

## 2022-07-10 DIAGNOSIS — I700 Atherosclerosis of aorta: Secondary | ICD-10-CM | POA: Diagnosis not present

## 2022-07-10 DIAGNOSIS — Z853 Personal history of malignant neoplasm of breast: Secondary | ICD-10-CM | POA: Diagnosis not present

## 2022-07-10 DIAGNOSIS — F17210 Nicotine dependence, cigarettes, uncomplicated: Secondary | ICD-10-CM | POA: Diagnosis not present

## 2022-07-10 DIAGNOSIS — Z96642 Presence of left artificial hip joint: Secondary | ICD-10-CM | POA: Diagnosis not present

## 2022-07-10 DIAGNOSIS — K5730 Diverticulosis of large intestine without perforation or abscess without bleeding: Secondary | ICD-10-CM | POA: Diagnosis not present

## 2022-07-10 DIAGNOSIS — M5136 Other intervertebral disc degeneration, lumbar region: Secondary | ICD-10-CM | POA: Diagnosis not present

## 2022-07-10 DIAGNOSIS — K219 Gastro-esophageal reflux disease without esophagitis: Secondary | ICD-10-CM | POA: Diagnosis not present

## 2022-07-10 DIAGNOSIS — D649 Anemia, unspecified: Secondary | ICD-10-CM | POA: Diagnosis not present

## 2022-07-10 DIAGNOSIS — Z8673 Personal history of transient ischemic attack (TIA), and cerebral infarction without residual deficits: Secondary | ICD-10-CM | POA: Diagnosis not present

## 2022-07-10 DIAGNOSIS — E119 Type 2 diabetes mellitus without complications: Secondary | ICD-10-CM | POA: Diagnosis not present

## 2022-07-10 DIAGNOSIS — E785 Hyperlipidemia, unspecified: Secondary | ICD-10-CM | POA: Diagnosis not present

## 2022-07-10 DIAGNOSIS — R109 Unspecified abdominal pain: Secondary | ICD-10-CM | POA: Diagnosis not present

## 2022-07-16 DIAGNOSIS — R1031 Right lower quadrant pain: Secondary | ICD-10-CM | POA: Diagnosis not present

## 2022-07-16 DIAGNOSIS — R1032 Left lower quadrant pain: Secondary | ICD-10-CM | POA: Diagnosis not present

## 2022-07-16 DIAGNOSIS — K921 Melena: Secondary | ICD-10-CM | POA: Diagnosis not present

## 2022-07-16 DIAGNOSIS — K219 Gastro-esophageal reflux disease without esophagitis: Secondary | ICD-10-CM | POA: Diagnosis not present

## 2022-07-17 ENCOUNTER — Other Ambulatory Visit (HOSPITAL_BASED_OUTPATIENT_CLINIC_OR_DEPARTMENT_OTHER): Payer: Self-pay | Admitting: Gastroenterology

## 2022-07-17 DIAGNOSIS — R1032 Left lower quadrant pain: Secondary | ICD-10-CM

## 2022-07-21 DIAGNOSIS — R55 Syncope and collapse: Secondary | ICD-10-CM | POA: Diagnosis not present

## 2022-07-21 DIAGNOSIS — Z4509 Encounter for adjustment and management of other cardiac device: Secondary | ICD-10-CM | POA: Diagnosis not present

## 2022-07-21 DIAGNOSIS — Z95818 Presence of other cardiac implants and grafts: Secondary | ICD-10-CM | POA: Diagnosis not present

## 2022-07-23 DIAGNOSIS — R739 Hyperglycemia, unspecified: Secondary | ICD-10-CM | POA: Diagnosis not present

## 2022-07-28 DIAGNOSIS — K219 Gastro-esophageal reflux disease without esophagitis: Secondary | ICD-10-CM | POA: Diagnosis not present

## 2022-07-28 DIAGNOSIS — R739 Hyperglycemia, unspecified: Secondary | ICD-10-CM | POA: Diagnosis not present

## 2022-07-28 DIAGNOSIS — I10 Essential (primary) hypertension: Secondary | ICD-10-CM | POA: Diagnosis not present

## 2022-07-30 DIAGNOSIS — R1031 Right lower quadrant pain: Secondary | ICD-10-CM | POA: Diagnosis not present

## 2022-07-30 DIAGNOSIS — K921 Melena: Secondary | ICD-10-CM | POA: Diagnosis not present

## 2022-07-30 DIAGNOSIS — R1032 Left lower quadrant pain: Secondary | ICD-10-CM | POA: Diagnosis not present

## 2022-07-30 DIAGNOSIS — K219 Gastro-esophageal reflux disease without esophagitis: Secondary | ICD-10-CM | POA: Diagnosis not present

## 2022-08-05 ENCOUNTER — Ambulatory Visit (HOSPITAL_COMMUNITY): Admission: RE | Admit: 2022-08-05 | Payer: Medicare HMO | Source: Ambulatory Visit

## 2022-08-05 ENCOUNTER — Encounter (HOSPITAL_COMMUNITY): Payer: Self-pay

## 2022-08-13 DIAGNOSIS — Z0001 Encounter for general adult medical examination with abnormal findings: Secondary | ICD-10-CM | POA: Diagnosis not present

## 2022-08-13 DIAGNOSIS — D649 Anemia, unspecified: Secondary | ICD-10-CM | POA: Diagnosis not present

## 2022-08-13 DIAGNOSIS — Z8673 Personal history of transient ischemic attack (TIA), and cerebral infarction without residual deficits: Secondary | ICD-10-CM | POA: Diagnosis not present

## 2022-08-13 DIAGNOSIS — R2681 Unsteadiness on feet: Secondary | ICD-10-CM | POA: Diagnosis not present

## 2022-08-13 DIAGNOSIS — R609 Edema, unspecified: Secondary | ICD-10-CM | POA: Diagnosis not present

## 2022-08-13 DIAGNOSIS — E7801 Familial hypercholesterolemia: Secondary | ICD-10-CM | POA: Diagnosis not present

## 2022-08-13 DIAGNOSIS — Z6822 Body mass index (BMI) 22.0-22.9, adult: Secondary | ICD-10-CM | POA: Diagnosis not present

## 2022-08-13 DIAGNOSIS — G912 (Idiopathic) normal pressure hydrocephalus: Secondary | ICD-10-CM | POA: Diagnosis not present

## 2022-08-13 DIAGNOSIS — M810 Age-related osteoporosis without current pathological fracture: Secondary | ICD-10-CM | POA: Diagnosis not present

## 2022-08-21 DIAGNOSIS — R55 Syncope and collapse: Secondary | ICD-10-CM | POA: Diagnosis not present

## 2022-08-21 DIAGNOSIS — Z95818 Presence of other cardiac implants and grafts: Secondary | ICD-10-CM | POA: Diagnosis not present

## 2022-08-21 DIAGNOSIS — Z4509 Encounter for adjustment and management of other cardiac device: Secondary | ICD-10-CM | POA: Diagnosis not present

## 2022-08-26 DIAGNOSIS — H353232 Exudative age-related macular degeneration, bilateral, with inactive choroidal neovascularization: Secondary | ICD-10-CM | POA: Diagnosis not present

## 2022-08-26 DIAGNOSIS — H3581 Retinal edema: Secondary | ICD-10-CM | POA: Diagnosis not present

## 2022-08-26 DIAGNOSIS — H35372 Puckering of macula, left eye: Secondary | ICD-10-CM | POA: Diagnosis not present

## 2022-08-26 DIAGNOSIS — H35363 Drusen (degenerative) of macula, bilateral: Secondary | ICD-10-CM | POA: Diagnosis not present

## 2022-09-08 DIAGNOSIS — M6281 Muscle weakness (generalized): Secondary | ICD-10-CM | POA: Diagnosis not present

## 2022-09-11 ENCOUNTER — Other Ambulatory Visit: Payer: Self-pay

## 2022-09-11 DIAGNOSIS — I6523 Occlusion and stenosis of bilateral carotid arteries: Secondary | ICD-10-CM

## 2022-09-11 MED ORDER — ATORVASTATIN CALCIUM 20 MG PO TABS: 20.0000 mg | ORAL_TABLET | Freq: Every day | ORAL | 3 refills | Status: DC

## 2022-09-12 ENCOUNTER — Ambulatory Visit: Payer: Medicare HMO | Admitting: Neurology

## 2022-09-18 DIAGNOSIS — M6281 Muscle weakness (generalized): Secondary | ICD-10-CM | POA: Diagnosis not present

## 2022-09-21 DIAGNOSIS — Z95818 Presence of other cardiac implants and grafts: Secondary | ICD-10-CM | POA: Diagnosis not present

## 2022-09-21 DIAGNOSIS — R55 Syncope and collapse: Secondary | ICD-10-CM | POA: Diagnosis not present

## 2022-09-21 DIAGNOSIS — Z4509 Encounter for adjustment and management of other cardiac device: Secondary | ICD-10-CM | POA: Diagnosis not present

## 2022-09-24 DIAGNOSIS — M6281 Muscle weakness (generalized): Secondary | ICD-10-CM | POA: Diagnosis not present

## 2022-09-25 DIAGNOSIS — M6281 Muscle weakness (generalized): Secondary | ICD-10-CM | POA: Diagnosis not present

## 2022-10-01 ENCOUNTER — Ambulatory Visit: Payer: Medicare HMO | Admitting: Orthopaedic Surgery

## 2022-10-02 DIAGNOSIS — M6281 Muscle weakness (generalized): Secondary | ICD-10-CM | POA: Diagnosis not present

## 2022-10-03 ENCOUNTER — Other Ambulatory Visit: Payer: Medicare HMO

## 2022-10-06 DIAGNOSIS — M6281 Muscle weakness (generalized): Secondary | ICD-10-CM | POA: Diagnosis not present

## 2022-10-09 DIAGNOSIS — M6281 Muscle weakness (generalized): Secondary | ICD-10-CM | POA: Diagnosis not present

## 2022-10-14 DIAGNOSIS — M6281 Muscle weakness (generalized): Secondary | ICD-10-CM | POA: Diagnosis not present

## 2022-10-15 ENCOUNTER — Ambulatory Visit: Payer: Medicare HMO | Admitting: Cardiology

## 2022-10-15 DIAGNOSIS — M6281 Muscle weakness (generalized): Secondary | ICD-10-CM | POA: Diagnosis not present

## 2022-10-15 DIAGNOSIS — R2681 Unsteadiness on feet: Secondary | ICD-10-CM | POA: Diagnosis not present

## 2022-10-15 DIAGNOSIS — G919 Hydrocephalus, unspecified: Secondary | ICD-10-CM | POA: Diagnosis not present

## 2022-10-16 ENCOUNTER — Other Ambulatory Visit: Payer: Medicare HMO

## 2022-10-22 ENCOUNTER — Ambulatory Visit: Payer: Medicare HMO | Admitting: Orthopaedic Surgery

## 2022-10-22 DIAGNOSIS — Z95818 Presence of other cardiac implants and grafts: Secondary | ICD-10-CM | POA: Diagnosis not present

## 2022-10-22 DIAGNOSIS — R55 Syncope and collapse: Secondary | ICD-10-CM | POA: Diagnosis not present

## 2022-10-22 DIAGNOSIS — Z4509 Encounter for adjustment and management of other cardiac device: Secondary | ICD-10-CM | POA: Diagnosis not present

## 2022-11-12 DIAGNOSIS — D6869 Other thrombophilia: Secondary | ICD-10-CM | POA: Diagnosis not present

## 2022-11-12 DIAGNOSIS — M810 Age-related osteoporosis without current pathological fracture: Secondary | ICD-10-CM | POA: Diagnosis not present

## 2022-11-12 DIAGNOSIS — G912 (Idiopathic) normal pressure hydrocephalus: Secondary | ICD-10-CM | POA: Diagnosis not present

## 2022-11-12 DIAGNOSIS — I480 Paroxysmal atrial fibrillation: Secondary | ICD-10-CM | POA: Diagnosis not present

## 2022-11-12 DIAGNOSIS — Z8673 Personal history of transient ischemic attack (TIA), and cerebral infarction without residual deficits: Secondary | ICD-10-CM | POA: Diagnosis not present

## 2022-11-12 DIAGNOSIS — R609 Edema, unspecified: Secondary | ICD-10-CM | POA: Diagnosis not present

## 2022-11-12 DIAGNOSIS — E7801 Familial hypercholesterolemia: Secondary | ICD-10-CM | POA: Diagnosis not present

## 2022-11-12 DIAGNOSIS — D649 Anemia, unspecified: Secondary | ICD-10-CM | POA: Diagnosis not present

## 2022-11-12 DIAGNOSIS — R2681 Unsteadiness on feet: Secondary | ICD-10-CM | POA: Diagnosis not present

## 2022-11-22 DIAGNOSIS — R55 Syncope and collapse: Secondary | ICD-10-CM | POA: Diagnosis not present

## 2022-11-22 DIAGNOSIS — Z4509 Encounter for adjustment and management of other cardiac device: Secondary | ICD-10-CM | POA: Diagnosis not present

## 2022-11-22 DIAGNOSIS — Z95818 Presence of other cardiac implants and grafts: Secondary | ICD-10-CM | POA: Diagnosis not present

## 2022-12-17 ENCOUNTER — Ambulatory Visit: Payer: Medicare HMO | Admitting: Cardiology

## 2022-12-23 DIAGNOSIS — R55 Syncope and collapse: Secondary | ICD-10-CM | POA: Diagnosis not present

## 2022-12-23 DIAGNOSIS — Z95818 Presence of other cardiac implants and grafts: Secondary | ICD-10-CM | POA: Diagnosis not present

## 2022-12-23 DIAGNOSIS — Z4509 Encounter for adjustment and management of other cardiac device: Secondary | ICD-10-CM | POA: Diagnosis not present

## 2023-01-06 DIAGNOSIS — H3581 Retinal edema: Secondary | ICD-10-CM | POA: Diagnosis not present

## 2023-01-06 DIAGNOSIS — H353232 Exudative age-related macular degeneration, bilateral, with inactive choroidal neovascularization: Secondary | ICD-10-CM | POA: Diagnosis not present

## 2023-01-06 DIAGNOSIS — H35372 Puckering of macula, left eye: Secondary | ICD-10-CM | POA: Diagnosis not present

## 2023-01-20 ENCOUNTER — Ambulatory Visit: Payer: Medicare HMO

## 2023-01-20 ENCOUNTER — Other Ambulatory Visit: Payer: Self-pay

## 2023-01-20 DIAGNOSIS — I6523 Occlusion and stenosis of bilateral carotid arteries: Secondary | ICD-10-CM

## 2023-01-20 DIAGNOSIS — I480 Paroxysmal atrial fibrillation: Secondary | ICD-10-CM

## 2023-01-20 DIAGNOSIS — Z8673 Personal history of transient ischemic attack (TIA), and cerebral infarction without residual deficits: Secondary | ICD-10-CM

## 2023-01-20 MED ORDER — APIXABAN 5 MG PO TABS: 5.0000 mg | ORAL_TABLET | Freq: Two times a day (BID) | ORAL | 1 refills | Status: DC

## 2023-01-26 NOTE — Progress Notes (Signed)
Carotid artery duplex 01/20/2023: Duplex suggests stenosis in the right internal carotid artery (minimal). No evidence of significant stenosis in the right external carotid artery. Duplex suggests stenosis in the left internal carotid artery (16-49%).  <50% stenosis in the left external carotid artery. Antegrade right vertebral artery flow. Antegrade left Compared to the study done on 12/23/2021, mild regression of carotid stenosis on the left from >50%.  There is mild to moderate diffuse heterogenous plaque in the bilateral ICA. vertebral artery flow. Follow up in one year is appropriate if clinically indicated.  I will discuss on her visit soon

## 2023-02-04 DIAGNOSIS — Z1329 Encounter for screening for other suspected endocrine disorder: Secondary | ICD-10-CM | POA: Diagnosis not present

## 2023-02-04 DIAGNOSIS — Z0001 Encounter for general adult medical examination with abnormal findings: Secondary | ICD-10-CM | POA: Diagnosis not present

## 2023-02-04 DIAGNOSIS — R739 Hyperglycemia, unspecified: Secondary | ICD-10-CM | POA: Diagnosis not present

## 2023-02-04 DIAGNOSIS — I10 Essential (primary) hypertension: Secondary | ICD-10-CM | POA: Diagnosis not present

## 2023-02-04 DIAGNOSIS — Z8673 Personal history of transient ischemic attack (TIA), and cerebral infarction without residual deficits: Secondary | ICD-10-CM | POA: Diagnosis not present

## 2023-02-04 DIAGNOSIS — E7801 Familial hypercholesterolemia: Secondary | ICD-10-CM | POA: Diagnosis not present

## 2023-02-04 DIAGNOSIS — E559 Vitamin D deficiency, unspecified: Secondary | ICD-10-CM | POA: Diagnosis not present

## 2023-02-24 ENCOUNTER — Ambulatory Visit: Payer: Medicare HMO | Admitting: Cardiology

## 2023-03-09 ENCOUNTER — Ambulatory Visit: Payer: Medicare HMO

## 2023-03-09 DIAGNOSIS — R55 Syncope and collapse: Secondary | ICD-10-CM

## 2023-03-09 LAB — CUP PACEART REMOTE DEVICE CHECK
Date Time Interrogation Session: 20241007130221
Implantable Pulse Generator Implant Date: 20230113
Pulse Gen Model: 436066
Pulse Gen Serial Number: 94078762

## 2023-03-26 NOTE — Progress Notes (Signed)
Biotronik Loop Recorder 

## 2023-05-11 DIAGNOSIS — H401131 Primary open-angle glaucoma, bilateral, mild stage: Secondary | ICD-10-CM | POA: Diagnosis not present

## 2023-05-11 DIAGNOSIS — H353232 Exudative age-related macular degeneration, bilateral, with inactive choroidal neovascularization: Secondary | ICD-10-CM | POA: Diagnosis not present

## 2023-05-11 DIAGNOSIS — H35372 Puckering of macula, left eye: Secondary | ICD-10-CM | POA: Diagnosis not present

## 2023-05-11 DIAGNOSIS — H3581 Retinal edema: Secondary | ICD-10-CM | POA: Diagnosis not present

## 2023-06-25 DIAGNOSIS — R69 Illness, unspecified: Secondary | ICD-10-CM | POA: Diagnosis not present

## 2023-07-23 DIAGNOSIS — Z6826 Body mass index (BMI) 26.0-26.9, adult: Secondary | ICD-10-CM | POA: Diagnosis not present

## 2023-07-23 DIAGNOSIS — N390 Urinary tract infection, site not specified: Secondary | ICD-10-CM | POA: Diagnosis not present

## 2023-07-23 DIAGNOSIS — R300 Dysuria: Secondary | ICD-10-CM | POA: Diagnosis not present

## 2023-07-23 DIAGNOSIS — I480 Paroxysmal atrial fibrillation: Secondary | ICD-10-CM | POA: Diagnosis not present

## 2023-08-19 ENCOUNTER — Telehealth: Payer: Self-pay | Admitting: Cardiology

## 2023-08-19 NOTE — Telephone Encounter (Signed)
 Per review of Pt's chart-last message received from her Biotronik loop recorder was December 29, 2022.  Her monitor deactivated on March 29, 2023 after 90 days of inactivity.  Per Pt she received the letter from Dr. Verl Dicker office advising he was leaving his practice and she became upset and unplugged her monitor.   Advised Pt that no information had been received from her monitor, but at her upcoming appointment we should be able to retrieve that information and get her re-enrolled in remote monitoring.  Pt is agreeable to upcoming appointment.

## 2023-08-19 NOTE — Telephone Encounter (Signed)
 Called patient to schedule appointment with Dr. Jimmey Ralph to establish care for loop, as former Dr. Jacinto Halim patient - patient is scheduled to see Dr. Jimmey Ralph on 4/11. She is requesting a report of her transmissions from the last year, as she is wanting to know if she has a 'problem'. She states after having the loop recorder for this long, she should know what it says and feels that she is entitled to know that information.

## 2023-08-31 DIAGNOSIS — D6869 Other thrombophilia: Secondary | ICD-10-CM | POA: Diagnosis not present

## 2023-08-31 DIAGNOSIS — Z0001 Encounter for general adult medical examination with abnormal findings: Secondary | ICD-10-CM | POA: Diagnosis not present

## 2023-08-31 DIAGNOSIS — E7800 Pure hypercholesterolemia, unspecified: Secondary | ICD-10-CM | POA: Diagnosis not present

## 2023-08-31 DIAGNOSIS — Z6827 Body mass index (BMI) 27.0-27.9, adult: Secondary | ICD-10-CM | POA: Diagnosis not present

## 2023-08-31 DIAGNOSIS — G912 (Idiopathic) normal pressure hydrocephalus: Secondary | ICD-10-CM | POA: Diagnosis not present

## 2023-08-31 DIAGNOSIS — Z8673 Personal history of transient ischemic attack (TIA), and cerebral infarction without residual deficits: Secondary | ICD-10-CM | POA: Diagnosis not present

## 2023-09-11 ENCOUNTER — Ambulatory Visit: Admitting: Cardiology

## 2023-09-17 DIAGNOSIS — R739 Hyperglycemia, unspecified: Secondary | ICD-10-CM | POA: Diagnosis not present

## 2023-09-17 DIAGNOSIS — G8929 Other chronic pain: Secondary | ICD-10-CM | POA: Diagnosis not present

## 2023-09-17 DIAGNOSIS — D649 Anemia, unspecified: Secondary | ICD-10-CM | POA: Diagnosis not present

## 2023-09-17 DIAGNOSIS — Z1329 Encounter for screening for other suspected endocrine disorder: Secondary | ICD-10-CM | POA: Diagnosis not present

## 2023-09-17 DIAGNOSIS — I10 Essential (primary) hypertension: Secondary | ICD-10-CM | POA: Diagnosis not present

## 2023-09-28 DIAGNOSIS — Z1331 Encounter for screening for depression: Secondary | ICD-10-CM | POA: Diagnosis not present

## 2023-09-28 DIAGNOSIS — Z1389 Encounter for screening for other disorder: Secondary | ICD-10-CM | POA: Diagnosis not present

## 2023-09-28 DIAGNOSIS — Z0001 Encounter for general adult medical examination with abnormal findings: Secondary | ICD-10-CM | POA: Diagnosis not present

## 2023-09-28 DIAGNOSIS — Z6827 Body mass index (BMI) 27.0-27.9, adult: Secondary | ICD-10-CM | POA: Diagnosis not present

## 2023-10-07 ENCOUNTER — Ambulatory Visit: Attending: Cardiology | Admitting: Cardiology

## 2023-10-07 ENCOUNTER — Encounter: Payer: Self-pay | Admitting: Cardiology

## 2023-10-07 VITALS — BP 148/76 | HR 89 | Ht 68.0 in | Wt 174.6 lb

## 2023-10-07 DIAGNOSIS — Z79899 Other long term (current) drug therapy: Secondary | ICD-10-CM

## 2023-10-07 DIAGNOSIS — I480 Paroxysmal atrial fibrillation: Secondary | ICD-10-CM

## 2023-10-07 DIAGNOSIS — Z9889 Other specified postprocedural states: Secondary | ICD-10-CM

## 2023-10-07 DIAGNOSIS — D6869 Other thrombophilia: Secondary | ICD-10-CM

## 2023-10-07 DIAGNOSIS — I872 Venous insufficiency (chronic) (peripheral): Secondary | ICD-10-CM | POA: Diagnosis not present

## 2023-10-07 NOTE — Patient Instructions (Signed)
 Medication Instructions:  Your physician recommends that you continue on your current medications as directed. Please refer to the Current Medication list given to you today.  *If you need a refill on your cardiac medications before your next appointment, please call your pharmacy*   Lab Work: TSH & LFTs If you have labs (blood work) drawn today and your tests are completely normal, you will receive your results only by: MyChart Message (if you have MyChart) OR A paper copy in the mail If you have any lab test that is abnormal or we need to change your treatment, we will call you to review the results.   Testing/Procedures: None ordered   Follow-Up: At [facility], you and your health needs are our priority.  As part of our continuing mission to provide you with exceptional heart care, we have created designated Provider Care Teams.  These Care Teams include your primary Cardiologist (physician) and Advanced Practice Providers (APPs -  Physician Assistants and Nurse Practitioners) who all work together to provide you with the care you need, when you need it.  You have been referred to Vascular surgery for your lower extremity swelling   Your next appointment:   1 year(s)  The format for your next appointment:   In Person  Provider:   Marlane Silver, MD  - to establish in [place]  Thank you for choosing [facility]!!   Reece Cane, RN [phone]

## 2023-10-07 NOTE — Progress Notes (Unsigned)
 Electrophysiology Office Note:   Date:  10/08/2023  ID:  [name], DOB March 27, 1946, MRN [id number]  Primary Cardiologist: None Electrophysiologist: None      History of Present Illness:   [name] is a 78 y.o. female with h/o remote pulmonary embolism, history of breast cancer SP lumpectomy followed by chemo and radiation therapy in 2003, history of TIA and cerebellar embolic stroke in 2019 and new stroke by MRI in 05/2021, normal pressure hydrocephalus on conservative therapy, chronic back pain and spina bifida and gait instability, COPD quit smoking on 05/10/2021, chronic bilateral lower extremity venous insufficiency, loop recorder implantation on 06/12/2021 for evaluation of syncope and also stroke who is being seen today to establish care for her loop recorder.   Discussed the use of AI scribe software for clinical note transcription with the patient, who gave verbal consent to proceed.  History of Present Illness [name] is a 78 year old female with atrial fibrillation who presents for follow-up regarding her loop recorder and atrial fibrillation management. She was previously followed by Dr. Berry Bristol.  She has a history of atrial fibrillation and is currently being monitored with a loop recorder, which has recorded her atrial fibrillation burden to be approximately one percent of the time. She has experienced 28 episodes of atrial fibrillation since December 2023. No recent symptomatic episodes of heart racing or fluttering, and she is able to perform her daily activities without limitations due to atrial fibrillation. She is currently on Eliquis  and amiodarone  for atrial fibrillation management and is tolerating these medications well. She was initially transmitting data from her loop recorder to Dr. Berry Bristol but is no longer interested in doing so and has unplugged her transmitter.  Her only complaint today is with her legs and feet, describing them as having 'floppy' veins and being  purplish in color, indicative of varicose veins. She finds compression hose difficult to put on, having even sprained her wrist in the attempt. She has tried lower strength compression socks without success and elevates her legs to manage symptoms. She has not seen improvement and is seeking referral back to vascular surgery further assistance for this issue.  Review of systems complete and found to be negative unless listed in HPI.   EP Information / Studies Reviewed:     Carotid Duplex 01/20/23: Carotid artery duplex 01/20/2023:  Duplex suggests stenosis in the right internal carotid artery (minimal).  No evidence of significant stenosis in the right external carotid artery.  Duplex suggests stenosis in the left internal carotid artery (16-49%).   <50% stenosis in the left external carotid artery.  Antegrade right vertebral artery flow. Antegrade left Compared to the  study done on 12/23/2021, mild regression of carotid stenosis on the left  from >50%.  There is mild to moderate diffuse heterogenous plaque in the  bilateral ICA. vertebral artery flow.  Follow up in one year is appropriate if clinically indicated.    Echocardiogram 05/06/2021: Left ventricle cavity is normal in size and wall thickness. Normal global wall motion. Normal LV systolic function with visual EF 64%. Doppler evidence of grade I (impaired) diastolic dysfunction, normal LAP. Left atrial cavity is moderately dilated. Mild mitral valve leaflet thickening. No evidence of mitral stenosis. Mild (Grade I) mitral regurgitation. Normal right atrial pressure. Compared to previous study in 2019, moderate LA dilatation is new finding. No other significant change noted.    Physical Exam:   VS:  BP (!) 148/76   Pulse 89  Ht 5\' 8"  (1.727 m)   Wt 174 lb 9.6 oz (79.2 kg)   SpO2 94%   BMI 26.55 kg/m    Wt Readings from Last 3 Encounters:  10/07/23 174 lb 9.6 oz (79.2 kg)  06/18/22 141 lb 9.6 oz (64.2 kg)  05/08/22 152  lb (68.9 kg)     GEN: Well nourished, well developed in no acute distress NECK: No JVD; No carotid bruits CARDIAC: Normal rate, regular rhythm RESPIRATORY:  Clear to auscultation without rales, wheezing or rhonchi  ABDOMEN: Soft, non-tender, non-distended EXTREMITIES:  1+ edema with venous stasis changes  ASSESSMENT AND PLAN:    #. Paroxysmal atrial fibrillation: Burden 1% on interrogation of her loop recorder. No significant symptoms.  #. S/p loop recorder: Interrogation performed.  -Continue amiodarone  100mg  once daily. Will check LFTs and TSH.  -Patient is not interested in re-enrolling in remote monitoring of her loop. We discussed that if symptoms were to worsen to notify device clinic so that we could schedule for interrogation.    #. Secondary hypercoagulable state due to atrial fibrillation: CHADSVASC score >3.  - Continue Eliquis  5mg  BID.    #. Bilateral venous insufficiency: Patient previously seen by [facility]. She states she cannot put on compression hose. She is requesting referral back to discuss other options.  - Refer back to [facility].  - Encouraged patient to elevate legs.  - Advised her to try lower pressure compression socks as these may be easier to put on.   Follow up with EP APP in 12 months  Signed, Ardeen Kohler, MD

## 2023-10-25 ENCOUNTER — Other Ambulatory Visit: Payer: Self-pay | Admitting: Cardiology

## 2023-10-25 DIAGNOSIS — I6523 Occlusion and stenosis of bilateral carotid arteries: Secondary | ICD-10-CM

## 2023-10-25 DIAGNOSIS — I480 Paroxysmal atrial fibrillation: Secondary | ICD-10-CM

## 2023-10-25 DIAGNOSIS — Z8673 Personal history of transient ischemic attack (TIA), and cerebral infarction without residual deficits: Secondary | ICD-10-CM

## 2023-10-27 DIAGNOSIS — Z1231 Encounter for screening mammogram for malignant neoplasm of breast: Secondary | ICD-10-CM | POA: Diagnosis not present

## 2023-10-27 NOTE — Telephone Encounter (Signed)
 Pt last saw Dr Daneil Dunker 10/07/23, last labs 05/06/22 Creat 0.48, pt is overdue for labwork.  Age 78, weight 79.2kg, based on specified criteria pt is on appropriate dosage of Eliquis  5mg  BID for afib.   Called pt to get labwork done, pt states she does not need a refill on her Eliquis  at this time, and would like for us  to refuse this refill.  She wants to discuss Eliquis  with MD before refilling.  Pt will contact pharmacy or our office when she needs a refill of her Eliquis .

## 2023-10-29 ENCOUNTER — Telehealth: Payer: Self-pay | Admitting: *Deleted

## 2023-10-29 DIAGNOSIS — Z79899 Other long term (current) drug therapy: Secondary | ICD-10-CM

## 2023-10-29 DIAGNOSIS — I480 Paroxysmal atrial fibrillation: Secondary | ICD-10-CM

## 2023-10-29 NOTE — Addendum Note (Signed)
 Addended by: Alvenia Aus on: 10/29/2023 10:50 AM   Modules accepted: Orders

## 2023-10-29 NOTE — Telephone Encounter (Signed)
 Informed pt that she needs Amiodarone  surveillance labs.  She will stop by [facility] in the next couple weeks to get TSH & LFTs.  She is agreeable to plan.

## 2023-11-02 ENCOUNTER — Other Ambulatory Visit: Payer: Self-pay | Admitting: Cardiology

## 2023-11-02 DIAGNOSIS — I6523 Occlusion and stenosis of bilateral carotid arteries: Secondary | ICD-10-CM

## 2023-11-18 DIAGNOSIS — D6869 Other thrombophilia: Secondary | ICD-10-CM | POA: Diagnosis not present

## 2023-11-18 DIAGNOSIS — Z6827 Body mass index (BMI) 27.0-27.9, adult: Secondary | ICD-10-CM | POA: Diagnosis not present

## 2023-11-18 DIAGNOSIS — E7800 Pure hypercholesterolemia, unspecified: Secondary | ICD-10-CM | POA: Diagnosis not present

## 2023-11-18 DIAGNOSIS — G912 (Idiopathic) normal pressure hydrocephalus: Secondary | ICD-10-CM | POA: Diagnosis not present

## 2023-11-18 DIAGNOSIS — Z8673 Personal history of transient ischemic attack (TIA), and cerebral infarction without residual deficits: Secondary | ICD-10-CM | POA: Diagnosis not present

## 2023-12-10 DIAGNOSIS — W19XXXA Unspecified fall, initial encounter: Secondary | ICD-10-CM | POA: Diagnosis not present

## 2023-12-10 DIAGNOSIS — R0689 Other abnormalities of breathing: Secondary | ICD-10-CM | POA: Diagnosis not present

## 2023-12-12 DIAGNOSIS — W19XXXA Unspecified fall, initial encounter: Secondary | ICD-10-CM | POA: Diagnosis not present

## 2023-12-12 DIAGNOSIS — R0781 Pleurodynia: Secondary | ICD-10-CM | POA: Diagnosis not present

## 2023-12-12 DIAGNOSIS — S298XXA Other specified injuries of thorax, initial encounter: Secondary | ICD-10-CM | POA: Diagnosis not present

## 2023-12-12 DIAGNOSIS — I480 Paroxysmal atrial fibrillation: Secondary | ICD-10-CM | POA: Diagnosis not present

## 2023-12-12 DIAGNOSIS — D6869 Other thrombophilia: Secondary | ICD-10-CM | POA: Diagnosis not present

## 2023-12-12 DIAGNOSIS — J9811 Atelectasis: Secondary | ICD-10-CM | POA: Diagnosis not present

## 2023-12-15 ENCOUNTER — Other Ambulatory Visit: Payer: Self-pay

## 2023-12-15 DIAGNOSIS — I83899 Varicose veins of unspecified lower extremities with other complications: Secondary | ICD-10-CM

## 2023-12-23 NOTE — Progress Notes (Deleted)
 Office Note     CC:  *** Requesting Provider:  Trudy Vaughn FALCON, MD  HPI: [name] is a 78 y.o. (1945/11/17) female who presents at the request of Trudy Vaughn FALCON, MD for evaluation of ***.   Venous symptoms include: positive if (X) [  ] aching [  ] heavy [  ] tired  [  ] throbbing [  ] burning  [  ] itching [  ]swelling [  ] bleeding [  ] ulcer  Onset/duration:  ***  Occupation:  *** Aggravating factors: (sitting, standing) Alleviating factors: (elevation) Compression:  *** Helps:  *** Pain medications:  *** Previous vein procedures:  *** History of DVT:  ***   The pt *** on a statin for cholesterol management.  The pt *** on a daily aspirin .   Other AC:  *** The pt *** on *** for hypertension.   The pt *** diabetic.  *** Tobacco hx:  ***  Past Medical History:  Diagnosis Date   Arthritis    Breast cancer (HCC)    left breast/lumpectomy/chemo/rad 2003   COPD (chronic obstructive pulmonary disease) (HCC)    DDD (degenerative disc disease), lumbar 08/31/2017   Diverticulitis    GERD (gastroesophageal reflux disease)    Hemoptysis    Hyperlipidemia    Iron deficiency    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 06/15/2021   Macular degeneration    bilateral   Personal history of radiation therapy 2003   Pulmonary embolism (HCC)    Syncope and collapse 05/17/2021   Tachycardia     Past Surgical History:  Procedure Laterality Date   BACK SURGERY     BREAST LUMPECTOMY     COLONOSCOPY  01/28/2012   Procedure: COLONOSCOPY;  Surgeon: Claudis RAYMOND Rivet, MD;  [facility]: [facility];  Service: Endoscopy;  Laterality: N/A;  1200   COLONOSCOPY  01-2012   ESOPHAGOGASTRODUODENOSCOPY N/A 07/01/2013   Procedure: ESOPHAGOGASTRODUODENOSCOPY (EGD);  Surgeon: Claudis RAYMOND Rivet, MD;  Location: [facility];  Service: Endoscopy;  Laterality: N/A;  1:10   LAPAROSCOPIC TUBAL LIGATION  1970   TONSILLECTOMY     Patient was age 76   TOTAL HIP ARTHROPLASTY Left 04/25/2022    Procedure: TOTAL HIP ARTHROPLASTY ANTERIOR APPROACH;  Surgeon: Vernetta Lonni GRADE, MD;  Location: [facility];  Service: Orthopedics;  Laterality: Left;    Social History   Socioeconomic History   Marital status: Married    Spouse name: Marinda   Number of children: 1   Years of education: some college   Highest education level: Not on file  Occupational History   Not on file  Tobacco Use   Smoking status: Some Days    Current packs/day: 0.50    Average packs/day: 0.5 packs/day for 30.0 years (15.0 ttl pk-yrs)    Types: Cigarettes   Smokeless tobacco: Never   Tobacco comments:    05/17/18 1- 1.5 PPD  Vaping Use   Vaping status: Never Used  Substance and Sexual Activity   Alcohol  use: No    Alcohol /week: 0.0 standard drinks of alcohol    Drug use: No   Sexual activity: Not Currently  Other Topics Concern   Not on file  Social History Narrative   Lives with husband   Caffeine- none   Right handed    Social Drivers of Health   Financial Resource Strain: Not on file  Food Insecurity: No Food Insecurity (04/23/2022)   Hunger Vital Sign    Worried About Running Out of  Food in the Last Year: Never true    Ran Out of Food in the Last Year: Never true  Transportation Needs: No Transportation Needs (04/23/2022)   PRAPARE - Administrator, Civil Service (Medical): No    Lack of Transportation (Non-Medical): No  Physical Activity: Not on file  Stress: Not on file  Social Connections: Not on file  Intimate Partner Violence: Not At Risk (04/23/2022)   Humiliation, Afraid, Rape, and Kick questionnaire    Fear of Current or Ex-Partner: No    Emotionally Abused: No    Physically Abused: No    Sexually Abused: No   *** Family History  Problem Relation Age of Onset   Heart disease Mother    Alzheimer's disease Mother    Heart disease Father    Lung disease Father    Healthy Son     Current Outpatient Medications  Medication Sig Dispense Refill   amiodarone   (PACERONE ) 200 MG tablet Take 0.5 tablets (100 mg total) by mouth daily. 45 tablet 1   apixaban  (ELIQUIS ) 5 MG TABS tablet Take 1 tablet (5 mg total) by mouth 2 (two) times daily. 180 tablet 1   atorvastatin  (LIPITOR) 20 MG tablet Take 1 tablet (20 mg total) by mouth daily. 100 tablet 3   Multiple Vitamin (MULTIVITAMIN WITH MINERALS) TABS tablet Take 1 tablet by mouth daily.     Multiple Vitamins-Minerals (PRESERVISION AREDS 2) CAPS Take by mouth 2 (two) times daily.      Omega-3 Fatty Acids (FISH OIL) 1000 MG CAPS Take by mouth daily.     No current facility-administered medications for this visit.    Allergies  Allergen Reactions   Sulfa Antibiotics Other (See Comments)    Childhood Allergy  05/17/18 patient denies     REVIEW OF SYSTEMS:  *** [X]  denotes positive finding, [ ]  denotes negative finding Cardiac  Comments:  Chest pain or chest pressure:    Shortness of breath upon exertion:    Short of breath when lying flat:    Irregular heart rhythm:        Vascular    Pain in calf, thigh, or hip brought on by ambulation:    Pain in feet at night that wakes you up from your sleep:     Blood clot in your veins:    Leg swelling:         Pulmonary    Oxygen at home:    Productive cough:     Wheezing:         Neurologic    Sudden weakness in arms or legs:     Sudden numbness in arms or legs:     Sudden onset of difficulty speaking or slurred speech:    Temporary loss of vision in one eye:     Problems with dizziness:         Gastrointestinal    Blood in stool:     Vomited blood:         Genitourinary    Burning when urinating:     Blood in urine:        Psychiatric    Major depression:         Hematologic    Bleeding problems:    Problems with blood clotting too easily:        Skin    Rashes or ulcers:        Constitutional    Fever or chills:      PHYSICAL EXAMINATION:  There were no vitals filed for this visit.  General:  WDWN in NAD; vital signs  documented above Gait: Not observed HENT: WNL, normocephalic Pulmonary: normal non-labored breathing , without Rales, rhonchi,  wheezing Cardiac: {Desc; regular/irreg:14544} HR, without  Murmurs {With/Without:20273} carotid bruit*** Abdomen: soft, NT, no masses Skin: {With/Without:20273} rashes Vascular Exam/Pulses:  Right Left  Radial {Exam; arterial pulse strength 0-4:30167} {Exam; arterial pulse strength 0-4:30167}  Ulnar {Exam; arterial pulse strength 0-4:30167} {Exam; arterial pulse strength 0-4:30167}  Femoral {Exam; arterial pulse strength 0-4:30167} {Exam; arterial pulse strength 0-4:30167}  Popliteal {Exam; arterial pulse strength 0-4:30167} {Exam; arterial pulse strength 0-4:30167}  DP {Exam; arterial pulse strength 0-4:30167} {Exam; arterial pulse strength 0-4:30167}  PT {Exam; arterial pulse strength 0-4:30167} {Exam; arterial pulse strength 0-4:30167}   Extremities: {With/Without:20273} ischemic changes, {With/Without:20273} Gangrene , {With/Without:20273} cellulitis; {With/Without:20273} open wounds;  Musculoskeletal: no muscle wasting or atrophy  Neurologic: A&O X 3;  No focal weakness or paresthesias are detected Psychiatric:  The pt has {Desc; normal/abnormal:11317::Normal} affect.   Non-Invasive Vascular Imaging:   ***    ASSESSMENT/PLAN:: 78 y.o. female presenting with ***   ***   Fonda FORBES Rim, MD [facility] [phone]

## 2023-12-24 ENCOUNTER — Ambulatory Visit (HOSPITAL_COMMUNITY)

## 2023-12-24 ENCOUNTER — Ambulatory Visit: Admitting: Vascular Surgery

## 2024-03-08 DIAGNOSIS — D649 Anemia, unspecified: Secondary | ICD-10-CM | POA: Diagnosis not present

## 2024-03-08 DIAGNOSIS — I10 Essential (primary) hypertension: Secondary | ICD-10-CM | POA: Diagnosis not present

## 2024-03-08 DIAGNOSIS — R7303 Prediabetes: Secondary | ICD-10-CM | POA: Diagnosis not present

## 2024-03-08 DIAGNOSIS — K219 Gastro-esophageal reflux disease without esophagitis: Secondary | ICD-10-CM | POA: Diagnosis not present

## 2024-03-08 DIAGNOSIS — Z1329 Encounter for screening for other suspected endocrine disorder: Secondary | ICD-10-CM | POA: Diagnosis not present

## 2024-03-08 DIAGNOSIS — R739 Hyperglycemia, unspecified: Secondary | ICD-10-CM | POA: Diagnosis not present

## 2024-03-08 DIAGNOSIS — Z13220 Encounter for screening for lipoid disorders: Secondary | ICD-10-CM | POA: Diagnosis not present

## 2024-03-08 DIAGNOSIS — E559 Vitamin D deficiency, unspecified: Secondary | ICD-10-CM | POA: Diagnosis not present

## 2024-03-31 DIAGNOSIS — Z6826 Body mass index (BMI) 26.0-26.9, adult: Secondary | ICD-10-CM | POA: Diagnosis not present

## 2024-03-31 DIAGNOSIS — G912 (Idiopathic) normal pressure hydrocephalus: Secondary | ICD-10-CM | POA: Diagnosis not present

## 2024-03-31 DIAGNOSIS — D6869 Other thrombophilia: Secondary | ICD-10-CM | POA: Diagnosis not present

## 2024-03-31 DIAGNOSIS — Z8673 Personal history of transient ischemic attack (TIA), and cerebral infarction without residual deficits: Secondary | ICD-10-CM | POA: Diagnosis not present

## 2024-03-31 DIAGNOSIS — I480 Paroxysmal atrial fibrillation: Secondary | ICD-10-CM | POA: Diagnosis not present

## 2024-04-12 DIAGNOSIS — W19XXXA Unspecified fall, initial encounter: Secondary | ICD-10-CM | POA: Diagnosis not present

## 2024-04-12 DIAGNOSIS — R531 Weakness: Secondary | ICD-10-CM | POA: Diagnosis not present

## 2024-04-14 DIAGNOSIS — E785 Hyperlipidemia, unspecified: Secondary | ICD-10-CM | POA: Diagnosis not present

## 2024-04-14 DIAGNOSIS — D6869 Other thrombophilia: Secondary | ICD-10-CM | POA: Diagnosis not present

## 2024-04-14 DIAGNOSIS — I480 Paroxysmal atrial fibrillation: Secondary | ICD-10-CM | POA: Diagnosis not present

## 2024-04-14 DIAGNOSIS — G912 (Idiopathic) normal pressure hydrocephalus: Secondary | ICD-10-CM | POA: Diagnosis not present

## 2024-04-14 DIAGNOSIS — I739 Peripheral vascular disease, unspecified: Secondary | ICD-10-CM | POA: Diagnosis not present

## 2024-04-14 DIAGNOSIS — Z0001 Encounter for general adult medical examination with abnormal findings: Secondary | ICD-10-CM | POA: Diagnosis not present

## 2024-04-14 DIAGNOSIS — R269 Unspecified abnormalities of gait and mobility: Secondary | ICD-10-CM | POA: Diagnosis not present

## 2024-04-14 DIAGNOSIS — R7303 Prediabetes: Secondary | ICD-10-CM | POA: Diagnosis not present

## 2024-04-14 DIAGNOSIS — Z8673 Personal history of transient ischemic attack (TIA), and cerebral infarction without residual deficits: Secondary | ICD-10-CM | POA: Diagnosis not present

## 2024-04-15 ENCOUNTER — Encounter: Payer: Self-pay | Admitting: *Deleted

## 2024-04-15 ENCOUNTER — Other Ambulatory Visit (HOSPITAL_COMMUNITY): Payer: Self-pay | Admitting: Internal Medicine

## 2024-04-15 DIAGNOSIS — G912 (Idiopathic) normal pressure hydrocephalus: Secondary | ICD-10-CM

## 2024-04-20 ENCOUNTER — Ambulatory Visit (HOSPITAL_COMMUNITY)
Admission: RE | Admit: 2024-04-20 | Discharge: 2024-04-20 | Disposition: A | Discharge status: Home / Self Care | Source: Ambulatory Visit | Attending: Internal Medicine | Admitting: Internal Medicine

## 2024-04-20 DIAGNOSIS — I639 Cerebral infarction, unspecified: Secondary | ICD-10-CM | POA: Diagnosis not present

## 2024-04-20 DIAGNOSIS — G912 (Idiopathic) normal pressure hydrocephalus: Secondary | ICD-10-CM | POA: Diagnosis not present

## 2024-04-20 DIAGNOSIS — G9389 Other specified disorders of brain: Secondary | ICD-10-CM | POA: Diagnosis not present

## 2024-04-20 DIAGNOSIS — I672 Cerebral atherosclerosis: Secondary | ICD-10-CM | POA: Diagnosis not present

## 2024-05-12 ENCOUNTER — Institutional Professional Consult (permissible substitution): Admitting: Diagnostic Neuroimaging

## 2024-05-22 ENCOUNTER — Emergency Department (HOSPITAL_COMMUNITY)

## 2024-05-22 ENCOUNTER — Observation Stay (HOSPITAL_COMMUNITY)
Admission: EM | Admit: 2024-05-22 | Discharge: 2024-05-23 | Disposition: A | Discharge status: Home / Self Care | Attending: Internal Medicine | Admitting: Internal Medicine

## 2024-05-22 ENCOUNTER — Other Ambulatory Visit: Payer: Self-pay

## 2024-05-22 ENCOUNTER — Encounter (HOSPITAL_COMMUNITY): Payer: Self-pay | Admitting: Emergency Medicine

## 2024-05-22 DIAGNOSIS — W19XXXA Unspecified fall, initial encounter: Secondary | ICD-10-CM

## 2024-05-22 DIAGNOSIS — F17210 Nicotine dependence, cigarettes, uncomplicated: Secondary | ICD-10-CM | POA: Insufficient documentation

## 2024-05-22 DIAGNOSIS — J9601 Acute respiratory failure with hypoxia: Principal | ICD-10-CM | POA: Insufficient documentation

## 2024-05-22 DIAGNOSIS — R7989 Other specified abnormal findings of blood chemistry: Secondary | ICD-10-CM | POA: Diagnosis not present

## 2024-05-22 DIAGNOSIS — J449 Chronic obstructive pulmonary disease, unspecified: Secondary | ICD-10-CM | POA: Insufficient documentation

## 2024-05-22 DIAGNOSIS — E785 Hyperlipidemia, unspecified: Secondary | ICD-10-CM | POA: Diagnosis not present

## 2024-05-22 DIAGNOSIS — Y92009 Unspecified place in unspecified non-institutional (private) residence as the place of occurrence of the external cause: Secondary | ICD-10-CM | POA: Diagnosis not present

## 2024-05-22 DIAGNOSIS — Z79899 Other long term (current) drug therapy: Secondary | ICD-10-CM | POA: Diagnosis not present

## 2024-05-22 DIAGNOSIS — Z86711 Personal history of pulmonary embolism: Secondary | ICD-10-CM | POA: Insufficient documentation

## 2024-05-22 DIAGNOSIS — Z7901 Long term (current) use of anticoagulants: Secondary | ICD-10-CM | POA: Insufficient documentation

## 2024-05-22 DIAGNOSIS — R197 Diarrhea, unspecified: Secondary | ICD-10-CM | POA: Insufficient documentation

## 2024-05-22 DIAGNOSIS — R32 Unspecified urinary incontinence: Secondary | ICD-10-CM | POA: Insufficient documentation

## 2024-05-22 DIAGNOSIS — I480 Paroxysmal atrial fibrillation: Secondary | ICD-10-CM | POA: Diagnosis not present

## 2024-05-22 DIAGNOSIS — Z8673 Personal history of transient ischemic attack (TIA), and cerebral infarction without residual deficits: Secondary | ICD-10-CM | POA: Insufficient documentation

## 2024-05-22 DIAGNOSIS — J101 Influenza due to other identified influenza virus with other respiratory manifestations: Secondary | ICD-10-CM | POA: Diagnosis not present

## 2024-05-22 DIAGNOSIS — G8929 Other chronic pain: Secondary | ICD-10-CM | POA: Diagnosis not present

## 2024-05-22 DIAGNOSIS — R531 Weakness: Secondary | ICD-10-CM

## 2024-05-22 DIAGNOSIS — G549 Nerve root and plexus disorder, unspecified: Secondary | ICD-10-CM | POA: Insufficient documentation

## 2024-05-22 LAB — HEPATIC FUNCTION PANEL
ALT: 12 U/L (ref 0–44)
AST: 21 U/L (ref 15–41)
Albumin: 4 g/dL (ref 3.5–5.0)
Alkaline Phosphatase: 100 U/L (ref 38–126)
Bilirubin, Direct: 0.2 mg/dL (ref 0.0–0.2)
Indirect Bilirubin: 0.2 mg/dL — ABNORMAL LOW (ref 0.3–0.9)
Total Bilirubin: 0.3 mg/dL (ref 0.0–1.2)
Total Protein: 6.8 g/dL (ref 6.5–8.1)

## 2024-05-22 LAB — BASIC METABOLIC PANEL WITH GFR
Anion gap: 14 (ref 5–15)
BUN: 15 mg/dL (ref 8–23)
CO2: 22 mmol/L (ref 22–32)
Calcium: 9 mg/dL (ref 8.9–10.3)
Chloride: 100 mmol/L (ref 98–111)
Creatinine, Ser: 0.6 mg/dL (ref 0.44–1.00)
GFR, Estimated: >60 mL/min
Glucose, Bld: 106 mg/dL — ABNORMAL HIGH (ref 70–99)
Potassium: 3.8 mmol/L (ref 3.5–5.1)
Sodium: 136 mmol/L (ref 135–145)

## 2024-05-22 LAB — RESP PANEL BY RT-PCR (RSV, FLU A&B, COVID)  RVPGX2
Influenza A by PCR: POSITIVE — AB
Influenza B by PCR: NEGATIVE
Resp Syncytial Virus by PCR: NEGATIVE
SARS Coronavirus 2 by RT PCR: NEGATIVE

## 2024-05-22 LAB — CBC WITH DIFFERENTIAL/PLATELET
Abs Immature Granulocytes: 0.05 K/uL (ref 0.00–0.07)
Basophils Absolute: 0 K/uL (ref 0.0–0.1)
Basophils Relative: 1 %
Eosinophils Absolute: 0 K/uL (ref 0.0–0.5)
Eosinophils Relative: 0 %
HCT: 38.9 % (ref 36.0–46.0)
Hemoglobin: 12.8 g/dL (ref 12.0–15.0)
Immature Granulocytes: 1 %
Lymphocytes Relative: 9 %
Lymphs Abs: 0.6 K/uL — ABNORMAL LOW (ref 0.7–4.0)
MCH: 31 pg (ref 26.0–34.0)
MCHC: 32.9 g/dL (ref 30.0–36.0)
MCV: 94.2 fL (ref 80.0–100.0)
Monocytes Absolute: 0.7 K/uL (ref 0.1–1.0)
Monocytes Relative: 10 %
Neutro Abs: 5.2 K/uL (ref 1.7–7.7)
Neutrophils Relative %: 79 %
Platelets: 253 K/uL (ref 150–400)
RBC: 4.13 MIL/uL (ref 3.87–5.11)
RDW: 12.9 % (ref 11.5–15.5)
WBC: 6.6 K/uL (ref 4.0–10.5)
nRBC: 0 % (ref 0.0–0.2)

## 2024-05-22 LAB — URINALYSIS, W/ REFLEX TO CULTURE (INFECTION SUSPECTED)
Bilirubin Urine: NEGATIVE
Glucose, UA: NEGATIVE mg/dL
Hgb urine dipstick: NEGATIVE
Ketones, ur: NEGATIVE mg/dL
Leukocytes,Ua: NEGATIVE
Nitrite: NEGATIVE
Protein, ur: 30 mg/dL — AB
Specific Gravity, Urine: 1.029 (ref 1.005–1.030)
pH: 5 (ref 5.0–8.0)

## 2024-05-22 LAB — LIPASE, BLOOD: Lipase: 23 U/L (ref 11–51)

## 2024-05-22 LAB — TROPONIN T, HIGH SENSITIVITY
Troponin T High Sensitivity: 63 ng/L — ABNORMAL HIGH (ref 0–19)
Troponin T High Sensitivity: 59 ng/L — ABNORMAL HIGH (ref 0–19)

## 2024-05-22 MED ORDER — ONDANSETRON HCL 4 MG/2ML IJ SOLN
4.0000 mg | Freq: Once | INTRAMUSCULAR | Status: AC
  Administered 2024-05-22: 4 mg via INTRAVENOUS
  Filled 2024-05-22: qty 2

## 2024-05-22 MED ORDER — ACETAMINOPHEN 325 MG PO TABS
650.0000 mg | ORAL_TABLET | Freq: Four times a day (QID) | ORAL | Status: DC | PRN
  Administered 2024-05-23: 650 mg via ORAL
  Filled 2024-05-22: qty 2

## 2024-05-22 MED ORDER — ONDANSETRON HCL 4 MG/2ML IJ SOLN: 4.0000 mg | Freq: Four times a day (QID) | INTRAMUSCULAR | Status: DC | PRN

## 2024-05-22 MED ORDER — AMIODARONE HCL 200 MG PO TABS
100.0000 mg | ORAL_TABLET | Freq: Every day | ORAL | Status: DC
  Administered 2024-05-22 – 2024-05-23 (×2): 100 mg via ORAL
  Filled 2024-05-22 (×2): qty 1

## 2024-05-22 MED ORDER — SODIUM CHLORIDE 0.9 % IV SOLN: INTRAVENOUS | Status: AC

## 2024-05-22 MED ORDER — ALBUTEROL SULFATE HFA 108 (90 BASE) MCG/ACT IN AERS: 1.0000 | INHALATION_SPRAY | RESPIRATORY_TRACT | Status: DC | PRN

## 2024-05-22 MED ORDER — OSELTAMIVIR PHOSPHATE 75 MG PO CAPS
75.0000 mg | ORAL_CAPSULE | Freq: Two times a day (BID) | ORAL | Status: DC
  Administered 2024-05-22 – 2024-05-23 (×2): 75 mg via ORAL
  Filled 2024-05-22 (×2): qty 1

## 2024-05-22 MED ORDER — ONDANSETRON HCL 4 MG PO TABS: 4.0000 mg | ORAL_TABLET | Freq: Four times a day (QID) | ORAL | Status: DC | PRN

## 2024-05-22 MED ORDER — LACTATED RINGERS IV BOLUS
500.0000 mL | Freq: Once | INTRAVENOUS | Status: AC
  Administered 2024-05-22: 500 mL via INTRAVENOUS

## 2024-05-22 MED ORDER — APIXABAN 5 MG PO TABS
5.0000 mg | ORAL_TABLET | Freq: Two times a day (BID) | ORAL | Status: DC
  Administered 2024-05-22 – 2024-05-23 (×2): 5 mg via ORAL
  Filled 2024-05-22 (×2): qty 1

## 2024-05-22 MED ORDER — GUAIFENESIN-DM 100-10 MG/5ML PO SYRP
15.0000 mL | ORAL_SOLUTION | Freq: Three times a day (TID) | ORAL | Status: AC
  Administered 2024-05-22 – 2024-05-23 (×3): 15 mL via ORAL
  Filled 2024-05-22 (×3): qty 15

## 2024-05-22 MED ORDER — ACETAMINOPHEN 650 MG RE SUPP: 650.0000 mg | Freq: Four times a day (QID) | RECTAL | Status: DC | PRN

## 2024-05-22 MED ORDER — POLYETHYLENE GLYCOL 3350 17 G PO PACK: 17.0000 g | PACK | Freq: Every day | ORAL | Status: DC | PRN

## 2024-05-22 MED ORDER — OSELTAMIVIR PHOSPHATE 75 MG PO CAPS: 75.0000 mg | ORAL_CAPSULE | Freq: Two times a day (BID) | ORAL | 0 refills | Status: DC

## 2024-05-22 NOTE — Progress Notes (Signed)
"  °  Progress Note   Patient: [name] FMW:[id number] DOB: Feb 03, 1946 DOA: 05/22/2024     0 DOS: the patient was seen and examined on 05/22/2024   Brief hospital course: Patient had presented to the hospital secondary to influenza A with borderline hypoxia as patient on exertion.  At this particular moment after discussing with Dr. Gennaro plan will be for [facility] to get involved and secure oxygen supplementation for patient at discharge.  She can also complete 1 week of Tamiflu  to assist with her symptoms.  There is no really a need for hospitalization just to get her on oxygen.  Per ED provider patient will also love that.  [facility] will remain available if needed in case something changes or patient is unable to be safely discharge.  For now requested consultation will be canceled.  Author: Eric Nunnery, MD 05/22/2024 3:05 PM  For on call review [email or web address].    "

## 2024-05-22 NOTE — ED Triage Notes (Signed)
 Per [facility] pt coming from home- called out for fall. Found patient on the floor in the bathroom c/o nausea and dizziness. States she has been A&0x4 just slower to respond. Reports difficulty getting out of bed which is unusual for her. On eliquis . 22G LFA. Patient denies hitting head when falling. C/o lower back pain and states this is normal for her.

## 2024-05-22 NOTE — Plan of Care (Signed)

## 2024-05-22 NOTE — H&P (Signed)
 " History and Physical    [name] FMW:[id number] DOB: May 16, 1946 DOA: 05/22/2024  PCP: Trudy Vaughn FALCON, MD   Patient coming from: Home  I have personally briefly reviewed patient's old medical records in [facility]  Chief Complaint: Weakness, cough, fall.  HPI: [name] is a 78 y.o. female with medical history significant for atrial fibrillation on chronic anticoagulation, CVA, pulmonary embolism. Patient presented to ED with complaints of nausea, dizziness, and a fall this morning in her bathroom on ceramic floor.  She is not sure why she slipped and fell, but she fell with a walker she was using and onto the commode.  She reports dizziness this morning, with cough.  She also feels her lower extremities are weaker than baseline. She denies difficulty breathing, no chest pain, no congestion.  Smoked cigarettes until 3 years ago when she quit.  ED Course: Temperature 97.7.  Heart rate 68-79.  Respiratory rate 15-20.  Blood pressure systolic 130 -136.  O2 sats down to 88% on room air, placed on 2 L sats 91%. Chest x-ray chronic interstitial coarsening. MRI brain/CT head shows remote right cerebellar infarct. MRI lumbar spine-no acute abnormality. 500 mL bolus given. Plan was for home O2 to be arranged by [facility] so patient can be discharged from the ED, but patient was very weak in the ED, with dizziness, unable to ambulate in the ED hence hospitalization requested.   Review of Systems: As per HPI all other systems reviewed and negative.  Past Medical History:  Diagnosis Date   Arthritis    Breast cancer (HCC)    left breast/lumpectomy/chemo/rad 2003   COPD (chronic obstructive pulmonary disease) (HCC)    DDD (degenerative disc disease), lumbar 08/31/2017   Diverticulitis    GERD (gastroesophageal reflux disease)    Hemoptysis    Hyperlipidemia    Iron deficiency    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 06/15/2021   Macular degeneration    bilateral   Personal  history of radiation therapy 2003   Pulmonary embolism (HCC)    Syncope and collapse 05/17/2021   Tachycardia     Past Surgical History:  Procedure Laterality Date   BACK SURGERY     BREAST LUMPECTOMY     COLONOSCOPY  01/28/2012   Procedure: COLONOSCOPY;  Surgeon: Claudis RAYMOND Rivet, MD;  [facility];  Service: Endoscopy;  Laterality: N/A;  1200   COLONOSCOPY  01-2012   ESOPHAGOGASTRODUODENOSCOPY N/A 07/01/2013   Procedure: ESOPHAGOGASTRODUODENOSCOPY (EGD);  Surgeon: Claudis RAYMOND Rivet, MD;  [facility];  Service: Endoscopy;  Laterality: N/A;  1:10   LAPAROSCOPIC TUBAL LIGATION  1970   TONSILLECTOMY     Patient was age 60   TOTAL HIP ARTHROPLASTY Left 04/25/2022   Procedure: TOTAL HIP ARTHROPLASTY ANTERIOR APPROACH;  Surgeon: Vernetta Lonni GRADE, MD;  Location: [facility];  Service: Orthopedics;  Laterality: Left;     reports that she has been smoking cigarettes. She has a 15 pack-year smoking history. She has never used smokeless tobacco. She reports that she does not drink alcohol  and does not use drugs.  Allergies[1]  Family History  Problem Relation Age of Onset   Heart disease Mother    Alzheimer's disease Mother    Heart disease Father    Lung disease Father    Healthy Son     Prior to Admission medications  Medication Sig Start Date End Date Taking? Authorizing Provider  amiodarone  (PACERONE ) 200 MG tablet Take 0.5 tablets (100 mg  total) by mouth daily. 06/18/22  Yes Ladona Heinz, MD  apixaban  (ELIQUIS ) 5 MG TABS tablet Take 1 tablet (5 mg total) by mouth 2 (two) times daily. 01/20/23  Yes Ladona Heinz, MD  atorvastatin  (LIPITOR) 20 MG tablet Take 1 tablet (20 mg total) by mouth daily. 11/03/23  Yes Ladona Heinz, MD  oseltamivir  (TAMIFLU ) 75 MG capsule Take 1 capsule (75 mg total) by mouth every 12 (twelve) hours. 05/22/24  Yes Kammerer, Megan L, DO  Multiple Vitamin (MULTIVITAMIN WITH MINERALS) TABS tablet Take 1 tablet by mouth daily.    [provider]   Multiple Vitamins-Minerals (PRESERVISION AREDS 2) CAPS Take by mouth 2 (two) times daily.     [provider]  Omega-3 Fatty Acids (FISH OIL) 1000 MG CAPS Take by mouth daily.    [provider]    Physical Exam: Vitals:   05/22/24 1200 05/22/24 1344 05/22/24 1430 05/22/24 1500  BP: (!) 134/57 (!) 132/51 (!) 136/58   Pulse: 73 68 71   Resp: 19  16   Temp:    98.2 F (36.8 C)  TempSrc:    Oral  SpO2: 94% 90% 91%   Weight:      Height:        Constitutional: NAD, calm, comfortable Vitals:   05/22/24 1200 05/22/24 1344 05/22/24 1430 05/22/24 1500  BP: (!) 134/57 (!) 132/51 (!) 136/58   Pulse: 73 68 71   Resp: 19  16   Temp:    98.2 F (36.8 C)  TempSrc:    Oral  SpO2: 94% 90% 91%   Weight:      Height:       Eyes: PERRL, lids and conjunctivae normal ENMT: Mucous membranes are moist.  Neck: normal, supple, no masses, no thyromegaly Respiratory: clear to auscultation bilaterally, no wheezing, no crackles. Normal respiratory effort. No accessory muscle use.  Cardiovascular: Regular rate and rhythm, no murmurs / rubs / gallops. No extremity edema.  Extremities warm. Abdomen: no tenderness, no masses palpated. No hepatosplenomegaly. Bowel sounds positive.  Musculoskeletal: no clubbing / cyanosis. No joint deformity upper and lower extremities.  Skin: no rashes, lesions, ulcers. No induration Neurologic: No facial asymmetry, fluent speech-2/5 strength to right lower extremity, 0/5 strength left lower extremity.  She is unable to move her left lower extremity.  Reports some pain to her left lower extremity. Psychiatric: Normal judgment and insight. Alert and oriented x 3. Normal mood.   Labs on Admission: I have personally reviewed following labs and imaging studies  CBC: Recent Labs  Lab 05/22/24 1240  WBC 6.6  NEUTROABS 5.2  HGB 12.8  HCT 38.9  MCV 94.2  PLT 253   Basic Metabolic Panel: Recent Labs  Lab 05/22/24 1240  NA 136  K 3.8  CL 100   CO2 22  GLUCOSE 106*  BUN 15  CREATININE 0.60  CALCIUM  9.0   GFR: Estimated Creatinine Clearance: 61.9 mL/min (by C-G formula based on SCr of 0.6 mg/dL). Liver Function Tests: Recent Labs  Lab 05/22/24 1240  AST 21  ALT 12  ALKPHOS 100  BILITOT 0.3  PROT 6.8  ALBUMIN 4.0   Recent Labs  Lab 05/22/24 1240  LIPASE 23   Urine analysis:    Component Value Date/Time   COLORURINE AMBER (A) 05/22/2024 1405   APPEARANCEUR TURBID (A) 05/22/2024 1405   LABSPEC 1.029 05/22/2024 1405   PHURINE 5.0 05/22/2024 1405   GLUCOSEU NEGATIVE 05/22/2024 1405   HGBUR NEGATIVE 05/22/2024 1405   BILIRUBINUR  NEGATIVE 05/22/2024 1405   KETONESUR NEGATIVE 05/22/2024 1405   PROTEINUR 30 (A) 05/22/2024 1405   UROBILINOGEN 0.2 11/18/2007 1824   NITRITE NEGATIVE 05/22/2024 1405   LEUKOCYTESUR NEGATIVE 05/22/2024 1405    Radiological Exams on Admission: DG Chest 1 View Result Date: 05/22/2024 CLINICAL DATA:  Shortness of breath. EXAM: CHEST  1 VIEW COMPARISON:  Radiograph 12/20/2015. CT 10/06/2013 FINDINGS: Normal heart size with stable mediastinal contours. Aortic atherosclerosis. Right lung base opacity due to prominent epicardial fat pad. Chronic interstitial coarsening. No acute airspace disease. Left chest wall loop recorder. No pulmonary edema. No pleural effusion or pneumothorax. Left axillary surgical clips. On limited assessment, no acute osseous findings. IMPRESSION: Chronic interstitial coarsening. No acute findings. Electronically Signed   By: Andrea Gasman M.D.   On: 05/22/2024 13:56   CT Head Wo Contrast Result Date: 05/22/2024 CLINICAL DATA:  Left leg weakness. EXAM: CT HEAD WITHOUT CONTRAST TECHNIQUE: Contiguous axial images were obtained from the base of the skull through the vertex without intravenous contrast. RADIATION DOSE REDUCTION: This exam was performed according to the departmental dose-optimization program which includes automated exposure control, adjustment of the mA  and/or kV according to patient size and/or use of iterative reconstruction technique. COMPARISON:  Brain MRI earlier today, head CT 04/20/2024 FINDINGS: Brain: No intracranial hemorrhage, mass effect, or midline shift. Stable degree of atrophy ventricular dilatation. Again seen chronic small vessel ischemic change. The basilar cisterns are patent. No evidence of territorial infarct or acute ischemia. Small remote right cerebellar infarct. No extra-axial or intracranial fluid collection. Vascular: Atherosclerosis of skullbase vasculature without hyperdense vessel or abnormal calcification. Skull: No fracture or focal lesion. Sinuses/Orbits: Chronic opacification of a few left ethmoid air cells. Partial opacification of left mastoid air cells. Bilateral lens resection. Other: None. IMPRESSION: 1. No acute intracranial abnormality. 2. Stable atrophy and chronic small vessel ischemic change. Remote right cerebellar lacunar infarct. 3. Stable ventriculomegaly. Electronically Signed   By: Andrea Gasman M.D.   On: 05/22/2024 13:53   MR LUMBAR SPINE WO CONTRAST Result Date: 05/22/2024 EXAM: MRI LUMBAR SPINE 05/22/2024 01:34:00 PM TECHNIQUE: Multiplanar multisequence MRI of the lumbar spine was performed without the administration of intravenous contrast. COMPARISON: MRI of the lumbar spine 08/52. CLINICAL HISTORY: left leg weakness, lower back pain FINDINGS: BONES AND ALIGNMENT: Slight degenerative retrolisthesis at L1-L2 and L2-L3 is stable. Levoconvex curvature is similar to the prior study, centered at L3. Normal vertebral body heights. Bone marrow signal is unremarkable, except for edematous endplate marrow changes at L5-S1 which are new suggesting aggressive loss of disc height. SPINAL CORD: The conus terminates normally. SOFT TISSUES: No paraspinal mass. L1-L2: Mild disc bulging and facet hypertrophy, stable without significant stenosis or change. L2-L3: Mild disc bulging and facet hypertrophy, stable without  significant stenosis or change. L3-L4: Mild disc bulging and facet hypertrophy, stable without focal stenosis. L4-L5: A rightward disc protrusion is present, similar to prior study. Mild facet hypertrophy is present bilaterally. No focal stenosis is present. L5-S1: Chronic loss of disc height is present without focal protrusion or stenosis. Edematous endplate marrow changes at L5-S1 are new suggesting aggressive loss of disc height. IMPRESSION: 1. No acute or focal abnormality to explain leg weakness. 2. Slight progression of multilevel endplate degenerative changes as described. Electronically signed by: Lonni Necessary MD 05/22/2024 01:50 PM EST RP Workstation: [id number]   MR BRAIN WO CONTRAST Result Date: 05/22/2024 EXAM: MRI BRAIN WITHOUT CONTRAST 05/22/2024 01:21:47 PM TECHNIQUE: Multiplanar multisequence MRI of the head/brain was performed without the  administration of intravenous contrast. COMPARISON: Numerous head without and with contrast 11/26/2021. CT head without contrast 04/20/1924. CLINICAL HISTORY: Fall. Nausea and dizziness. Left lower extremity weakness. FINDINGS: BRAIN AND VENTRICLES: Moderate generalized atrophy and white matter changes are similar to the prior study. Ventricles are enlarged. The callosal angle is 120 degrees. 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 No acute infarct. No intracranial hemorrhage. No mass. No midline shift. No hydrocephalus. The sella is unremarkable. Normal flow voids. ORBITS: Bilateral lens replacements are noted. The globes and orbits are otherwise within normal limits. SINUSES AND MASTOIDS: No left mastoid effusion is present. No obstructing nasopharyngeal lesion is present. BONES AND SOFT TISSUES: Normal marrow signal. No acute soft tissue abnormality. IMPRESSION: 1. No acute intracranial abnormality to explain the left leg weakness. 2. 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 3. Moderate  generalized atrophy and white matter changes, similar to the prior study. 4. Stable ventricular enlargement not to the degree of atrophy. Electronically signed by: Lonni Necessary MD 05/22/2024 01:28 PM EST RP Workstation: [id number]   EKG: Independently reviewed. Rate 70s, QTc 485.  Ectopic atrial rhythm.  Assessment/Plan Principal Problem:   Acute hypoxic respiratory failure (HCC) Active Problems:   Influenza A   Fall at home, initial encounter   Paroxysmal atrial fibrillation (HCC)   Chronic anticoagulation   History of pulmonary embolus (PE)   H/O: CVA (cerebrovascular accident)   Urinary incontinence  Assessment and Plan: No notes have been filed under this hospital service. Service: Hospitalist  Acute hypoxic respiratory failure, influenza A infection-O2 sats 88% on room air, placed on 2 L.  Sats now greater than 90%.  Likely due to influenza infection.  Quit smoking cigarettes about 3 years ago.  Chest x-ray shows chronic interstitial coarsening.  She is on Eliquis  and has been compliant. - Tamiflu  - Mucolytics -Held off on steroids at this time as her lungs are clear on auscultation.  - Albuterol  inhaler as needed  Dizziness, fall-onset of dizziness today.  Reports good oral intake, nausea with 1 episode of diarrhea today.  Loss of consciousness.  Did not hit her head.  Ambulates with walker, fell in bathroom on ceramic tiles while using walker. - Obtain orthostatic vitals - 500 mL LR bolus given, continue N/s 75cc/hr x 15hrs  History of CVA, lower extremity weakness-weakness to bilateral lower extremities, MRI of the brain and lumbar spine negative for acute abnormality, remote infarcts lateral posterior inferior right cerebellum.  She has some pain to her left lower extremity which she attributes to the fall. - I offered to get pelvic x-ray, but patient declined. - Reevaluate in a.m. if need to rule out pelvic fracture.  Pulmonary embolism, atrial fibrillation on  chronic anticoagulation.  Heart rate controlled-  68-79. - Resume Eliquis , amiodarone    DVT prophylaxis: Eliquis  Code Status: Full code Family Communication: Spouse at bedside Disposition Plan: ~ 1-2 days Consults called:  None Admission status:  obs ele   Author: Tully FORBES Carwin, MD 05/22/2024 5:36 PM  For on call review [email or web address].     [1]  Allergies Allergen Reactions   Sulfa Antibiotics Other (See Comments)    Childhood Allergy  05/17/18 patient denies   "

## 2024-05-22 NOTE — ED Notes (Cosign Needed Addendum)
 SATURATION QUALIFICATIONS: (This note is used to comply with regulatory documentation for home oxygen)  Patient Saturations on Room Air at Rest = 88%  Patient Saturations on Room Air while Ambulating = UTA d/t pt with significant difficulty ambulating. Pt did have O2 reading of 79% with motion degraded waveform while attempting to walk.   Patient Saturations on 2 Liters of oxygen while Ambulating = UTA d/t pt with significant difficulty ambulating%  Please briefly explain why patient needs home oxygen: Flu A positive. Pt desaturating while on room air

## 2024-05-22 NOTE — Discharge Instructions (Signed)
 Take your tamiflu  as prescribed. Drink lots of fluids and eat a balanced diet. Follow up with your primary care doctor later this week. Call the office to make an appointment. Return to the ER for new or worsening symptoms.

## 2024-05-22 NOTE — ED Notes (Signed)
 Attempted to ambulate pt X 2 staff members with walker Pt was able to take a few steps but was dizzy and light headed Gait was not steady

## 2024-05-22 NOTE — ED Provider Notes (Addendum)
 " [place] EMERGENCY DEPARTMENT AT [facility] Provider Note   CSN: [id number] Arrival date & time: 05/22/24  1049     Patient presents with: Dizziness   [name] is a 78 y.o. female.   78 year old female presents for evaluation of weakness.  She states that started last night.  States it got worse today.  She has been nauseous as well and admits occasional cough.  States she always has back pain but is worse than usual today and she has noticed worsening leg weakness from her baseline especially on the left side.  Denies any fevers or chills or any other symptoms or concerns.   Dizziness Associated symptoms: weakness   Associated symptoms: no chest pain, no palpitations, no shortness of breath and no vomiting        Prior to Admission medications  Medication Sig Start Date End Date Taking? Authorizing Provider  amiodarone  (PACERONE ) 200 MG tablet Take 0.5 tablets (100 mg total) by mouth daily. 06/18/22  Yes Ladona Heinz, MD  apixaban  (ELIQUIS ) 5 MG TABS tablet Take 1 tablet (5 mg total) by mouth 2 (two) times daily. 01/20/23  Yes Ladona Heinz, MD  atorvastatin  (LIPITOR) 20 MG tablet Take 1 tablet (20 mg total) by mouth daily. 11/03/23  Yes Ladona Heinz, MD  oseltamivir  (TAMIFLU ) 75 MG capsule Take 1 capsule (75 mg total) by mouth every 12 (twelve) hours. 05/22/24  Yes Simrin Vegh L, DO  Multiple Vitamin (MULTIVITAMIN WITH MINERALS) TABS tablet Take 1 tablet by mouth daily.    [provider]  Multiple Vitamins-Minerals (PRESERVISION AREDS 2) CAPS Take by mouth 2 (two) times daily.     [provider]  Omega-3 Fatty Acids (FISH OIL) 1000 MG CAPS Take by mouth daily.    [provider]    Allergies: Sulfa antibiotics    Review of Systems  Constitutional:  Positive for fatigue. Negative for chills and fever.  HENT:  Negative for ear pain and sore throat.   Eyes:  Negative for pain and visual disturbance.  Respiratory:  Positive for cough.  Negative for shortness of breath.   Cardiovascular:  Negative for chest pain and palpitations.  Gastrointestinal:  Negative for abdominal pain and vomiting.  Genitourinary:  Negative for dysuria and hematuria.  Musculoskeletal:  Positive for back pain. Negative for arthralgias.  Skin:  Negative for color change and rash.  Neurological:  Positive for dizziness and weakness. Negative for seizures and syncope.  All other systems reviewed and are negative.   Updated Vital Signs BP (!) 136/58   Pulse 71   Temp 98.2 F (36.8 C) (Oral)   Resp 16   Ht 5' 8 (1.727 m)   Wt 78.9 kg   SpO2 91%   BMI 26.46 kg/m   Physical Exam Vitals and nursing note reviewed.  Constitutional:      General: She is not in acute distress.    Appearance: Normal appearance. She is well-developed. She is ill-appearing.  HENT:     Head: Normocephalic and atraumatic.  Eyes:     Conjunctiva/sclera: Conjunctivae normal.  Cardiovascular:     Rate and Rhythm: Normal rate and regular rhythm.     Heart sounds: No murmur heard. Pulmonary:     Effort: Pulmonary effort is normal. No respiratory distress.     Breath sounds: Normal breath sounds. No stridor. No wheezing or rhonchi.  Abdominal:     Palpations: Abdomen is soft.     Tenderness: There is no abdominal tenderness.  Musculoskeletal:        General: No swelling.     Cervical back: Neck supple.  Skin:    General: Skin is warm and dry.     Capillary Refill: Capillary refill takes less than 2 seconds.  Neurological:     Mental Status: She is alert.     Comments: Left leg weakness, 0/5 in flexion compared to right which is 2/5 in flexion  Psychiatric:        Mood and Affect: Mood normal.     (all labs ordered are listed, but only abnormal results are displayed) Labs Reviewed  RESP PANEL BY RT-PCR (RSV, FLU A&B, COVID)  RVPGX2 - Abnormal; Notable for the following components:      Result Value   Influenza A by PCR POSITIVE (*)    All other components  within normal limits  BASIC METABOLIC PANEL WITH GFR - Abnormal; Notable for the following components:   Glucose, Bld 106 (*)    All other components within normal limits  CBC WITH DIFFERENTIAL/PLATELET - Abnormal; Notable for the following components:   Lymphs Abs 0.6 (*)    All other components within normal limits  HEPATIC FUNCTION PANEL - Abnormal; Notable for the following components:   Indirect Bilirubin 0.2 (*)    All other components within normal limits  URINALYSIS, W/ REFLEX TO CULTURE (INFECTION SUSPECTED) - Abnormal; Notable for the following components:   Color, Urine AMBER (*)    APPearance TURBID (*)    Protein, ur 30 (*)    Bacteria, UA FEW (*)    All other components within normal limits  TROPONIN T, HIGH SENSITIVITY - Abnormal; Notable for the following components:   Troponin T High Sensitivity 63 (*)    All other components within normal limits  TROPONIN T, HIGH SENSITIVITY - Abnormal; Notable for the following components:   Troponin T High Sensitivity 59 (*)    All other components within normal limits  LIPASE, BLOOD    EKG: EKG Interpretation Date/Time:  Sunday May 22 2024 11:08:59 EST Ventricular Rate:  76 PR Interval:  184 QRS Duration:  125 QT Interval:  404 QTC Calculation: 455 R Axis:   -40  Text Interpretation: 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 EXAM: CHEST  1 VIEW COMPARISON:  Radiograph 12/20/2015. CT 10/06/2013 FINDINGS: Normal heart size with stable mediastinal contours. Aortic atherosclerosis. Right lung base opacity due to prominent epicardial fat pad. Chronic interstitial coarsening. No acute airspace disease. Left chest wall loop recorder. No pulmonary edema. No pleural effusion or pneumothorax. Left axillary surgical clips. On limited  assessment, no acute osseous findings. IMPRESSION: Chronic interstitial coarsening. No acute findings. Electronically Signed   By: Andrea Gasman M.D.   On: 05/22/2024 13:56   CT Head Wo Contrast Result Date: 05/22/2024 CLINICAL DATA:  Left leg weakness. EXAM: CT HEAD WITHOUT CONTRAST TECHNIQUE: Contiguous axial images were obtained from the base of the skull through the vertex without intravenous contrast. RADIATION DOSE REDUCTION: This exam was performed according to the departmental dose-optimization program which includes automated exposure control, adjustment of the mA and/or kV according to patient size and/or use of iterative reconstruction technique. COMPARISON:  Brain MRI earlier today, head CT 04/20/2024 FINDINGS: Brain: No intracranial hemorrhage, mass effect, or midline shift. Stable degree of atrophy ventricular dilatation. Again seen  chronic small vessel ischemic change. The basilar cisterns are patent. No evidence of territorial infarct or acute ischemia. Small remote right cerebellar infarct. No extra-axial or intracranial fluid collection. Vascular: Atherosclerosis of skullbase vasculature without hyperdense vessel or abnormal calcification. Skull: No fracture or focal lesion. Sinuses/Orbits: Chronic opacification of a few left ethmoid air cells. Partial opacification of left mastoid air cells. Bilateral lens resection. Other: None. IMPRESSION: 1. No acute intracranial abnormality. 2. Stable atrophy and chronic small vessel ischemic change. Remote right cerebellar lacunar infarct. 3. Stable ventriculomegaly. Electronically Signed   By: Andrea Gasman M.D.   On: 05/22/2024 13:53   MR LUMBAR SPINE WO CONTRAST Result Date: 05/22/2024 EXAM: MRI LUMBAR SPINE 05/22/2024 01:34:00 PM TECHNIQUE: Multiplanar multisequence MRI of the lumbar spine was performed without the administration of intravenous contrast. COMPARISON: MRI of the lumbar spine 08/52. CLINICAL HISTORY: left leg weakness, lower  back pain FINDINGS: BONES AND ALIGNMENT: Slight degenerative retrolisthesis at L1-L2 and L2-L3 is stable. Levoconvex curvature is similar to the prior study, centered at L3. Normal vertebral body heights. Bone marrow signal is unremarkable, except for edematous endplate marrow changes at L5-S1 which are new suggesting aggressive loss of disc height. SPINAL CORD: The conus terminates normally. SOFT TISSUES: No paraspinal mass. L1-L2: Mild disc bulging and facet hypertrophy, stable without significant stenosis or change. L2-L3: Mild disc bulging and facet hypertrophy, stable without significant stenosis or change. L3-L4: Mild disc bulging and facet hypertrophy, stable without focal stenosis. L4-L5: A rightward disc protrusion is present, similar to prior study. Mild facet hypertrophy is present bilaterally. No focal stenosis is present. L5-S1: Chronic loss of disc height is present without focal protrusion or stenosis. Edematous endplate marrow changes at L5-S1 are new suggesting aggressive loss of disc height. IMPRESSION: 1. No acute or focal abnormality to explain leg weakness. 2. Slight progression of multilevel endplate degenerative changes as described. Electronically signed by: Lonni Necessary MD 05/22/2024 01:50 PM EST RP Workstation: [id number]   MR BRAIN WO CONTRAST Result Date: 05/22/2024 EXAM: MRI BRAIN WITHOUT CONTRAST 05/22/2024 01:21:47 PM TECHNIQUE: Multiplanar multisequence MRI of the head/brain was performed without the administration of intravenous contrast. COMPARISON: Numerous head without and with contrast 11/26/2021. CT head without contrast 04/20/1924. CLINICAL HISTORY: Fall. Nausea and dizziness. Left lower extremity weakness. FINDINGS: BRAIN AND VENTRICLES: Moderate generalized atrophy and white matter changes are similar to the prior study. Ventricles are enlarged. The callosal angle is 120 degrees. 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 No  acute infarct. No intracranial hemorrhage. No mass. No midline shift. No hydrocephalus. The sella is unremarkable. Normal flow voids. ORBITS: Bilateral lens replacements are noted. The globes and orbits are otherwise within normal limits. SINUSES AND MASTOIDS: No left mastoid effusion is present. No obstructing nasopharyngeal lesion is present. BONES AND SOFT TISSUES: Normal marrow signal. No acute soft tissue abnormality. IMPRESSION: 1. No acute intracranial abnormality to explain the left leg weakness. 2. 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 3. Moderate generalized atrophy and white matter changes, similar to the prior study. 4. Stable ventricular enlargement not to the degree of atrophy. Electronically signed by: Lonni Necessary MD 05/22/2024 01:28 PM EST RP Workstation: [id number]     .Critical Care  Performed by: Gennaro Duwaine CROME, DO Authorized by: Gennaro Duwaine CROME, DO   Critical care provider statement:    Critical care time (minutes):  30   Critical care time was exclusive of:  Separately billable procedures and treating other patients and  teaching time   Critical care was necessary to treat or prevent imminent or life-threatening deterioration of the following conditions:  Respiratory failure   Critical care was time spent personally by me on the following activities:  Development of treatment plan with patient or surrogate, discussions with consultants, evaluation of patient's response to treatment, examination of patient, ordering and review of laboratory studies, ordering and review of radiographic studies, ordering and performing treatments and interventions, pulse oximetry, re-evaluation of patient's condition and review of old charts   Care discussed with: admitting provider      Medications Ordered in the ED  lactated ringers  bolus 500 mL (0 mLs Intravenous Stopped 05/22/24 1432)  ondansetron  (ZOFRAN ) injection 4 mg (4 mg Intravenous Given 05/22/24  1239)                                    Medical Decision Making Cardiac monitor interpretation: Sinus rhythm, no ectopy  Patient here for weakness and found to be positive for flu A.  Was hypoxic as well and placed on 2 L nasal cannula.  Discussed admission with hospitalist and they recommended possible outpatient O2.  We will have case worker evaluate patient for this and attempt to get her home O2.  Her first troponin is also positive but EKG is okay and she has no chest pain.  Imaging unremarkable.  Second troponin is flat.  Despite being able to get her O2 in the emergency department she is unable to ambulate with a walker at all even to the edge of the bed.  Patient's case discussed with hospitalist patient admitted for further workup and management.  Patient and family bedside are agreeable to plan.  Problems Addressed: Acute hypoxic respiratory failure (HCC): acute illness or injury that poses a threat to life or bodily functions Chronic back pain, unspecified back location, unspecified back pain laterality: chronic illness or injury Elevated troponin: undiagnosed new problem with uncertain prognosis Influenza A: acute illness or injury Weakness: acute illness or injury  Amount and/or Complexity of Data Reviewed External Data Reviewed: notes.    Details: No prior ER records for review Labs: ordered. Decision-making details documented in ED Course.    Details: Ordered and reviewed by me and first troponin is slightly elevated patient is positive for flu A.  Second troponin pending Radiology: ordered and independent interpretation performed. Decision-making details documented in ED Course.    Details: Ordered and interpreted by me independently of radiology MRI of the brain: Shows no acute abnormality CT head: Shows no acute abnormality MRI L-spine: Shows chronic changes but no acute abnormality Chest x-ray shows some atelectasis but no pneumonia ECG/medicine tests: ordered and  independent interpretation performed. Decision-making details documented in ED Course.    Details: Ordered and interpreted by me in the absence of cardiology and shows sinus rhythm, no STEMI, or significant change when compared to prior EKG Discussion of management or test interpretation with external provider(s): Dr. Ricky - hospitalist - we discussed the patient's case and he recommended eval for home o2 and tamiflu , and potential d/c from ER if social work is able to arrange home o2    Risk OTC drugs. Prescription drug management. Drug therapy requiring intensive monitoring for toxicity. Decision regarding hospitalization. Risk Details: CRITICAL CARE Performed by: [name] Fusi   Total critical care time: 30 minutes  Critical care time was exclusive of separately billable procedures and treating other patients.  Critical care was necessary to treat or prevent imminent or life-threatening deterioration.  Critical care was time spent personally by me on the following activities: development of treatment plan with patient and/or surrogate as well as nursing, discussions with consultants, evaluation of patient's response to treatment, examination of patient, obtaining history from patient or surrogate, ordering and performing treatments and interventions, ordering and review of laboratory studies, ordering and review of radiographic studies, pulse oximetry and re-evaluation of patient's condition.   Critical Care Total time providing critical care: 30 minutes     Final diagnoses:  Acute hypoxic respiratory failure (HCC)  Influenza A  Elevated troponin  Chronic back pain, unspecified back location, unspecified back pain laterality  Weakness    ED Discharge Orders          Ordered    oseltamivir  (TAMIFLU ) 75 MG capsule  Every 12 hours        05/22/24 1509               Gennaro Bouchard L, DO 05/22/24 1457    Clytie Shetley L, DO 05/22/24 1607  "

## 2024-05-23 DIAGNOSIS — J9601 Acute respiratory failure with hypoxia: Secondary | ICD-10-CM | POA: Diagnosis not present

## 2024-05-23 MED ORDER — ACETAMINOPHEN 500 MG PO TABS: 500.0000 mg | ORAL_TABLET | Freq: Four times a day (QID) | ORAL | Status: AC | PRN

## 2024-05-23 MED ORDER — SACCHAROMYCES BOULARDII 250 MG PO CAPS
250.0000 mg | ORAL_CAPSULE | Freq: Two times a day (BID) | ORAL | Status: DC
  Administered 2024-05-23: 250 mg via ORAL
  Filled 2024-05-23: qty 1

## 2024-05-23 MED ORDER — ALBUTEROL SULFATE HFA 108 (90 BASE) MCG/ACT IN AERS: 1.0000 | INHALATION_SPRAY | RESPIRATORY_TRACT | 0 refills | Status: DC | PRN

## 2024-05-23 MED ORDER — SACCHAROMYCES BOULARDII 250 MG PO CAPS: 250.0000 mg | ORAL_CAPSULE | Freq: Two times a day (BID) | ORAL | 0 refills | Status: DC

## 2024-05-23 MED ORDER — OSELTAMIVIR PHOSPHATE 75 MG PO CAPS: 75.0000 mg | ORAL_CAPSULE | Freq: Two times a day (BID) | ORAL | 0 refills | Status: AC

## 2024-05-23 NOTE — Plan of Care (Signed)

## 2024-05-23 NOTE — Care Management Obs Status (Signed)
 MEDICARE OBSERVATION STATUS NOTIFICATION   Patient Details  Name: [name] MRN: [id number] Date of Birth: Apr 06, 1946   Medicare Observation Status Notification Given:  Yes    [name] Ada 05/23/2024, 4:39 PM

## 2024-05-23 NOTE — Progress Notes (Signed)
 [name] has discharge paperwork in this has been given to patient awaiting husband to come to review it. IV has been removed. There was visitors in the room so Telemetry is still on the pt due to privacy of the patient.

## 2024-05-23 NOTE — Discharge Summary (Signed)
 " Physician Discharge Summary   Patient: [name] MRN: [id number] DOB: [birth year]  Admit date:     05/22/2024  Discharge date: 05/23/2024  Discharge Physician: [name]   PCP: Diana Vaughn FALCON, MD   Recommendations at discharge:  Repeat basic metabolic panel to follow electrolytes and renal function Repeat CBC to follow hemoglobin trend/stability.  Discharge Diagnoses: Principal Problem:   Acute hypoxic respiratory failure (HCC) Active Problems:   Influenza A   Fall at home, initial encounter   Paroxysmal atrial fibrillation (HCC)   Chronic anticoagulation   History of pulmonary embolus (PE)   H/O: CVA (cerebrovascular accident)   Urinary incontinence  Brief Hospital admission narrative: As per H&P written by Dr. Pearlean on 05/22/2024 [name] is a 78 y.o. female with medical history significant for atrial fibrillation on chronic anticoagulation, CVA, pulmonary embolism. Patient presented to ED with complaints of nausea, dizziness, and a fall this morning in her bathroom on ceramic floor.  She is not sure why she slipped and fell, but she fell with a walker she was using and onto the commode.  She reports dizziness this morning, with cough.  She also feels her lower extremities are weaker than baseline. She denies difficulty breathing, no chest pain, no congestion.  Smoked cigarettes until 3 years ago when she quit.   ED Course: Temperature 97.7.  Heart rate 68-79.  Respiratory rate 15-20.  Blood pressure systolic 130 -136.  O2 sats down to 88% on room air, placed on 2 L sats 91%. Chest x-ray chronic interstitial coarsening. MRI brain/CT head shows remote right cerebellar infarct. MRI lumbar spine-no acute abnormality. 500 mL bolus given. Plan was for home O2 to be arranged by [facility] so patient can be discharged from the ED, but patient was very weak in the ED, with dizziness, unable to ambulate in the ED hence hospitalization requested.  Assessment and  Plan: 1-influenza A - Patient with mild hypoxia at time of admission - Condition overall stabilized and patient Oxygen saturation above 90% without supplementation. - Will go home on Tamiflu , as needed Tylenol  for comfort and symptomatic management and the use of as needed bronchodilators. -Patient advised to maintain adequate hydration.  2-diarrhea/dehydration - Most likely associated with influenza infection - Tolerating diet without any problem - Continue the use of Florastor - Patient advised to maintain adequate hydration - Repeat basic metabolic panel follow-up visit to assess electrolytes and renal function and stability.  3-paroxysmal atrial fibrillation/history of pulmonary embolism - No chest pain or complaints of palpitations. - Continue the use of amiodarone  100 mg daily - Continue the use of Eliquis  for secondary prevention.  4-history of CVA (right overall cerebellum infarct) - Home health services has been arranged at discharge - Home health PT/OT has been arranged for transition of care and conditioning. - Continue risk factor modification.  5-hyperlipidemia - Continue statin.   Consultants: None Procedures performed: See below for x-ray report. Disposition: Home health Diet recommendation: heart healthy diet  DISCHARGE MEDICATION: Allergies as of 05/23/2024   No Active Allergies      Medication List     TAKE these medications    acetaminophen  500 MG tablet Commonly known as: TYLENOL  Take 1 tablet (500 mg total) by mouth every 6 (six) hours as needed for mild pain (pain score 1-3), fever or headache.   albuterol  108 (90 Base) MCG/ACT inhaler Commonly known as: VENTOLIN  HFA Inhale 1 puff into the lungs every 4 (four) hours as needed for wheezing or  shortness of breath.   amiodarone  100 MG tablet Commonly known as: PACERONE  Take 100 mg by mouth daily. What changed: Another medication with the same name was removed. Continue taking this medication,  and follow the directions you see here.   apixaban  5 MG Tabs tablet Commonly known as: ELIQUIS  Take 1 tablet (5 mg total) by mouth 2 (two) times daily.   atorvastatin  20 MG tablet Commonly known as: LIPITOR Take 1 tablet (20 mg total) by mouth daily.   Fish Oil 1000 MG Caps Take by mouth daily.   multivitamin with minerals Tabs tablet Take 1 tablet by mouth daily.   oseltamivir  75 MG capsule Commonly known as: TAMIFLU  Take 1 capsule (75 mg total) by mouth every 12 (twelve) hours for 5 days.   PreserVision AREDS 2 Caps Take by mouth 2 (two) times daily.   saccharomyces boulardii 250 MG capsule Commonly known as: FLORASTOR Take 1 capsule (250 mg total) by mouth 2 (two) times daily.        Contact information for follow-up providers     Diana Vaughn FALCON, MD. Schedule an appointment as soon as possible for a visit in 10 day(s).   Specialty: Internal Medicine Contact information: [address] [place] [address] [phone]              Contact information for after-discharge care     Home Medical Care     [facility]) .   Service: Home Health Services Contact information: [address] [place]  [address] [phone]                    Discharge Exam: [name]   05/22/24 1102  Weight: 78.9 kg   General exam: Alert, awake, oriented x 3; speaking in full sentences; no acute distress and not requiring oxygen supplementation.  Patient is afebrile. Respiratory system: No wheezing, no crackles, positive scattered rhonchi. Cardiovascular system: Rate controlled, no rubs, no gallops, no JVD. Gastrointestinal system: Abdomen is nondistended, soft and nontender. No organomegaly or masses felt. Normal bowel sounds heard. Central nervous system: Generally weak.  No focal neurological deficits. Extremities: No cyanosis or clubbing. Skin: No petechiae. Psychiatry: Judgement and insight appear  normal. Mood & affect appropriate.    Condition at discharge: stable  The results of significant diagnostics from this hospitalization (including imaging, microbiology, ancillary and laboratory) are listed below for reference.   Imaging Studies: DG Chest 1 View Result Date: 05/22/2024 CLINICAL DATA:  Shortness of breath. EXAM: CHEST  1 VIEW COMPARISON:  Radiograph 12/20/2015. CT 10/06/2013 FINDINGS: Normal heart size with stable mediastinal contours. Aortic atherosclerosis. Right lung base opacity due to prominent epicardial fat pad. Chronic interstitial coarsening. No acute airspace disease. Left chest wall loop recorder. No pulmonary edema. No pleural effusion or pneumothorax. Left axillary surgical clips. On limited assessment, no acute osseous findings. IMPRESSION: Chronic interstitial coarsening. No acute findings. Electronically Signed   By: Andrea Gasman M.D.   On: 05/22/2024 13:56   CT Head Wo Contrast Result Date: 05/22/2024 CLINICAL DATA:  Left leg weakness. EXAM: CT HEAD WITHOUT CONTRAST TECHNIQUE: Contiguous axial images were obtained from the base of the skull through the vertex without intravenous contrast. RADIATION DOSE REDUCTION: This exam was performed according to the departmental dose-optimization program which includes automated exposure control, adjustment of the mA and/or kV according to patient size and/or use of iterative reconstruction technique. COMPARISON:  Brain MRI earlier today, head CT 04/20/2024 FINDINGS:  Brain: No intracranial hemorrhage, mass effect, or midline shift. Stable degree of atrophy ventricular dilatation. Again seen chronic small vessel ischemic change. The basilar cisterns are patent. No evidence of territorial infarct or acute ischemia. Small remote right cerebellar infarct. No extra-axial or intracranial fluid collection. Vascular: Atherosclerosis of skullbase vasculature without hyperdense vessel or abnormal calcification. Skull: No fracture or focal  lesion. Sinuses/Orbits: Chronic opacification of a few left ethmoid air cells. Partial opacification of left mastoid air cells. Bilateral lens resection. Other: None. IMPRESSION: 1. No acute intracranial abnormality. 2. Stable atrophy and chronic small vessel ischemic change. Remote right cerebellar lacunar infarct. 3. Stable ventriculomegaly. Electronically Signed   By: Andrea Gasman M.D.   On: 05/22/2024 13:53   MR LUMBAR SPINE WO CONTRAST Result Date: 05/22/2024 EXAM: MRI LUMBAR SPINE 05/22/2024 01:34:00 PM TECHNIQUE: Multiplanar multisequence MRI of the lumbar spine was performed without the administration of intravenous contrast. COMPARISON: MRI of the lumbar spine 08/52. CLINICAL HISTORY: left leg weakness, lower back pain FINDINGS: BONES AND ALIGNMENT: Slight degenerative retrolisthesis at L1-L2 and L2-L3 is stable. Levoconvex curvature is similar to the prior study, centered at L3. Normal vertebral body heights. Bone marrow signal is unremarkable, except for edematous endplate marrow changes at L5-S1 which are new suggesting aggressive loss of disc height. SPINAL CORD: The conus terminates normally. SOFT TISSUES: No paraspinal mass. L1-L2: Mild disc bulging and facet hypertrophy, stable without significant stenosis or change. L2-L3: Mild disc bulging and facet hypertrophy, stable without significant stenosis or change. L3-L4: Mild disc bulging and facet hypertrophy, stable without focal stenosis. L4-L5: A rightward disc protrusion is present, similar to prior study. Mild facet hypertrophy is present bilaterally. No focal stenosis is present. L5-S1: Chronic loss of disc height is present without focal protrusion or stenosis. Edematous endplate marrow changes at L5-S1 are new suggesting aggressive loss of disc height. IMPRESSION: 1. No acute or focal abnormality to explain leg weakness. 2. Slight progression of multilevel endplate degenerative changes as described. Electronically signed by: Lonni Necessary MD 05/22/2024 01:50 PM EST RP Workstation: [id number]   MR BRAIN WO CONTRAST Result Date: 05/22/2024 EXAM: MRI BRAIN WITHOUT CONTRAST 05/22/2024 01:21:47 PM TECHNIQUE: Multiplanar multisequence MRI of the head/brain was performed without the administration of intravenous contrast. COMPARISON: Numerous head without and with contrast 11/26/2021. CT head without contrast 04/20/1924. CLINICAL HISTORY: Fall. Nausea and dizziness. Left lower extremity weakness. FINDINGS: BRAIN AND VENTRICLES: Moderate generalized atrophy and white matter changes are similar to the prior study. Ventricles are enlarged. The callosal angle is 120 degrees. 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 No acute infarct. No intracranial hemorrhage. No mass. No midline shift. No hydrocephalus. The sella is unremarkable. Normal flow voids. ORBITS: Bilateral lens replacements are noted. The globes and orbits are otherwise within normal limits. SINUSES AND MASTOIDS: No left mastoid effusion is present. No obstructing nasopharyngeal lesion is present. BONES AND SOFT TISSUES: Normal marrow signal. No acute soft tissue abnormality. IMPRESSION: 1. No acute intracranial abnormality to explain the left leg weakness. 2. 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 3. Moderate generalized atrophy and white matter changes, similar to the prior study. 4. Stable ventricular enlargement not to the degree of atrophy. Electronically signed by: Lonni Necessary MD 05/22/2024 01:28 PM EST RP Workstation: [id number]    Microbiology: Results for orders placed or performed during the hospital encounter of 05/22/24  Resp panel by RT-PCR (RSV, Flu A&B, Covid) Anterior Nasal Swab     Status: Abnormal  Collection Time: 05/22/24 12:25 PM   Specimen: Anterior Nasal Swab  Result Value Ref Range Status   SARS Coronavirus 2 by RT PCR NEGATIVE NEGATIVE Final    Comment: (NOTE) SARS-CoV-2 target  nucleic acids are NOT DETECTED.  The SARS-CoV-2 RNA is generally detectable in upper respiratory specimens during the acute phase of infection. The lowest concentration of SARS-CoV-2 viral copies this assay can detect is 138 copies/mL. A negative result does not preclude SARS-Cov-2 infection and should not be used as the sole basis for treatment or other patient management decisions. A negative result may occur with  improper specimen collection/handling, submission of specimen other than nasopharyngeal swab, presence of viral mutation(s) within the areas targeted by this assay, and inadequate number of viral copies(<138 copies/mL). A negative result must be combined with clinical observations, patient history, and epidemiological information. The expected result is Negative.  Fact Sheet for Patients:  bloggercourse.com  Fact Sheet for Healthcare Providers:  seriousbroker.it  This test is no t yet approved or cleared by the [place]  FDA and  has been authorized for detection and/or diagnosis of SARS-CoV-2 by FDA under an Emergency Use Authorization (EUA). This EUA will remain  in effect (meaning this test can be used) for the duration of the COVID-19 declaration under Section 564(b)(1) of the Act, 21 U.S.C.section 360bbb-3(b)(1), unless the authorization is terminated  or revoked sooner.       Influenza A by PCR POSITIVE (A) NEGATIVE Final   Influenza B by PCR NEGATIVE NEGATIVE Final    Comment: (NOTE) The Xpert Xpress SARS-CoV-2/FLU/RSV plus assay is intended as an aid in the diagnosis of influenza from Nasopharyngeal swab specimens and should not be used as a sole basis for treatment. Nasal washings and aspirates are unacceptable for Xpert Xpress SARS-CoV-2/FLU/RSV testing.  Fact Sheet for Patients: bloggercourse.com  Fact Sheet for Healthcare  Providers: seriousbroker.it  This test is not yet approved or cleared by the [place]  FDA and has been authorized for detection and/or diagnosis of SARS-CoV-2 by FDA under an Emergency Use Authorization (EUA). This EUA will remain in effect (meaning this test can be used) for the duration of the COVID-19 declaration under Section 564(b)(1) of the Act, 21 U.S.C. section 360bbb-3(b)(1), unless the authorization is terminated or revoked.     Resp Syncytial Virus by PCR NEGATIVE NEGATIVE Final    Comment: (NOTE) Fact Sheet for Patients: bloggercourse.com  Fact Sheet for Healthcare Providers: seriousbroker.it  This test is not yet approved or cleared by the [place]  FDA and has been authorized for detection and/or diagnosis of SARS-CoV-2 by FDA under an Emergency Use Authorization (EUA). This EUA will remain in effect (meaning this test can be used) for the duration of the COVID-19 declaration under Section 564(b)(1) of the Act, 21 U.S.C. section 360bbb-3(b)(1), unless the authorization is terminated or revoked.  Performed at [facility], [address] [place], [place] [address]     Labs: CBC: Recent Labs  Lab 05/22/24 1240  WBC 6.6  NEUTROABS 5.2  HGB 12.8  HCT 38.9  MCV 94.2  PLT 253   Basic Metabolic Panel: Recent Labs  Lab 05/22/24 1240  NA 136  K 3.8  CL 100  CO2 22  GLUCOSE 106*  BUN 15  CREATININE 0.60  CALCIUM  9.0   Liver Function Tests: Recent Labs  Lab 05/22/24 1240  AST 21  ALT 12  ALKPHOS 100  BILITOT 0.3  PROT 6.8  ALBUMIN 4.0   CBG: No results for input(s): GLUCAP  in the last 168 hours.  Discharge time spent:  35 minutes.  Signed: Eric Nunnery, MD [facility] 05/23/2024 "

## 2024-05-23 NOTE — TOC Transition Note (Signed)
 Transition of Care [facility]) - Discharge Note   Patient Details  Name: [name] MRN: [id number] Date of Birth: 1945-11-21  [facility] of [facility]) CM/SW Contact:  Mcarthur Saddie Kim, LCSW Phone Number: 05/23/2024, 3:03 PM   Clinical Narrative:  Anticipate d/c later today. Cory with [facility] notified. MD aware needs HHPT/OT orders.      Final next level of care: Home w Home Health Services Barriers to Discharge: Continued Medical Work up   Patient Goals and CMS Choice Patient states their goals for this hospitalization and ongoing recovery are:: return home   Choice offered to / list presented to : Patient [place] ownership interest in [facility].provided to::  (n/a)    Discharge Placement                       Discharge Plan and Services Additional resources added to the After Visit Summary for   In-house Referral: Clinical Social Work   Post Acute Care Choice: Home Health                    HH Arranged: PT, OT [facility] Agency: [facility] Care Date [facility] Agency Contacted: 05/23/24 Time HH Agency Contacted: 1021 Representative spoke with at [facility] Agency: Darleene  Social Drivers of Health (SDOH) Interventions SDOH Screenings   Food Insecurity: No Food Insecurity (05/22/2024)  Housing: Low Risk (05/22/2024)  Transportation Needs: No Transportation Needs (05/22/2024)  Utilities: Not At Risk (05/22/2024)  Social Connections: Socially Isolated (05/22/2024)  Tobacco Use: High Risk (05/22/2024)     Readmission Risk Interventions     No data to display

## 2024-05-23 NOTE — TOC Initial Note (Signed)
 Transition of Care [facility]) - Initial/Assessment Note    Patient Details  Name: [name] MRN: [id number] Date of Birth: 1945-09-23  Transition of Care [facility]) CM/SW Contact:    Mcarthur Saddie Kim, LCSW Phone Number: 05/23/2024, 10:22 AM  Clinical Narrative: Pt admitted due to acute hypoxic respiratory failure. Assessment completed with pt. She reports she lives with her husband and is fairly independent with ADLs. Pt ambulates with a walker at baseline. PT evaluated pt and recommend SNF. LCSW discussed with pt who refuses SNF at this time. Pt states, I have been to rehab before and it was a waste of my time. Pt is agreeable to HHPT/OT. No preferred on agency. Referred and accepted by [facility] with [facility]. Will need HHPT/OT orders. TOC will follow.                   Expected Discharge Plan: Home w Home Health Services Barriers to Discharge: Continued Medical Work up   Patient Goals and CMS Choice Patient states their goals for this hospitalization and ongoing recovery are:: return home   Choice offered to / list presented to : Patient [place] ownership interest in [facility].provided to::  (n/a)    Expected Discharge Plan and Services In-house Referral: Clinical Social Work   Post Acute Care Choice: Home Health Living arrangements for the past 2 months: Single Family Home                           HH Arranged: PT, OT HH Agency: [facility] Health Care Date [facility] Agency Contacted: 05/23/24 Time HH Agency Contacted: 1021 Representative spoke with at [facility] Agency: Darleene  Prior Living Arrangements/Services Living arrangements for the past 2 months: Single Family Home Lives with:: Spouse Patient language and need for interpreter reviewed:: Yes Do you feel safe going back to the place where you live?: Yes      Need for Family Participation in Patient Care: No (Comment)   Current home services: DME (walker) Criminal Activity/Legal Involvement Pertinent to Current  Situation/Hospitalization: No - Comment as needed  Activities of Daily Living   ADL Screening (condition at time of admission) Independently performs ADLs?: Yes (appropriate for developmental age) Is the patient deaf or have difficulty hearing?: No Does the patient have difficulty seeing, even when wearing glasses/contacts?: No Does the patient have difficulty concentrating, remembering, or making decisions?: No  Permission Sought/Granted                  Emotional Assessment     Affect (typically observed): Appropriate Orientation: : Oriented to Self, Oriented to  Time, Oriented to Situation, Oriented to Place Alcohol  / Substance Use: Not Applicable Psych Involvement: No (comment)  Admission diagnosis:  Weakness [R53.1] Influenza A [J10.1] Elevated troponin [R79.89] Chronic back pain, unspecified back location, unspecified back pain laterality [M54.9, G89.29] Acute hypoxic respiratory failure (HCC) [J96.01] Patient Active Problem List   Diagnosis Date Noted   Acute hypoxic respiratory failure (HCC) 05/22/2024   Influenza A 05/22/2024   Closed subcapital fracture of left femur (HCC) 04/23/2022   Left displaced femoral neck fracture (HCC) 04/22/2022   Fall at home, initial encounter 04/22/2022   History of pulmonary embolus (PE) 04/22/2022   Paroxysmal atrial fibrillation (HCC) 04/22/2022   Chronic anticoagulation 04/22/2022   NPH (normal pressure hydrocephalus) (HCC) 11/07/2021   Abnormality of gait due to impairment of balance 11/07/2021   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 06/15/2021   Asymptomatic  bilateral carotid artery stenosis 05/17/2021   Ectopic atrial rhythm 05/17/2021   Syncope and collapse 05/17/2021   H/O: CVA (cerebrovascular accident) 11/01/2019   Cognitive deficits 09/26/2019   Gait instability 09/26/2019   Bleeding from varicose vein 01/05/2019   Dizziness 01/03/2018   DDD (degenerative disc disease), lumbar 08/31/2017   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 [place] [facility] - [place], [place] - [address] [place] [address] Phone: [phone] Fax: [phone]  [facility] [place], [place] - [address] [place] [address] Phone: [phone] Fax: [phone]     Social Drivers of Health (SDOH) Social History: SDOH Screenings   Food Insecurity: No Food Insecurity (05/22/2024)  Housing: Low Risk (05/22/2024)  Transportation Needs: No Transportation Needs (05/22/2024)  Utilities: Not At Risk (05/22/2024)  Social Connections: Socially Isolated (05/22/2024)  Tobacco Use: High Risk (05/22/2024)   SDOH Interventions:     Readmission Risk Interventions     No data to display

## 2024-05-23 NOTE — Evaluation (Signed)
 Physical Therapy Evaluation Patient Details Name: [name] MRN: [id number] DOB: [birth year] Today's Date: 05/23/2024  History of Present Illness  [name] is a 78 y.o. female with medical history significant for atrial fibrillation on chronic anticoagulation, CVA, pulmonary embolism.  Patient presented to ED with complaints of nausea, dizziness, and a fall this morning in her bathroom on ceramic floor.  She is not sure why she slipped and fell, but she fell with a walker she was using and onto the commode.  She reports dizziness this morning, with cough.  She also feels her lower extremities are weaker than baseline.  She denies difficulty breathing, no chest pain, no congestion.  Smoked cigarettes until 3 years ago when she quit.   Clinical Impression  Patient demonstrates slow labored movement for sitting up at bedside with difficulty pulling self to sitting due to weakness, very unsteady on feet and limited to a few steps at bedside with near loss of balance due to BLE weakness. Patient tolerated sitting up in chair after therapy. Patient will benefit from continued skilled physical therapy in hospital and recommended venue below to increase strength, balance, endurance for safe ADLs and gait.           If plan is discharge home, recommend the following: A lot of help with bathing/dressing/bathroom;A lot of help with walking and/or transfers;Help with stairs or ramp for entrance;Assistance with cooking/housework;Assist for transportation   Can travel by private vehicle   No    Equipment Recommendations None recommended by PT  Recommendations for Other Services       Functional Status Assessment Patient has had a recent decline in their functional status and demonstrates the ability to make significant improvements in function in a reasonable and predictable amount of time.     Precautions / Restrictions Precautions Precautions: Fall Recall of Precautions/Restrictions:  Intact Restrictions Weight Bearing Restrictions Per Provider Order: No      Mobility  Bed Mobility Overal bed mobility: Needs Assistance Bed Mobility: Supine to Sit     Supine to sit: Mod assist, Used rails     General bed mobility comments: increased time, labored movement, had to use bed rail    Transfers Overall transfer level: Needs assistance Equipment used: Rolling walker (2 wheels) Transfers: Sit to/from Stand, Bed to chair/wheelchair/BSC Sit to Stand: Min assist, Mod assist   Step pivot transfers: Mod assist       General transfer comment: very unsteady on feet with buckling of knees due to weakness    Ambulation/Gait Ambulation/Gait assistance: Mod assist, Max assist Gait Distance (Feet): 5 Feet Assistive device: Rolling walker (2 wheels) Gait Pattern/deviations: Decreased step length - right, Decreased step length - left, Decreased stride length, Trunk flexed, Knees buckling Gait velocity: slow     General Gait Details: limited to a few slow labored unsteady steps at bedside before having to sit due weakness and buckling of knees  Stairs            Wheelchair Mobility     Tilt Bed    Modified Rankin (Stroke Patients Only)       Balance Overall balance assessment: Needs assistance Sitting-balance support: Feet supported, No upper extremity supported Sitting balance-Leahy Scale: Fair Sitting balance - Comments: fair/good seated at EOB   Standing balance support: Reliant on assistive device for balance, During functional activity, Bilateral upper extremity supported Standing balance-Leahy Scale: Poor Standing balance comment: using RW  Pertinent Vitals/Pain Pain Assessment Pain Assessment: Faces Faces Pain Scale: Hurts a little bit Pain Location: feet, stomach Pain Descriptors / Indicators: Discomfort, Sore Pain Intervention(s): Limited activity within patient's tolerance, Monitored during session     Home Living Family/patient expects to be discharged to:: Private residence Living Arrangements: Spouse/significant other Available Help at Discharge: Family;Available PRN/intermittently Type of Home: House Home Access: Stairs to enter Entrance Stairs-Rails: Left Entrance Stairs-Number of Steps: 4 Alternate Level Stairs-Number of Steps: 9-10 steps Home Layout: Two level;Able to live on main level with bedroom/bathroom;Full bath on main level Home Equipment: Rolling Walker (2 wheels);Rollator (4 wheels);Grab bars - tub/shower;Shower seat      Prior Function Prior Level of Function : Needs assist       Physical Assist : Mobility (physical);ADLs (physical) Mobility (physical): Bed mobility;Transfers;Gait;Stairs   Mobility Comments: household ambulation using RW ADLs Comments: Independent     Extremity/Trunk Assessment   Upper Extremity Assessment Upper Extremity Assessment: Generalized weakness    Lower Extremity Assessment Lower Extremity Assessment: Generalized weakness    Cervical / Trunk Assessment Cervical / Trunk Assessment: Normal  Communication   Communication Communication: No apparent difficulties    Cognition Arousal: Alert Behavior During Therapy: WFL for tasks assessed/performed   PT - Cognitive impairments: No apparent impairments                         Following commands: Intact       Cueing Cueing Techniques: Verbal cues, Tactile cues     General Comments      Exercises     Assessment/Plan    PT Assessment Patient needs continued PT services  PT Problem List Decreased strength;Decreased activity tolerance;Decreased mobility;Decreased balance       PT Treatment Interventions DME instruction;Gait training;Stair training;Functional mobility training;Therapeutic activities;Therapeutic exercise;Balance training;Patient/family education    PT Goals (Current goals can be found in the Care Plan section)  Acute Rehab PT  Goals Patient Stated Goal: return home with family to assist PT Goal Formulation: With patient Time For Goal Achievement: 06/06/24 Potential to Achieve Goals: Good    Frequency Min 3X/week     Co-evaluation               AM-PAC PT 6 Clicks Mobility  Outcome Measure Help needed turning from your back to your side while in a flat bed without using bedrails?: A Lot Help needed moving from lying on your back to sitting on the side of a flat bed without using bedrails?: A Lot Help needed moving to and from a bed to a chair (including a wheelchair)?: A Lot Help needed standing up from a chair using your arms (e.g., wheelchair or bedside chair)?: A Lot Help needed to walk in hospital room?: A Lot Help needed climbing 3-5 steps with a railing? : Total 6 Click Score: 11    End of Session   Activity Tolerance: Patient tolerated treatment well;Patient limited by fatigue Patient left: in chair;with call bell/phone within reach Nurse Communication: Mobility status PT Visit Diagnosis: Unsteadiness on feet (R26.81);Other abnormalities of gait and mobility (R26.89);Muscle weakness (generalized) (M62.81)    Time: 9072-9041 PT Time Calculation (min) (ACUTE ONLY): 31 min   Charges:   PT Evaluation $PT Eval Moderate Complexity: 1 Mod PT Treatments $Therapeutic Activity: 23-37 mins PT General Charges $$ ACUTE PT VISIT: 1 Visit         2:18 PM, 05/23/2024 Lynwood Music, MPT Physical Therapist with [facility] 336 [phone]  office [phone] mobile phone

## 2024-05-23 NOTE — Plan of Care (Signed)
" °  Problem: Acute Rehab PT Goals(only PT should resolve) Goal: Pt Will Go Supine/Side To Sit Outcome: Progressing Flowsheets (Taken 05/23/2024 1419) Pt will go Supine/Side to Sit:  with minimal assist  with moderate assist Goal: Patient Will Transfer Sit To/From Stand Outcome: Progressing Flowsheets (Taken 05/23/2024 1419) Patient will transfer sit to/from stand: with minimal assist Goal: Pt Will Transfer Bed To Chair/Chair To Bed Outcome: Progressing Flowsheets (Taken 05/23/2024 1419) Pt will Transfer Bed to Chair/Chair to Bed: with min assist Goal: Pt Will Ambulate Outcome: Progressing Flowsheets (Taken 05/23/2024 1419) Pt will Ambulate:  25 feet  with minimal assist  with moderate assist  with rolling walker   2:20 PM, 05/23/2024 Lynwood Music, MPT Physical Therapist with [facility] 336 [phone] office [phone] mobile phone  "

## 2024-06-03 ENCOUNTER — Inpatient Hospital Stay (HOSPITAL_COMMUNITY)
Admission: EM | Admit: 2024-06-03 | Discharge: 2024-06-08 | DRG: 175 | Disposition: A | Discharge status: Skilled Nursing Facility | Attending: Family Medicine | Admitting: Family Medicine

## 2024-06-03 ENCOUNTER — Encounter (HOSPITAL_COMMUNITY): Payer: Self-pay | Admitting: Emergency Medicine

## 2024-06-03 ENCOUNTER — Emergency Department (HOSPITAL_COMMUNITY)

## 2024-06-03 ENCOUNTER — Other Ambulatory Visit: Payer: Self-pay

## 2024-06-03 DIAGNOSIS — Z7901 Long term (current) use of anticoagulants: Secondary | ICD-10-CM

## 2024-06-03 DIAGNOSIS — E872 Acidosis, unspecified: Secondary | ICD-10-CM | POA: Diagnosis present

## 2024-06-03 DIAGNOSIS — Z79899 Other long term (current) drug therapy: Secondary | ICD-10-CM

## 2024-06-03 DIAGNOSIS — Z86711 Personal history of pulmonary embolism: Secondary | ICD-10-CM

## 2024-06-03 DIAGNOSIS — J441 Chronic obstructive pulmonary disease with (acute) exacerbation: Principal | ICD-10-CM | POA: Diagnosis present

## 2024-06-03 DIAGNOSIS — Z9221 Personal history of antineoplastic chemotherapy: Secondary | ICD-10-CM

## 2024-06-03 DIAGNOSIS — E785 Hyperlipidemia, unspecified: Secondary | ICD-10-CM | POA: Diagnosis present

## 2024-06-03 DIAGNOSIS — Z8673 Personal history of transient ischemic attack (TIA), and cerebral infarction without residual deficits: Secondary | ICD-10-CM

## 2024-06-03 DIAGNOSIS — Z8249 Family history of ischemic heart disease and other diseases of the circulatory system: Secondary | ICD-10-CM

## 2024-06-03 DIAGNOSIS — Z96642 Presence of left artificial hip joint: Secondary | ICD-10-CM | POA: Diagnosis present

## 2024-06-03 DIAGNOSIS — Z853 Personal history of malignant neoplasm of breast: Secondary | ICD-10-CM

## 2024-06-03 DIAGNOSIS — I480 Paroxysmal atrial fibrillation: Secondary | ICD-10-CM | POA: Diagnosis present

## 2024-06-03 DIAGNOSIS — Z820 Family history of epilepsy and other diseases of the nervous system: Secondary | ICD-10-CM

## 2024-06-03 DIAGNOSIS — Z66 Do not resuscitate: Secondary | ICD-10-CM | POA: Diagnosis present

## 2024-06-03 DIAGNOSIS — I82432 Acute embolism and thrombosis of left popliteal vein: Secondary | ICD-10-CM | POA: Diagnosis present

## 2024-06-03 DIAGNOSIS — K219 Gastro-esophageal reflux disease without esophagitis: Secondary | ICD-10-CM | POA: Diagnosis present

## 2024-06-03 DIAGNOSIS — F17210 Nicotine dependence, cigarettes, uncomplicated: Secondary | ICD-10-CM | POA: Diagnosis present

## 2024-06-03 DIAGNOSIS — R0902 Hypoxemia: Secondary | ICD-10-CM

## 2024-06-03 DIAGNOSIS — I2699 Other pulmonary embolism without acute cor pulmonale: Principal | ICD-10-CM | POA: Diagnosis present

## 2024-06-03 DIAGNOSIS — Z91148 Patient's other noncompliance with medication regimen for other reason: Secondary | ICD-10-CM

## 2024-06-03 DIAGNOSIS — J9601 Acute respiratory failure with hypoxia: Secondary | ICD-10-CM | POA: Diagnosis present

## 2024-06-03 DIAGNOSIS — Z923 Personal history of irradiation: Secondary | ICD-10-CM

## 2024-06-03 LAB — COMPREHENSIVE METABOLIC PANEL WITH GFR
ALT: 18 U/L (ref 0–44)
AST: 20 U/L (ref 15–41)
Albumin: 3.6 g/dL (ref 3.5–5.0)
Alkaline Phosphatase: 92 U/L (ref 38–126)
Anion gap: 16 — ABNORMAL HIGH (ref 5–15)
BUN: 16 mg/dL (ref 8–23)
CO2: 20 mmol/L — ABNORMAL LOW (ref 22–32)
Calcium: 9 mg/dL (ref 8.9–10.3)
Chloride: 101 mmol/L (ref 98–111)
Creatinine, Ser: 0.7 mg/dL (ref 0.44–1.00)
GFR, Estimated: >60 mL/min
Glucose, Bld: 177 mg/dL — ABNORMAL HIGH (ref 70–99)
Potassium: 4.6 mmol/L (ref 3.5–5.1)
Sodium: 136 mmol/L (ref 135–145)
Total Bilirubin: 0.4 mg/dL (ref 0.0–1.2)
Total Protein: 6.7 g/dL (ref 6.5–8.1)

## 2024-06-03 LAB — CBC WITH DIFFERENTIAL/PLATELET
Abs Immature Granulocytes: 0.15 K/uL — ABNORMAL HIGH (ref 0.00–0.07)
Basophils Absolute: 0.1 K/uL (ref 0.0–0.1)
Basophils Relative: 0 %
Eosinophils Absolute: 0 K/uL (ref 0.0–0.5)
Eosinophils Relative: 0 %
HCT: 40.1 % (ref 36.0–46.0)
Hemoglobin: 13.1 g/dL (ref 12.0–15.0)
Immature Granulocytes: 1 %
Lymphocytes Relative: 11 %
Lymphs Abs: 1.5 K/uL (ref 0.7–4.0)
MCH: 30.5 pg (ref 26.0–34.0)
MCHC: 32.7 g/dL (ref 30.0–36.0)
MCV: 93.3 fL (ref 80.0–100.0)
Monocytes Absolute: 0.7 K/uL (ref 0.1–1.0)
Monocytes Relative: 5 %
Neutro Abs: 11.4 K/uL — ABNORMAL HIGH (ref 1.7–7.7)
Neutrophils Relative %: 83 %
Platelets: 270 K/uL (ref 150–400)
RBC: 4.3 MIL/uL (ref 3.87–5.11)
RDW: 13 % (ref 11.5–15.5)
WBC: 13.8 K/uL — ABNORMAL HIGH (ref 4.0–10.5)
nRBC: 0 % (ref 0.0–0.2)

## 2024-06-03 LAB — LACTIC ACID, PLASMA: Lactic Acid, Venous: 1.7 mmol/L (ref 0.5–1.9)

## 2024-06-03 LAB — PROTIME-INR
INR: 1.1 (ref 0.8–1.2)
Prothrombin Time: 14.8 s (ref 11.4–15.2)

## 2024-06-03 MED ORDER — ONDANSETRON HCL 4 MG/2ML IJ SOLN
4.0000 mg | Freq: Once | INTRAMUSCULAR | Status: AC
  Administered 2024-06-03: 4 mg via INTRAVENOUS
  Filled 2024-06-03: qty 2

## 2024-06-03 MED ORDER — SODIUM CHLORIDE 0.9 % IV BOLUS
1000.0000 mL | Freq: Once | INTRAVENOUS | Status: AC
  Administered 2024-06-03: 1000 mL via INTRAVENOUS

## 2024-06-03 NOTE — ED Notes (Signed)
 Phleb at bedside

## 2024-06-03 NOTE — ED Notes (Signed)
 ED Provider at bedside.

## 2024-06-03 NOTE — ED Triage Notes (Signed)
 Pt bib EMS for possible code sepsis. State they were called out to house after pt c/o N/V/D, SOB, and feeling like she was going to pass out. EMS reports pt's O2 sats on R/A upon their arrival were 84% and pt was pale and diaphoretic. EMS reports they placed on O2 @6L  [place] and sats improved to 93%. EMS also reports pt with HR 115-130's and states pt was dx with Flu a week ago. EMS administered 4mg  Zofran  IM and 1gm Rocephin IM en route.

## 2024-06-04 ENCOUNTER — Observation Stay (HOSPITAL_COMMUNITY)

## 2024-06-04 DIAGNOSIS — F17210 Nicotine dependence, cigarettes, uncomplicated: Secondary | ICD-10-CM | POA: Diagnosis present

## 2024-06-04 DIAGNOSIS — Z96642 Presence of left artificial hip joint: Secondary | ICD-10-CM | POA: Diagnosis present

## 2024-06-04 DIAGNOSIS — Z9221 Personal history of antineoplastic chemotherapy: Secondary | ICD-10-CM | POA: Diagnosis not present

## 2024-06-04 DIAGNOSIS — Z8673 Personal history of transient ischemic attack (TIA), and cerebral infarction without residual deficits: Secondary | ICD-10-CM | POA: Diagnosis not present

## 2024-06-04 DIAGNOSIS — Z79899 Other long term (current) drug therapy: Secondary | ICD-10-CM | POA: Diagnosis not present

## 2024-06-04 DIAGNOSIS — I2609 Other pulmonary embolism with acute cor pulmonale: Secondary | ICD-10-CM | POA: Diagnosis not present

## 2024-06-04 DIAGNOSIS — I480 Paroxysmal atrial fibrillation: Secondary | ICD-10-CM | POA: Diagnosis present

## 2024-06-04 DIAGNOSIS — Z820 Family history of epilepsy and other diseases of the nervous system: Secondary | ICD-10-CM | POA: Diagnosis not present

## 2024-06-04 DIAGNOSIS — Z8249 Family history of ischemic heart disease and other diseases of the circulatory system: Secondary | ICD-10-CM | POA: Diagnosis not present

## 2024-06-04 DIAGNOSIS — Z86711 Personal history of pulmonary embolism: Secondary | ICD-10-CM | POA: Diagnosis not present

## 2024-06-04 DIAGNOSIS — I82432 Acute embolism and thrombosis of left popliteal vein: Secondary | ICD-10-CM | POA: Diagnosis present

## 2024-06-04 DIAGNOSIS — Z923 Personal history of irradiation: Secondary | ICD-10-CM | POA: Diagnosis not present

## 2024-06-04 DIAGNOSIS — Z91148 Patient's other noncompliance with medication regimen for other reason: Secondary | ICD-10-CM | POA: Diagnosis not present

## 2024-06-04 DIAGNOSIS — E785 Hyperlipidemia, unspecified: Secondary | ICD-10-CM

## 2024-06-04 DIAGNOSIS — I2699 Other pulmonary embolism without acute cor pulmonale: Secondary | ICD-10-CM | POA: Diagnosis present

## 2024-06-04 DIAGNOSIS — J9601 Acute respiratory failure with hypoxia: Secondary | ICD-10-CM

## 2024-06-04 DIAGNOSIS — Z66 Do not resuscitate: Secondary | ICD-10-CM | POA: Diagnosis present

## 2024-06-04 DIAGNOSIS — Z853 Personal history of malignant neoplasm of breast: Secondary | ICD-10-CM | POA: Diagnosis not present

## 2024-06-04 DIAGNOSIS — K219 Gastro-esophageal reflux disease without esophagitis: Secondary | ICD-10-CM | POA: Diagnosis present

## 2024-06-04 DIAGNOSIS — Z7901 Long term (current) use of anticoagulants: Secondary | ICD-10-CM | POA: Diagnosis not present

## 2024-06-04 DIAGNOSIS — J441 Chronic obstructive pulmonary disease with (acute) exacerbation: Secondary | ICD-10-CM | POA: Diagnosis present

## 2024-06-04 DIAGNOSIS — E872 Acidosis, unspecified: Secondary | ICD-10-CM | POA: Diagnosis present

## 2024-06-04 LAB — BASIC METABOLIC PANEL WITH GFR
Anion gap: 8 (ref 5–15)
BUN: 14 mg/dL (ref 8–23)
CO2: 25 mmol/L (ref 22–32)
Calcium: 8.3 mg/dL — ABNORMAL LOW (ref 8.9–10.3)
Chloride: 100 mmol/L (ref 98–111)
Creatinine, Ser: 0.55 mg/dL (ref 0.44–1.00)
GFR, Estimated: >60 mL/min
Glucose, Bld: 135 mg/dL — ABNORMAL HIGH (ref 70–99)
Potassium: 4.5 mmol/L (ref 3.5–5.1)
Sodium: 134 mmol/L — ABNORMAL LOW (ref 135–145)

## 2024-06-04 LAB — URINALYSIS, W/ REFLEX TO CULTURE (INFECTION SUSPECTED)
Bacteria, UA: NONE SEEN
Bilirubin Urine: NEGATIVE
Glucose, UA: NEGATIVE mg/dL
Hgb urine dipstick: NEGATIVE
Ketones, ur: NEGATIVE mg/dL
Nitrite: NEGATIVE
Protein, ur: 30 mg/dL — AB
Specific Gravity, Urine: 1.043 — ABNORMAL HIGH (ref 1.005–1.030)
pH: 5 (ref 5.0–8.0)

## 2024-06-04 LAB — RESP PANEL BY RT-PCR (RSV, FLU A&B, COVID)  RVPGX2
Influenza A by PCR: NEGATIVE
Influenza B by PCR: NEGATIVE
Resp Syncytial Virus by PCR: NEGATIVE
SARS Coronavirus 2 by RT PCR: NEGATIVE

## 2024-06-04 LAB — CBC
HCT: 38.2 % (ref 36.0–46.0)
Hemoglobin: 12.3 g/dL (ref 12.0–15.0)
MCH: 30.3 pg (ref 26.0–34.0)
MCHC: 32.2 g/dL (ref 30.0–36.0)
MCV: 94.1 fL (ref 80.0–100.0)
Platelets: 245 K/uL (ref 150–400)
RBC: 4.06 MIL/uL (ref 3.87–5.11)
RDW: 13 % (ref 11.5–15.5)
WBC: 9.8 K/uL (ref 4.0–10.5)
nRBC: 0 % (ref 0.0–0.2)

## 2024-06-04 LAB — HEPARIN LEVEL (UNFRACTIONATED): Heparin Unfractionated: >1.1 [IU]/mL — ABNORMAL HIGH (ref 0.30–0.70)

## 2024-06-04 LAB — LACTIC ACID, PLASMA: Lactic Acid, Venous: 1.1 mmol/L (ref 0.5–1.9)

## 2024-06-04 LAB — ECHOCARDIOGRAM COMPLETE
AV Peak grad: 4 mmHg
Ao pk vel: 1 m/s
Height: 68 in
Weight: 2694.9 [oz_av]

## 2024-06-04 LAB — MRSA NEXT GEN BY PCR, NASAL: MRSA by PCR Next Gen: NOT DETECTED

## 2024-06-04 LAB — PROCALCITONIN: Procalcitonin: <0.1 ng/mL

## 2024-06-04 LAB — APTT: aPTT: 43 s — ABNORMAL HIGH (ref 24–36)

## 2024-06-04 MED ORDER — IPRATROPIUM-ALBUTEROL 0.5-2.5 (3) MG/3ML IN SOLN
3.0000 mL | Freq: Once | RESPIRATORY_TRACT | Status: AC
  Administered 2024-06-04: 3 mL via RESPIRATORY_TRACT
  Filled 2024-06-04: qty 3

## 2024-06-04 MED ORDER — METHYLPREDNISOLONE SODIUM SUCC 40 MG IJ SOLR
40.0000 mg | Freq: Two times a day (BID) | INTRAMUSCULAR | Status: AC
  Administered 2024-06-04 (×2): 40 mg via INTRAVENOUS
  Filled 2024-06-04 (×2): qty 1

## 2024-06-04 MED ORDER — AMIODARONE HCL 200 MG PO TABS
100.0000 mg | ORAL_TABLET | Freq: Every day | ORAL | Status: DC
  Administered 2024-06-04 – 2024-06-08 (×5): 100 mg via ORAL
  Filled 2024-06-04 (×5): qty 1

## 2024-06-04 MED ORDER — METHYLPREDNISOLONE SODIUM SUCC 125 MG IJ SOLR
125.0000 mg | Freq: Once | INTRAMUSCULAR | Status: AC
  Administered 2024-06-04: 125 mg via INTRAVENOUS
  Filled 2024-06-04: qty 2

## 2024-06-04 MED ORDER — TRAZODONE HCL 50 MG PO TABS
25.0000 mg | ORAL_TABLET | Freq: Every evening | ORAL | Status: DC | PRN
  Administered 2024-06-05: 25 mg via ORAL
  Filled 2024-06-04 (×3): qty 1

## 2024-06-04 MED ORDER — ENOXAPARIN SODIUM 40 MG/0.4ML IJ SOSY: 40.0000 mg | PREFILLED_SYRINGE | INTRAMUSCULAR | Status: DC

## 2024-06-04 MED ORDER — PANTOPRAZOLE SODIUM 40 MG IV SOLR
40.0000 mg | Freq: Two times a day (BID) | INTRAVENOUS | Status: DC
  Administered 2024-06-04 – 2024-06-05 (×3): 40 mg via INTRAVENOUS
  Filled 2024-06-04 (×4): qty 10

## 2024-06-04 MED ORDER — OCUVITE-LUTEIN PO CAPS
1.0000 | ORAL_CAPSULE | Freq: Every day | ORAL | Status: DC
  Filled 2024-06-04: qty 1

## 2024-06-04 MED ORDER — PRESERVISION AREDS 2 PO CAPS: ORAL_CAPSULE | Freq: Two times a day (BID) | ORAL | Status: DC

## 2024-06-04 MED ORDER — CHLORHEXIDINE GLUCONATE CLOTH 2 % EX PADS
6.0000 | MEDICATED_PAD | Freq: Every day | CUTANEOUS | Status: DC
  Administered 2024-06-04 – 2024-06-05 (×2): 6 via TOPICAL

## 2024-06-04 MED ORDER — IOHEXOL 350 MG/ML SOLN
75.0000 mL | Freq: Once | INTRAVENOUS | Status: AC | PRN
  Administered 2024-06-04: 75 mL via INTRAVENOUS

## 2024-06-04 MED ORDER — SODIUM CHLORIDE 0.9 % IV SOLN
1.0000 g | INTRAVENOUS | Status: AC
  Administered 2024-06-04 – 2024-06-08 (×5): 1 g via INTRAVENOUS
  Filled 2024-06-04 (×5): qty 10

## 2024-06-04 MED ORDER — HEPARIN (PORCINE) 25000 UT/250ML-% IV SOLN
950.0000 [IU]/h | INTRAVENOUS | Status: DC
  Administered 2024-06-04: 800 [IU]/h via INTRAVENOUS
  Administered 2024-06-05 – 2024-06-06 (×2): 1150 [IU]/h via INTRAVENOUS
  Filled 2024-06-04 (×3): qty 250

## 2024-06-04 MED ORDER — SACCHAROMYCES BOULARDII 250 MG PO CAPS
250.0000 mg | ORAL_CAPSULE | Freq: Two times a day (BID) | ORAL | Status: DC
  Administered 2024-06-04 – 2024-06-07 (×7): 250 mg via ORAL
  Filled 2024-06-04 (×9): qty 1

## 2024-06-04 MED ORDER — SODIUM CHLORIDE 0.9 % IV SOLN: INTRAVENOUS | Status: AC

## 2024-06-04 MED ORDER — CALCIUM CARBONATE ANTACID 500 MG PO CHEW
1.0000 | CHEWABLE_TABLET | Freq: Three times a day (TID) | ORAL | Status: DC | PRN
  Administered 2024-06-05 – 2024-06-06 (×3): 200 mg via ORAL
  Filled 2024-06-04 (×3): qty 1

## 2024-06-04 MED ORDER — IPRATROPIUM-ALBUTEROL 0.5-2.5 (3) MG/3ML IN SOLN
3.0000 mL | Freq: Four times a day (QID) | RESPIRATORY_TRACT | Status: DC
  Administered 2024-06-04 – 2024-06-06 (×8): 3 mL via RESPIRATORY_TRACT
  Filled 2024-06-04 (×9): qty 3

## 2024-06-04 MED ORDER — HYDROCOD POLI-CHLORPHE POLI ER 10-8 MG/5ML PO SUER: 5.0000 mL | Freq: Two times a day (BID) | ORAL | Status: DC | PRN

## 2024-06-04 MED ORDER — MAGNESIUM HYDROXIDE 400 MG/5ML PO SUSP: 30.0000 mL | Freq: Every day | ORAL | Status: DC | PRN

## 2024-06-04 MED ORDER — ONDANSETRON HCL 4 MG/2ML IJ SOLN: 4.0000 mg | Freq: Four times a day (QID) | INTRAMUSCULAR | Status: DC | PRN

## 2024-06-04 MED ORDER — ADULT MULTIVITAMIN W/MINERALS CH
1.0000 | ORAL_TABLET | Freq: Every day | ORAL | Status: DC
  Administered 2024-06-04 – 2024-06-08 (×5): 1 via ORAL
  Filled 2024-06-04 (×5): qty 1

## 2024-06-04 MED ORDER — GUAIFENESIN ER 600 MG PO TB12
600.0000 mg | ORAL_TABLET | Freq: Two times a day (BID) | ORAL | Status: DC
  Administered 2024-06-04 – 2024-06-08 (×10): 600 mg via ORAL
  Filled 2024-06-04 (×10): qty 1

## 2024-06-04 MED ORDER — OMEGA-3-ACID ETHYL ESTERS 1 G PO CAPS
1.0000 g | ORAL_CAPSULE | Freq: Every day | ORAL | Status: DC
  Administered 2024-06-04 – 2024-06-08 (×5): 1 g via ORAL
  Filled 2024-06-04 (×5): qty 1

## 2024-06-04 MED ORDER — ATORVASTATIN CALCIUM 20 MG PO TABS
20.0000 mg | ORAL_TABLET | Freq: Every day | ORAL | Status: DC
  Administered 2024-06-04 – 2024-06-08 (×5): 20 mg via ORAL
  Filled 2024-06-04 (×5): qty 1

## 2024-06-04 MED ORDER — ORAL CARE MOUTH RINSE: 15.0000 mL | OROMUCOSAL | Status: DC | PRN

## 2024-06-04 MED ORDER — PREDNISONE 20 MG PO TABS
40.0000 mg | ORAL_TABLET | Freq: Every day | ORAL | Status: AC
  Administered 2024-06-05 – 2024-06-08 (×4): 40 mg via ORAL
  Filled 2024-06-04 (×4): qty 2

## 2024-06-04 MED ORDER — APIXABAN 5 MG PO TABS
5.0000 mg | ORAL_TABLET | Freq: Two times a day (BID) | ORAL | Status: DC
  Administered 2024-06-04: 5 mg via ORAL
  Filled 2024-06-04: qty 1

## 2024-06-04 MED ORDER — PROSIGHT PO TABS
1.0000 | ORAL_TABLET | Freq: Every day | ORAL | Status: DC
  Administered 2024-06-04 – 2024-06-07 (×4): 1 via ORAL
  Filled 2024-06-04 (×4): qty 1

## 2024-06-04 MED ORDER — ONDANSETRON HCL 4 MG PO TABS: 4.0000 mg | ORAL_TABLET | Freq: Four times a day (QID) | ORAL | Status: DC | PRN

## 2024-06-04 MED ORDER — IPRATROPIUM-ALBUTEROL 0.5-2.5 (3) MG/3ML IN SOLN
3.0000 mL | RESPIRATORY_TRACT | Status: DC | PRN
  Administered 2024-06-06: 3 mL via RESPIRATORY_TRACT

## 2024-06-04 NOTE — Assessment & Plan Note (Addendum)
Will continue statin therapy and Lovaza

## 2024-06-04 NOTE — Assessment & Plan Note (Addendum)
-   This is likely secondary to #1 and #2. - We will continue amiodarone . - Will continue Eliquis  for now pending chest CTA. - RVR is currently fairly controlled with management of her COPD. - We will utilize IV Cardizem  as needed.

## 2024-06-04 NOTE — ED Provider Notes (Signed)
 " [facility] EMERGENCY DEPARTMENT AT [facility] Provider Note   CSN: [id number] Arrival date & time: 06/03/24  2230     Patient presents with: Cose Sepsis   [name] is a 79 y.o. female.   Patient is an 79 year old female with history of COPD, hyperlipidemia, normal pressure hydrocephalus, and recent admission for hypoxia in the setting of influenza A.  Patient brought by EMS for evaluation of weakness and fatigue.  She attempted to sit on the toilet this evening when she became very weak and could not get up.  EMS was called and patient was found to have oxygen saturations of 84%.  She was then transported here.  EMS initiated a code sepsis, however patient arrives here afebrile with stable vital signs.       Prior to Admission medications  Medication Sig Start Date End Date Taking? Authorizing Provider  acetaminophen  (TYLENOL ) 500 MG tablet Take 1 tablet (500 mg total) by mouth every 6 (six) hours as needed for mild pain (pain score 1-3), fever or headache. 05/23/24   Ricky Fines, MD  albuterol  (VENTOLIN  HFA) 108 (90 Base) MCG/ACT inhaler Inhale 1 puff into the lungs every 4 (four) hours as needed for wheezing or shortness of breath. 05/23/24   Ricky Fines, MD  amiodarone  (PACERONE ) 100 MG tablet Take 100 mg by mouth daily.    [provider]  apixaban  (ELIQUIS ) 5 MG TABS tablet Take 1 tablet (5 mg total) by mouth 2 (two) times daily. 01/20/23   Ladona Heinz, MD  atorvastatin  (LIPITOR) 20 MG tablet Take 1 tablet (20 mg total) by mouth daily. 11/03/23   Ladona Heinz, MD  Multiple Vitamin (MULTIVITAMIN WITH MINERALS) TABS tablet Take 1 tablet by mouth daily.    [provider]  Multiple Vitamins-Minerals (PRESERVISION AREDS 2) CAPS Take by mouth 2 (two) times daily.     [provider]  Omega-3 Fatty Acids (FISH OIL) 1000 MG CAPS Take by mouth daily.    [provider]  saccharomyces boulardii (FLORASTOR) 250 MG capsule Take 1 capsule  (250 mg total) by mouth 2 (two) times daily. 05/23/24   Ricky Fines, MD    Allergies: Patient has no known allergies.    Review of Systems  All other systems reviewed and are negative.   Updated Vital Signs BP 110/76   Pulse 91   Temp 97.8 F (36.6 C) (Oral)   Resp (!) 22   Ht 5' 8 (1.727 m)   Wt 79 kg   SpO2 91%   BMI 26.48 kg/m   Physical Exam Vitals and nursing note reviewed.  Constitutional:      General: She is not in acute distress.    Appearance: She is well-developed. She is not diaphoretic.  HENT:     Head: Normocephalic and atraumatic.  Cardiovascular:     Rate and Rhythm: Normal rate and regular rhythm.     Heart sounds: No murmur heard.    No friction rub. No gallop.  Pulmonary:     Effort: Pulmonary effort is normal. No respiratory distress.     Breath sounds: Normal breath sounds. No wheezing.  Abdominal:     General: Bowel sounds are normal. There is no distension.     Palpations: Abdomen is soft.     Tenderness: There is no abdominal tenderness.  Musculoskeletal:        General: Normal range of motion.     Cervical back: Normal range of motion and neck supple.  Skin:    General: Skin is warm and dry.  Neurological:     General: No focal deficit present.     Mental Status: She is alert and oriented to person, place, and time.     (all labs ordered are listed, but only abnormal results are displayed) Labs Reviewed  COMPREHENSIVE METABOLIC PANEL WITH GFR - Abnormal; Notable for the following components:      Result Value   CO2 20 (*)    Glucose, Bld 177 (*)    Anion gap 16 (*)    All other components within normal limits  CBC WITH DIFFERENTIAL/PLATELET - Abnormal; Notable for the following components:   WBC 13.8 (*)    Neutro Abs 11.4 (*)    Abs Immature Granulocytes 0.15 (*)    All other components within normal limits  CULTURE, BLOOD (ROUTINE X 2)  CULTURE, BLOOD (ROUTINE X 2)  RESP PANEL BY RT-PCR (RSV, FLU A&B, COVID)  [id number]   GASTROINTESTINAL PANEL BY PCR, STOOL (REPLACES STOOL CULTURE)  C DIFFICILE QUICK SCREEN W PCR REFLEX    LACTIC ACID, PLASMA  PROTIME-INR  LACTIC ACID, PLASMA  URINALYSIS, W/ REFLEX TO CULTURE (INFECTION SUSPECTED)    EKG: None  Radiology: DG Chest Port 1 View Result Date: 06/03/2024 EXAM: 1 VIEW(S) XRAY OF THE CHEST 06/03/2024 11:10:11 PM COMPARISON: Comparison with 05/22/2024. CLINICAL HISTORY: Questionable sepsis - evaluate for abnormality. Nausea, vomiting, diarrhea, shortness of breath. FINDINGS: LINES, TUBES AND DEVICES: A loop recorder is present. LUNGS AND PLEURA: Shallow inspiration. Emphysematous changes and fibrosis in the lungs. Atelectasis in the lung bases. Scarring in the lung apices with pleural thickening in the left apex. No focal consolidation. No pleural effusion. No pneumothorax. Appearances are similar to prior study. HEART AND MEDIASTINUM: Mediastinal contours appear intact. Calcification of the aorta. BONES AND SOFT TISSUES: Surgical clips in the left axilla. Degenerative changes in the spine. No acute osseous abnormality. IMPRESSION: 1. No focal consolidation, pleural effusion, or pneumothorax. 2. Mild bibasilar atelectasis. 3. Emphysematous changes and pulmonary fibrosis, similar to prior study. Electronically signed by: Elsie Gravely MD 06/03/2024 11:41 PM EST RP Workstation: [id number]     Procedures   Medications Ordered in the ED  methylPREDNISolone  sodium succinate (SOLU-MEDROL ) 125 mg/2 mL injection 125 mg (has no administration in time range)  ipratropium-albuterol  (DUONEB) 0.5-2.5 (3) MG/3ML nebulizer solution 3 mL (has no administration in time range)  sodium chloride  0.9 % bolus 1,000 mL (1,000 mLs Intravenous New Bag/Given 06/03/24 2316)  ondansetron  (ZOFRAN ) injection 4 mg (4 mg Intravenous Given 06/03/24 2316)                                    Medical Decision Making Risk Prescription drug management. Decision regarding  hospitalization.   Patient brought by EMS for evaluation of weakness and low oxygen saturations as described in the HPI.  Patient arrives here with stable vital signs and is afebrile.  Oxygen saturations in the mid 80s on room air.  Physical examination basically unremarkable.  Laboratory studies obtained including CBC, CMP, lactate.  White count is 13.8, but laboratory studies otherwise unremarkable.  Chest x-ray showing no acute process.  Patient presenting with weakness and hypoxia as described in the HPI.  I suspect a COPD exacerbation.  She was given steroids and a DuoNeb, but remains with an oxygen requirement.  She will be admitted to the hospitalist service for further care.  Patient does  take Eliquis  so PE less likely.     Final diagnoses:  None    ED Discharge Orders     None          Geroldine Berg, MD 06/04/24 0530  "

## 2024-06-04 NOTE — Assessment & Plan Note (Signed)
-   We will place the patient IV steroid therapy with IV Solu-Medrol as well as nebulized bronchodilator therapy with duonebs q.i.d. and q.4 hours p.r.n.Marland Kitchen - Mucolytic therapy will be provided with Mucinex and antibiotic therapy with IV Rocephin. - O2 protocol will be followed.

## 2024-06-04 NOTE — Assessment & Plan Note (Signed)
-   This could be related to COPD acute exacerbation. - Given noncompliance with Eliquis  for discharge, we will need to rule out PE.  She could be having an Eliquis  failure as well. - She will be admitted to a telemetry observation bed. - Will obtain a chest CTA to rule out PE and underlying pneumonia. - Management otherwise as below.

## 2024-06-04 NOTE — Progress Notes (Signed)
 PHARMACY - ANTICOAGULATION CONSULT NOTE  Pharmacy Consult for IV heparin   Indication: acute PE  Allergies[1]  Patient Measurements: Height: 5' 8 (172.7 cm) Weight: 76.4 kg (168 lb 6.9 oz) IBW/kg (Calculated) : 63.9 HEPARIN  DW (KG): 76.4  Vital Signs: Temp: 97.4 F (36.3 C) (01/03 1155) Temp Source: Oral (01/03 1155) BP: 135/111 (01/03 1339) Pulse Rate: 76 (01/03 1339)  Labs: Recent Labs    06/03/24 2250 06/04/24 0439 06/04/24 1406  HGB 13.1 12.3  --   HCT 40.1 38.2  --   PLT 270 245  --   APTT  --   --  43*  LABPROT 14.8  --   --   INR 1.1  --   --   HEPARINUNFRC  --   --  >1.10*  CREATININE 0.70 0.55  --     Estimated Creatinine Clearance: 56.6 mL/min (by C-G formula based on SCr of 0.55 mg/dL).   Medical History: Past Medical History:  Diagnosis Date   Arthritis    Breast cancer (HCC)    left breast/lumpectomy/chemo/rad 2003   COPD (chronic obstructive pulmonary disease) (HCC)    DDD (degenerative disc disease), lumbar 08/31/2017   Diverticulitis    GERD (gastroesophageal reflux disease)    Hemoptysis    Hyperlipidemia    Iron deficiency    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 06/15/2021   Macular degeneration    bilateral   Personal history of radiation therapy 2003   Pulmonary embolism (HCC)    Syncope and collapse 05/17/2021   Tachycardia     Assessment: 79 yo female with extensive PE.  Reportedly on Eliquis  PTA.  Given extent of clot, will start IV heparin  now.  Discussed with Dr. Lawence, just had Eliquis  dose at 345 AM but given suspicion for Eliquis  failure will start heparin  drip now.  APTT 43, subtherapeutic, will adjust . No issues with infusion HL > 1.1 as expected with taking eliquis   Goal of Therapy:  Heparin  level 0.3-0.7 units/ml APTT 66-102 Monitor platelets by anticoagulation protocol: Yes   Plan:  Increase Heparin  infusion to 950 units/hr Check APTT in 8 hrs Daily heparin  level, aPTT and CBC.  Meloni Hinz, BS Pharm D,  BCPS Clinical Pharmacist  06/04/2024 3:16 PM           [1] No Known Allergies

## 2024-06-04 NOTE — Progress Notes (Signed)
 [facility] PROGRESS NOTE  [name] (DOB: 23-Dec-1945) FMW:[id number] PCP: Trudy Vaughn FALCON, MD  Brief Narrative: [name] is an 79 year old woman with COPD, PAF and prior PE with inconsistent use of DOAC, recent admission for influenza A (discharged 12/22) who presented on 1/2 with persistent cough, weakness, and poor oral intake, culminating in functional collapse at home with inability to rise from the toilet. On arrival she was tachycardic (AFib w/RVR), hypoxemic requiring 2 L O2, work up revealing neutrophilic leukocytosis (WBC 13.8k), metabolic acidosis (CO? 20, AG 16) but normal lactate. CXR showed chronic emphysematous and fibrotic changes with bibasilar atelectasis and no focal consolidation. Subsequent CTA revealed extensive bilateral PE's with near-occlusive clot burden in multiple lobar branches. Mild RV/RA dilatation. Heparin  started, echo ordered, and she was admitted to SDU earlier this morning.   Subjective: No change since admission.   Objective: BP 139/67 (BP Location: Left Arm)   Pulse 81   Temp (!) 97.5 F (36.4 C) (Axillary)   Resp (!) 24   Ht 5' 8 (1.727 m)   Wt 76.4 kg   SpO2 97%   BMI 25.61 kg/m   Gen: Elderly female in no acute distress Pulm: Nonlabored, diminished, no wheezes  CV: Irreg irreg, no rub or gallop, +LE edema GI: Soft, NT, ND, +BS  Neuro: Alert and oriented. No new focal deficits. Ext: Warm, no deformities. Skin: No new rashes, lesions or ulcers on visualized skin   Assessment & Plan: Acute, recurrent hypoxemic respiratory failure: Due to PE > AECOPD, lowered pulmonary reserve.  - Continue supplemental oxygen for goal SpO2 >90% and normal work of breathing. Will need ambulatory pulse oximetry prior to discharge.  - Treat conditions as below  Bilateral PE's:  - Continue heparin  gtt (level, aPTT at 14:00). Given inconsistent DOAC use, do not feel this represents treatment failure, plan to restart once hemodynamics prove  stable.  - Echocardiogram and LE venous U/S pending - Given severity of clot burden/submassive/intermediate risk features, but relative mild presentation, suspect subacute PE's. Will check V/Q likely after discharge (unavailable currently) and follow up with pulmonary/cardiology for further work up of CTEPH.   AECOPD:  - Continue steroids. Given no wheezing on exam, will deescalate - Continue scheduled and prn bronchodilators - Continue ceftriaxone  pending PCT (no infiltrate, WBC likely related to PE) - Completed tamiflu  for recent influenza, no further isolation/precautions/treatments needed for this. RVP pending.  PAF with RVR: Precipitated by conditions above.  - Remains in AFib but rate has been < 100 this morning. Can give diltiazem  prn, attempting to avoid beta blocker with bronchospasm. Note last echo 2019 showed LVEF 60-65%. If rate control becomes difficult, could also change albuterol  to xopenex.  - Continue amiodarone  (may not be best long term option for her with age and lung disease). Check TSH, LFTs.  - On anticoagulation as above  UTI: Suspected from pyuria + dysuria. No bacteria on micro though.  - Urine culture pending - On CTX regardless.   Generalized weakness: No focal deficits. Deconditioning from recent flu/admission, poor po intake.  - PT/OT (HH-PT/OT arranged at recent discharge), may be delayed in assessment for 24 hours per their protocol.  History of CVA, HLD: Recent MRI showed remote right cerebellar infarct.   - Continue anticoagulation as above and statin.   History of breast CA s/p lumpectomy, chemo, radiation: UTD on mammograms per pt with no recurrence.   [name], MD [facility] [email or web address] 06/04/2024, 7:12 AM

## 2024-06-04 NOTE — H&P (Addendum)
 "     [place]   PATIENT NAME: [name]    MR#:  [id number]  DATE OF BIRTH:  [birth year]  DATE OF ADMISSION:  06/03/2024  PRIMARY CARE PHYSICIAN: Trudy Vaughn FALCON, MD   Patient is coming from: Home  REQUESTING/REFERRING PHYSICIAN: Geroldine Berg, MD   CHIEF COMPLAINT:   Chief Complaint  Patient presents with   Cose Sepsis    HISTORY OF PRESENT ILLNESS:  [name] is a 79 y.o. Caucasian female with medical history significant for osteoarthritis, COPD, DDD, diverticulitis, paroxysmal atrial fibrillation, GERD, dyslipidemia, pulmonary embolism with inconsistency with compliance to Eliquis  over the last 3 weeks, who was recently admitted here for influenza A and acute respiratory failure with hypoxia and discharged on 12/22, and presents tonight with significant generalized weakness with inability to get up from her toilet.  She stated that her cough has not improved over the last 3 weeks.  She did not have any significantly worsening dyspnea and denied wheezing.  She is not on oxygen at home.  She admitted to nausea with significant subsequent anorexia.  She denied any vomiting or diarrhea or abdominal pain.  No bleeding diathesis.  No chest pain or palpitations.  She admitted to dysuria yesterday without urinary frequency or urgency or flank pain.  During my interview Pulsoxymeter was 90 to 91% on 2 L of O2 by nasal cannula.  ED Course: When the patient came to the ER, BP was 140/66 with heart rate of 110 and respiratory rate of 23 and pulse symmetry of 88% on 2 L of O2 by nasal cannula and later 90 to 91%.  Later on it went up to 95% on 2 L.  CMP revealed a CO2 of 20 and blood glucose of 177 with anion gap of 16 with otherwise unremarkable CMP.  CBC showed leukocytosis at 13.8 with neutrophilia.  PT was 14.8 and INR 1.1.  Respiratory panel is currently pending.  Blood cultures were drawn. EKG as reviewed by me : EKG showed atrial fibrillation with rapid ventricular sponsor 121  with PVCs, right bundle branch block and left anterior fascicular block. Imaging: Portable chest x-ray showed the following: 1. No focal consolidation, pleural effusion, or pneumothorax. 2. Mild bibasilar atelectasis. 3. Emphysematous changes and pulmonary fibrosis, similar to prior study.  The patient was given 1 L bolus of IV normal saline, 4 mg of IV Zofran , 125 mg of IV Solu-Medrol  and DuoNeb.  She will be admitted to an observation telemetry bed for further evaluation and management. PAST MEDICAL HISTORY:   Past Medical History:  Diagnosis Date   Arthritis    Breast cancer (HCC)    left breast/lumpectomy/chemo/rad 2003   COPD (chronic obstructive pulmonary disease) (HCC)    DDD (degenerative disc disease), lumbar 08/31/2017   Diverticulitis    GERD (gastroesophageal reflux disease)    Hemoptysis    Hyperlipidemia    Iron deficiency    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 06/15/2021   Macular degeneration    bilateral   Personal history of radiation therapy 2003   Pulmonary embolism (HCC)    Syncope and collapse 05/17/2021   Tachycardia     PAST SURGICAL HISTORY:   Past Surgical History:  Procedure Laterality Date   BACK SURGERY     BREAST LUMPECTOMY     COLONOSCOPY  01/28/2012   Procedure: COLONOSCOPY;  Surgeon: Claudis RAYMOND Rivet, MD;  Location: [facility];  Service: Endoscopy;  Laterality: N/A;  1200   COLONOSCOPY  01-2012  ESOPHAGOGASTRODUODENOSCOPY N/A 07/01/2013   Procedure: ESOPHAGOGASTRODUODENOSCOPY (EGD);  Surgeon: Claudis RAYMOND Rivet, MD;  Location: [facility];  Service: Endoscopy;  Laterality: N/A;  1:10   LAPAROSCOPIC TUBAL LIGATION  1970   TONSILLECTOMY     Patient was age 89   TOTAL HIP ARTHROPLASTY Left 04/25/2022   Procedure: TOTAL HIP ARTHROPLASTY ANTERIOR APPROACH;  Surgeon: Vernetta Lonni GRADE, MD;  Location: [facility];  Service: Orthopedics;  Laterality: Left;    SOCIAL HISTORY:   Social History   Tobacco Use   Smoking status: Some Days     Current packs/day: 0.50    Average packs/day: 0.5 packs/day for 30.0 years (15.0 ttl pk-yrs)    Types: Cigarettes   Smokeless tobacco: Never   Tobacco comments:    05/17/18 1- 1.5 PPD  Substance Use Topics   Alcohol  use: No    Alcohol /week: 0.0 standard drinks of alcohol     FAMILY HISTORY:   Family History  Problem Relation Age of Onset   Heart disease Mother    Alzheimer's disease Mother    Heart disease Father    Lung disease Father    Healthy Son     DRUG ALLERGIES:  Allergies[1]  REVIEW OF SYSTEMS:   ROS As per history of present illness. All pertinent systems were reviewed above. Constitutional, HEENT, cardiovascular, respiratory, GI, GU, musculoskeletal, neuro, psychiatric, endocrine, integumentary and hematologic systems were reviewed and are otherwise negative/unremarkable except for positive findings mentioned above in the HPI.   MEDICATIONS AT HOME:   Prior to Admission medications  Medication Sig Start Date End Date Taking? Authorizing Provider  acetaminophen  (TYLENOL ) 500 MG tablet Take 1 tablet (500 mg total) by mouth every 6 (six) hours as needed for mild pain (pain score 1-3), fever or headache. 05/23/24   Ricky Fines, MD  albuterol  (VENTOLIN  HFA) [phone] Base) MCG/ACT inhaler Inhale 1 puff into the lungs every 4 (four) hours as needed for wheezing or shortness of breath. 05/23/24   Ricky Fines, MD  amiodarone  (PACERONE ) 100 MG tablet Take 100 mg by mouth daily.    [provider]  apixaban  (ELIQUIS ) 5 MG TABS tablet Take 1 tablet (5 mg total) by mouth 2 (two) times daily. 01/20/23   Ladona Heinz, MD  atorvastatin  (LIPITOR) 20 MG tablet Take 1 tablet (20 mg total) by mouth daily. 11/03/23   Ladona Heinz, MD  Multiple Vitamin (MULTIVITAMIN WITH MINERALS) TABS tablet Take 1 tablet by mouth daily.    [provider]  Multiple Vitamins-Minerals (PRESERVISION AREDS 2) CAPS Take by mouth 2 (two) times daily.     [provider]  Omega-3  Fatty Acids (FISH OIL) 1000 MG CAPS Take by mouth daily.    [provider]  saccharomyces boulardii (FLORASTOR) 250 MG capsule Take 1 capsule (250 mg total) by mouth 2 (two) times daily. 05/23/24   Ricky Fines, MD      VITAL SIGNS:  Blood pressure (!) (P) 148/61, pulse (P) 89, temperature (P) 97.9 F (36.6 C), resp. rate 19, height 5' 8 (1.727 m), weight 79 kg, SpO2 90%.  PHYSICAL EXAMINATION:  Physical Exam  GENERAL:  79 y.o.-year-old patient lying in the bed with no acute distress.  EYES: Pupils equal, round, reactive to light and accommodation. No scleral icterus. Extraocular muscles intact.  HEENT: Head atraumatic, normocephalic. Oropharynx and nasopharynx clear.  NECK:  Supple, no jugular venous distention. No thyroid  enlargement, no tenderness.  LUNGS: Chest CARDIOVASCULAR: Regular rate and rhythm, S1, S2 normal. No murmurs, rubs, or  gallops.  ABDOMEN: Soft, nondistended, nontender. Bowel sounds present. No organomegaly or mass.  EXTREMITIES: No pedal edema, cyanosis, or clubbing.  NEUROLOGIC: Cranial nerves II through XII are intact. Muscle strength 5/5 in all extremities. Sensation intact. Gait not checked.  PSYCHIATRIC: The patient is alert and oriented x 3.  Normal affect and good eye contact. SKIN: No obvious rash, lesion, or ulcer.   LABORATORY PANEL:   CBC Recent Labs  Lab 06/03/24 2250  WBC 13.8*  HGB 13.1  HCT 40.1  PLT 270   ------------------------------------------------------------------------------------------------------------------  Chemistries  Recent Labs  Lab 06/03/24 2250  NA 136  K 4.6  CL 101  CO2 20*  GLUCOSE 177*  BUN 16  CREATININE 0.70  CALCIUM  9.0  AST 20  ALT 18  ALKPHOS 92  BILITOT 0.4   ------------------------------------------------------------------------------------------------------------------  Cardiac Enzymes No results for input(s): TROPONINI in the last 168  hours. ------------------------------------------------------------------------------------------------------------------  RADIOLOGY:  DG Chest Port 1 View Result Date: 06/03/2024 EXAM: 1 VIEW(S) XRAY OF THE CHEST 06/03/2024 11:10:11 PM COMPARISON: Comparison with 05/22/2024. CLINICAL HISTORY: Questionable sepsis - evaluate for abnormality. Nausea, vomiting, diarrhea, shortness of breath. FINDINGS: LINES, TUBES AND DEVICES: A loop recorder is present. LUNGS AND PLEURA: Shallow inspiration. Emphysematous changes and fibrosis in the lungs. Atelectasis in the lung bases. Scarring in the lung apices with pleural thickening in the left apex. No focal consolidation. No pleural effusion. No pneumothorax. Appearances are similar to prior study. HEART AND MEDIASTINUM: Mediastinal contours appear intact. Calcification of the aorta. BONES AND SOFT TISSUES: Surgical clips in the left axilla. Degenerative changes in the spine. No acute osseous abnormality. IMPRESSION: 1. No focal consolidation, pleural effusion, or pneumothorax. 2. Mild bibasilar atelectasis. 3. Emphysematous changes and pulmonary fibrosis, similar to prior study. Electronically signed by: Elsie Gravely MD 06/03/2024 11:41 PM EST RP Workstation: [id number]    IMPRESSION AND PLAN:  Assessment and Plan: * Acute respiratory failure with hypoxia (HCC) - This could be related to COPD acute exacerbation. - Given noncompliance with Eliquis  for discharge, we will need to rule out PE.  She could be having an Eliquis  failure as well. - She will be admitted to a telemetry observation bed. - Will obtain a chest CTA to rule out PE and underlying pneumonia. - Management otherwise as below.  COPD exacerbation (HCC) - We will place the patient IV steroid therapy with IV Solu-Medrol  as well as nebulized bronchodilator therapy with duonebs q.i.d. and q.4 hours p.r.n.SABRA - Mucolytic therapy will be provided with Mucinex  and antibiotic therapy with IV Rocephin . -  O2 protocol will be followed.   Paroxysmal atrial fibrillation with RVR (HCC) - This is likely secondary to #1 and #2. - We will continue amiodarone . - Will continue Eliquis  for now pending chest CTA. - RVR is currently fairly controlled with management of her COPD. - We will utilize IV Cardizem  as needed.  Dyslipidemia - Will continue statin therapy and Lovaza .   DVT prophylaxis: Lovenox . Advanced Care Planning:  Code Status: Patient is DNR and DNI.  This was discussed with her. Family Communication:  The plan of care was discussed in details with the patient (and family). I answered all questions. The patient agreed to proceed with the above mentioned plan. Further management will depend upon hospital course. Disposition Plan: Back to previous home environment Consults called: none. All the records are reviewed and case discussed with ED provider.  Status is: Observation  I certify that at the time of admission, it is my clinical judgment that  the patient will require hospital care extending less than 2 midnights.                            Dispo: The patient is from: Home              Anticipated d/c is to: Home              Patient currently is not medically stable to d/c.              Difficult to place patient: No  Madison DELENA Peaches M.D on 06/04/2024 at 3:30 AM  [facility]   From 7 PM-7 AM, contact night-coverage [email or web address]  CC: Primary care physician; Trudy Vaughn FALCON, MD     [1] No Known Allergies  "

## 2024-06-04 NOTE — Progress Notes (Signed)
 PT Cancellation Note  Patient Details Name: [name] MRN: [id number] DOB: 09/21/1945   Cancelled Treatment:    Reason Eval/Treat Not Completed: Medical issues which prohibited therapy. Patient on heparin  for PE and not in therapeutic range at this time. Will check back tomorrow.   11:58 AM, 06/04/2024 Lynwood Music, MPT Physical Therapist with [facility] [phone] office [phone] mobile phone

## 2024-06-04 NOTE — Progress Notes (Signed)
 Went in to give patient her breathing treatment; patient stated that she was talking to the pharmacy tech first then she wanted her treatment.  I explained that I had other patient's needing treatments and I would go do them now and come back to her.

## 2024-06-04 NOTE — Sepsis Progress Note (Addendum)
 Elink monitoring for the code sepsis protocol.   Notified bedside nurse of need to order antibiotics. Patient did receive 1gm Rocephin  IM en route via EMS

## 2024-06-04 NOTE — Progress Notes (Signed)
 PHARMACY - ANTICOAGULATION CONSULT NOTE  Pharmacy Consult for IV heparin   Indication: acute PE  Allergies[1]  Patient Measurements: Height: 5' 8 (172.7 cm) Weight: 76.4 kg (168 lb 6.9 oz) IBW/kg (Calculated) : 63.9 HEPARIN  DW (KG): 76.4  Vital Signs: Temp: 97.9 F (36.6 C) (01/03 0327) Temp Source: Oral (01/03 0327) BP: 148/61 (01/03 0327) Pulse Rate: 89 (01/03 0327)  Labs: Recent Labs    06/03/24 2250  HGB 13.1  HCT 40.1  PLT 270  LABPROT 14.8  INR 1.1  CREATININE 0.70    Estimated Creatinine Clearance: 56.6 mL/min (by C-G formula based on SCr of 0.7 mg/dL).   Medical History: Past Medical History:  Diagnosis Date   Arthritis    Breast cancer (HCC)    left breast/lumpectomy/chemo/rad 2003   COPD (chronic obstructive pulmonary disease) (HCC)    DDD (degenerative disc disease), lumbar 08/31/2017   Diverticulitis    GERD (gastroesophageal reflux disease)    Hemoptysis    Hyperlipidemia    Iron deficiency    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 06/15/2021   Macular degeneration    bilateral   Personal history of radiation therapy 2003   Pulmonary embolism (HCC)    Syncope and collapse 05/17/2021   Tachycardia     Assessment: 79 yo female with extensive PE.  Reportedly on Eliquis  PTA.  Given extent of clot, will start IV heparin  now.  Discussed with Dr. Lawence, just had Eliquis  dose at 345 AM but given suspicion for Eliquis  failure will start heparin  drip now.  Goal of Therapy:  Heparin  level 0.3-0.7 units/ml APTT 66-102 Monitor platelets by anticoagulation protocol: Yes   Plan:  Start IV Heparin  800 units/hr Check heparin  level and APTT in 8 hrs Daily heparin  level, aPTT and CBC.  Harlene Barlow, [employer], BCCP Clinical Pharmacist  06/04/2024 5:28 AM   [facility] pharmacy phone numbers are listed on [email or web address]       [1] No Known Allergies

## 2024-06-05 DIAGNOSIS — J9601 Acute respiratory failure with hypoxia: Secondary | ICD-10-CM

## 2024-06-05 LAB — HEPARIN LEVEL (UNFRACTIONATED): Heparin Unfractionated: 0.83 [IU]/mL — ABNORMAL HIGH (ref 0.30–0.70)

## 2024-06-05 LAB — COMPREHENSIVE METABOLIC PANEL WITH GFR
ALT: 17 U/L (ref 0–44)
AST: 15 U/L (ref 15–41)
Albumin: 3.2 g/dL — ABNORMAL LOW (ref 3.5–5.0)
Alkaline Phosphatase: 73 U/L (ref 38–126)
Anion gap: 8 (ref 5–15)
BUN: 18 mg/dL (ref 8–23)
CO2: 24 mmol/L (ref 22–32)
Calcium: 8.6 mg/dL — ABNORMAL LOW (ref 8.9–10.3)
Chloride: 107 mmol/L (ref 98–111)
Creatinine, Ser: 0.59 mg/dL (ref 0.44–1.00)
GFR, Estimated: >60 mL/min
Glucose, Bld: 141 mg/dL — ABNORMAL HIGH (ref 70–99)
Potassium: 4.5 mmol/L (ref 3.5–5.1)
Sodium: 139 mmol/L (ref 135–145)
Total Bilirubin: <0.2 mg/dL (ref 0.0–1.2)
Total Protein: 5.6 g/dL — ABNORMAL LOW (ref 6.5–8.1)

## 2024-06-05 LAB — URINE CULTURE: Culture: <10000 — AB

## 2024-06-05 LAB — APTT
aPTT: 58 s — ABNORMAL HIGH (ref 24–36)
aPTT: 82 s — ABNORMAL HIGH (ref 24–36)
aPTT: 90 s — ABNORMAL HIGH (ref 24–36)

## 2024-06-05 LAB — CBC
HCT: 33 % — ABNORMAL LOW (ref 36.0–46.0)
Hemoglobin: 10.7 g/dL — ABNORMAL LOW (ref 12.0–15.0)
MCH: 30.3 pg (ref 26.0–34.0)
MCHC: 32.4 g/dL (ref 30.0–36.0)
MCV: 93.5 fL (ref 80.0–100.0)
Platelets: 227 K/uL (ref 150–400)
RBC: 3.53 MIL/uL — ABNORMAL LOW (ref 3.87–5.11)
RDW: 13 % (ref 11.5–15.5)
WBC: 11.8 K/uL — ABNORMAL HIGH (ref 4.0–10.5)
nRBC: 0 % (ref 0.0–0.2)

## 2024-06-05 LAB — TSH: TSH: 0.16 u[IU]/mL — ABNORMAL LOW (ref 0.350–4.500)

## 2024-06-05 MED ORDER — SODIUM CHLORIDE 0.9 % IV SOLN: INTRAVENOUS | Status: DC

## 2024-06-05 NOTE — Progress Notes (Signed)
 300 nurse received report from [facility] from the ICU. PT is coming to room 315

## 2024-06-05 NOTE — Progress Notes (Signed)
 [facility] PROGRESS NOTE  [name] (DOB: [birth year]) FMW:[id number] PCP: Trudy Vaughn FALCON, MD  Brief Narrative: [name] is an 79 year old woman with COPD, PAF and prior PE with inconsistent use of DOAC, recent admission for influenza A (discharged 12/22) who presented on 1/2 with persistent cough, weakness, and poor oral intake, culminating in functional collapse at home with inability to rise from the toilet. On arrival she was tachycardic (AFib w/RVR), hypoxemic requiring 2 L O2, work up revealing neutrophilic leukocytosis (WBC 13.8k), metabolic acidosis (CO? 20, AG 16) but normal lactate. CXR showed chronic emphysematous and fibrotic changes with bibasilar atelectasis and no focal consolidation. Subsequent CTA revealed extensive bilateral PE's with near-occlusive clot burden in multiple lobar branches. Mild RV/RA dilatation. Heparin  started, echo ordered, and she was admitted to SDU earlier this morning.   Subjective: Feels much much better. Tolerating diet, no further diarrhea. No chest pain, dyspnea improved as well.   Objective: BP (!) 137/48   Pulse 71   Temp 97.7 F (36.5 C) (Axillary)   Resp 19   Ht 5' 8 (1.727 m)   Wt 76.4 kg   SpO2 97%   BMI 25.61 kg/m   Gen: No distress Pulm: Clear, nonlabored, tachypneic  CV: RRR, no MRG, trace edema GI: Soft, NT, ND, +BS  Neuro: Alert and oriented. No new focal deficits. Ext: Warm, no deformities. Skin: No rashes, lesions or ulcers on visualized skin   Assessment & Plan: Acute, recurrent hypoxemic respiratory failure: Due to PE > AECOPD, lowered pulmonary reserve.  - Continue supplemental oxygen for goal SpO2 >90% and normal work of breathing. Will need ambulatory pulse oximetry prior to discharge.  - Treat conditions as below  Bilateral PE's:  - Continue heparin  gtt, level supratherapeutic, managed per pharmacy. Given inconsistent DOAC use, do not feel this represents treatment failure, plan to restart once  hemodynamics and hgb stable (currently down 12 > 10), recheck in AM.  - Echocardiogram with limited RV views, though IVC appears ok. LVEF 65-70%.  - Given severity of clot burden/submassive/intermediate risk features, but relative mild presentation, suspect subacute PE's. Will check V/Q likely after discharge (unavailable currently) and follow up with pulmonary/cardiology for further work up of CTEPH.   Left popliteal vein DVT: Nonocclusive.  - Anticoagulation as above.   AECOPD:  - Continue steroids. Given no wheezing on exam, will deescalate to prednisone   - Continue scheduled and prn bronchodilators - Will plan 5 days abx, though PCT reassuring, no infiltrate, WBC likely related to PE - Completed tamiflu  for recent influenza, no further isolation/precautions/treatments needed for this.    Dyspepsia:  - Needs GI evaluation, though symptoms improving enough to defer to outpatient setting when more stable.  - Continue PPI, prn tums.   PAF with RVR: Precipitated by conditions above.  - Remains in AFib but rate has been < 100 this morning. Can give diltiazem  prn, attempting to avoid beta blocker with bronchospasm. If rate control becomes difficult, could also change albuterol  to xopenex.  - Continue amiodarone  (may not be best long term option for her with age and lung disease). LFTs ok, TSH is low, will send free T4.  - On anticoagulation as above  UTI: Suspected from pyuria + dysuria. No bacteria on micro though.  - Urine culture pending - On CTX regardless.   Generalized weakness: No focal deficits. Deconditioning from recent flu/admission, poor po intake.  - PT/OT (HH-PT/OT arranged at recent discharge), may be delayed in assessment for 24 hours per  their protocol.  History of CVA, HLD: Recent MRI showed remote right cerebellar infarct.   - Continue anticoagulation as above and statin.   History of breast CA s/p lumpectomy, chemo, radiation: UTD on mammograms per pt with no  recurrence.   Diana KATHEE Come, MD [facility] [email or web address] 06/05/2024, 3:06 PM

## 2024-06-05 NOTE — Progress Notes (Signed)
 Pt arrived from ICU via WC to room 315. Pt ambulatory with standby assist from chair to bed, tolerated well, no c/o SOB or pain at this time. Oriented to room and safety procedures, bed alarm on for safety and call bell within reach, advised to call for needs. Pt states understanding. Telemetry on, O2 @ 2lpm [place] on, IVF infusing without s/s infiltration.

## 2024-06-05 NOTE — Progress Notes (Signed)
 PHARMACY - ANTICOAGULATION CONSULT NOTE  Pharmacy Consult for IV heparin   Indication: acute PE  Allergies[1]  Patient Measurements: Height: 5' 8 (172.7 cm) Weight: 76.4 kg (168 lb 6.9 oz) IBW/kg (Calculated) : 63.9 HEPARIN  DW (KG): 76.4  Vital Signs: Temp: 97.7 F (36.5 C) (01/04 0313) Temp Source: Axillary (01/04 0313) BP: 116/45 (01/04 0700) Pulse Rate: 58 (01/04 0700)  Labs: Recent Labs    06/03/24 2250 06/04/24 0439 06/04/24 1406 06/04/24 2358 06/05/24 0452 06/05/24 0849  HGB 13.1 12.3  --   --  10.7*  --   HCT 40.1 38.2  --   --  33.0*  --   PLT 270 245  --   --  227  --   APTT  --   --  43* 58*  --  90*  LABPROT 14.8  --   --   --   --   --   INR 1.1  --   --   --   --   --   HEPARINUNFRC  --   --  >1.10*  --  0.83*  --   CREATININE 0.70 0.55  --   --  0.59  --     Estimated Creatinine Clearance: 56.6 mL/min (by C-G formula based on SCr of 0.59 mg/dL).  Assessment: 79 yo female with extensive PE.  Reportedly on Eliquis  PTA.  Given extent of clot, will start IV heparin  now.  Discussed with Dr. Lawence, just had Eliquis  dose at 345 AM but given suspicion for Eliquis  failure will start heparin  drip now.  aPTT 43>58> 90 , therapeutic No issues with infusion or signs/symptoms of bleeding per RN HL > 1.1 > 0.83   Goal of Therapy:  Heparin  level 0.3-0.7 units/ml APTT 66-102 Monitor platelets by anticoagulation protocol: Yes   Plan:  Continue Heparin  infusion at 1150 units/hr Check aPTT in 8 hrs Monitor with aPTTs until correlates with heparin  level Daily heparin  level, aPTT and CBC. F/U plan  Cherlyn Boers, BS Pharm D, BCPS Clinical Pharmacist 06/05/2024 9:30 AM        [1] No Known Allergies

## 2024-06-05 NOTE — Progress Notes (Signed)
 PHARMACY - ANTICOAGULATION CONSULT NOTE  Pharmacy Consult for IV heparin   Indication: acute PE  Allergies[1]  Patient Measurements: Height: 5' 8 (172.7 cm) Weight: 76.4 kg (168 lb 6.9 oz) IBW/kg (Calculated) : 63.9 HEPARIN  DW (KG): 76.4  Vital Signs: Temp: 97.6 F (36.4 C) (01/04 0020) Temp Source: Oral (01/04 0020) BP: 151/39 (01/04 0020) Pulse Rate: 68 (01/04 0020)  Labs: Recent Labs    06/03/24 2250 06/04/24 0439 06/04/24 1406 06/04/24 2358  HGB 13.1 12.3  --   --   HCT 40.1 38.2  --   --   PLT 270 245  --   --   APTT  --   --  43* 58*  LABPROT 14.8  --   --   --   INR 1.1  --   --   --   HEPARINUNFRC  --   --  >1.10*  --   CREATININE 0.70 0.55  --   --     Estimated Creatinine Clearance: 56.6 mL/min (by C-G formula based on SCr of 0.55 mg/dL).  Assessment: 79 yo female with extensive PE.  Reportedly on Eliquis  PTA.  Given extent of clot, will start IV heparin  now.  Discussed with Dr. Lawence, just had Eliquis  dose at 345 AM but given suspicion for Eliquis  failure will start heparin  drip now.  aPTT 43>58, subtherapeutic No issues with infusion or signs/symptoms of bleeding per RN   Goal of Therapy:  Heparin  level 0.3-0.7 units/ml APTT 66-102 Monitor platelets by anticoagulation protocol: Yes   Plan:  Increase Heparin  infusion to 1150 units/hr Check aPTT in 8 hrs Monitor with aPTTs until correlates with heparin  level Daily heparin  level, aPTT and CBC.  Lynwood Poplar, PharmD, BCPS Clinical Pharmacist 06/05/2024 12:42 AM       [1] No Known Allergies

## 2024-06-05 NOTE — Progress Notes (Signed)
 PHARMACY - ANTICOAGULATION CONSULT NOTE  Pharmacy Consult for IV heparin   Indication: acute PE  Allergies[1]  Patient Measurements: Height: 5' 8 (172.7 cm) Weight: 76.4 kg (168 lb 6.9 oz) IBW/kg (Calculated) : 63.9 HEPARIN  DW (KG): 76.4  Vital Signs: Temp: 97.5 F (36.4 C) (01/04 1636) Temp Source: Oral (01/04 1636) BP: 175/68 (01/04 1636) Pulse Rate: 76 (01/04 1636)  Labs: Recent Labs    06/03/24 2250 06/04/24 0439 06/04/24 1406 06/04/24 1406 06/04/24 2358 06/05/24 0452 06/05/24 0849 06/05/24 1658  HGB 13.1 12.3  --   --   --  10.7*  --   --   HCT 40.1 38.2  --   --   --  33.0*  --   --   PLT 270 245  --   --   --  227  --   --   APTT  --   --  43*   < > 58*  --  90* 82*  LABPROT 14.8  --   --   --   --   --   --   --   INR 1.1  --   --   --   --   --   --   --   HEPARINUNFRC  --   --  >1.10*  --   --  0.83*  --   --   CREATININE 0.70 0.55  --   --   --  0.59  --   --    < > = values in this interval not displayed.    Estimated Creatinine Clearance: 56.6 mL/min (by C-G formula based on SCr of 0.59 mg/dL).  Assessment: 79 yo female with extensive PE.  Reportedly on Eliquis  PTA.  Given extent of clot, will start IV heparin  now.  Discussed with Dr. Lawence, just had Eliquis  dose at 345 AM but given suspicion for Eliquis  failure will start heparin  drip now.  aPTT 43>58> 90 > 82, therapeutic No issues with infusion or signs/symptoms of bleeding per RN HL > 1.1 > 0.83   Goal of Therapy:  Heparin  level 0.3-0.7 units/ml APTT 66-102 Monitor platelets by anticoagulation protocol: Yes   Plan:  aPTT remains therapeutic x2 Continue heparin  infusion at 1150 units/hour Check aPTT and HL in AM Continue to monitor with aPTT levels until correlating with heparin  levels Monitor CBC and signs/symptoms of bleeding  Thank you for involving pharmacy in this patient's care.   Damien Napoleon, PharmD Clinical Pharmacist 06/05/2024 5:30 PM    [1] No Known Allergies

## 2024-06-05 NOTE — Plan of Care (Signed)

## 2024-06-06 DIAGNOSIS — J9601 Acute respiratory failure with hypoxia: Secondary | ICD-10-CM | POA: Diagnosis not present

## 2024-06-06 LAB — CBC
HCT: 32.1 % — ABNORMAL LOW (ref 36.0–46.0)
Hemoglobin: 10.3 g/dL — ABNORMAL LOW (ref 12.0–15.0)
MCH: 30.7 pg (ref 26.0–34.0)
MCHC: 32.1 g/dL (ref 30.0–36.0)
MCV: 95.5 fL (ref 80.0–100.0)
Platelets: 268 K/uL (ref 150–400)
RBC: 3.36 MIL/uL — ABNORMAL LOW (ref 3.87–5.11)
RDW: 13.4 % (ref 11.5–15.5)
WBC: 13.3 K/uL — ABNORMAL HIGH (ref 4.0–10.5)
nRBC: 0 % (ref 0.0–0.2)

## 2024-06-06 LAB — APTT: aPTT: 160 s — ABNORMAL HIGH (ref 24–36)

## 2024-06-06 LAB — HEPARIN LEVEL (UNFRACTIONATED): Heparin Unfractionated: >1.1 [IU]/mL — ABNORMAL HIGH (ref 0.30–0.70)

## 2024-06-06 MED ORDER — IPRATROPIUM-ALBUTEROL 0.5-2.5 (3) MG/3ML IN SOLN
3.0000 mL | Freq: Four times a day (QID) | RESPIRATORY_TRACT | Status: DC
  Administered 2024-06-06: 3 mL via RESPIRATORY_TRACT
  Filled 2024-06-06 (×2): qty 3

## 2024-06-06 MED ORDER — ACETAMINOPHEN 325 MG PO TABS
650.0000 mg | ORAL_TABLET | Freq: Four times a day (QID) | ORAL | Status: DC | PRN
  Administered 2024-06-06: 650 mg via ORAL
  Filled 2024-06-06: qty 2

## 2024-06-06 MED ORDER — IPRATROPIUM-ALBUTEROL 0.5-2.5 (3) MG/3ML IN SOLN
3.0000 mL | Freq: Two times a day (BID) | RESPIRATORY_TRACT | Status: DC
  Administered 2024-06-07 – 2024-06-08 (×3): 3 mL via RESPIRATORY_TRACT
  Filled 2024-06-06 (×3): qty 3

## 2024-06-06 MED ORDER — APIXABAN 5 MG PO TABS: 5.0000 mg | ORAL_TABLET | Freq: Two times a day (BID) | ORAL | Status: DC

## 2024-06-06 MED ORDER — APIXABAN 5 MG PO TABS
10.0000 mg | ORAL_TABLET | Freq: Two times a day (BID) | ORAL | Status: DC
  Administered 2024-06-06 – 2024-06-08 (×5): 10 mg via ORAL
  Filled 2024-06-06 (×5): qty 2

## 2024-06-06 MED ORDER — PANTOPRAZOLE SODIUM 40 MG PO TBEC
40.0000 mg | DELAYED_RELEASE_TABLET | Freq: Two times a day (BID) | ORAL | Status: DC
  Administered 2024-06-07 – 2024-06-08 (×3): 40 mg via ORAL
  Filled 2024-06-06 (×4): qty 1

## 2024-06-06 NOTE — Plan of Care (Signed)
" °  Problem: Acute Rehab PT Goals(only PT should resolve) Goal: Pt Will Go Supine/Side To Sit Outcome: Progressing Flowsheets (Taken 06/06/2024 1555) Pt will go Supine/Side to Sit: with minimal assist Goal: Patient Will Transfer Sit To/From Stand Outcome: Progressing Flowsheets (Taken 06/06/2024 1555) Patient will transfer sit to/from stand: with minimal assist Goal: Pt Will Transfer Bed To Chair/Chair To Bed Outcome: Progressing Flowsheets (Taken 06/06/2024 1555) Pt will Transfer Bed to Chair/Chair to Bed: with min assist Goal: Pt Will Ambulate Outcome: Progressing Flowsheets (Taken 06/06/2024 1555) Pt will Ambulate:  25 feet  with minimal assist  with rolling walker   3:55 PM, 06/06/2024 Lynwood Music, MPT Physical Therapist with [facility] [phone] office [phone] mobile phone  "

## 2024-06-06 NOTE — TOC Initial Note (Signed)
 Transition of Care [facility]) - Initial/Assessment Note    Patient Details  Name: [name] MRN: [id number] Date of Birth: 1945-08-08  Transition of Care [facility]) CM/SW Contact:    Lucie Lunger, LCSWA Phone Number: 06/06/2024, 12:07 PM  Clinical Narrative:                 CSW updated that PT is recommending SNF for pt at D/C. CSW met with pt at bedside to complete assessment. Pt states that she lives with her spouse. Pt has a walker in the home. Pt states that she has had HH before and did not find it very beneficial. Pt states she has been to SNF at [facility] and does not wish to return. Pt states she has heard good things about [facility] and would like referral sent there only. If [facility] cannot offer pt would like to talk more about OP PT being arranged. CSW to send out SNF referral. TOC to follow.   Expected Discharge Plan: Skilled Nursing Facility Barriers to Discharge: Continued Medical Work up   Patient Goals and CMS Choice Patient states their goals for this hospitalization and ongoing recovery are:: get stronger CMS Medicare.gov Compare Post Acute Care list provided to:: Patient Choice offered to / list presented to : Patient [place] ownership interest in [facility].provided to:: Patient    Expected Discharge Plan and Services In-house Referral: Clinical Social Work Discharge Planning Services: CM Consult Post Acute Care Choice: Skilled Nursing Facility Living arrangements for the past 2 months: Single Family Home                                      Prior Living Arrangements/Services Living arrangements for the past 2 months: Single Family Home Lives with:: Spouse Patient language and need for interpreter reviewed:: Yes Do you feel safe going back to the place where you live?: Yes      Need for Family Participation in Patient Care: Yes (Comment) Care giver support system in place?: Yes (comment) Current home services: DME (walker) Criminal Activity/Legal  Involvement Pertinent to Current Situation/Hospitalization: No - Comment as needed  Activities of Daily Living   ADL Screening (condition at time of admission) Independently performs ADLs?: No Does the patient have a NEW difficulty with bathing/dressing/toileting/self-feeding that is expected to last >3 days?: Yes (Initiates electronic notice to provider for possible OT consult) Does the patient have a NEW difficulty with getting in/out of bed, walking, or climbing stairs that is expected to last >3 days?: Yes (Initiates electronic notice to provider for possible PT consult) Does the patient have a NEW difficulty with communication that is expected to last >3 days?: No Is the patient deaf or have difficulty hearing?: No Does the patient have difficulty seeing, even when wearing glasses/contacts?: No Does the patient have difficulty concentrating, remembering, or making decisions?: No  Permission Sought/Granted                  Emotional Assessment Appearance:: Appears stated age Attitude/Demeanor/Rapport: Engaged Affect (typically observed): Accepting Orientation: : Oriented to Self, Oriented to Place, Oriented to  Time, Oriented to Situation Alcohol  / Substance Use: Not Applicable Psych Involvement: No (comment)  Admission diagnosis:  COPD exacerbation (HCC) [J44.1] Patient Active Problem List   Diagnosis Date Noted   COPD exacerbation (HCC) 06/04/2024   Acute respiratory failure with hypoxia (HCC) 06/04/2024   Paroxysmal atrial fibrillation with RVR (  HCC) 06/04/2024   Dyslipidemia 06/04/2024   Acute hypoxic respiratory failure (HCC) 05/22/2024   Influenza A 05/22/2024   Closed subcapital fracture of left femur (HCC) 04/23/2022   Left displaced femoral neck fracture (HCC) 04/22/2022   Fall at home, initial encounter 04/22/2022   History of pulmonary embolus (PE) 04/22/2022   Chronic anticoagulation 04/22/2022   NPH (normal pressure hydrocephalus) (HCC) 11/07/2021    Abnormality of gait due to impairment of balance 11/07/2021   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 06/15/2021   Asymptomatic bilateral carotid artery stenosis 05/17/2021   Ectopic atrial rhythm 05/17/2021   Syncope and collapse 05/17/2021   H/O: CVA (cerebrovascular accident) 11/01/2019   Cognitive deficits 09/26/2019   Gait instability 09/26/2019   Bleeding from varicose vein 01/05/2019   Dizziness 01/03/2018   DDD (degenerative disc disease), lumbar 08/31/2017   Generalized OA 08/31/2017   Venous hypertension of both lower extremities 07/03/2017   Osteopenia 05/13/2016   History of left breast cancer 05/13/2016   Glaucoma 05/13/2016   Hyperlipidemia 05/13/2016   Urinary incontinence 05/13/2016   Tobacco use disorder 05/13/2016   GERD (gastroesophageal reflux disease) 06/27/2013   H/O: upper GI bleed 06/27/2013   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HCC) 11/21/2010   PCP:  Trudy Vaughn FALCON, MD Pharmacy:   [facility] - [place], [place] - [address] [place] [address] Phone: [phone] Fax: [phone]  [facility] - [place], [place] - [address] [place] [address] Phone: [phone] Fax: [phone]     Social Drivers of Health (SDOH) Social History: SDOH Screenings   Food Insecurity: No Food Insecurity (06/04/2024)  Housing: Low Risk (06/04/2024)  Transportation Needs: No Transportation Needs (06/04/2024)  Utilities: Not At Risk (06/04/2024)  Social Connections: Unknown (06/04/2024)  Recent Concern: Social Connections - Socially Isolated (05/22/2024)  Tobacco Use: High Risk (06/03/2024)   SDOH Interventions:     Readmission Risk Interventions    06/06/2024   12:06 PM  Readmission Risk Prevention Plan  Transportation Screening Complete  Home Care Screening Complete  Medication Review (RN CM) Complete

## 2024-06-06 NOTE — Plan of Care (Signed)
" °  Problem: Acute Rehab OT Goals (only OT should resolve) Goal: Pt. Will Perform Grooming Flowsheets (Taken 06/06/2024 1502) Pt Will Perform Grooming:  with min assist  with contact guard assist  standing Goal: Pt. Will Perform Upper Body Bathing Flowsheets (Taken 06/06/2024 1502) Pt Will Perform Upper Body Bathing:  with modified independence  sitting Goal: Pt. Will Perform Lower Body Bathing Flowsheets (Taken 06/06/2024 1502) Pt Will Perform Lower Body Bathing:  with supervision  with contact guard assist  sitting/lateral leans  with adaptive equipment Goal: Pt. Will Perform Upper Body Dressing Flowsheets (Taken 06/06/2024 1502) Pt Will Perform Upper Body Dressing: with modified independence Goal: Pt. Will Perform Lower Body Dressing Flowsheets (Taken 06/06/2024 1502) Pt Will Perform Lower Body Dressing:  with contact guard assist  with supervision  with adaptive equipment  sitting/lateral leans Goal: Pt. Will Transfer To Toilet Flowsheets (Taken 06/06/2024 1502) Pt Will Transfer to Toilet:  with supervision  with contact guard assist  stand pivot transfer Goal: Pt. Will Perform Toileting-Clothing Manipulation Flowsheets (Taken 06/06/2024 1502) Pt Will Perform Toileting - Clothing Manipulation and hygiene: with contact guard assist Goal: Pt/Caregiver Will Perform Home Exercise Program Flowsheets (Taken 06/06/2024 1502) Pt/caregiver will Perform Home Exercise Program:  Increased strength  Increased ROM  Both right and left upper extremity  Independently  Anderia Lorenzo OT, MOT  "

## 2024-06-06 NOTE — Progress Notes (Signed)
 PHARMACY - ANTICOAGULATION CONSULT NOTE  Pharmacy Consult for IV heparin  => apixaban  Indication: acute PE  Allergies[1]  Patient Measurements: Height: 5' 8 (172.7 cm) Weight: 76.4 kg (168 lb 6.9 oz) IBW/kg (Calculated) : 63.9 HEPARIN  DW (KG): 76.4  Vital Signs: Temp: 97.7 F (36.5 C) (01/05 0455) Temp Source: Axillary (01/05 0455) BP: 148/64 (01/05 0455) Pulse Rate: 65 (01/05 0455)  Labs: Recent Labs    06/03/24 2250 06/04/24 0439 06/04/24 1406 06/04/24 2358 06/05/24 0452 06/05/24 0849 06/05/24 1658 06/06/24 0440  HGB 13.1 12.3  --   --  10.7*  --   --  10.3*  HCT 40.1 38.2  --   --  33.0*  --   --  32.1*  PLT 270 245  --   --  227  --   --  268  APTT  --   --  43*   < >  --  90* 82* 160*  LABPROT 14.8  --   --   --   --   --   --   --   INR 1.1  --   --   --   --   --   --   --   HEPARINUNFRC  --   --  >1.10*  --  0.83*  --   --  >1.10*  CREATININE 0.70 0.55  --   --  0.59  --   --   --    < > = values in this interval not displayed.    Estimated Creatinine Clearance: 56.6 mL/min (by C-G formula based on SCr of 0.59 mg/dL).  Assessment: 79 yo female with extensive PE.  Reportedly on Eliquis  PTA.  Given extent of clot, will start IV heparin  now.  Discussed with Dr. Lawence, just had Eliquis  dose at 345 AM but given suspicion for Eliquis  failure will start heparin  drip now.  With inconsistency with compliance to Eliquis  over the last 3 weeks now still planning to Transition to eliquis  for PE. Will re-load for 1 week/   aPTT 43>58> 90 > 82, therapeutic No issues with infusion or signs/symptoms of bleeding per RN HL > 1.1 > 0.83   Goal of Therapy:  Heparin  level 0.3-0.7 units/ml APTT 66-102 Monitor platelets by anticoagulation protocol: Yes   Plan:  D/C heparin   Eliquis  10mg  po bid x 7 days, then 5mg  po bid Monitor CBC and signs/symptoms of bleeding  Thank you for involving pharmacy in this patient's care.   Delphine Sizemore, BS Pharm D, BCPS Clinical  Pharmacist 06/06/2024 7:30 AM     [1] No Known Allergies

## 2024-06-06 NOTE — Progress Notes (Signed)
 [facility] PROGRESS NOTE  [name] (DOB: 02-12-1946) FMW:[id number] PCP: Trudy Vaughn FALCON, MD  Brief Narrative: [name] is an 79 year old woman with COPD, PAF and prior PE with inconsistent use of DOAC, recent admission for influenza A (discharged 12/22) who presented on 1/2 with persistent cough, weakness, and poor oral intake, culminating in functional collapse at home with inability to rise from the toilet. On arrival she was tachycardic (AFib w/RVR), hypoxemic requiring 2 L O2, work up revealing neutrophilic leukocytosis (WBC 13.8k), metabolic acidosis (CO? 20, AG 16) but normal lactate. CXR showed chronic emphysematous and fibrotic changes with bibasilar atelectasis and no focal consolidation. Subsequent CTA revealed extensive bilateral PE's with near-occlusive clot burden in multiple lobar branches. Mild RV/RA dilatation. Heparin  started, echo ordered, and she was admitted to SDU earlier this morning.   Subjective: Speaking with pastor at bedside, she's up in chair, feels weak but breathing normally, no chest pain.   Objective: BP (!) 170/67 (BP Location: Right Arm)   Pulse 89   Temp 97.7 F (36.5 C) (Oral)   Resp 18   Ht 5' 8 (1.727 m)   Wt 76.4 kg   SpO2 91%   BMI 25.61 kg/m   No distress Clear, nonlabored Irreg irreg, no new MRG, no RV heave, no significant LE edema Alert, oriented, nonfocal but diffusely weak Soft, NT, ND, +BS  Assessment & Plan: Acute, recurrent hypoxemic respiratory failure: Due to PE > AECOPD, lowered pulmonary reserve.  - Continue supplemental oxygen for goal SpO2 >90% and normal work of breathing. No longer hypoxemic. Stable for discharge once appropriate venue can be secured. - Treat conditions as below  Bilateral PE's:  - Convert to eliquis , no bleeding, hgb stable in 10's. Given inconsistent DOAC use, do not feel this represents treatment failure. - Echocardiogram with limited RV views, though IVC appears ok. LVEF 65-70%.  -  Given severity of clot burden/submassive/intermediate risk features, but relative mild presentation, suspect subacute PE's. Will check V/Q likely after discharge (unavailable currently) and follow up with pulmonary/cardiology for further work up of CTEPH.   Left popliteal vein DVT: Nonocclusive.  - Anticoagulation as above.   AECOPD:  - Continue steroids, total of 5 days to be finished with prednisone   - Continue scheduled and prn bronchodilators - Will plan 5 days abx, though PCT reassuring, no infiltrate, WBC likely related to PE. Can convert to po - Completed tamiflu  for recent influenza, no further isolation/precautions/treatments needed for this.    Dyspepsia:  - Needs GI evaluation, though symptoms improving enough to defer to outpatient setting when more stable.  - Continue PPI, prn tums.   PAF with RVR: Precipitated by conditions above.  - Remains in AFib but rate has been < 100 this morning. Can give diltiazem  prn, attempting to avoid beta blocker with bronchospasm. If rate control becomes difficult, could also change albuterol  to xopenex.  - Continue amiodarone  (may not be best long term option for her with age and lung disease). LFTs ok, TSH is low, will send free T4 (pending)  - On anticoagulation as above  UTI ruled out: Suspected from pyuria + dysuria. No bacteria on micro though.  - Urine culture negative  Generalized weakness: No focal deficits. Deconditioning from recent flu/admission, poor po intake.  - PT/OT recommends SNF which seems appropriate. CSW working diligently on this. She is stable medically to be discharged from the hospital at this time.   History of CVA, HLD: Recent MRI showed remote right cerebellar infarct.   -  Continue anticoagulation as above and statin.   History of breast CA s/p lumpectomy, chemo, radiation: UTD on mammograms per pt with no recurrence.   Diana KATHEE Come, MD [facility] [email or web address] 06/06/2024, 4:44 PM

## 2024-06-06 NOTE — Evaluation (Signed)
 Occupational Therapy Evaluation Patient Details Name: [name] MRN: [id number] DOB: 1945/06/24 Today's Date: 06/06/2024   History of Present Illness   [name] is a 79 y.o. Caucasian female with medical history significant for osteoarthritis, COPD, DDD, diverticulitis, paroxysmal atrial fibrillation, GERD, dyslipidemia, pulmonary embolism with inconsistency with compliance to Eliquis  over the last 3 weeks, who was recently admitted here for influenza A and acute respiratory failure with hypoxia and discharged on 12/22, and presents tonight with significant generalized weakness with inability to get up from her toilet.  She stated that her cough has not improved over the last 3 weeks.  She did not have any significantly worsening dyspnea and denied wheezing.  She is not on oxygen at home.  She admitted to nausea with significant subsequent anorexia.  She denied any vomiting or diarrhea or abdominal pain.  No bleeding diathesis.  No chest pain or palpitations.  She admitted to dysuria yesterday without urinary frequency or urgency or flank pain.  During my interview Pulsoxymeter was 90 to 91% on 2 L of O2 by nasal cannula. (per MD)     Clinical Impressions Pt agreeable to OT and PT co-evaluation. Pt limited by pain initially this AM but tolerated the evaluation later in the morning. Pt required mod A for bed mobility to sit at EOB from supine. Mod to max A needed for EOB to chair transfer with RW. Pt reports much difficulty with lower body ADL's at baseline. Today pt required max to total assist for lower body dressing to don socks. Pt demonstrates limited reach to B LE while seated. B UE generally weak with mild shoulder AROM limitations. Pt left in the chair with call bell within reach. Pt will benefit from continued OT in the hospital to increase strength, balance, and endurance for safe ADL's.      If plan is discharge home, recommend the following:   A lot of help with walking and/or  transfers;A lot of help with bathing/dressing/bathroom;Assistance with cooking/housework;Assist for transportation;Help with stairs or ramp for entrance     Functional Status Assessment   Patient has had a recent decline in their functional status and demonstrates the ability to make significant improvements in function in a reasonable and predictable amount of time.     Equipment Recommendations   None recommended by OT             Precautions/Restrictions   Precautions Precautions: Fall Recall of Precautions/Restrictions: Intact Restrictions Weight Bearing Restrictions Per Provider Order: No     Mobility Bed Mobility Overal bed mobility: Needs Assistance Bed Mobility: Supine to Sit     Supine to sit: Mod assist     General bed mobility comments: labored movement; assist for trunk control; education on log roll technique due to back pain.    Transfers Overall transfer level: Needs assistance Equipment used: Rolling walker (2 wheels) Transfers: Sit to/from Stand, Bed to chair/wheelchair/BSC Sit to Stand: Mod assist, Max assist     Step pivot transfers: Mod assist, Max assist     General transfer comment: EOB to chair with RW; unsteady/labored movement.      Balance Overall balance assessment: Needs assistance Sitting-balance support: No upper extremity supported, Feet supported Sitting balance-Leahy Scale: Fair Sitting balance - Comments: seated at EOB   Standing balance support: Bilateral upper extremity supported, During functional activity, Reliant on assistive device for balance Standing balance-Leahy Scale: Poor Standing balance comment: using RW  ADL either performed or assessed with clinical judgement   ADL Overall ADL's : Needs assistance/impaired     Grooming: Set up;Sitting   Upper Body Bathing: Set up;Sitting   Lower Body Bathing: Maximal assistance;Sitting/lateral leans;Moderate assistance    Upper Body Dressing : Set up;Sitting   Lower Body Dressing: Total assistance;Maximal assistance;Sitting/lateral leans Lower Body Dressing Details (indicate cue type and reason): Required assist to don bilateral socks seated at EOB. Toilet Transfer: Moderate assistance;Maximal assistance;Stand-pivot;Rolling walker (2 wheels) Toilet Transfer Details (indicate cue type and reason): EOB to chair with RW Toileting- Clothing Manipulation and Hygiene: Moderate assistance;Minimal assistance;Sitting/lateral lean       Functional mobility during ADLs: Moderate assistance;Maximal assistance;Rolling walker (2 wheels)       Vision Baseline Vision/History: 1 Wears glasses Ability to See in Adequate Light: 0 Adequate Patient Visual Report: No change from baseline Vision Assessment?: No apparent visual deficits Additional Comments: Outside of baseline deficits.     Perception Perception: Not tested       Praxis Praxis: Not tested       Pertinent Vitals/Pain Pain Assessment Pain Assessment: Faces Faces Pain Scale: Hurts little more Pain Location: back Pain Descriptors / Indicators: Discomfort Pain Intervention(s): Limited activity within patient's tolerance, Monitored during session, Repositioned     Extremity/Trunk Assessment Upper Extremity Assessment Upper Extremity Assessment: Generalized weakness (3-/5 shoulder flexion bilaterally with near full P/ROM. Generally weak below shoulders.)   Lower Extremity Assessment Lower Extremity Assessment: Defer to PT evaluation   Cervical / Trunk Assessment Cervical / Trunk Assessment: Normal   Communication Communication Communication: No apparent difficulties   Cognition Arousal: Alert Behavior During Therapy: WFL for tasks assessed/performed Cognition: No apparent impairments                               Following commands: Intact       Cueing  General Comments   Cueing Techniques: Verbal cues;Tactile cues  On  room air pt was saturating >90% SpO2. Pt left on room air.              Home Living Family/patient expects to be discharged to:: Private residence Living Arrangements: Spouse/significant other Available Help at Discharge: Family;Available PRN/intermittently Type of Home: House Home Access: Stairs to enter [employer] of Steps: 4 Entrance Stairs-Rails: Left Home Layout: Two level;Able to live on main level with bedroom/bathroom;Full bath on main level Alternate Level Stairs-Number of Steps: 9-10 steps Alternate Level Stairs-Rails: Right Bathroom Shower/Tub: Tub/shower unit;Walk-in shower   Bathroom Toilet: Handicapped height Bathroom Accessibility: Yes How Accessible: Accessible via wheelchair;Accessible via walker Home Equipment: Rolling Walker (2 wheels);Rollator (4 wheels);Grab bars - tub/shower;Shower seat          Prior Functioning/Environment Prior Level of Function : Needs assist       Physical Assist : ADLs (physical);Mobility (physical) Mobility (physical): Bed mobility;Transfers;Gait;Stairs ADLs (physical): IADLs;Bathing;Dressing Mobility Comments: household ambulation using RW ADLs Comments: Pt is not assisted for ADL's but reports she basically does not wash below her knees and walks around with bare feet because she struggles so much with lower body ADL tasks.    OT Problem List: Decreased strength;Decreased range of motion;Decreased activity tolerance;Impaired balance (sitting and/or standing);Pain   OT Treatment/Interventions: Self-care/ADL training;Therapeutic exercise;Energy conservation;DME and/or AE instruction;Therapeutic activities;Patient/family education;Balance training      OT Goals(Current goals can be found in the care plan section)   Acute Rehab OT Goals Patient Stated Goal: Return home. OT Goal  Formulation: With patient Time For Goal Achievement: 06/20/24 Potential to Achieve Goals: Good   OT Frequency:  Min 2X/week     Co-evaluation PT/OT/SLP Co-Evaluation/Treatment: Yes Reason for Co-Treatment: To address functional/ADL transfers   OT goals addressed during session: ADL's and self-care                       End of Session Equipment Utilized During Treatment: Rolling walker (2 wheels) Nurse Communication: Other (comment) (O2 status)  Activity Tolerance: Patient tolerated treatment well Patient left: in chair;with call bell/phone within reach  OT Visit Diagnosis: Unsteadiness on feet (R26.81);Other abnormalities of gait and mobility (R26.89);Muscle weakness (generalized) (M62.81)                Time: 8868-8847 OT Time Calculation (min): 21 min Charges:  OT General Charges $OT Visit: 1 Visit OT Evaluation $OT Eval Low Complexity: 1 Low  Narvel Kozub OT, MOT  Jayson Person 06/06/2024, 3:02 PM

## 2024-06-06 NOTE — Evaluation (Signed)
 Physical Therapy Evaluation Patient Details Name: [name] MRN: [id number] DOB: 05-29-1946 Today's Date: 06/06/2024  History of Present Illness  [name] is a 79 y.o. Caucasian female with medical history significant for osteoarthritis, COPD, DDD, diverticulitis, paroxysmal atrial fibrillation, GERD, dyslipidemia, pulmonary embolism with inconsistency with compliance to Eliquis  over the last 3 weeks, who was recently admitted here for influenza A and acute respiratory failure with hypoxia and discharged on 12/22, and presents tonight with significant generalized weakness with inability to get up from her toilet.  She stated that her cough has not improved over the last 3 weeks.  She did not have any significantly worsening dyspnea and denied wheezing.  She is not on oxygen at home.  She admitted to nausea with significant subsequent anorexia.  She denied any vomiting or diarrhea or abdominal pain.  No bleeding diathesis.  No chest pain or palpitations.  She admitted to dysuria yesterday without urinary frequency or urgency or flank pain.  During my interview Pulsoxymeter was 90 to 91% on 2 L of O2 by nasal cannula.   Clinical Impression  Patient demonstrates slow labored movement for rolling to side and sitting up from side lying position, had difficulty completing sit to stands from bedside due to weakness and limited to taking a few side steps at bedside before having to sit due to BLE weakness and fatigue. Patient will benefit from continued skilled physical therapy in hospital and recommended venue below to increase strength, balance, endurance for safe ADLs and gait.          If plan is discharge home, recommend the following: A lot of help with bathing/dressing/bathroom;A lot of help with walking and/or transfers;Help with stairs or ramp for entrance;Assistance with cooking/housework;Assist for transportation   Can travel by private vehicle   No    Equipment Recommendations None  recommended by PT  Recommendations for Other Services       Functional Status Assessment Patient has had a recent decline in their functional status and demonstrates the ability to make significant improvements in function in a reasonable and predictable amount of time.     Precautions / Restrictions Precautions Precautions: Fall Recall of Precautions/Restrictions: Intact Restrictions Weight Bearing Restrictions Per Provider Order: No      Mobility  Bed Mobility Overal bed mobility: Needs Assistance Bed Mobility: Rolling, Sidelying to Sit Rolling: Mod assist Sidelying to sit: Mod assist       General bed mobility comments: slow labored movement with fair/poor carryover for log rolling to side    Transfers Overall transfer level: Needs assistance Equipment used: Rolling walker (2 wheels) Transfers: Sit to/from Stand, Bed to chair/wheelchair/BSC Sit to Stand: Mod assist, Max assist   Step pivot transfers: Mod assist, Max assist       General transfer comment: unsteady labored movement    Ambulation/Gait Ambulation/Gait assistance: Mod assist, Max assist Gait Distance (Feet): 5 Feet Assistive device: Rolling walker (2 wheels) Gait Pattern/deviations: Decreased step length - right, Decreased step length - left, Decreased stride length, Trunk flexed, Knees buckling Gait velocity: slow     General Gait Details: limited to a few unsteady labored side steps before having to sit due to fatigue and poor standing balance  Stairs            Wheelchair Mobility     Tilt Bed    Modified Rankin (Stroke Patients Only)       Balance Overall balance assessment: Needs assistance Sitting-balance support: Feet supported, No upper  extremity supported Sitting balance-Leahy Scale: Fair Sitting balance - Comments: seated at EOB   Standing balance support: Bilateral upper extremity supported, During functional activity, Reliant on assistive device for balance Standing  balance-Leahy Scale: Poor Standing balance comment: using RW                             Pertinent Vitals/Pain Pain Assessment Pain Assessment: Faces Faces Pain Scale: Hurts little more Pain Location: back Pain Descriptors / Indicators: Discomfort Pain Intervention(s): Limited activity within patient's tolerance, Monitored during session, Repositioned    Home Living Family/patient expects to be discharged to:: Private residence Living Arrangements: Spouse/significant other Available Help at Discharge: Family;Available PRN/intermittently Type of Home: House Home Access: Stairs to enter Entrance Stairs-Rails: Left Entrance Stairs-Number of Steps: 4 Alternate Level Stairs-Number of Steps: 9-10 steps Home Layout: Two level;Able to live on main level with bedroom/bathroom;Full bath on main level Home Equipment: Rolling Walker (2 wheels);Rollator (4 wheels);Grab bars - tub/shower;Shower seat      Prior Function Prior Level of Function : Needs assist       Physical Assist : ADLs (physical);Mobility (physical) Mobility (physical): Bed mobility;Transfers;Gait;Stairs ADLs (physical): IADLs;Bathing;Dressing Mobility Comments: household ambulation using RW ADLs Comments: Pt is not assisted for ADL's but reports she basically does not wash below her knees and walks around with bare feet because she struggles so much with lower body ADL tasks.     Extremity/Trunk Assessment   Upper Extremity Assessment Upper Extremity Assessment: Defer to OT evaluation    Lower Extremity Assessment Lower Extremity Assessment: Generalized weakness    Cervical / Trunk Assessment Cervical / Trunk Assessment: Normal  Communication   Communication Communication: No apparent difficulties    Cognition Arousal: Alert Behavior During Therapy: WFL for tasks assessed/performed   PT - Cognitive impairments: No apparent impairments                         Following commands:  Intact       Cueing Cueing Techniques: Verbal cues, Tactile cues     General Comments General comments (skin integrity, edema, etc.): On room air pt was saturating >90% SpO2. Pt left on room air.    Exercises     Assessment/Plan    PT Assessment Patient needs continued PT services  PT Problem List Decreased strength;Decreased activity tolerance;Decreased mobility;Decreased balance;Pain       PT Treatment Interventions DME instruction;Gait training;Stair training;Functional mobility training;Therapeutic activities;Therapeutic exercise;Balance training;Patient/family education    PT Goals (Current goals can be found in the Care Plan section)  Acute Rehab PT Goals Patient Stated Goal: return home with family to assist PT Goal Formulation: With patient Time For Goal Achievement: 06/20/24 Potential to Achieve Goals: Good    Frequency Min 3X/week     Co-evaluation PT/OT/SLP Co-Evaluation/Treatment: Yes Reason for Co-Treatment: To address functional/ADL transfers PT goals addressed during session: Mobility/safety with mobility;Balance;Proper use of DME OT goals addressed during session: ADL's and self-care       AM-PAC PT 6 Clicks Mobility  Outcome Measure Help needed turning from your back to your side while in a flat bed without using bedrails?: A Lot Help needed moving from lying on your back to sitting on the side of a flat bed without using bedrails?: A Lot Help needed moving to and from a bed to a chair (including a wheelchair)?: A Lot Help needed standing up from a chair using your arms (e.g.,  wheelchair or bedside chair)?: A Lot Help needed to walk in hospital room?: A Lot Help needed climbing 3-5 steps with a railing? : Total 6 Click Score: 11    End of Session   Activity Tolerance: Patient tolerated treatment well;Patient limited by fatigue Patient left: in chair;with call bell/phone within reach Nurse Communication: Mobility status PT Visit Diagnosis:  Unsteadiness on feet (R26.81);Other abnormalities of gait and mobility (R26.89);Muscle weakness (generalized) (M62.81)    Time: 8872-8851 PT Time Calculation (min) (ACUTE ONLY): 21 min   Charges:   PT Evaluation $PT Eval Moderate Complexity: 1 Mod PT Treatments $Therapeutic Activity: 8-22 mins PT General Charges $$ ACUTE PT VISIT: 1 Visit         3:53 PM, 06/06/2024 Lynwood Music, MPT Physical Therapist with [facility] [phone] office [phone] mobile phone

## 2024-06-07 DIAGNOSIS — J9601 Acute respiratory failure with hypoxia: Secondary | ICD-10-CM | POA: Diagnosis not present

## 2024-06-07 LAB — T4, FREE: Free T4: 1.42 ng/dL (ref 0.80–2.00)

## 2024-06-07 LAB — HEMOGLOBIN AND HEMATOCRIT, BLOOD
HCT: 34.1 % — ABNORMAL LOW (ref 36.0–46.0)
Hemoglobin: 11.2 g/dL — ABNORMAL LOW (ref 12.0–15.0)

## 2024-06-07 NOTE — TOC Progression Note (Signed)
 Transition of Care [facility]) - Progression Note    Patient Details  Name: [name] MRN: [id number] Date of Birth: June 17, 1945  [facility]) CM/SW Contact  Lucie Lunger, [place] Phone Number: 06/07/2024, 9:25 AM  Clinical Narrative:    CSW spoke with pt and family who request SNF at [facility] if possible. CSW sent referral, bed offer made. CSW spoke with pt and family about be offer, pt accepts. Insurance shara has been started and is pending. TOC to follow.   Expected Discharge Plan: Skilled Nursing Facility Barriers to Discharge: Continued Medical Work up               Expected Discharge Plan and Services In-house Referral: Clinical Social Work Discharge Planning Services: CM Consult Post Acute Care Choice: Skilled Nursing Facility Living arrangements for the past 2 months: Single Family Home                                       Social Drivers of Health (SDOH) Interventions SDOH Screenings   Food Insecurity: No Food Insecurity (06/04/2024)  Housing: Low Risk (06/04/2024)  Transportation Needs: No Transportation Needs (06/04/2024)  Utilities: Not At Risk (06/04/2024)  Social Connections: Unknown (06/04/2024)  Recent Concern: Social Connections - Socially Isolated (05/22/2024)  Tobacco Use: High Risk (06/03/2024)    Readmission Risk Interventions    06/06/2024   12:06 PM  Readmission Risk Prevention Plan  Transportation Screening Complete  Home Care Screening Complete  Medication Review (RN CM) Complete

## 2024-06-07 NOTE — Care Management Important Message (Signed)
 Important Message  Patient Details  Name: [name] MRN: [id number] Date of Birth: Jan 10, 1946   Important Message Given:  Yes - Medicare IM     Oliana Gowens L Gianfranco Araki 06/07/2024, 2:05 PM

## 2024-06-07 NOTE — NC FL2 (Signed)
 " [place]  MEDICAID FL2 LEVEL OF CARE FORM     IDENTIFICATION  Patient Name: [name] Birthdate: September 20, 1945 Sex: female Admission Date (Current Location): 06/03/2024  [facility] and [place] Number:  [employer] and Address:  [facility],  [address] [place] [address]      Provider Number: [id number]  Attending Physician Name and Address:  Bryn Bernardino NOVAK, MD  Relative Name and Phone Number:       Current Level of Care: Hospital Recommended Level of Care: Skilled Nursing Facility Prior Approval Number:    Date Approved/Denied:   PASRR Number: [id number]  Discharge Plan: SNF    Current Diagnoses: Patient Active Problem List   Diagnosis Date Noted   COPD exacerbation (HCC) 06/04/2024   Acute respiratory failure with hypoxia (HCC) 06/04/2024   Paroxysmal atrial fibrillation with RVR (HCC) 06/04/2024   Dyslipidemia 06/04/2024   Acute hypoxic respiratory failure (HCC) 05/22/2024   Influenza A 05/22/2024   Closed subcapital fracture of left femur (HCC) 04/23/2022   Left displaced femoral neck fracture (HCC) 04/22/2022   Fall at home, initial encounter 04/22/2022   History of pulmonary embolus (PE) 04/22/2022   Chronic anticoagulation 04/22/2022   NPH (normal pressure hydrocephalus) (HCC) 11/07/2021   Abnormality of gait due to impairment of balance 11/07/2021   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 06/15/2021   Asymptomatic bilateral carotid artery stenosis 05/17/2021   Ectopic atrial rhythm 05/17/2021   Syncope and collapse 05/17/2021   H/O: CVA (cerebrovascular accident) 11/01/2019   Cognitive deficits 09/26/2019   Gait instability 09/26/2019   Bleeding from varicose vein 01/05/2019   Dizziness 01/03/2018   DDD (degenerative disc disease), lumbar 08/31/2017   Generalized OA 08/31/2017   Venous hypertension of both lower extremities 07/03/2017   Osteopenia 05/13/2016   History of left breast cancer 05/13/2016   Glaucoma 05/13/2016    Hyperlipidemia 05/13/2016   Urinary incontinence 05/13/2016   Tobacco use disorder 05/13/2016   GERD (gastroesophageal reflux disease) 06/27/2013   H/O: upper GI bleed 06/27/2013   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HCC) 11/21/2010    Orientation RESPIRATION BLADDER Height & Weight     Self, Time, Situation, Place  O2 (2L) Incontinent Weight: 168 lb 6.9 oz (76.4 kg) Height:  5' 8 (172.7 cm)  BEHAVIORAL SYMPTOMS/MOOD NEUROLOGICAL BOWEL NUTRITION STATUS      Continent Diet  AMBULATORY STATUS COMMUNICATION OF NEEDS Skin   Extensive Assist Verbally Normal                       Personal Care Assistance Level of Assistance  Bathing, Feeding, Dressing Bathing Assistance: Limited assistance Feeding assistance: Independent Dressing Assistance: Limited assistance     Functional Limitations Info  Sight, Hearing, Speech Sight Info: Impaired Hearing Info: Adequate Speech Info: Adequate    SPECIAL CARE FACTORS FREQUENCY  PT (By licensed PT), OT (By licensed OT)     PT Frequency: 5 times weekly OT Frequency: 5 times weekly            Contractures Contractures Info: Not present    Additional Factors Info  Code Status, Allergies Code Status Info: DNR-Limited Allergies Info: NKA           Current Medications (06/07/2024):  This is the current hospital active medication list Current Facility-Administered Medications  Medication Dose Route Frequency Provider Last Rate Last Admin   acetaminophen  (TYLENOL ) tablet 650 mg  650 mg Oral Q6H PRN Bryn Bernardino NOVAK, MD   650 mg at 06/06/24  9065   amiodarone  (PACERONE ) tablet 100 mg  100 mg Oral Daily Mansy, Jan A, MD   100 mg at 06/07/24 9167   apixaban  (ELIQUIS ) tablet 10 mg  10 mg Oral BID Bryn Bernardino NOVAK, MD   10 mg at 06/07/24 9167   Followed by   NOREEN ON 06/13/2024] apixaban  (ELIQUIS ) tablet 5 mg  5 mg Oral BID Bryn Bernardino NOVAK, MD       atorvastatin  (LIPITOR) tablet 20 mg  20 mg Oral Daily Mansy, Jan A, MD   20 mg at 06/07/24 9167    calcium  carbonate (TUMS - dosed in mg elemental calcium ) chewable tablet 200 mg of elemental calcium   1 tablet Oral TID PRN Bryn Bernardino NOVAK, MD   200 mg of elemental calcium  at 06/06/24 0936   cefTRIAXone  (ROCEPHIN ) 1 g in sodium chloride  0.9 % 100 mL IVPB  1 g Intravenous Q24H Mansy, Jan A, MD 200 mL/hr at 06/07/24 0330 1 g at 06/07/24 0330   chlorpheniramine-HYDROcodone  (TUSSIONEX) 10-8 MG/5ML suspension 5 mL  5 mL Oral Q12H PRN Mansy, Jan A, MD       guaiFENesin  (MUCINEX ) 12 hr tablet 600 mg  600 mg Oral BID Mansy, Jan A, MD   600 mg at 06/07/24 9167   ipratropium-albuterol  (DUONEB) 0.5-2.5 (3) MG/3ML nebulizer solution 3 mL  3 mL Nebulization Q4H PRN Mansy, Jan A, MD   3 mL at 06/06/24 1431   ipratropium-albuterol  (DUONEB) 0.5-2.5 (3) MG/3ML nebulizer solution 3 mL  3 mL Nebulization BID Bryn Bernardino B, MD   3 mL at 06/07/24 0803   magnesium  hydroxide (MILK OF MAGNESIA) suspension 30 mL  30 mL Oral Daily PRN Mansy, Jan A, MD       multivitamin (PROSIGHT) tablet 1 tablet  1 tablet Oral Daily Bryn Bernardino NOVAK, MD   1 tablet at 06/06/24 1239   multivitamin with minerals tablet 1 tablet  1 tablet Oral Daily Mansy, Jan A, MD   1 tablet at 06/07/24 0832   omega-3 acid ethyl esters (LOVAZA ) capsule 1 g  1 g Oral Daily Mansy, Jan A, MD   1 g at 06/07/24 9167   ondansetron  (ZOFRAN ) tablet 4 mg  4 mg Oral Q6H PRN Mansy, Jan A, MD       Or   ondansetron  (ZOFRAN ) injection 4 mg  4 mg Intravenous Q6H PRN Mansy, Jan A, MD       Oral care mouth rinse  15 mL Mouth Rinse PRN Mansy, Jan A, MD       pantoprazole  (PROTONIX ) EC tablet 40 mg  40 mg Oral BID AC Grunz, Ryan B, MD   40 mg at 06/07/24 9167   predniSONE  (DELTASONE ) tablet 40 mg  40 mg Oral Q breakfast Mansy, Jan A, MD   40 mg at 06/07/24 9167   saccharomyces boulardii (FLORASTOR) capsule 250 mg  250 mg Oral BID Mansy, Jan A, MD   250 mg at 06/07/24 9164   traZODone  (DESYREL ) tablet 25 mg  25 mg Oral QHS PRN Mansy, Jan A, MD   25 mg at 06/05/24 2128      Discharge Medications: Please see discharge summary for a list of discharge medications.  Relevant Imaging Results:  Relevant Lab Results:   Additional Information SSN: [id number] [place]     "

## 2024-06-07 NOTE — Plan of Care (Signed)
   Problem: Education: Goal: Knowledge of General Education information will improve Description: Including pain rating scale, medication(s)/side effects and non-pharmacologic comfort measures Outcome: Progressing   Problem: Nutrition: Goal: Adequate nutrition will be maintained Outcome: Progressing   Problem: Coping: Goal: Level of anxiety will decrease Outcome: Progressing

## 2024-06-07 NOTE — Progress Notes (Signed)
 [facility] PROGRESS NOTE  [name] (DOB: [birth year]) FMW:[id number] PCP: Diana Vaughn FALCON, MD  Brief Narrative: [name] is an 79 year old woman with COPD, PAF and prior PE with inconsistent use of DOAC, recent admission for influenza A (discharged 12/22) who presented on 1/2 with persistent cough, weakness, and poor oral intake, culminating in functional collapse at home with inability to rise from the toilet. On arrival she was tachycardic (AFib w/RVR), hypoxemic requiring 2 L O2, work up revealing neutrophilic leukocytosis (WBC 13.8k), metabolic acidosis (CO? 20, AG 16) but normal lactate. CXR showed chronic emphysematous and fibrotic changes with bibasilar atelectasis and no focal consolidation. Subsequent CTA revealed extensive bilateral PE's with near-occlusive clot burden in multiple lobar branches. Mild RV/RA dilatation. Heparin  started, echo ordered, and she was admitted to SDU. Ultimately was liberated from oxygen. Hgb stabilized, transitioned to eliquis . Deconditioning will require SNF acute rehabilitation which is pending insurance authorization.   Subjective: Continues to feel stronger without shortness of breath. Had short nose bleed a couple days ago, otherwise denies blood anywhere. No chest pain.   Objective: BP (!) 160/91 (BP Location: Right Arm)   Pulse 98   Temp 97.8 F (36.6 C) (Oral)   Resp 18   Ht 5' 8 (1.727 m)   Wt 76.4 kg   SpO2 91%   BMI 25.61 kg/m   Gen: No distress Pulm: Clear, nonlabored  CV: Irreg irreg, no MRG, no edema GI: Soft, NT, ND, +BS  Neuro: Alert and oriented. No new focal deficits. Ext: Warm, no deformities. Skin: No rashes, lesions or ulcers on visualized skin   Assessment & Plan: Acute, recurrent hypoxemic respiratory failure: Due to PE > AECOPD, lowered pulmonary reserve.  - No longer hypoxemic. Stable for discharge once appropriate venue can be secured. - Treat conditions as below  Bilateral PE's:  - Converted to  eliquis , will continue treatment dose. No bleeding, hgb stable in 10's. Given inconsistent DOAC use, do not feel this represents treatment failure. - Echocardiogram with limited RV views, though IVC appears ok. LVEF 65-70%.  - Given severity of clot burden/submassive/intermediate risk features, but relative mild presentation, ?subacute PE's. Suggest follow up with pulmonary/cardiology for further work up of CTEPH.   Left popliteal vein DVT: Nonocclusive.  - Anticoagulation as above.   AECOPD:  - Continue steroids, total of 5 days to be finished with prednisone   - Continue scheduled and prn bronchodilators - Will plan 5 days abx, though PCT reassuring, no infiltrate, WBC likely related to PE. Can convert to po - Completed tamiflu  for recent influenza, no further isolation/precautions/treatments needed for this.    Dyspepsia:  - Needs GI evaluation, though symptoms improving enough to defer to outpatient setting when more stable.  - Continue PPI, prn tums.   PAF with RVR: Precipitated by conditions above.  - Rate has remained well controlled since shortly after admission. - Continue amiodarone  (may not be best long term option for her with age and lung disease). LFTs ok, TSH is low, will send free T4 (reordered)  - On anticoagulation as above  UTI ruled out: Suspected from pyuria + dysuria. No bacteria on micro.  Generalized weakness: No focal deficits. Deconditioning from recent flu/admission, poor po intake.  - PT/OT recommends SNF which seems appropriate. CSW working diligently on this. She is stable medically to be discharged from the hospital at this time.   History of CVA, HLD: Recent MRI showed remote right cerebellar infarct.   - Continue anticoagulation as above and  statin.   History of breast CA s/p lumpectomy, chemo, radiation: UTD on mammograms per pt with no recurrence.   Diana KATHEE Come, MD [facility] [email or web address] 06/07/2024, 2:12 PM

## 2024-06-08 LAB — CULTURE, BLOOD (ROUTINE X 2)
Culture: NO GROWTH
Culture: NO GROWTH
Special Requests: ADEQUATE
Special Requests: ADEQUATE

## 2024-06-08 MED ORDER — ALBUTEROL SULFATE HFA 108 (90 BASE) MCG/ACT IN AERS: 2.0000 | INHALATION_SPRAY | RESPIRATORY_TRACT | 2 refills | Status: AC | PRN

## 2024-06-08 MED ORDER — APIXABAN 5 MG PO TABS: 5.0000 mg | ORAL_TABLET | Freq: Two times a day (BID) | ORAL | 4 refills | Status: AC

## 2024-06-08 MED ORDER — ELIQUIS DVT/PE STARTER PACK 5 MG PO TBPK: ORAL_TABLET | ORAL | 0 refills | Status: AC

## 2024-06-08 MED ORDER — PANTOPRAZOLE SODIUM 40 MG PO TBEC: 40.0000 mg | DELAYED_RELEASE_TABLET | Freq: Every day | ORAL | 5 refills | Status: AC

## 2024-06-08 NOTE — Discharge Instructions (Signed)
 1)Watch for bleeding while on Blood Thinners--watch for blood in your stool which can make your stool black, maroon, mahogany or red---, blood in your urine which can make your urine pink or red, nosebleeds , also watch for possible bruising -You are taking Apixaban /Eliquis --- which is a blood thinner--- be careful to avoid injury or falls  2)Avoid ibuprofen/Advil/Aleve/Motrin/Goody Powders/Naproxen/BC powders/Meloxicam/Diclofenac /Indomethacin and other Nonsteroidal anti-inflammatory medications as these will make you more likely to bleed and can cause stomach ulcers, can also cause Kidney problems.   3)Repeat CBC and BMP in 1 week

## 2024-06-08 NOTE — Discharge Summary (Signed)
 "                                                                                 [name], is a 79 y.o. female  DOB 1946-01-08  MRN [id number].  Admission date:  06/03/2024  Admitting Physician  Madison DELENA Peaches, MD  Discharge Date:  06/08/2024   Primary MD  Trudy Vaughn FALCON, MD  Recommendations for primary care physician for things to follow:  1)Watch for bleeding while on Blood Thinners--watch for blood in your stool which can make your stool black, maroon, mahogany or red---, blood in your urine which can make your urine pink or red, nosebleeds , also watch for possible bruising -You are taking Apixaban /Eliquis --- which is a blood thinner--- be careful to avoid injury or falls  2)Avoid ibuprofen/Advil/Aleve/Motrin/Goody Powders/Naproxen/BC powders/Meloxicam/Diclofenac /Indomethacin and other Nonsteroidal anti-inflammatory medications as these will make you more likely to bleed and can cause stomach ulcers, can also cause Kidney problems.   3)Repeat CBC and BMP in 1 week   Admission Diagnosis  COPD exacerbation (HCC) [J44.1]   Discharge Diagnosis  COPD exacerbation (HCC) [J44.1]    Principal Problem:   Acute respiratory failure with hypoxia (HCC) Active Problems:   COPD exacerbation (HCC)   Paroxysmal atrial fibrillation with RVR (HCC)   Dyslipidemia      Past Medical History:  Diagnosis Date   Arthritis    Breast cancer (HCC)    left breast/lumpectomy/chemo/rad 2003   COPD (chronic obstructive pulmonary disease) (HCC)    DDD (degenerative disc disease), lumbar 08/31/2017   Diverticulitis    GERD (gastroesophageal reflux disease)    Hemoptysis    Hyperlipidemia    Iron deficiency    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 06/15/2021   Macular degeneration    bilateral   Personal history of radiation therapy 2003   Pulmonary embolism (HCC)    Syncope and collapse 05/17/2021   Tachycardia     Past Surgical History:  Procedure Laterality Date   BACK SURGERY     BREAST  LUMPECTOMY     COLONOSCOPY  01/28/2012   Procedure: COLONOSCOPY;  Surgeon: Claudis RAYMOND Rivet, MD;  Location: [facility];  Service: Endoscopy;  Laterality: N/A;  1200   COLONOSCOPY  01-2012   ESOPHAGOGASTRODUODENOSCOPY N/A 07/01/2013   Procedure: ESOPHAGOGASTRODUODENOSCOPY (EGD);  Surgeon: Claudis RAYMOND Rivet, MD;  Location: [facility];  Service: Endoscopy;  Laterality: N/A;  1:10   LAPAROSCOPIC TUBAL LIGATION  1970   TONSILLECTOMY     Patient was age 56   TOTAL HIP ARTHROPLASTY Left 04/25/2022   Procedure: TOTAL HIP ARTHROPLASTY ANTERIOR APPROACH;  Surgeon: Vernetta Lonni GRADE, MD;  Location: [facility];  Service: Orthopedics;  Laterality: Left;       HPI  from the history and physical done on the day of admission:  [name] is a 79 y.o. Caucasian female with medical history significant for osteoarthritis, COPD, DDD, diverticulitis, paroxysmal atrial fibrillation, GERD, dyslipidemia, pulmonary embolism with inconsistency with compliance to Eliquis  over the last 3 weeks, who was recently admitted here for influenza A and acute respiratory failure with hypoxia and discharged on 12/22, and presents tonight with significant generalized weakness with  inability to get up from her toilet.  She stated that her cough has not improved over the last 3 weeks.  She did not have any significantly worsening dyspnea and denied wheezing.  She is not on oxygen at home.  She admitted to nausea with significant subsequent anorexia.  She denied any vomiting or diarrhea or abdominal pain.  No bleeding diathesis.  No chest pain or palpitations.  She admitted to dysuria yesterday without urinary frequency or urgency or flank pain.  During my interview Pulsoxymeter was 90 to 91% on 2 L of O2 by nasal cannula.   ED Course: When the patient came to the ER, BP was 140/66 with heart rate of 110 and respiratory rate of 23 and pulse symmetry of 88% on 2 L of O2 by nasal cannula and later 90 to 91%.  Later on it went up to 95%  on 2 L.  CMP revealed a CO2 of 20 and blood glucose of 177 with anion gap of 16 with otherwise unremarkable CMP.  CBC showed leukocytosis at 13.8 with neutrophilia.  PT was 14.8 and INR 1.1.  Respiratory panel is currently pending.  Blood cultures were drawn. EKG as reviewed by me : EKG showed atrial fibrillation with rapid ventricular sponsor 121 with PVCs, right bundle branch block and left anterior fascicular block. Imaging: Portable chest x-ray showed the following: 1. No focal consolidation, pleural effusion, or pneumothorax. 2. Mild bibasilar atelectasis. 3. Emphysematous changes and pulmonary fibrosis, similar to prior study.   The patient was given 1 L bolus of IV normal saline, 4 mg of IV Zofran , 125 mg of IV Solu-Medrol  and DuoNeb.  She will be admitted to an observation telemetry bed for further evaluation and management.   Hospital Course:   Assessment and Plan:  1)Acute, recurrent hypoxemic respiratory failure: Due to PE > AECOPD, - -respiratory status improved significantly -No further hypoxia -Post ambulation O2 sats 93 to 95% on room air    2)Bilateral PE's:  - Converted to eliquis , will continue treatment dose. No bleeding, hgb stable in 10's. Given inconsistent DOAC use, do not feel this represents treatment failure. - Echocardiogram with limited RV views, though IVC appears ok. LVEF 65-70%.  - Given severity of clot burden/submassive/intermediate risk features, but relative mild presentation, ?subacute PE's. Suggest follow up with pulmonary/cardiology for further work up of CTEPH as outpatient --Continue treatment with Eliquis  as prescribed   3)Left popliteal vein DVT: Nonocclusive.  - Anticoagulation as above.    4)AECOPD:  -Completed antibiotics and prednisone  -Much improved respiratory status overall - Completed tamiflu  for recent influenza, no further isolation/precautions/treatments needed for this.   --Okay to continue bronchodilator   5)Dyspepsia/GERD:  -  - Protonix  as prescribed -Outpatient GI follow-up advised   6)PAF with RVR: Precipitated by conditions above.  - Rate has remained well controlled since shortly after admission. - Continue amiodarone  (may not be best long term option for her with age and lung disease). LFTs ok, TSH is low,  free T4 WNL - On anticoagulation as above    -Generalized weakness and deconditioning and ambulatory dysfunction persist No focal deficits. Deconditioning from recent flu/admission, poor po intake.  - PT/OT recommends SNF which seems appropriate. C -Discharge to SNF rehab  History of prior CVA, HLD: Recent MRI showed remote right cerebellar infarct.   - Continue anticoagulation as above and statin.    History of breast CA s/p lumpectomy, chemo, radiation: UTD on mammograms per pt with no recurrence.   Discharge Condition:  Stable without hypoxia  Follow UP   Contact information for after-discharge care     Destination     [facility] .   Service: Skilled Nursing Contact information: [address] [place]  [address] [phone]                    Diet and Activity recommendation:  As advised  Discharge Instructions    Discharge Instructions     Call MD for:  persistant dizziness or light-headedness   Complete by: As directed    Call MD for:  persistant nausea and vomiting   Complete by: As directed    Call MD for:  temperature >100.4   Complete by: As directed    Diet - low sodium heart healthy   Complete by: As directed    Discharge instructions   Complete by: As directed    1)Watch for bleeding while on Blood Thinners--watch for blood in your stool which can make your stool black, maroon, mahogany or red---, blood in your urine which can make your urine pink or red, nosebleeds , also watch for possible bruising -You are taking Apixaban /Eliquis --- which is a blood thinner--- be careful to avoid injury or falls  2)Avoid  ibuprofen/Advil/Aleve/Motrin/Goody Powders/Naproxen/BC powders/Meloxicam/Diclofenac /Indomethacin and other Nonsteroidal anti-inflammatory medications as these will make you more likely to bleed and can cause stomach ulcers, can also cause Kidney problems.   3)Repeat CBC and BMP in 1 week   Increase activity slowly   Complete by: As directed        Discharge Medications     Allergies as of 06/08/2024   No Known Allergies      Medication List     STOP taking these medications    apixaban  5 MG Tabs tablet Commonly known as: ELIQUIS  Replaced by: Eliquis  DVT/PE Starter Pack   oseltamivir  75 MG capsule Commonly known as: TAMIFLU    saccharomyces boulardii 250 MG capsule Commonly known as: FLORASTOR   warfarin 2.5 MG tablet Commonly known as: COUMADIN       TAKE these medications    acetaminophen  500 MG tablet Commonly known as: TYLENOL  Take 1 tablet (500 mg total) by mouth every 6 (six) hours as needed for mild pain (pain score 1-3), fever or headache.   albuterol  108 (90 Base) MCG/ACT inhaler Commonly known as: VENTOLIN  HFA Inhale 2 puffs into the lungs every 4 (four) hours as needed for wheezing or shortness of breath. What changed: how much to take   amiodarone  100 MG tablet Commonly known as: PACERONE  Take 100 mg by mouth daily.   atorvastatin  40 MG tablet Commonly known as: LIPITOR Take 40 mg by mouth daily. What changed: Another medication with the same name was removed. Continue taking this medication, and follow the directions you see here.   Eliquis  DVT/PE Starter Pack Generic drug: Apixaban  Starter Pack (10mg  and 5mg ) Take as directed on package: start with two-5mg  tablets twice daily for 7 days. On day 8, switch to one-5mg  tablet twice daily. Replaces: apixaban  5 MG Tabs tablet   apixaban  5 MG Tabs tablet Commonly known as: ELIQUIS  Take 1 tablet (5 mg total) by mouth 2 (two) times daily. Start taking on: July 07, 2024   Fish Oil 1000 MG  Caps Take 1,000 mg by mouth daily.   multivitamin with minerals Tabs tablet Take 1 tablet by mouth daily.   pantoprazole  40 MG tablet Commonly known as: Protonix  Take 1 tablet (40 mg total) by mouth  daily.   PreserVision AREDS 2 Caps Take by mouth 2 (two) times daily.       Major procedures and Radiology Reports - PLEASE review detailed and final reports for all details, in brief -   ECHOCARDIOGRAM COMPLETE Result Date: 06/04/2024    ECHOCARDIOGRAM REPORT   Patient Name:   [name] Date of Exam: 06/04/2024 Medical Rec #:  [id number]      Height:       68.0 in Accession #:    [id number]     Weight:       168.4 lb Date of Birth:  20-Oct-1945      BSA:          1.900 m Patient Age:    78 years       BP:           116/50 mmHg Patient Gender: F              HR:           97 bpm. Exam Location:  [facility] Procedure: 2D Echo, Cardiac Doppler and Color Doppler (Both Spectral and Color            Flow Doppler were utilized during procedure). Indications:    Pulmonary Embolus I26.09  History:        Patient has no prior history of Echocardiogram examinations.                 COPD.  Sonographer:    Jayson Gaskins Referring Phys: [id number] JAN A MANSY  Sonographer Comments: Technically difficult study due to poor echo windows, no parasternal window and suboptimal apical window. Patient could not lay flat. IMPRESSIONS  1. Left ventricular ejection fraction, by estimation, is 65 to 70%. The left ventricle has normal function. The left ventricle has no regional wall motion abnormalities. Left ventricular diastolic function could not be evaluated.  2. Right ventricular systolic function was not well visualized. The right ventricular size is not well visualized. Tricuspid regurgitation signal is inadequate for assessing PA pressure.  3. The mitral valve is degenerative. No evidence of mitral valve regurgitation. No evidence of mitral stenosis. Moderate mitral annular calcification.  4. The aortic valve was not well  visualized. Aortic valve regurgitation is not visualized. No aortic stenosis is present. Aortic valve Vmax measures 1.00 m/s.  5. The inferior vena cava is normal in size with greater than 50% respiratory variability, suggesting right atrial pressure of 3 mmHg. FINDINGS  Left Ventricle: Left ventricular ejection fraction, by estimation, is 65 to 70%. The left ventricle has normal function. The left ventricle has no regional wall motion abnormalities. The left ventricular internal cavity size was normal in size. There is  no left ventricular hypertrophy. Left ventricular diastolic function could not be evaluated. Right Ventricle: The right ventricular size is not well visualized. Right vetricular wall thickness was not assessed. Right ventricular systolic function was not well visualized. Tricuspid regurgitation signal is inadequate for assessing PA pressure. Left Atrium: Left atrial size was normal in size. Right Atrium: Right atrial size was normal in size. Pericardium: There is no evidence of pericardial effusion. Mitral Valve: The mitral valve is degenerative in appearance. Moderate mitral annular calcification. No evidence of mitral valve regurgitation. No evidence of mitral valve stenosis. Tricuspid Valve: The tricuspid valve is normal in structure. Tricuspid valve regurgitation is not demonstrated. No evidence of tricuspid stenosis. Aortic Valve: The aortic valve was not well visualized. Aortic valve regurgitation is not visualized. No aortic  stenosis is present. Aortic valve peak gradient measures 4.0 mmHg. Pulmonic Valve: The pulmonic valve was normal in structure. Pulmonic valve regurgitation is not visualized. No evidence of pulmonic stenosis. Aorta: The aortic root is normal in size and structure. Venous: The inferior vena cava is normal in size with greater than 50% respiratory variability, suggesting right atrial pressure of 3 mmHg. IAS/Shunts: No atrial level shunt detected by color flow Doppler.  LEFT  ATRIUM           Index LA Vol (A4C): 30.5 ml 16.05 ml/m  AORTIC VALVE AV Vmax:      99.70 cm/s AV Peak Grad: 4.0 mmHg LVOT Vmax:    105.00 cm/s LVOT Vmean:   73.400 cm/s LVOT VTI:     0.187 m  SHUNTS Systemic VTI: 0.19 m Wilbert Bihari MD Electronically signed by Wilbert Bihari MD Signature Date/Time: 06/04/2024/4:44:15 PM    Final    US  Venous Img Lower Bilateral (DVT) Result Date: 06/04/2024 CLINICAL DATA:  Pulmonary embolism. Evaluate for lower extremity DVT. EXAM: BILATERAL LOWER EXTREMITY VENOUS DOPPLER ULTRASOUND TECHNIQUE: Gray-scale sonography with graded compression, as well as color Doppler and duplex ultrasound were performed to evaluate the lower extremity deep venous systems from the level of the common femoral vein and including the common femoral, femoral, profunda femoral, popliteal and calf veins including the posterior tibial, peroneal and gastrocnemius veins when visible. Spectral Doppler was utilized to evaluate flow at rest and with distal augmentation maneuvers in the common femoral, femoral and popliteal veins. COMPARISON:  None Available. FINDINGS: RIGHT LOWER EXTREMITY Common Femoral Vein: No evidence of thrombus. Normal compressibility, respiratory phasicity and response to augmentation. Saphenofemoral Junction: No evidence of thrombus. Normal compressibility and flow on color Doppler imaging. Profunda Femoral Vein: No evidence of thrombus. Normal compressibility and flow on color Doppler imaging. Femoral Vein: No evidence of thrombus. Normal compressibility, respiratory phasicity and response to augmentation. Popliteal Vein: No evidence of thrombus. Normal compressibility, respiratory phasicity and response to augmentation. Calf Veins: No evidence of thrombus. Normal compressibility and flow on color Doppler imaging. Other Findings:  Visualized great saphenous vein is patent. LEFT LOWER EXTREMITY Common Femoral Vein: No evidence of thrombus. Normal compressibility, respiratory phasicity and  response to augmentation. Saphenofemoral Junction: No evidence of thrombus. Normal compressibility and flow on color Doppler imaging. Profunda Femoral Vein: No evidence of thrombus. Normal compressibility and flow on color Doppler imaging. Femoral Vein: No evidence of thrombus. Normal compressibility, respiratory phasicity and response to augmentation. Popliteal Vein: Positive for thrombus. Partial compressibility of the left popliteal vein with nonocclusive thrombus. Calf Veins: No evidence of thrombus. Normal compressibility and flow on color Doppler imaging. Other Findings:  Visualized great saphenous vein is patent. IMPRESSION: 1. Positive for nonocclusive thrombus in the left popliteal vein. 2. Negative for deep venous thrombosis in right lower extremity. Electronically Signed   By: Juliene Balder M.D.   On: 06/04/2024 10:58   CT Angio Chest Pulmonary Embolism (PE) W or WO Contrast Result Date: 06/04/2024 EXAM: CTA CHEST 06/04/2024 04:40:37 AM TECHNIQUE: CTA of the chest was performed without and with the administration of 75 mL of intravenous iohexol  (OMNIPAQUE ) 350 MG/ML injection. Multiplanar reformatted images are provided for review. MIP images are provided for review. Automated exposure control, iterative reconstruction, and/or weight based adjustment of the mA/kV was utilized to reduce the radiation dose to as low as reasonably achievable. COMPARISON: CT of the chest dated 10/06/2013. CLINICAL HISTORY: Pulmonary embolism (PE) suspected, low to intermediate prob, neg D-dimer. FINDINGS: PULMONARY  ARTERIES: Pulmonary arteries are adequately opacified for evaluation. There is extensive thromboembolic disease present bilaterally nearly occluding the right upper, right middle and right lower lobe pulmonary arteries. There is also extensive near occlusive disease within the left upper and left lower lobe pulmonary arterial trees. The main pulmonary arteries are dilated and patent proximally. Differential  diagnoses include chronic thromboembolic pulmonary hypertension (CTEPH) and subacute pulmonary embolism. Follow-up guidelines include considering pulmonary function tests, right heart catheterization, and/or V/Q scan for further evaluation of pulmonary hypertension. Clinical correlation is recommended. MEDIASTINUM: There is mild dilatation of the right ventricular chamber and the right atrium. There is calcific atheromatous disease within the coronary arteries and thoracic aorta. There is a cardiac loop monitor within the left anterior chest wall. LYMPH NODES: No mediastinal, hilar or axillary lymphadenopathy. LUNGS AND PLEURA: There is mild-to-moderate centrilobular emphysema. There are patchy ground glass opacities present posteriorly within the lung apices. There is mild dependent atelectasis within the lower lobes bilaterally. No evidence of pleural effusion or pneumothorax. UPPER ABDOMEN: Limited images of the upper abdomen are unremarkable. SOFT TISSUES AND BONES: No acute bone or soft tissue abnormality. IMPRESSION: 1. Extensive bilateral pulmonary arterial thromboembolism involving the right upper, right middle, right lower, left upper, and left lower lobe pulmonary arteries, with near occlusion in multiple branches. 2. Mild dilatation of the right ventricular chamber and right atrium, concerning for right heart strain; recommend clinical correlation and consideration of echocardiography. 3. Mild-to-moderate centrilobular emphysema. 4. Patchy posterior apical opacities and mild dependent lower lobe atelectasis; differential includes atelectasis, aspiration/infection, or pulmonary infarcts in the setting of pulmonary emboli. 5. The above findings were discussed with Dr. Geroldine at 4:57 AM 06/04/24. Electronically signed by: Evalene Coho MD 06/04/2024 04:58 AM EST RP Workstation: [id number]   DG Chest Port 1 View Result Date: 06/03/2024 EXAM: 1 VIEW(S) XRAY OF THE CHEST 06/03/2024 11:10:11 PM COMPARISON:  Comparison with 05/22/2024. CLINICAL HISTORY: Questionable sepsis - evaluate for abnormality. Nausea, vomiting, diarrhea, shortness of breath. FINDINGS: LINES, TUBES AND DEVICES: A loop recorder is present. LUNGS AND PLEURA: Shallow inspiration. Emphysematous changes and fibrosis in the lungs. Atelectasis in the lung bases. Scarring in the lung apices with pleural thickening in the left apex. No focal consolidation. No pleural effusion. No pneumothorax. Appearances are similar to prior study. HEART AND MEDIASTINUM: Mediastinal contours appear intact. Calcification of the aorta. BONES AND SOFT TISSUES: Surgical clips in the left axilla. Degenerative changes in the spine. No acute osseous abnormality. IMPRESSION: 1. No focal consolidation, pleural effusion, or pneumothorax. 2. Mild bibasilar atelectasis. 3. Emphysematous changes and pulmonary fibrosis, similar to prior study. Electronically signed by: Elsie Gravely MD 06/03/2024 11:41 PM EST RP Workstation: [id number]   DG Chest 1 View Result Date: 05/22/2024 CLINICAL DATA:  Shortness of breath. EXAM: CHEST  1 VIEW COMPARISON:  Radiograph 12/20/2015. CT 10/06/2013 FINDINGS: Normal heart size with stable mediastinal contours. Aortic atherosclerosis. Right lung base opacity due to prominent epicardial fat pad. Chronic interstitial coarsening. No acute airspace disease. Left chest wall loop recorder. No pulmonary edema. No pleural effusion or pneumothorax. Left axillary surgical clips. On limited assessment, no acute osseous findings. IMPRESSION: Chronic interstitial coarsening. No acute findings. Electronically Signed   By: Andrea Gasman M.D.   On: 05/22/2024 13:56   CT Head Wo Contrast Result Date: 05/22/2024 CLINICAL DATA:  Left leg weakness. EXAM: CT HEAD WITHOUT CONTRAST TECHNIQUE: Contiguous axial images were obtained from the base of the skull through the vertex without intravenous contrast. RADIATION DOSE REDUCTION: This exam  was performed according  to the departmental dose-optimization program which includes automated exposure control, adjustment of the mA and/or kV according to patient size and/or use of iterative reconstruction technique. COMPARISON:  Brain MRI earlier today, head CT 04/20/2024 FINDINGS: Brain: No intracranial hemorrhage, mass effect, or midline shift. Stable degree of atrophy ventricular dilatation. Again seen chronic small vessel ischemic change. The basilar cisterns are patent. No evidence of territorial infarct or acute ischemia. Small remote right cerebellar infarct. No extra-axial or intracranial fluid collection. Vascular: Atherosclerosis of skullbase vasculature without hyperdense vessel or abnormal calcification. Skull: No fracture or focal lesion. Sinuses/Orbits: Chronic opacification of a few left ethmoid air cells. Partial opacification of left mastoid air cells. Bilateral lens resection. Other: None. IMPRESSION: 1. No acute intracranial abnormality. 2. Stable atrophy and chronic small vessel ischemic change. Remote right cerebellar lacunar infarct. 3. Stable ventriculomegaly. Electronically Signed   By: Andrea Gasman M.D.   On: 05/22/2024 13:53   MR LUMBAR SPINE WO CONTRAST Result Date: 05/22/2024 EXAM: MRI LUMBAR SPINE 05/22/2024 01:34:00 PM TECHNIQUE: Multiplanar multisequence MRI of the lumbar spine was performed without the administration of intravenous contrast. COMPARISON: MRI of the lumbar spine 08/52. CLINICAL HISTORY: left leg weakness, lower back pain FINDINGS: BONES AND ALIGNMENT: Slight degenerative retrolisthesis at L1-L2 and L2-L3 is stable. Levoconvex curvature is similar to the prior study, centered at L3. Normal vertebral body heights. Bone marrow signal is unremarkable, except for edematous endplate marrow changes at L5-S1 which are new suggesting aggressive loss of disc height. SPINAL CORD: The conus terminates normally. SOFT TISSUES: No paraspinal mass. L1-L2: Mild disc bulging and facet hypertrophy,  stable without significant stenosis or change. L2-L3: Mild disc bulging and facet hypertrophy, stable without significant stenosis or change. L3-L4: Mild disc bulging and facet hypertrophy, stable without focal stenosis. L4-L5: A rightward disc protrusion is present, similar to prior study. Mild facet hypertrophy is present bilaterally. No focal stenosis is present. L5-S1: Chronic loss of disc height is present without focal protrusion or stenosis. Edematous endplate marrow changes at L5-S1 are new suggesting aggressive loss of disc height. IMPRESSION: 1. No acute or focal abnormality to explain leg weakness. 2. Slight progression of multilevel endplate degenerative changes as described. Electronically signed by: Lonni Necessary MD 05/22/2024 01:50 PM EST RP Workstation: [id number]   MR BRAIN WO CONTRAST Result Date: 05/22/2024 EXAM: MRI BRAIN WITHOUT CONTRAST 05/22/2024 01:21:47 PM TECHNIQUE: Multiplanar multisequence MRI of the head/brain was performed without the administration of intravenous contrast. COMPARISON: Numerous head without and with contrast 11/26/2021. CT head without contrast 04/20/1924. CLINICAL HISTORY: Fall. Nausea and dizziness. Left lower extremity weakness. FINDINGS: BRAIN AND VENTRICLES: Moderate generalized atrophy and white matter changes are similar to the prior study. Ventricles are enlarged. The callosal angle is 120 degrees. 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 No acute infarct. No intracranial hemorrhage. No mass. No midline shift. No hydrocephalus. The sella is unremarkable. Normal flow voids. ORBITS: Bilateral lens replacements are noted. The globes and orbits are otherwise within normal limits. SINUSES AND MASTOIDS: No left mastoid effusion is present. No obstructing nasopharyngeal lesion is present. BONES AND SOFT TISSUES: Normal marrow signal. No acute soft tissue abnormality. IMPRESSION: 1. No acute intracranial abnormality to  explain the left leg weakness. 2. 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 3. Moderate generalized atrophy and white matter changes, similar to the prior study. 4. Stable ventricular enlargement not to the degree of atrophy. Electronically signed by: Lonni Necessary MD 05/22/2024  01:28 PM EST RP Workstation: [id number]    Micro Results   Recent Results (from the past 240 hours)  Resp panel by RT-PCR (RSV, Flu A&B, Covid) Anterior Nasal Swab     Status: None   Collection Time: 06/03/24 10:57 PM   Specimen: Anterior Nasal Swab  Result Value Ref Range Status   SARS Coronavirus 2 by RT PCR NEGATIVE NEGATIVE Final    Comment: (NOTE) SARS-CoV-2 target nucleic acids are NOT DETECTED.  The SARS-CoV-2 RNA is generally detectable in upper respiratory specimens during the acute phase of infection. The lowest concentration of SARS-CoV-2 viral copies this assay can detect is 138 copies/mL. A negative result does not preclude SARS-Cov-2 infection and should not be used as the sole basis for treatment or other patient management decisions. A negative result may occur with  improper specimen collection/handling, submission of specimen other than nasopharyngeal swab, presence of viral mutation(s) within the areas targeted by this assay, and inadequate number of viral copies(<138 copies/mL). A negative result must be combined with clinical observations, patient history, and epidemiological information. The expected result is Negative.  Fact Sheet for Patients:  bloggercourse.com  Fact Sheet for Healthcare Providers:  seriousbroker.it  This test is no t yet approved or cleared by the [place]  FDA and  has been authorized for detection and/or diagnosis of SARS-CoV-2 by FDA under an Emergency Use Authorization (EUA). This EUA will remain  in effect (meaning this test can be used) for the duration of the COVID-19  declaration under Section 564(b)(1) of the Act, 21 U.S.C.section 360bbb-3(b)(1), unless the authorization is terminated  or revoked sooner.       Influenza A by PCR NEGATIVE NEGATIVE Final   Influenza B by PCR NEGATIVE NEGATIVE Final    Comment: (NOTE) The Xpert Xpress SARS-CoV-2/FLU/RSV plus assay is intended as an aid in the diagnosis of influenza from Nasopharyngeal swab specimens and should not be used as a sole basis for treatment. Nasal washings and aspirates are unacceptable for Xpert Xpress SARS-CoV-2/FLU/RSV testing.  Fact Sheet for Patients: bloggercourse.com  Fact Sheet for Healthcare Providers: seriousbroker.it  This test is not yet approved or cleared by the [place]  FDA and has been authorized for detection and/or diagnosis of SARS-CoV-2 by FDA under an Emergency Use Authorization (EUA). This EUA will remain in effect (meaning this test can be used) for the duration of the COVID-19 declaration under Section 564(b)(1) of the Act, 21 U.S.C. section 360bbb-3(b)(1), unless the authorization is terminated or revoked.     Resp Syncytial Virus by PCR NEGATIVE NEGATIVE Final    Comment: (NOTE) Fact Sheet for Patients: bloggercourse.com  Fact Sheet for Healthcare Providers: seriousbroker.it  This test is not yet approved or cleared by the [place]  FDA and has been authorized for detection and/or diagnosis of SARS-CoV-2 by FDA under an Emergency Use Authorization (EUA). This EUA will remain in effect (meaning this test can be used) for the duration of the COVID-19 declaration under Section 564(b)(1) of the Act, 21 U.S.C. section 360bbb-3(b)(1), unless the authorization is terminated or revoked.  Performed at [facility], [address], [place], [place] [address]   Blood Culture (routine x 2)     Status: None   Collection Time: 06/03/24 11:02 PM   Specimen:  BLOOD RIGHT ARM  Result Value Ref Range Status   Specimen Description BLOOD RIGHT ARM  Final   Special Requests AEROBIC BOTTLE ONLY Blood Culture adequate volume  Final   Culture   Final  NO GROWTH 5 DAYS Performed at [facility], [address], [place], [place] [address]    Report Status 06/08/2024 FINAL  Final  Blood Culture (routine x 2)     Status: None   Collection Time: 06/03/24 11:09 PM   Specimen: BLOOD LEFT HAND  Result Value Ref Range Status   Specimen Description BLOOD LEFT HAND  Final   Special Requests AEROBIC BOTTLE ONLY Blood Culture adequate volume  Final   Culture   Final    NO GROWTH 5 DAYS Performed at [facility], [address], [place], [place] [address]    Report Status 06/08/2024 FINAL  Final  Urine Culture     Status: Abnormal   Collection Time: 06/04/24  4:50 AM   Specimen: Urine, Random  Result Value Ref Range Status   Specimen Description   Final    URINE, RANDOM Performed at [facility], [address] [place], [place] [address]    Special Requests   Final    NONE Reflexed from [id number] Performed at [facility], [address] [place], [place] [address]    Culture (A)  Final    <10,000 COLONIES/mL INSIGNIFICANT GROWTH Performed at [facility], [address] [place], [place] [address]    Report Status 06/05/2024 FINAL  Final  MRSA Next Gen by PCR, Nasal     Status: None   Collection Time: 06/04/24  6:10 AM   Specimen: Nasal Mucosa; Nasal Swab  Result Value Ref Range Status   MRSA by PCR Next Gen NOT DETECTED NOT DETECTED Final    Comment: (NOTE) The GeneXpert MRSA Assay (FDA approved for NASAL specimens only), is one component of a comprehensive MRSA colonization surveillance program. It is not intended to diagnose MRSA infection nor to guide or monitor treatment for MRSA infections. Test performance is not FDA approved in patients less than 12 years old. Performed at [facility], [address], [place], [place] [address]      Today   Subjective    Asia Dusenbury today has no new complaints No fever  Or chills   No Nausea, Vomiting or Diarrhea =-Husband at bedside, questions answered -- Respiratory status improved significantly, no further hypoxia - Generalized weakness and deconditioning and ambulatory dysfunction persist      Patient has been seen and examined prior to discharge   Objective   Blood pressure 136/77, pulse 84, temperature 98 F (36.7 C), temperature source Oral, resp. rate 18, height 5' 8 (1.727 m), weight 76.4 kg, SpO2 93%.   Intake/Output Summary (Last 24 hours) at 06/08/2024 1149 Last data filed at 06/08/2024 0903 Gross per 24 hour  Intake 240 ml  Output --  Net 240 ml    Exam Gen:- Awake Alert, no acute distress , no conversational dyspnea HEENT:- [place].AT, No sclera icterus Neck-Supple Neck,No JVD,.  Lungs-  CTAB , good air movement bilaterally CV- S1, S2 normal, irregular Abd-  +ve B.Sounds, Abd Soft, No tenderness,    Extremity/Skin:- No  edema,   good pulses Psych-affect is appropriate, oriented x3 Neuro-generalized weakness, no new focal deficits, no tremors    Data Review   CBC w Diff:  Lab Results  Component Value Date   WBC 13.3 (H) 06/06/2024   HGB 11.2 (L) 06/07/2024   HCT 34.1 (L) 06/07/2024   PLT 268 06/06/2024   LYMPHOPCT 11 06/03/2024   MONOPCT 5 06/03/2024   EOSPCT 0 06/03/2024   BASOPCT 0 06/03/2024    CMP:  Lab Results  Component Value Date   NA 139 06/05/2024   K 4.5 06/05/2024   CL 107 06/05/2024   CO2 24 06/05/2024   BUN 18 06/05/2024   CREATININE 0.59 06/05/2024   CREATININE 0.58 (L) 11/01/2019   PROT 5.6 (L) 06/05/2024   ALBUMIN 3.2 (L) 06/05/2024   BILITOT <0.2 06/05/2024   ALKPHOS 73 06/05/2024   AST 15 06/05/2024   ALT 17 06/05/2024  .  Total Discharge time is about 33 minutes  Rendall Carwin M.D on 06/08/2024 at 11:49 AM  Go to [email or web address] -  for contact info  [facility] - Office  [phone]   "

## 2024-06-08 NOTE — Progress Notes (Addendum)
" °   06/08/24 1241  Hand-off documentation  Hand-off Given Given to Transfer Unit/facility  Report given to (Full Name) Emilia LPN at [facility]  Report received from (Full Name) Philippe RN    Meridith R SW will Call EMS for transport to [facility] "

## 2024-06-08 NOTE — TOC Transition Note (Signed)
 Transition of Care [facility]) - Discharge Note   Patient Details  Name: [name] MRN: [id number] Date of Birth: July 11, 1945  [facility] CM/SW Contact:  [name], LCSW Phone Number: 06/08/2024, 12:27 PM   Clinical Narrative:    Pt to discharge to [facility] for STR. Pt going to room 202. RN to call report to [phone]. Medical necessity form printed to the unit. [facility] called at 12:33pm.   Final next level of care: Skilled Nursing Facility Barriers to Discharge: Barriers Resolved   Patient Goals and CMS Choice Patient states their goals for this hospitalization and ongoing recovery are:: get stronger CMS Medicare.gov Compare Post Acute Care list provided to:: Patient Choice offered to / list presented to : Patient [place] ownership interest in [facility].provided to:: Patient    Discharge Placement   Existing PASRR number confirmed : 06/07/24          Patient chooses bed at: [facility] Patient to be transferred to facility by: [facility] Name of family member notified: patient/spouse Patient and family notified of of transfer: 06/08/24  Discharge Plan and Services Additional resources added to the After Visit Summary for   In-house Referral: Clinical Social Work Discharge Planning Services: CM Consult Post Acute Care Choice: Skilled Nursing Facility          DME Arranged: N/A DME Agency: NA                  Social Drivers of Health (SDOH) Interventions SDOH Screenings   Food Insecurity: No Food Insecurity (06/04/2024)  Housing: Low Risk (06/04/2024)  Transportation Needs: No Transportation Needs (06/04/2024)  Utilities: Not At Risk (06/04/2024)  Social Connections: Unknown (06/04/2024)  Recent Concern: Social Connections - Socially Isolated (05/22/2024)  Tobacco Use: High Risk (06/03/2024)     Readmission Risk Interventions    06/08/2024   12:25 PM 06/06/2024   12:06 PM  Readmission Risk Prevention Plan  Post Dischage Appt  Complete   Medication Screening Complete   Transportation Screening Complete Complete  Home Care Screening  Complete  Medication Review (RN CM)  Complete

## 2024-08-03 ENCOUNTER — Institutional Professional Consult (permissible substitution): Admitting: Diagnostic Neuroimaging
